# Patient Record
Sex: Male | Born: 1955 | Race: White | Hispanic: No | Marital: Married | Smoking: Never smoker
Health system: Southern US, Community
[De-identification: ages and names within clinical notes are randomized; demographics above are authoritative.]

## PROBLEM LIST (undated history)

## (undated) DIAGNOSIS — I82409 Acute embolism and thrombosis of unspecified deep veins of unspecified lower extremity: Secondary | ICD-10-CM

## (undated) DIAGNOSIS — N183 Chronic kidney disease, stage 3 unspecified: Secondary | ICD-10-CM

## (undated) DIAGNOSIS — I10 Essential (primary) hypertension: Secondary | ICD-10-CM

## (undated) DIAGNOSIS — G4700 Insomnia, unspecified: Secondary | ICD-10-CM

## (undated) DIAGNOSIS — T82898A Other specified complication of vascular prosthetic devices, implants and grafts, initial encounter: Secondary | ICD-10-CM

## (undated) DIAGNOSIS — N529 Male erectile dysfunction, unspecified: Secondary | ICD-10-CM

## (undated) DIAGNOSIS — E785 Hyperlipidemia, unspecified: Secondary | ICD-10-CM

## (undated) DIAGNOSIS — E119 Type 2 diabetes mellitus without complications: Secondary | ICD-10-CM

## (undated) DIAGNOSIS — D649 Anemia, unspecified: Secondary | ICD-10-CM

## (undated) DIAGNOSIS — N186 End stage renal disease: Secondary | ICD-10-CM

## (undated) DIAGNOSIS — H269 Unspecified cataract: Secondary | ICD-10-CM

## (undated) DIAGNOSIS — E669 Obesity, unspecified: Secondary | ICD-10-CM

## (undated) DIAGNOSIS — F32A Depression, unspecified: Secondary | ICD-10-CM

## (undated) HISTORY — DX: Essential (primary) hypertension: I10

## (undated) HISTORY — DX: Obesity, unspecified: E66.9

## (undated) HISTORY — DX: Chronic kidney disease, stage 3 unspecified: N18.30

## (undated) HISTORY — DX: Male erectile dysfunction, unspecified: N52.9

## (undated) HISTORY — PX: SPINE SURGERY: SHX786

## (undated) HISTORY — DX: Type 2 diabetes mellitus without complications: E11.9

## (undated) HISTORY — DX: Chronic kidney disease, stage 3 (moderate): N18.3

## (undated) HISTORY — DX: Insomnia, unspecified: G47.00

## (undated) HISTORY — DX: Unspecified cataract: H26.9

## (undated) HISTORY — DX: Hyperlipidemia, unspecified: E78.5

---

## 2000-03-29 ENCOUNTER — Encounter (INDEPENDENT_AMBULATORY_CARE_PROVIDER_SITE_OTHER): Payer: Self-pay | Admitting: *Deleted

## 2000-03-29 ENCOUNTER — Ambulatory Visit (HOSPITAL_COMMUNITY): Admission: RE | Admit: 2000-03-29 | Discharge: 2000-03-29 | Payer: Self-pay | Admitting: *Deleted

## 2001-06-04 ENCOUNTER — Encounter: Admission: RE | Admit: 2001-06-04 | Discharge: 2001-07-06 | Payer: Self-pay | Admitting: Orthopedic Surgery

## 2001-09-03 ENCOUNTER — Encounter: Admission: RE | Admit: 2001-09-03 | Discharge: 2001-09-24 | Payer: Self-pay | Admitting: Orthopedic Surgery

## 2002-01-31 HISTORY — PX: KNEE SURGERY: SHX244

## 2005-07-20 ENCOUNTER — Ambulatory Visit: Payer: Self-pay | Admitting: Cardiology

## 2005-07-26 ENCOUNTER — Ambulatory Visit: Payer: Self-pay

## 2007-04-26 ENCOUNTER — Ambulatory Visit (HOSPITAL_COMMUNITY): Admission: RE | Admit: 2007-04-26 | Discharge: 2007-04-26 | Payer: Self-pay | Admitting: Ophthalmology

## 2008-01-17 ENCOUNTER — Ambulatory Visit (HOSPITAL_COMMUNITY): Admission: RE | Admit: 2008-01-17 | Discharge: 2008-01-17 | Payer: Self-pay | Admitting: Ophthalmology

## 2009-03-05 ENCOUNTER — Observation Stay (HOSPITAL_COMMUNITY): Admission: EM | Admit: 2009-03-05 | Discharge: 2009-03-06 | Payer: Self-pay | Admitting: Emergency Medicine

## 2009-03-20 ENCOUNTER — Encounter: Admission: RE | Admit: 2009-03-20 | Discharge: 2009-03-20 | Payer: Self-pay | Admitting: Neurological Surgery

## 2009-04-09 ENCOUNTER — Encounter (INDEPENDENT_AMBULATORY_CARE_PROVIDER_SITE_OTHER): Payer: Self-pay | Admitting: Neurological Surgery

## 2009-04-09 ENCOUNTER — Ambulatory Visit (HOSPITAL_COMMUNITY): Admission: RE | Admit: 2009-04-09 | Discharge: 2009-04-10 | Payer: Self-pay | Admitting: Neurological Surgery

## 2009-05-18 ENCOUNTER — Encounter: Admission: RE | Admit: 2009-05-18 | Discharge: 2009-05-18 | Payer: Self-pay | Admitting: Neurological Surgery

## 2009-07-13 ENCOUNTER — Encounter: Admission: RE | Admit: 2009-07-13 | Discharge: 2009-07-13 | Payer: Self-pay | Admitting: Neurological Surgery

## 2009-09-30 ENCOUNTER — Encounter
Admission: RE | Admit: 2009-09-30 | Discharge: 2009-12-29 | Payer: Self-pay | Source: Home / Self Care | Admitting: Neurological Surgery

## 2009-11-16 ENCOUNTER — Encounter: Admission: RE | Admit: 2009-11-16 | Discharge: 2009-11-16 | Payer: Self-pay | Admitting: Neurological Surgery

## 2010-04-26 LAB — CBC
MCHC: 34.2 g/dL (ref 30.0–36.0)
MCV: 85.9 fL (ref 78.0–100.0)
Platelets: 293 10*3/uL (ref 150–400)
RDW: 14.1 % (ref 11.5–15.5)

## 2010-04-26 LAB — SURGICAL PCR SCREEN
MRSA, PCR: NEGATIVE
Staphylococcus aureus: NEGATIVE

## 2010-04-26 LAB — COMPREHENSIVE METABOLIC PANEL
AST: 23 U/L (ref 0–37)
Albumin: 3.9 g/dL (ref 3.5–5.2)
BUN: 18 mg/dL (ref 6–23)
Calcium: 9.3 mg/dL (ref 8.4–10.5)
Creatinine, Ser: 1.22 mg/dL (ref 0.4–1.5)
GFR calc Af Amer: >60 mL/min (ref >60)

## 2010-04-26 LAB — GLUCOSE, CAPILLARY
Glucose-Capillary: 205 mg/dL — ABNORMAL HIGH (ref 70–99)
Glucose-Capillary: 206 mg/dL — ABNORMAL HIGH (ref 70–99)
Glucose-Capillary: 255 mg/dL — ABNORMAL HIGH (ref 70–99)

## 2010-04-26 LAB — DIFFERENTIAL
Eosinophils Relative: 3 % (ref 0–5)
Lymphocytes Relative: 19 % (ref 12–46)
Lymphs Abs: 1.7 10*3/uL (ref 0.7–4.0)
Monocytes Absolute: 0.9 10*3/uL (ref 0.1–1.0)
Neutro Abs: 6 10*3/uL (ref 1.7–7.7)

## 2010-04-26 LAB — PROTIME-INR: Prothrombin Time: 13.6 seconds (ref 11.6–15.2)

## 2010-04-26 LAB — TYPE AND SCREEN: Antibody Screen: NEGATIVE

## 2010-04-26 LAB — APTT: aPTT: 29 seconds (ref 24–37)

## 2010-06-11 ENCOUNTER — Other Ambulatory Visit: Payer: Self-pay | Admitting: Gastroenterology

## 2010-10-25 LAB — BASIC METABOLIC PANEL
BUN: 18
CO2: 28
Chloride: 103
GFR calc non Af Amer: >60
Glucose, Bld: 127 — ABNORMAL HIGH
Potassium: 3.9
Sodium: 138

## 2010-11-05 LAB — BASIC METABOLIC PANEL
CO2: 28 mEq/L (ref 19–32)
Calcium: 9.9 mg/dL (ref 8.4–10.5)
Chloride: 105 mEq/L (ref 96–112)
GFR calc Af Amer: >60 mL/min (ref >60)
Glucose, Bld: 97 mg/dL (ref 70–99)
Potassium: 4.6 mEq/L (ref 3.5–5.1)
Sodium: 140 mEq/L (ref 135–145)

## 2010-11-05 LAB — HEMOGLOBIN AND HEMATOCRIT, BLOOD: HCT: 32 % — ABNORMAL LOW (ref 39.0–52.0)

## 2010-11-05 LAB — GLUCOSE, CAPILLARY: Glucose-Capillary: 153 mg/dL — ABNORMAL HIGH (ref 70–99)

## 2011-12-21 IMAGING — CR DG LUMBAR SPINE 2-3V
3 series · 3 of 3 positions shown · non-contrast
Comparison: 07/13/2009.

CLINICAL DATA: Low back pain.  Follow-up lumbar surgery.

LUMBAR SPINE - 2-3 VIEW

[t l-spine a.p. *]
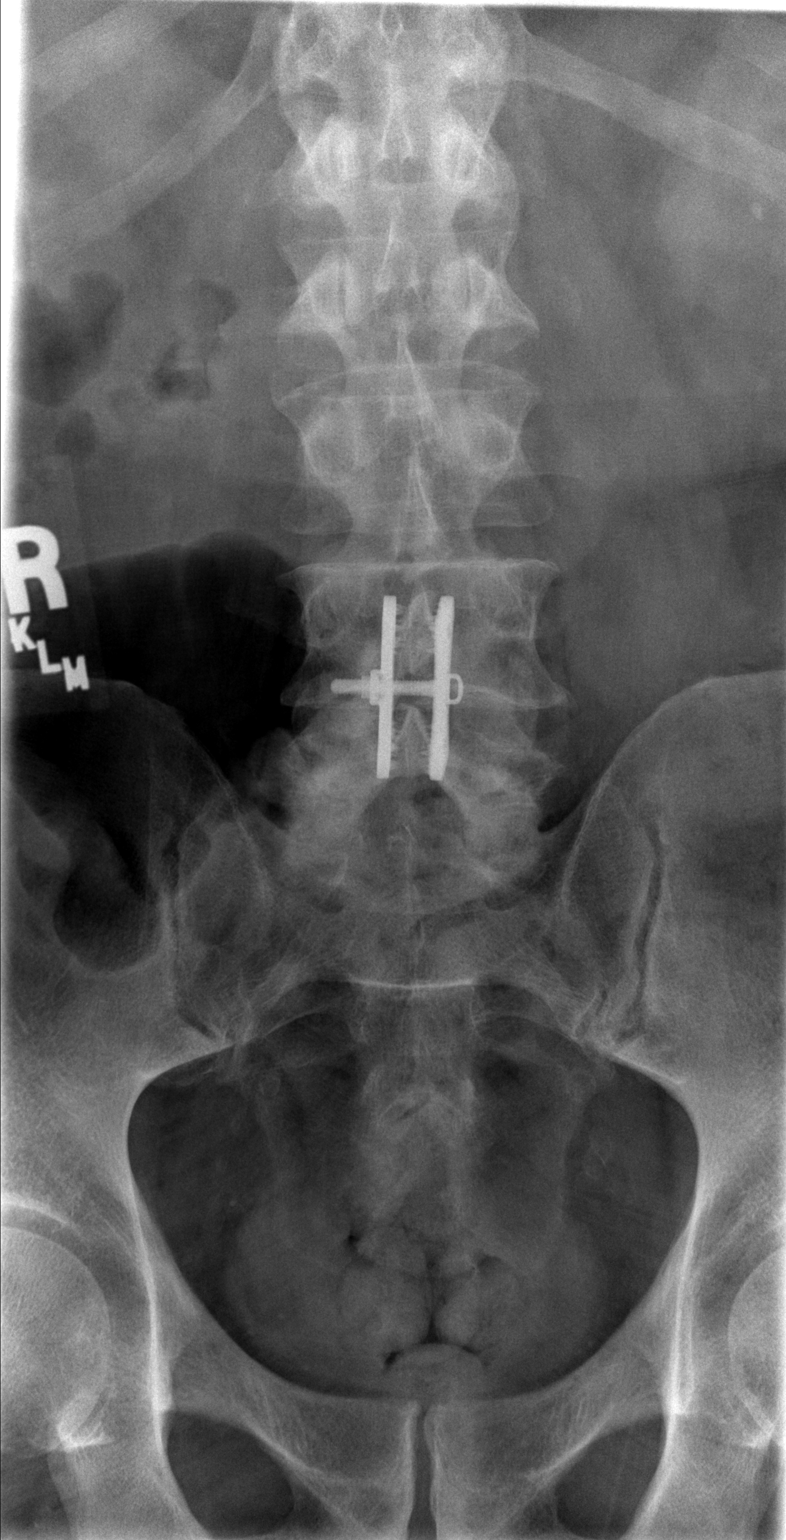

[t l-spine lat *]
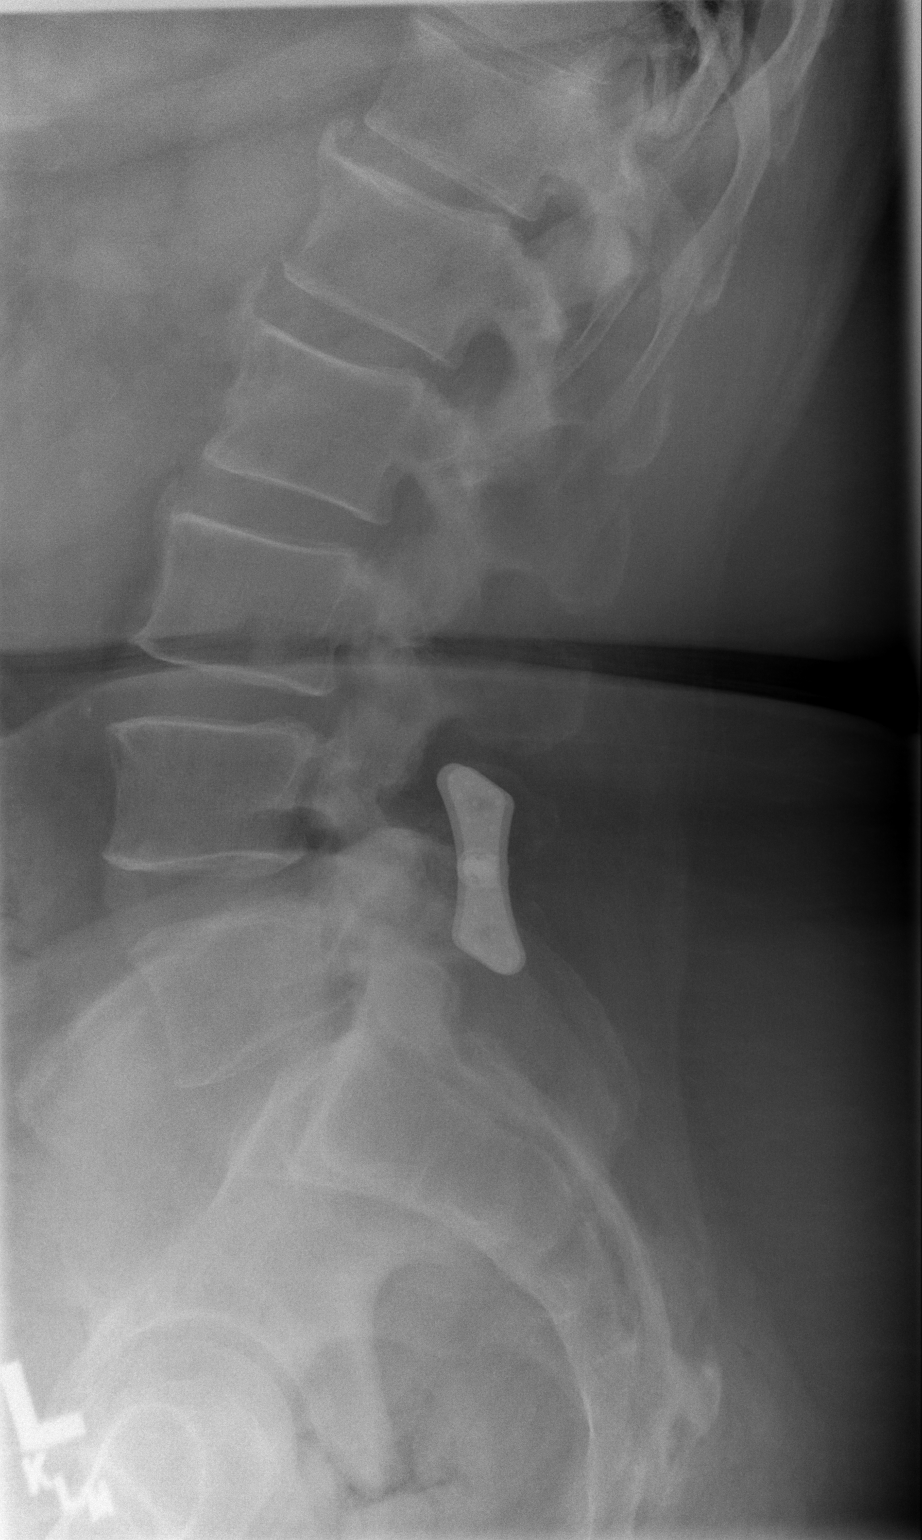

[t l-spine l5-s1 spot *]
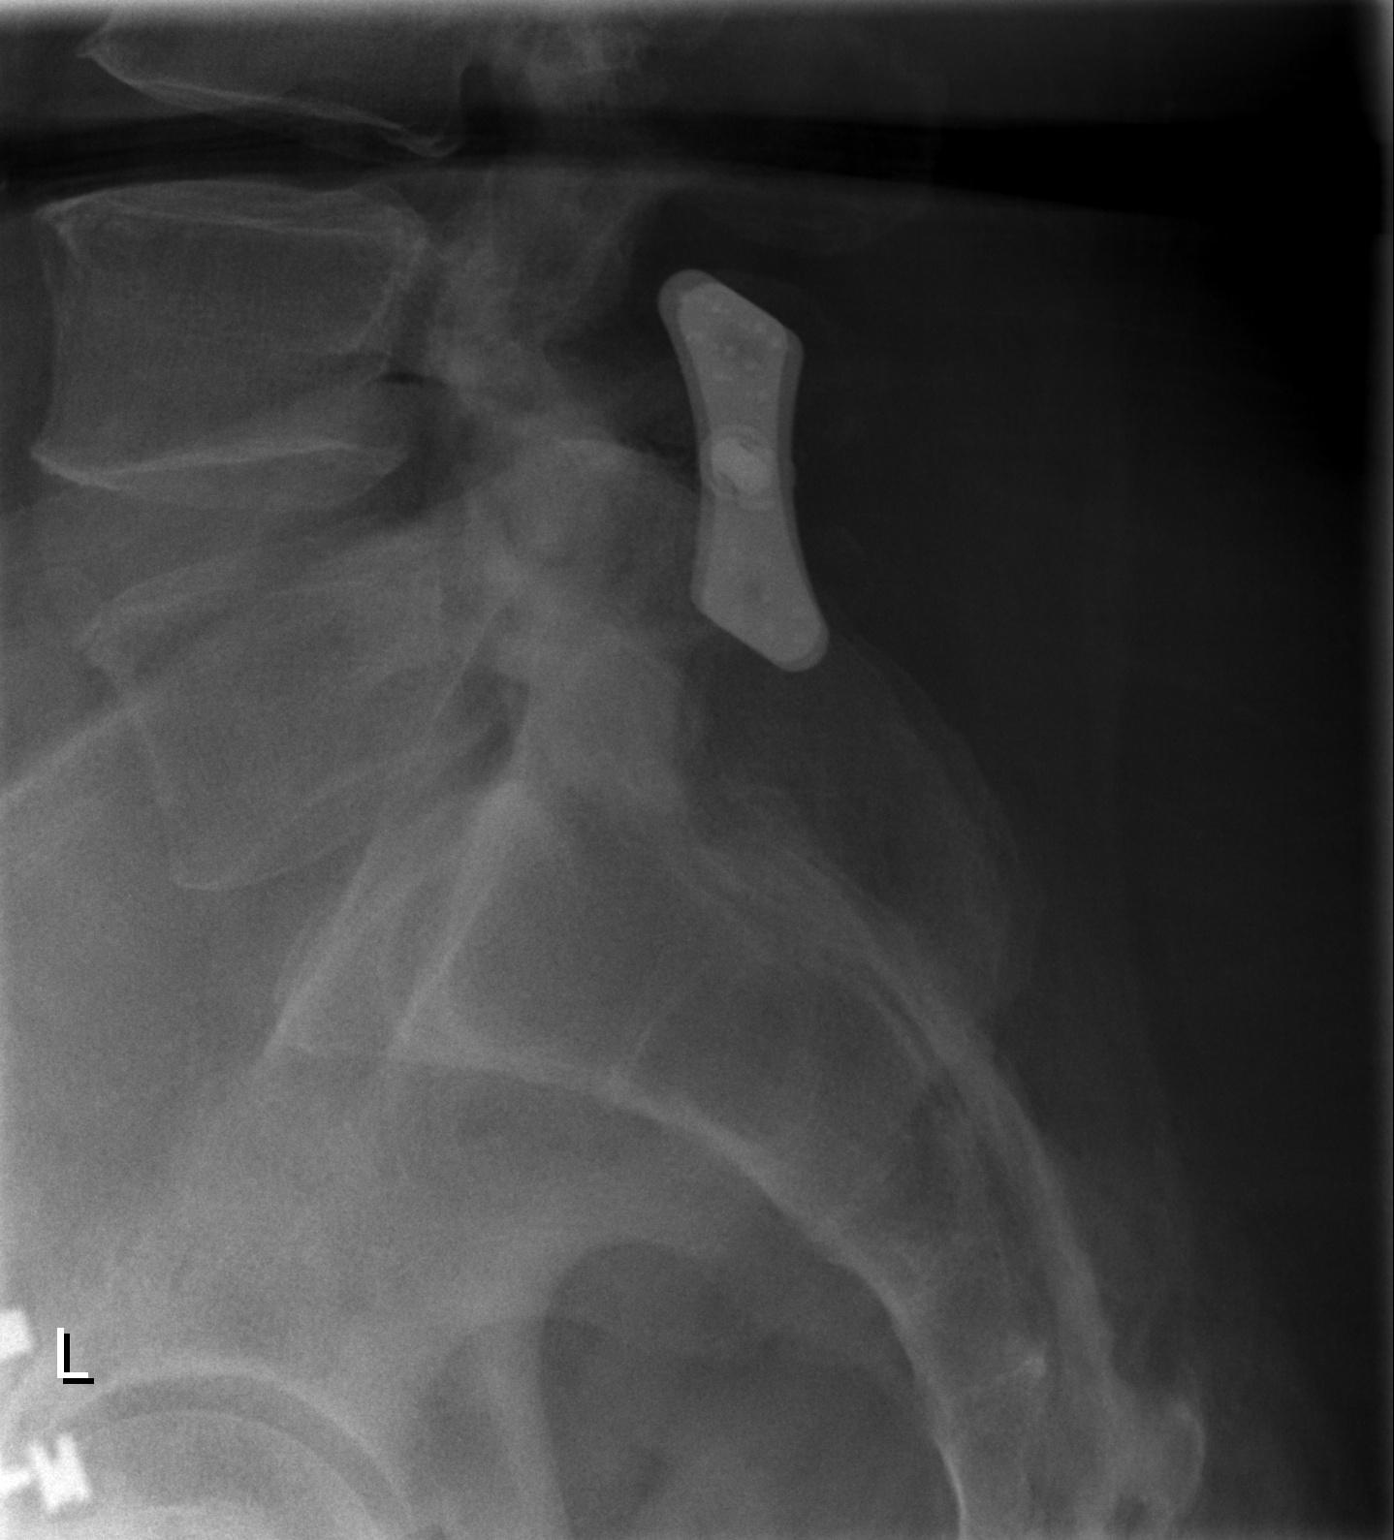

[3 of 3 positions shown; findings below may reference images not displayed]

FINDINGS: Again seen is the posterior fusion at the L4-5 level with
X- spot device present associated with the spinous processes at
this level.  The hardware appears intact and in satisfactory
position.  Alignment appears satisfactory and unchanged.

There is a small (4 mm) left lower pole left renal calculus
present.
IMPRESSION: Stable appearance postoperatively as discussed above.  Small (4 mm)
lower pole left renal calculus.

## 2012-07-09 ENCOUNTER — Other Ambulatory Visit: Payer: Self-pay | Admitting: *Deleted

## 2012-07-09 MED ORDER — CYCLOBENZAPRINE HCL 10 MG PO TABS: ORAL_TABLET | ORAL | Status: DC

## 2012-07-09 NOTE — Telephone Encounter (Signed)
LAST RF 05/03/12. THANKS.

## 2012-08-06 ENCOUNTER — Encounter: Payer: Self-pay | Admitting: Family Medicine

## 2012-08-06 ENCOUNTER — Ambulatory Visit (INDEPENDENT_AMBULATORY_CARE_PROVIDER_SITE_OTHER): Payer: PRIVATE HEALTH INSURANCE | Admitting: Family Medicine

## 2012-08-06 VITALS — BP 136/68 | HR 67 | Temp 98.0°F | Ht 71.0 in | Wt 341.2 lb

## 2012-08-06 DIAGNOSIS — N189 Chronic kidney disease, unspecified: Secondary | ICD-10-CM | POA: Insufficient documentation

## 2012-08-06 DIAGNOSIS — G4700 Insomnia, unspecified: Secondary | ICD-10-CM | POA: Insufficient documentation

## 2012-08-06 DIAGNOSIS — R635 Abnormal weight gain: Secondary | ICD-10-CM

## 2012-08-06 DIAGNOSIS — I10 Essential (primary) hypertension: Secondary | ICD-10-CM | POA: Insufficient documentation

## 2012-08-06 DIAGNOSIS — M549 Dorsalgia, unspecified: Secondary | ICD-10-CM

## 2012-08-06 DIAGNOSIS — E785 Hyperlipidemia, unspecified: Secondary | ICD-10-CM | POA: Insufficient documentation

## 2012-08-06 DIAGNOSIS — G8929 Other chronic pain: Secondary | ICD-10-CM

## 2012-08-06 DIAGNOSIS — M5380 Other specified dorsopathies, site unspecified: Secondary | ICD-10-CM

## 2012-08-06 DIAGNOSIS — E11319 Type 2 diabetes mellitus with unspecified diabetic retinopathy without macular edema: Secondary | ICD-10-CM | POA: Insufficient documentation

## 2012-08-06 DIAGNOSIS — E1339 Other specified diabetes mellitus with other diabetic ophthalmic complication: Secondary | ICD-10-CM

## 2012-08-06 DIAGNOSIS — M62830 Muscle spasm of back: Secondary | ICD-10-CM

## 2012-08-06 DIAGNOSIS — E119 Type 2 diabetes mellitus without complications: Secondary | ICD-10-CM | POA: Insufficient documentation

## 2012-08-06 DIAGNOSIS — N529 Male erectile dysfunction, unspecified: Secondary | ICD-10-CM

## 2012-08-06 LAB — COMPLETE METABOLIC PANEL WITH GFR
ALT: 13 U/L (ref 0–53)
AST: 14 U/L (ref 0–37)
Albumin: 4.3 g/dL (ref 3.5–5.2)
Alkaline Phosphatase: 47 U/L (ref 39–117)
BUN: 48 mg/dL — ABNORMAL HIGH (ref 6–23)
CO2: 28 mEq/L (ref 19–32)
Calcium: 9.2 mg/dL (ref 8.4–10.5)
Chloride: 106 mEq/L (ref 96–112)
Creat: 2.15 mg/dL — ABNORMAL HIGH (ref 0.50–1.35)
GFR, Est African American: 38 mL/min — ABNORMAL LOW
GFR, Est Non African American: 33 mL/min — ABNORMAL LOW
Glucose, Bld: 99 mg/dL (ref 70–99)
Potassium: 4.7 mEq/L (ref 3.5–5.3)
Sodium: 142 mEq/L (ref 135–145)
Total Bilirubin: 0.3 mg/dL (ref 0.3–1.2)
Total Protein: 7.4 g/dL (ref 6.0–8.3)

## 2012-08-06 LAB — POCT UA - MICROALBUMIN: Microalbumin Ur, POC: NEGATIVE mg/L

## 2012-08-06 LAB — POCT GLYCOSYLATED HEMOGLOBIN (HGB A1C): Hemoglobin A1C: 7.4

## 2012-08-06 MED ORDER — VALSARTAN 320 MG PO TABS: 320.0000 mg | ORAL_TABLET | Freq: Every day | ORAL | Status: DC

## 2012-08-06 MED ORDER — LIRAGLUTIDE 18 MG/3ML ~~LOC~~ SOPN: 1.8000 ug | PEN_INJECTOR | Freq: Every day | SUBCUTANEOUS | Status: DC

## 2012-08-06 MED ORDER — FENOFIBRATE 145 MG PO TABS: 145.0000 mg | ORAL_TABLET | Freq: Every day | ORAL | Status: DC

## 2012-08-06 MED ORDER — NIACIN 500 MG PO TABS: 500.0000 mg | ORAL_TABLET | Freq: Every day | ORAL | Status: DC

## 2012-08-06 MED ORDER — CYCLOBENZAPRINE HCL 10 MG PO TABS: ORAL_TABLET | ORAL | Status: DC

## 2012-08-06 MED ORDER — FUROSEMIDE 80 MG PO TABS: 80.0000 mg | ORAL_TABLET | Freq: Every day | ORAL | Status: DC

## 2012-08-06 MED ORDER — AMLODIPINE BESYLATE 10 MG PO TABS: 10.0000 mg | ORAL_TABLET | Freq: Every day | ORAL | Status: DC

## 2012-08-06 MED ORDER — ZOLPIDEM TARTRATE 10 MG PO TABS: 10.0000 mg | ORAL_TABLET | Freq: Every evening | ORAL | Status: DC | PRN

## 2012-08-06 MED ORDER — CARVEDILOL PHOSPHATE ER 80 MG PO CP24: 80.0000 mg | ORAL_CAPSULE | Freq: Every day | ORAL | Status: DC

## 2012-08-06 MED ORDER — ATORVASTATIN CALCIUM 10 MG PO TABS: 10.0000 mg | ORAL_TABLET | Freq: Every day | ORAL | Status: DC

## 2012-08-06 NOTE — Patient Instructions (Addendum)
Dr Paula Libra Recommendations  Diet and Exercise discussed with patient.  For nutrition information, I recommend books:  1).Eat to Live by Dr Excell Seltzer. 2).Prevent and Reverse Heart Disease by Dr Karl Luke. 3) Dr Janene Harvey Book:  Program to Reverse Diabetes  Exercise recommendations are:  If unable to walk, then the patient can exercise in a chair 3 times a day. By flapping arms like a bird gently and raising legs outwards to the front.  If ambulatory, the patient can go for walks for 30 minutes 3 times a week. Then increase the intensity and duration as tolerated.  Goal is to try to attain exercise frequency to 5 times a week.  If applicable: Best to perform resistance exercises (machines or weights) 2 days a week and cardio type exercises 3 days per week.  Diabetes and Foot Care Diabetes may cause you to have a poor blood supply (circulation) to your legs and feet. Because of this, the skin may be thinner, break easier, and heal more slowly. You also may have nerve damage in your legs and feet causing decreased feeling. You may not notice minor injuries to your feet that could lead to serious problems or infections. Taking care of your feet is one of the most important things you can do for yourself.  HOME CARE INSTRUCTIONS  Do not go barefoot. Bare feet are easily injured.  Check your feet daily for blisters, cuts, and redness.  Wash your feet with warm water (not hot) and mild soap. Pat your feet and between your toes until completely dry.  Apply a moisturizing lotion that does not contain alcohol or petroleum jelly to the dry skin on your feet and to dry brittle toenails. Do not put it between your toes.  Trim your toenails straight across. Do not dig under them or around the cuticle.  Do not cut corns or calluses, or try to remove them with medicine.  Wear clean cotton socks or stockings every day. Make sure they are not too tight. Do not wear knee  high stockings since they may decrease blood flow to your legs.  Wear leather shoes that fit properly and have enough cushioning. To break in new shoes, wear them just a few hours a day to avoid injuring your feet.  Wear shoes at all times, even in the house.  Do not cross your legs. This may decrease the blood flow to your feet.  If you find a minor scrape, cut, or break in the skin on your feet, keep it and the skin around it clean and dry. These areas may be cleansed with mild soap and water. Do not use peroxide, alcohol, iodine or Merthiolate.  When you remove an adhesive bandage, be sure not to harm the skin around it.  If you have a wound, look at it several times a day to make sure it is healing.  Do not use heating pads or hot water bottles. Burns can occur. If you have lost feeling in your feet or legs, you may not know it is happening until it is too late.  Report any cuts, sores or bruises to your caregiver. Do not wait! SEEK MEDICAL CARE IF:   You have an injury that is not healing or you notice redness, numbness, burning, or tingling.  Your feet always feel cold.  You have pain or cramps in your legs and feet. SEEK IMMEDIATE MEDICAL CARE IF:   There is increasing redness, swelling, or  increasing pain in the wound.  There is a red line that goes up your leg.  Pus is coming from a wound.  You develop an unexplained oral temperature above 102 F (38.9 C), or as your caregiver suggests.  You notice a bad smell coming from an ulcer or wound. MAKE SURE YOU:   Understand these instructions.  Will watch your condition.  Will get help right away if you are not doing well or get worse. Document Released: 01/15/2000 Document Revised: 04/11/2011 Document Reviewed: 07/23/2008 [facility] Patient Information 2014 [place], [place].

## 2012-08-06 NOTE — Progress Notes (Signed)
Patient ID: Timothy Holloway, male   DOB: 02-Jul-1955, 57 y.o.   MRN: [id number] SUBJECTIVE: CC: Chief Complaint  Patient presents with  . Follow-up    6 month follow up diabetes  . Medication Refill    refills needed     HPI: Patient is here for follow up of Diabetes Mellitus: Symptoms of DM: Denies Nocturia ,Denies Urinary Frequency , denies Blurred vision ,deniesDizziness,denies.Dysuria,denies paresthesias, denies extremity pain or ulcers.Marland Kitchendenies chest pain. has had an annual eye exam.has mild DM retinopathy do check the feet. Does check CBGs. Average CBG:100-130 Denies episodes of hypoglycemia. Does have an emergency hypoglycemic plan. admits toCompliance with medications. Denies Problems with medications.  Breakfast: 2 eggs,tortilla wrap, OJ small cup Lunch: salad with dressing, sandwich Dinner: variety: spaghetti, hot dogs.  Past Medical History  Diagnosis Date  . Diabetes mellitus without complication   . Impotence   . Hypertension   . Insomnia   . Hyperlipidemia   . Chronic kidney disease (CKD), stage III (moderate)     dr Buddy Duty  . Cataract   . Obesity    Past Surgical History  Procedure Laterality Date  . Spine surgery      fusion of L4-L5  . Knee surgery Left 01-31-2002   History   Social History  . Marital Status: Married    Spouse Name: N/A    Number of Children: N/A  . Years of Education: N/A   Occupational History  . Not on file.   Social History Main Topics  . Smoking status: Never Smoker   . Smokeless tobacco: Not on file  . Alcohol Use: No  . Drug Use: No  . Sexually Active: Yes     Comment: not much   Other Topics Concern  . Not on file   Social History Narrative  . No narrative on file   Family History  Problem Relation Age of Onset  . Diabetes Mother   . Cancer Father     lymphoma   Current Outpatient Prescriptions on File Prior to Visit  Medication Sig Dispense Refill  . cyclobenzaprine (FLEXERIL) 10 MG tablet TAKE ONE  TABLET BY MOUTH AT BEDTIME  30 tablet  1   No current facility-administered medications on file prior to visit.   Allergies  Allergen Reactions  . Lodine (Etodolac)     Nausea/vomiting   Immunization History  Administered Date(s) Administered  . Pneumococcal Polysaccharide 03/31/2012  . Tdap 01/07/2012   Prior to Admission medications   Medication Sig Start Date End Date Taking? Authorizing Provider  amLODipine (NORVASC) 10 MG tablet Take 10 mg by mouth daily.   Yes Historical Provider, MD  aspirin 81 MG tablet Take 81 mg by mouth daily.   Yes Historical Provider, MD  atorvastatin (LIPITOR) 10 MG tablet Take 10 mg by mouth daily.   Yes Historical Provider, MD  carvedilol (COREG CR) 80 MG 24 hr capsule Take 80 mg by mouth daily.   Yes Historical Provider, MD  Cholecalciferol (VITAMIN D) 2000 UNITS CAPS Take 1 capsule by mouth daily.   Yes Historical Provider, MD  cyclobenzaprine (FLEXERIL) 10 MG tablet TAKE ONE TABLET BY MOUTH AT BEDTIME 07/09/12  Yes Lodema Pilot, PA-C  fenofibrate (TRICOR) 145 MG tablet Take 145 mg by mouth daily.   Yes Historical Provider, MD  Ferrous Sulfate (IRON) 325 (65 FE) MG TABS Take 1 tablet by mouth daily.   Yes Historical Provider, MD  ferrous sulfate 325 (65 FE) MG tablet Take 325 mg by mouth  daily with breakfast.   Yes Historical Provider, MD  furosemide (LASIX) 80 MG tablet Take 80 mg by mouth.   Yes Historical Provider, MD  insulin glargine (LANTUS) 100 UNIT/ML injection Inject 80 Units into the skin at bedtime. Dr Buddy Duty   Yes Historical Provider, MD  insulin lispro (HUMALOG) 100 UNIT/ML injection Inject 24 Units into the skin 3 (three) times daily before meals. Dr Buddy Duty   Yes Historical Provider, MD  Liraglutide (VICTOZA) 18 MG/3ML SOPN Inject 1.8 mcg into the skin daily.   Yes Historical Provider, MD  Multiple Vitamin (MULTIVITAMIN) tablet Take 1 tablet by mouth daily.   Yes Historical Provider, MD  niacin 500 MG tablet Take 500 mg by mouth at  bedtime.   Yes Historical Provider, MD  valsartan (DIOVAN) 320 MG tablet Take 320 mg by mouth daily.   Yes Historical Provider, MD  zolpidem (AMBIEN) 10 MG tablet Take 10 mg by mouth at bedtime as needed for sleep.   Yes Historical Provider, MD    ROS: As above in the HPI. All other systems are stable or negative.  OBJECTIVE: APPEARANCE:  Patient in no acute distress.The patient appeared well nourished and normally developed. Acyanotic. Waist:56 inches VITAL SIGNS:BP 136/68  Pulse 67  Temp(Src) 98 F (36.7 C) (Oral)  Ht 5\' 11"  (1.803 m)  Wt 341 lb 3.2 oz (154.767 kg)  BMI 47.61 kg/m2 WM morbidly obese.  SKIN: warm and  Dry without overt rashes, tattoos and scars  HEAD and Neck: without JVD, Head and scalp: normal Eyes:No scleral icterus. Fundi normal, eye movements normal. Ears: Auricle normal, canal normal, Tympanic membranes normal, insufflation normal. Nose: normal Throat: normal Neck & thyroid: normal  CHEST & LUNGS: Chest wall: normal Lungs: Clear  CVS: Reveals the PMI to be normally located. Regular rhythm, First and Second Heart sounds are normal,  absence of murmurs, rubs or gallops. Peripheral vasculature: Radial pulses: normal Dorsal pedis pulses: normal Posterior pulses: normal  ABDOMEN:  Appearance:obese Benign, no organomegaly, no masses, no Abdominal Aortic enlargement. No Guarding , no rebound. No Bruits. Bowel sounds: normal  RECTAL: N/A GU: N/A  EXTREMETIES: nonedematous. Both Femoral and Pedal pulses are normal.  MUSCULOSKELETAL:  Spine:decreased ROM Joints: intact  NEUROLOGIC: oriented to time,place and person; nonfocal. Strength is normal Sensory is normal Reflexes are normal Cranial Nerves are normal.  ASSESSMENT: Diabetes mellitus - Plan: POCT glycosylated hemoglobin (Hb A1C), POCT UA - Microalbumin, COMPLETE METABOLIC PANEL WITH GFR, Liraglutide (VICTOZA) 18 MG/3ML SOPN  Diabetic retinopathy, due to underlying condition,  without macular edema, with unspecified retinopathy - Plan: COMPLETE METABOLIC PANEL WITH GFR, Liraglutide (VICTOZA) 18 MG/3ML SOPN  HTN (hypertension) - Plan: COMPLETE METABOLIC PANEL WITH GFR, amLODipine (NORVASC) 10 MG tablet, carvedilol (COREG CR) 80 MG 24 hr capsule, furosemide (LASIX) 80 MG tablet, valsartan (DIOVAN) 320 MG tablet  HLD (hyperlipidemia) - Plan: COMPLETE METABOLIC PANEL WITH GFR, NMR Lipoprofile with Lipids, atorvastatin (LIPITOR) 10 MG tablet, fenofibrate (TRICOR) 145 MG tablet, niacin 500 MG tablet  Morbid obesity  Erectile dysfunction  Insomnia - Plan: zolpidem (AMBIEN) 10 MG tablet  Muscle spasm of back - Plan: cyclobenzaprine (FLEXERIL) 10 MG tablet  Chronic back pain - Plan: cyclobenzaprine (FLEXERIL) 10 MG tablet  CKD (chronic kidney disease), unspecified stage - Plan: COMPLETE METABOLIC PANEL WITH GFR, furosemide (LASIX) 80 MG tablet   PLAN: Orders Placed This Encounter  Procedures  . COMPLETE METABOLIC PANEL WITH GFR  . NMR Lipoprofile with Lipids  . POCT glycosylated hemoglobin (Hb A1C)  .  POCT UA - Microalbumin   Meds ordered this encounter  Medications  . DISCONTD: zolpidem (AMBIEN) 10 MG tablet    Sig: Take 10 mg by mouth at bedtime as needed for sleep.  Marland Kitchen DISCONTD: niacin 500 MG tablet    Sig: Take 500 mg by mouth at bedtime.  Marland Kitchen DISCONTD: fenofibrate (TRICOR) 145 MG tablet    Sig: Take 145 mg by mouth daily.  Marland Kitchen DISCONTD: carvedilol (COREG CR) 80 MG 24 hr capsule    Sig: Take 80 mg by mouth daily.  Marland Kitchen DISCONTD: atorvastatin (LIPITOR) 10 MG tablet    Sig: Take 10 mg by mouth daily.  Marland Kitchen DISCONTD: valsartan (DIOVAN) 320 MG tablet    Sig: Take 320 mg by mouth daily.  Marland Kitchen DISCONTD: amLODipine (NORVASC) 10 MG tablet    Sig: Take 10 mg by mouth daily.  Marland Kitchen DISCONTD: furosemide (LASIX) 80 MG tablet    Sig: Take 80 mg by mouth.  . insulin lispro (HUMALOG) 100 UNIT/ML injection    Sig: Inject 24 Units into the skin 3 (three) times daily before meals. Dr  Buddy Duty  . insulin glargine (LANTUS) 100 UNIT/ML injection    Sig: Inject 80 Units into the skin at bedtime. Dr Buddy Duty  . DISCONTD: Liraglutide (VICTOZA) 18 MG/3ML SOPN    Sig: Inject 1.8 mcg into the skin daily.  . ferrous sulfate 325 (65 FE) MG tablet    Sig: Take 325 mg by mouth daily with breakfast.  . DISCONTD: ferrous sulfate 325 (65 FE) MG EC tablet    Sig: Take 325 mg by mouth 3 (three) times daily with meals.  . Ferrous Sulfate (IRON) 325 (65 FE) MG TABS    Sig: Take 1 tablet by mouth daily.  . Cholecalciferol (VITAMIN D) 2000 UNITS CAPS    Sig: Take 1 capsule by mouth daily.  . Multiple Vitamin (MULTIVITAMIN) tablet    Sig: Take 1 tablet by mouth daily.  Marland Kitchen aspirin 81 MG tablet    Sig: Take 81 mg by mouth daily.  Marland Kitchen amLODipine (NORVASC) 10 MG tablet    Sig: Take 1 tablet (10 mg total) by mouth daily.    Dispense:  30 tablet    Refill:  11  . atorvastatin (LIPITOR) 10 MG tablet    Sig: Take 1 tablet (10 mg total) by mouth daily.    Dispense:  30 tablet    Refill:  11  . carvedilol (COREG CR) 80 MG 24 hr capsule    Sig: Take 1 capsule (80 mg total) by mouth daily.    Dispense:  30 capsule    Refill:  11  . cyclobenzaprine (FLEXERIL) 10 MG tablet    Sig: TAKE ONE TABLET BY MOUTH AT BEDTIME    Dispense:  30 tablet    Refill:  1  . fenofibrate (TRICOR) 145 MG tablet    Sig: Take 1 tablet (145 mg total) by mouth daily.    Dispense:  30 tablet    Refill:  11  . furosemide (LASIX) 80 MG tablet    Sig: Take 1 tablet (80 mg total) by mouth daily.    Dispense:  30 tablet    Refill:  11  . Liraglutide (VICTOZA) 18 MG/3ML SOPN    Sig: Inject 1.8 mcg into the skin daily.    Dispense:  3 mL    Refill:  11  . niacin 500 MG tablet    Sig: Take 1 tablet (500 mg total) by mouth at bedtime.  Dispense:  30 tablet    Refill:  11  . valsartan (DIOVAN) 320 MG tablet    Sig: Take 1 tablet (320 mg total) by mouth daily.    Dispense:  30 tablet    Refill:  11  . zolpidem (AMBIEN) 10 MG  tablet    Sig: Take 1 tablet (10 mg total) by mouth at bedtime as needed for sleep.    Dispense:  30 tablet    Refill:  3        Dr Paula Libra Recommendations  Diet and Exercise discussed with patient.  For nutrition information, I recommend books:  1).Eat to Live by Dr Excell Seltzer. 2).Prevent and Reverse Heart Disease by Dr Karl Luke. 3) Dr Janene Harvey Book:  Program to Reverse Diabetes  Exercise recommendations are:  If unable to walk, then the patient can exercise in a chair 3 times a day. By flapping arms like a bird gently and raising legs outwards to the front.  If ambulatory, the patient can go for walks for 30 minutes 3 times a week. Then increase the intensity and duration as tolerated.  Goal is to try to attain exercise frequency to 5 times a week.  If applicable: Best to perform resistance exercises (machines or weights) 2 days a week and cardio type exercises 3 days per week.  Other DM handouts given in the AVS.  counselled on weight reduction and  plant based  Diet as helpful in reversing DM. Discussed latest research.  Lifestyle therapeutic  Changes  Discussed including safe  Exercise. Patient had   Denied OSA symptoms so will defer sleep study for now.  Return in about 3 months (around 11/06/2012) for Recheck medical problems.  Caroline Longie P. Jacelyn Grip, M.D.

## 2012-08-07 LAB — NMR LIPOPROFILE WITH LIPIDS
Cholesterol, Total: 106 mg/dL (ref <200)
HDL Particle Number: 34 umol/L (ref >=30.5)
HDL Size: 8.3 nm — ABNORMAL LOW (ref >=9.2)
HDL-C: 40 mg/dL (ref >=40)
LDL (calc): 48 mg/dL (ref <100)
LDL Particle Number: 644 nmol/L (ref <1000)
LDL Size: 20.3 nm — ABNORMAL LOW (ref >20.5)
LP-IR Score: 77 — ABNORMAL HIGH (ref <=45)
Large HDL-P: 1.8 umol/L — ABNORMAL LOW (ref >=4.8)
Large VLDL-P: 3.4 nmol/L — ABNORMAL HIGH (ref <=2.7)
Small LDL Particle Number: 405 nmol/L (ref <=527)
Triglycerides: 92 mg/dL (ref <150)
VLDL Size: 54.2 nm — ABNORMAL HIGH (ref <=46.6)

## 2012-09-03 ENCOUNTER — Ambulatory Visit (INDEPENDENT_AMBULATORY_CARE_PROVIDER_SITE_OTHER): Payer: PRIVATE HEALTH INSURANCE | Admitting: Pharmacist

## 2012-09-03 ENCOUNTER — Encounter: Payer: Self-pay | Admitting: Pharmacist

## 2012-09-03 VITALS — BP 134/70 | HR 70 | Ht 71.0 in | Wt 344.5 lb

## 2012-09-03 DIAGNOSIS — I10 Essential (primary) hypertension: Secondary | ICD-10-CM

## 2012-09-03 DIAGNOSIS — R7989 Other specified abnormal findings of blood chemistry: Secondary | ICD-10-CM

## 2012-09-03 DIAGNOSIS — R799 Abnormal finding of blood chemistry, unspecified: Secondary | ICD-10-CM

## 2012-09-03 DIAGNOSIS — E785 Hyperlipidemia, unspecified: Secondary | ICD-10-CM

## 2012-09-03 DIAGNOSIS — N189 Chronic kidney disease, unspecified: Secondary | ICD-10-CM

## 2012-09-03 DIAGNOSIS — E119 Type 2 diabetes mellitus without complications: Secondary | ICD-10-CM

## 2012-09-03 MED ORDER — FENOFIBRATE 145 MG PO TABS: ORAL_TABLET | ORAL | Status: DC

## 2012-09-03 NOTE — Patient Instructions (Addendum)
Discontinue Niacin  Decrease tricor 145mg  to 1/2 tablet daily.

## 2012-09-03 NOTE — Progress Notes (Signed)
Diabetes Follow-Up Visit Chief Complaint:   Chief Complaint  Patient presents with  . Diabetes  . Hyperlipidemia    elevated serum creatinine     Filed Vitals:   09/03/12 1619  BP: 134/70  Pulse: 70     HPI: patient is here to review medications and discuss type 2 DM. He had labs drawn 08/06/12 - see results below.  He has been seeing Dr Buddy Duty - endocrinologist for diabetes control and has seen improvement over the last few months.  He also sees Dr. Raymondo Band for renal insufficiency but is not due to see her again util Sept or Oct.  His most recent serum creatinine had increased to 2.15.   Current Diabetes Medications: lantus 90 units qd, humalog up to 28 unit prior to meals, Victoza 1.25mcg daily  For lipids he is taking ffenofibrate 145mg  daily, niacin 500mg  OTC daily and atorvastatin 10mg  daily.    Exam Edema:  negative  Polyuria:  negative  Polydipsia:  negative Polyphagia:  negative  BMI:  Body mass index is 48.07 kg/(m^2).   Weight changes:  stable General Appearance:  obese Mood/Affect:  normal   Low fat/carbohydrate diet?  No Nicotine Abuse?  No Medication Compliance?  Yes Exercise?  No Alcohol Abuse?  No  Home BG Monitoring:  Checking 3 times a day. Average:  140  High: 160's   Low:  29 (occurred 2 nights ago but patient states it happened because he did not check BG prior to bedtime and ate less for supper than usual)    Lab Results  Component Value Date   HGBA1C 7.4 08/06/2012    No results found for this basenameDerl Barrow    Lab Results  Component Value Date   LDLCALC 48 08/06/2012   TRIG 92 08/06/2012     Assessment: 1.  Diabetes.  Improving control 2.  Blood Pressure.  good 3.  Lipids.  At goals   Recommendations: 1.  Medication recommendations at this time are as follows:   Discontinue niacin - due to questionable benefit for CVD and possibility that it could worsen DM control  Decrease fenofibrate to 145mg  1/2 tablet  daily due to increased serum creatinine. 2.  Reviewed HBG goals:  Fasting 80-130 and 1-2 hour post prandial <180.  Patient is instructed to check BG 3 times per day.   Reviewed how to treat hypoglycemia.  If frequency of hypoglycemia increases patient is to contact our office or Dr Buddy Duty for insulin adjustment. 3.  BP goal < 140/80. 4.  LDL goal of < 100, HDL > 40 and TG < 150. 5.  Eye Exam yearly and Dental Exam every 6 months. 6.  Dietary recommendations:  Reviewed low CHO foods and serving sizes 7.  Physical Activity recommendations:  Increase as able 8.   Orders Placed This Encounter  Procedures  . BASIC METABOLIC PANEL WITH GFR     9.  Return to clinic in 4-6 wks   Time spent counseling patient:  40 minutes   Referring provider:  Jacelyn Grip

## 2012-09-04 ENCOUNTER — Telehealth: Payer: Self-pay | Admitting: Pharmacist

## 2012-09-04 ENCOUNTER — Encounter: Payer: Self-pay | Admitting: Pharmacist

## 2012-09-04 DIAGNOSIS — E875 Hyperkalemia: Secondary | ICD-10-CM

## 2012-09-04 LAB — BASIC METABOLIC PANEL WITH GFR
GFR, Est African American: 51 mL/min — ABNORMAL LOW
GFR, Est Non African American: 44 mL/min — ABNORMAL LOW
Potassium: 5.4 mEq/L — ABNORMAL HIGH (ref 3.5–5.3)
Sodium: 144 mEq/L (ref 135–145)

## 2012-09-04 NOTE — Telephone Encounter (Signed)
Patient was notified of laboratory results.  Potassium was elevated and he was instructed to avoid potassium rich foods and RTC tomorrow to recheck BMP.

## 2012-09-05 ENCOUNTER — Other Ambulatory Visit (INDEPENDENT_AMBULATORY_CARE_PROVIDER_SITE_OTHER): Payer: PRIVATE HEALTH INSURANCE

## 2012-09-05 DIAGNOSIS — E875 Hyperkalemia: Secondary | ICD-10-CM

## 2012-09-05 NOTE — Progress Notes (Signed)
Pt came in for labs only 

## 2012-09-06 ENCOUNTER — Encounter: Payer: Self-pay | Admitting: Pharmacist

## 2012-09-06 LAB — BMP8+EGFR
BUN: 48 mg/dL — ABNORMAL HIGH (ref 6–24)
CO2: 21 mmol/L (ref 18–29)
Calcium: 9.7 mg/dL (ref 8.7–10.2)
Creatinine, Ser: 1.65 mg/dL — ABNORMAL HIGH (ref 0.76–1.27)
Sodium: 142 mmol/L (ref 134–144)

## 2012-10-08 ENCOUNTER — Other Ambulatory Visit: Payer: Self-pay

## 2012-11-09 ENCOUNTER — Ambulatory Visit (INDEPENDENT_AMBULATORY_CARE_PROVIDER_SITE_OTHER): Payer: PRIVATE HEALTH INSURANCE | Admitting: Family Medicine

## 2012-11-09 ENCOUNTER — Encounter: Payer: Self-pay | Admitting: Family Medicine

## 2012-11-09 VITALS — BP 136/67 | HR 57 | Temp 98.2°F | Ht 71.0 in | Wt 334.0 lb

## 2012-11-09 DIAGNOSIS — E11319 Type 2 diabetes mellitus with unspecified diabetic retinopathy without macular edema: Secondary | ICD-10-CM

## 2012-11-09 DIAGNOSIS — E785 Hyperlipidemia, unspecified: Secondary | ICD-10-CM

## 2012-11-09 DIAGNOSIS — N189 Chronic kidney disease, unspecified: Secondary | ICD-10-CM

## 2012-11-09 DIAGNOSIS — I10 Essential (primary) hypertension: Secondary | ICD-10-CM

## 2012-11-09 DIAGNOSIS — Z23 Encounter for immunization: Secondary | ICD-10-CM

## 2012-11-09 DIAGNOSIS — E119 Type 2 diabetes mellitus without complications: Secondary | ICD-10-CM

## 2012-11-09 DIAGNOSIS — E559 Vitamin D deficiency, unspecified: Secondary | ICD-10-CM

## 2012-11-09 DIAGNOSIS — E1139 Type 2 diabetes mellitus with other diabetic ophthalmic complication: Secondary | ICD-10-CM

## 2012-11-09 DIAGNOSIS — N529 Male erectile dysfunction, unspecified: Secondary | ICD-10-CM

## 2012-11-09 LAB — POCT GLYCOSYLATED HEMOGLOBIN (HGB A1C): Hemoglobin A1C: 5.8

## 2012-11-09 NOTE — Progress Notes (Signed)
Patient ID: CUINN BECHEL, male   DOB: [birth year], 57 y.o.   MRN: [id number] SUBJECTIVE: CC: Chief Complaint  Patient presents with  . Follow-up    follow up diabetes      HPI: Patient is here for follow up of Diabetes Mellitus/obesity/htn/hld: Symptoms evaluated: Denies Nocturia ,Denies Urinary Frequency , denies Blurred vision ,deniesDizziness,denies.Dysuria,denies paresthesias, denies extremity pain or ulcers.Marland Kitchendenies chest pain. has had an annual eye exam. do check the feet. Does check CBGs. Average CBG:90s Denies episodes of hypoglycemia. Does have an emergency hypoglycemic plan. admits toCompliance with medications. Denies Problems with medications.  Past Medical History  Diagnosis Date  . Diabetes mellitus without complication   . Impotence   . Hypertension   . Insomnia   . Hyperlipidemia   . Chronic kidney disease (CKD), stage III (moderate)     dr Buddy Duty  . Cataract   . Obesity    Past Surgical History  Procedure Laterality Date  . Spine surgery      fusion of L4-L5  . Knee surgery Left 01-31-2002   History   Social History  . Marital Status: Married    Spouse Name: N/A    Number of Children: N/A  . Years of Education: N/A   Occupational History  . Not on file.   Social History Main Topics  . Smoking status: Never Smoker   . Smokeless tobacco: Not on file  . Alcohol Use: No  . Drug Use: No  . Sexual Activity: Yes     Comment: not much   Other Topics Concern  . Not on file   Social History Narrative  . No narrative on file   Family History  Problem Relation Age of Onset  . Diabetes Mother   . Cancer Father     lymphoma   Current Outpatient Prescriptions on File Prior to Visit  Medication Sig Dispense Refill  . amLODipine (NORVASC) 10 MG tablet Take 1 tablet (10 mg total) by mouth daily.  30 tablet  11  . aspirin 81 MG tablet Take 81 mg by mouth daily.      Marland Kitchen atorvastatin (LIPITOR) 10 MG tablet Take 1 tablet (10 mg total) by mouth daily.   30 tablet  11  . carvedilol (COREG CR) 80 MG 24 hr capsule Take 1 capsule (80 mg total) by mouth daily.  30 capsule  11  . Cholecalciferol (VITAMIN D) 2000 UNITS CAPS Take 1 capsule by mouth daily.      . cyclobenzaprine (FLEXERIL) 10 MG tablet TAKE ONE TABLET BY MOUTH AT BEDTIME  30 tablet  1  . fenofibrate (TRICOR) 145 MG tablet Take 1/2 tablet daily  30 tablet  11  . ferrous sulfate 325 (65 FE) MG tablet Take 325 mg by mouth daily with breakfast.      . furosemide (LASIX) 80 MG tablet Take 120 mg by mouth daily.      . insulin glargine (LANTUS) 100 UNIT/ML injection Inject 90 Units into the skin at bedtime. Dr Buddy Duty      . insulin lispro (HUMALOG) 100 UNIT/ML injection Inject 28 Units into the skin 3 (three) times daily before meals. Dr Buddy Duty      . Liraglutide (VICTOZA) 18 MG/3ML SOPN Inject 1.8 mcg into the skin daily.  3 mL  11  . Multiple Vitamin (MULTIVITAMIN) tablet Take 1 tablet by mouth daily.      . valsartan (DIOVAN) 320 MG tablet Take 1 tablet (320 mg total) by mouth daily.  30 tablet  11  . zolpidem (AMBIEN) 10 MG tablet Take 1 tablet (10 mg total) by mouth at bedtime as needed for sleep.  30 tablet  3   No current facility-administered medications on file prior to visit.   Allergies  Allergen Reactions  . Latex   . Lodine [Etodolac]     Nausea/vomiting   Immunization History  Administered Date(s) Administered  . Influenza,inj,Quad PF,36+ Mos 11/09/2012  . Pneumococcal Polysaccharide 03/31/2012  . Tdap 01/07/2012   Prior to Admission medications   Medication Sig Start Date End Date Taking? Authorizing Provider  amLODipine (NORVASC) 10 MG tablet Take 1 tablet (10 mg total) by mouth daily. 08/06/12   Vernie Shanks, MD  aspirin 81 MG tablet Take 81 mg by mouth daily.    Historical Provider, MD  atorvastatin (LIPITOR) 10 MG tablet Take 1 tablet (10 mg total) by mouth daily. 08/06/12   Vernie Shanks, MD  carvedilol (COREG CR) 80 MG 24 hr capsule Take 1 capsule (80 mg total) by  mouth daily. 08/06/12   Vernie Shanks, MD  Cholecalciferol (VITAMIN D) 2000 UNITS CAPS Take 1 capsule by mouth daily.    Historical Provider, MD  cyclobenzaprine (FLEXERIL) 10 MG tablet TAKE ONE TABLET BY MOUTH AT BEDTIME 08/06/12   Vernie Shanks, MD  fenofibrate (TRICOR) 145 MG tablet Take 1/2 tablet daily 09/03/12   Tammy Eckard, PHARMD  ferrous sulfate 325 (65 FE) MG tablet Take 325 mg by mouth daily with breakfast.    Historical Provider, MD  furosemide (LASIX) 80 MG tablet Take 120 mg by mouth daily. 08/06/12   Vernie Shanks, MD  insulin glargine (LANTUS) 100 UNIT/ML injection Inject 90 Units into the skin at bedtime. Dr Buddy Duty    Historical Provider, MD  insulin lispro (HUMALOG) 100 UNIT/ML injection Inject 28 Units into the skin 3 (three) times daily before meals. Dr Buddy Duty    Historical Provider, MD  Liraglutide (VICTOZA) 18 MG/3ML SOPN Inject 1.8 mcg into the skin daily. 08/06/12   Vernie Shanks, MD  Multiple Vitamin (MULTIVITAMIN) tablet Take 1 tablet by mouth daily.    Historical Provider, MD  valsartan (DIOVAN) 320 MG tablet Take 1 tablet (320 mg total) by mouth daily. 08/06/12   Vernie Shanks, MD  zolpidem (AMBIEN) 10 MG tablet Take 1 tablet (10 mg total) by mouth at bedtime as needed for sleep. 08/06/12   Vernie Shanks, MD    ROS: As above in the HPI. All other systems are stable or negative.  OBJECTIVE: APPEARANCE:  Patient in no acute distress.The patient appeared well nourished and normally developed. Acyanotic. Waist: VITAL SIGNS:BP 136/67  Pulse 57  Temp(Src) 98.2 F (36.8 C) (Oral)  Ht 5\' 11"  (1.803 m)  Wt 334 lb (151.501 kg)  BMI 46.6 kg/m2 WM morbidly obese Lost 10 lbs in 6 weeks of a plant based  Diet.  SKIN: warm and  Dry without overt rashes, tattoos and scars  HEAD and Neck: without JVD, Head and scalp: normal Eyes:No scleral icterus. Fundi normal, eye movements normal. Ears: Auricle normal, canal normal, Tympanic membranes normal, insufflation normal. Nose:  normal Throat: normal Neck & thyroid: normal  CHEST & LUNGS: Chest wall: normal Lungs: Clear  CVS: Reveals the PMI to be normally located. Regular rhythm, First and Second Heart sounds are normal,  absence of murmurs, rubs or gallops. Peripheral vasculature: Radial pulses: normal Dorsal pedis pulses: normal Posterior pulses: normal  ABDOMEN:  Appearance: morbidly obese Benign, no organomegaly, no masses,  no Abdominal Aortic enlargement. No Guarding , no rebound. No Bruits. Bowel sounds: normal  RECTAL: N/A GU: N/A  EXTREMETIES: nonedematous.  MUSCULOSKELETAL:  Spine: normal Joints: intact  NEUROLOGIC: oriented to time,place and person; nonfocal. Strength is normal Sensory is normal Reflexes are normal Cranial Nerves are normal. Results for orders placed in visit on 11/09/12  POCT GLYCOSYLATED HEMOGLOBIN (HGB A1C)      Result Value Range   Hemoglobin A1C 5.8       ASSESSMENT: Need for prophylactic vaccination and inoculation against influenza  CKD (chronic kidney disease)  Diabetes mellitus - Plan: CMP14+EGFR, POCT glycosylated hemoglobin (Hb A1C)  Diabetic retinopathy  Erectile dysfunction  HLD (hyperlipidemia) - Plan: CMP14+EGFR, NMR, lipoprofile  HTN (hypertension) - Plan: CMP14+EGFR  Morbid obesity  Unspecified vitamin D deficiency - Plan: Vit D  25 hydroxy (rtn osteoporosis monitoring) doing better with a plant based diet.  PLAN: Orders Placed This Encounter  Procedures  . CMP14+EGFR  . NMR, lipoprofile  . Vit D  25 hydroxy (rtn osteoporosis monitoring)  . POCT glycosylated hemoglobin (Hb A1C)   Reviewed the reading materials that I had recommended and exercise.      Dr Paula Libra Recommendations  For nutrition information, I recommend books:  1).Eat to Live by Dr Excell Seltzer. 2).Prevent and Reverse Heart Disease by Dr Karl Luke. 3) Dr Janene Harvey Book:  Program to Reverse Diabetes  Exercise recommendations are:   If unable to walk, then the patient can exercise in a chair 3 times a day. By flapping arms like a bird gently and raising legs outwards to the front.  If ambulatory, the patient can go for walks for 30 minutes 3 times a week. Then increase the intensity and duration as tolerated.  Goal is to try to attain exercise frequency to 5 times a week.  If applicable: Best to perform resistance exercises (machines or weights) 2 days a week and cardio type exercises 3 days per week.  Patient has embarked on a plant based  Diet and has dramatically improved.  Praised patient  Return in about 2 months (around 01/09/2013) for PEX.  Zenita Kister P. Jacelyn Grip, M.D.

## 2012-11-11 LAB — CMP14+EGFR
ALT: 14 IU/L (ref 0–44)
AST: 13 IU/L (ref 0–40)
Albumin/Globulin Ratio: 1.6 (ref 1.1–2.5)
Albumin: 4.1 g/dL (ref 3.5–5.5)
Alkaline Phosphatase: 73 IU/L (ref 39–117)
BUN/Creatinine Ratio: 22 — ABNORMAL HIGH (ref 9–20)
BUN: 31 mg/dL — ABNORMAL HIGH (ref 6–24)
CO2: 26 mmol/L (ref 18–29)
Calcium: 9.6 mg/dL (ref 8.7–10.2)
Chloride: 103 mmol/L (ref 97–108)
Creatinine, Ser: 1.39 mg/dL — ABNORMAL HIGH (ref 0.76–1.27)
GFR calc Af Amer: 64 mL/min/{1.73_m2} (ref >59)
GFR calc non Af Amer: 55 mL/min/{1.73_m2} — ABNORMAL LOW (ref >59)
Globulin, Total: 2.5 g/dL (ref 1.5–4.5)
Glucose: 79 mg/dL (ref 65–99)
Potassium: 5.1 mmol/L (ref 3.5–5.2)
Sodium: 144 mmol/L (ref 134–144)
Total Bilirubin: 0.3 mg/dL (ref 0.0–1.2)
Total Protein: 6.6 g/dL (ref 6.0–8.5)

## 2012-11-11 LAB — NMR, LIPOPROFILE
Cholesterol: 93 mg/dL (ref <200)
HDL Cholesterol by NMR: 35 mg/dL — ABNORMAL LOW (ref >=40)
HDL Particle Number: 29.4 umol/L — ABNORMAL LOW (ref >=30.5)
LDL Particle Number: 593 nmol/L (ref <1000)
LDL Size: 21.6 nm (ref >20.5)
LDLC SERPL CALC-MCNC: 35 mg/dL (ref <100)
LP-IR Score: 50 — ABNORMAL HIGH (ref <=45)
Small LDL Particle Number: 237 nmol/L (ref <=527)
Triglycerides by NMR: 116 mg/dL (ref <150)

## 2012-11-11 LAB — VITAMIN D 25 HYDROXY (VIT D DEFICIENCY, FRACTURES): Vit D, 25-Hydroxy: 40.9 ng/mL (ref 30.0–100.0)

## 2013-01-10 ENCOUNTER — Ambulatory Visit (INDEPENDENT_AMBULATORY_CARE_PROVIDER_SITE_OTHER): Payer: PRIVATE HEALTH INSURANCE | Admitting: Family Medicine

## 2013-01-10 ENCOUNTER — Encounter: Payer: Self-pay | Admitting: Family Medicine

## 2013-01-10 VITALS — BP 133/58 | HR 92 | Temp 97.3°F | Ht 71.0 in | Wt 325.4 lb

## 2013-01-10 DIAGNOSIS — E119 Type 2 diabetes mellitus without complications: Secondary | ICD-10-CM

## 2013-01-10 DIAGNOSIS — Z0000 Encounter for general adult medical examination without abnormal findings: Secondary | ICD-10-CM

## 2013-01-10 DIAGNOSIS — N529 Male erectile dysfunction, unspecified: Secondary | ICD-10-CM

## 2013-01-10 DIAGNOSIS — Z119 Encounter for screening for infectious and parasitic diseases, unspecified: Secondary | ICD-10-CM

## 2013-01-10 DIAGNOSIS — E11319 Type 2 diabetes mellitus with unspecified diabetic retinopathy without macular edema: Secondary | ICD-10-CM

## 2013-01-10 DIAGNOSIS — I10 Essential (primary) hypertension: Secondary | ICD-10-CM

## 2013-01-10 DIAGNOSIS — E785 Hyperlipidemia, unspecified: Secondary | ICD-10-CM

## 2013-01-10 DIAGNOSIS — G8929 Other chronic pain: Secondary | ICD-10-CM

## 2013-01-10 DIAGNOSIS — N189 Chronic kidney disease, unspecified: Secondary | ICD-10-CM

## 2013-01-10 LAB — POCT URINALYSIS DIPSTICK
Bilirubin, UA: NEGATIVE
Blood, UA: NEGATIVE
Glucose, UA: NEGATIVE
Ketones, UA: NEGATIVE
Leukocytes, UA: NEGATIVE
Nitrite, UA: NEGATIVE
Protein, UA: NEGATIVE
Spec Grav, UA: <=1.005
Urobilinogen, UA: NEGATIVE
pH, UA: 6.5

## 2013-01-10 LAB — POCT UA - MICROSCOPIC ONLY
Bacteria, U Microscopic: NEGATIVE
Casts, Ur, LPF, POC: NEGATIVE
Crystals, Ur, HPF, POC: NEGATIVE
Mucus, UA: NEGATIVE
RBC, urine, microscopic: NEGATIVE
WBC, Ur, HPF, POC: NEGATIVE
Yeast, UA: NEGATIVE

## 2013-01-10 NOTE — Patient Instructions (Addendum)
HEALTH MAINTENANCE Immunizations: Tetanus-Diphtheria Booster [phone] Pertusis Booster due:203 Flu Shot Due: every Fall Pneumonia Vaccine: usually at 59 years of age unless there are certain risk situations. Herpes Zoster/Shingles Vaccine due: usually at 59 years of age HPV [id number] age 77 to 43 years in males and females.  Healthy Life Habits: Exercise Goal: 5-6 days/week; start gradually(ie 30 minutes/3days per week) Nutrition: Balanced healthy meals including Vegetables and Fruits. Consider  Reading the following books: 1) Eat to Live by Dr Diona Browner; 2) Prevent and Reverse Heart Disease by Dr Karl Luke.  Vitamins:okay to take a MUltivitamin Aspirin:81 mg Stop Tobacco Use:n/a Seat Belt Use:+++ recommended Sunscreen Use:+++ recommended  Recommended Screening Tests: Colon Cancer Screening:Up to date Blood work: Up to date Cholesterol Screening: up to date         HIV:   today              Hepatitis C(people born 1945-1965):today   Monthly Self Testicular Exam:++++  Eye Exam: every 1 to 2 years recommended Dental Health: at least every 6 months  Others:    Living Will/Healthcare Power of Attorney: should have this in order with your personal estate planning

## 2013-01-10 NOTE — Progress Notes (Signed)
Patient ID: Timothy Holloway, male   DOB: [birth year], 57 y.o.   MRN: [id number] SUBJECTIVE: CC: Chief Complaint  Patient presents with  . Annual Exam    here for cpx  not taking humalog per dr Buddy Duty    HPI:   Here for his annual CPE for work Dieting improved vegetables and fruit intake and losing weight. Gradually  Medical problems reviewed and he is doing much better  Past Medical History  Diagnosis Date  . Diabetes mellitus without complication   . Impotence   . Hypertension   . Insomnia   . Hyperlipidemia   . Chronic kidney disease (CKD), stage III (moderate)     dr Buddy Duty  . Cataract   . Obesity    Past Surgical History  Procedure Laterality Date  . Spine surgery      fusion of L4-L5  . Knee surgery Left 01-31-2002   History   Social History  . Marital Status: Married    Spouse Name: N/A    Number of Children: N/A  . Years of Education: N/A   Occupational History  . Not on file.   Social History Main Topics  . Smoking status: Never Smoker   . Smokeless tobacco: Not on file  . Alcohol Use: No  . Drug Use: No  . Sexual Activity: Yes     Comment: not much   Other Topics Concern  . Not on file   Social History Narrative  . No narrative on file   Family History  Problem Relation Age of Onset  . Diabetes Mother   . Cancer Father     lymphoma   Current Outpatient Prescriptions on File Prior to Visit  Medication Sig Dispense Refill  . amLODipine (NORVASC) 10 MG tablet Take 1 tablet (10 mg total) by mouth daily.  30 tablet  11  . aspirin 81 MG tablet Take 81 mg by mouth daily.      Marland Kitchen atorvastatin (LIPITOR) 10 MG tablet Take 1 tablet (10 mg total) by mouth daily.  30 tablet  11  . carvedilol (COREG CR) 80 MG 24 hr capsule Take 1 capsule (80 mg total) by mouth daily.  30 capsule  11  . Cholecalciferol (VITAMIN D) 2000 UNITS CAPS Take 1 capsule by mouth daily.      . cyclobenzaprine (FLEXERIL) 10 MG tablet TAKE ONE TABLET BY MOUTH AT BEDTIME  30 tablet  1   . fenofibrate (TRICOR) 145 MG tablet Take 1/2 tablet daily  30 tablet  11  . ferrous sulfate 325 (65 FE) MG tablet Take 325 mg by mouth daily with breakfast.      . furosemide (LASIX) 80 MG tablet Take 120 mg by mouth daily.      . insulin glargine (LANTUS) 100 UNIT/ML injection Inject 90 Units into the skin at bedtime. Dr Buddy Duty      . Liraglutide (VICTOZA) 18 MG/3ML SOPN Inject 1.8 mcg into the skin daily.  3 mL  11  . Multiple Vitamin (MULTIVITAMIN) tablet Take 1 tablet by mouth daily.      . valsartan (DIOVAN) 320 MG tablet Take 1 tablet (320 mg total) by mouth daily.  30 tablet  11  . zolpidem (AMBIEN) 10 MG tablet Take 1 tablet (10 mg total) by mouth at bedtime as needed for sleep.  30 tablet  3   No current facility-administered medications on file prior to visit.   Allergies  Allergen Reactions  . Latex   . Lodine [Etodolac]  Nausea/vomiting   Immunization History  Administered Date(s) Administered  . Influenza,inj,Quad PF,36+ Mos 11/09/2012  . Pneumococcal Polysaccharide-23 03/31/2012  . Tdap 01/07/2012   Prior to Admission medications   Medication Sig Start Date End Date Taking? Authorizing Provider  amLODipine (NORVASC) 10 MG tablet Take 1 tablet (10 mg total) by mouth daily. 08/06/12  Yes Vernie Shanks, MD  aspirin 81 MG tablet Take 81 mg by mouth daily.   Yes Historical Provider, MD  atorvastatin (LIPITOR) 10 MG tablet Take 1 tablet (10 mg total) by mouth daily. 08/06/12  Yes Vernie Shanks, MD  carvedilol (COREG CR) 80 MG 24 hr capsule Take 1 capsule (80 mg total) by mouth daily. 08/06/12  Yes Vernie Shanks, MD  Cholecalciferol (VITAMIN D) 2000 UNITS CAPS Take 1 capsule by mouth daily.   Yes Historical Provider, MD  cyclobenzaprine (FLEXERIL) 10 MG tablet TAKE ONE TABLET BY MOUTH AT BEDTIME 08/06/12  Yes Vernie Shanks, MD  fenofibrate (TRICOR) 145 MG tablet Take 1/2 tablet daily 09/03/12  Yes Tammy Eckard, PHARMD  ferrous sulfate 325 (65 FE) MG tablet Take 325 mg by mouth  daily with breakfast.   Yes Historical Provider, MD  furosemide (LASIX) 80 MG tablet Take 120 mg by mouth daily. 08/06/12  Yes Vernie Shanks, MD  insulin glargine (LANTUS) 100 UNIT/ML injection Inject 90 Units into the skin at bedtime. Dr Buddy Duty   Yes Historical Provider, MD  Liraglutide (VICTOZA) 18 MG/3ML SOPN Inject 1.8 mcg into the skin daily. 08/06/12  Yes Vernie Shanks, MD  Multiple Vitamin (MULTIVITAMIN) tablet Take 1 tablet by mouth daily.   Yes Historical Provider, MD  valsartan (DIOVAN) 320 MG tablet Take 1 tablet (320 mg total) by mouth daily. 08/06/12  Yes Vernie Shanks, MD  zolpidem (AMBIEN) 10 MG tablet Take 1 tablet (10 mg total) by mouth at bedtime as needed for sleep. 08/06/12  Yes Vernie Shanks, MD     ROS: As above in the HPI. All other systems are stable or negative.  OBJECTIVE: APPEARANCE:  Patient in no acute distress.The patient appeared well nourished and normally developed. Acyanotic. Waist: VITAL SIGNS:BP 133/58  Pulse 92  Temp(Src) 97.3 F (36.3 C) (Oral)  Ht 5\' 11"  (1.803 m)  Wt 325 lb 6.4 oz (147.6 kg)  BMI 45.40 kg/m2 Morbidly obese WM   SKIN: warm and  Dry without overt rashes, tattoos and scars  HEAD and Neck: without JVD, Head and scalp: normal Eyes:No scleral icterus. Fundi normal, eye movements normal. Ears: Auricle normal, canal normal, Tympanic membranes normal, insufflation normal. Nose: normal Throat: normal Neck & thyroid: normal  CHEST & LUNGS: Chest wall: normal Lungs: Clear  CVS: Reveals the PMI to be normally located. Regular rhythm, First and Second Heart sounds are normal,  absence of murmurs, rubs or gallops. Peripheral vasculature: Radial pulses: normal Dorsal pedis pulses: normal Posterior pulses: normal  ABDOMEN:  Appearance: obese Benign, no organomegaly, no masses, no Abdominal Aortic enlargement. No Guarding , no rebound. No Bruits. Bowel sounds: normal  RECTAL: N/A GU: N/A  EXTREMETIES:  nonedematous.  MUSCULOSKELETAL:  Spine: normal Joints: missing / amputated right second toe.   NEUROLOGIC: oriented to time,place and person; nonfocal. Strength is normal Sensory is normal Reflexes are normal Cranial Nerves are normal.   Results for orders placed in visit on 01/10/13  POCT URINALYSIS DIPSTICK      Result Value Range   Color, UA yellow     Clarity, UA clear  Glucose, UA neg     Bilirubin, UA neg     Ketones, UA neg     Spec Grav, UA <=1.005     Blood, UA neg     pH, UA 6.5     Protein, UA neg     Urobilinogen, UA negative     Nitrite, UA neg     Leukocytes, UA Negative    POCT UA - MICROSCOPIC ONLY      Result Value Range   WBC, Ur, HPF, POC neg     RBC, urine, microscopic neg     Bacteria, U Microscopic neg     Mucus, UA neg     Epithelial cells, urine per micros occ     Crystals, Ur, HPF, POC neg     Casts, Ur, LPF, POC neg     Yeast, UA neg      ASSESSMENT: Annual physical exam - Plan: POCT urinalysis dipstick, POCT UA - Microscopic Only, CANCELED: EKG 12-Lead  Erectile dysfunction  Chronic back pain  Morbid obesity  HTN (hypertension)  HLD (hyperlipidemia)  Diabetic retinopathy  Diabetes mellitus  CKD (chronic kidney disease)  Screening examination for infectious disease - Plan: Hepatitis C antibody, HIV antibody Urine is fine.  PLAN:      HEALTH MAINTENANCE Immunizations: Tetanus-Diphtheria Booster [phone] Pertusis Booster due:203 Flu Shot Due: every Fall Pneumonia Vaccine: usually at 60 years of age unless there are certain risk situations. Herpes Zoster/Shingles Vaccine due: usually at 59 years of age HPV [id number] age 36 to 41 years in males and females.  Healthy Life Habits: Exercise Goal: 5-6 days/week; start gradually(ie 30 minutes/3days per week) Nutrition: Balanced healthy meals including Vegetables and Fruits. Consider  Reading the following books: 1) Eat to Live by Dr Diona Browner; 2) Prevent and Reverse  Heart Disease by Dr Karl Luke.  Vitamins:okay to take a MUltivitamin Aspirin:81 mg Stop Tobacco Use:n/a Seat Belt Use:+++ recommended Sunscreen Use:+++ recommended  Recommended Screening Tests: Colon Cancer Screening:Up to date Blood work: Up to date Cholesterol Screening: up to date         HIV:   today              Hepatitis C(people born 1945-1965):today   Monthly Self Testicular Exam:++++  Eye Exam: every 1 to 2 years recommended Dental Health: at least every 6 months  Others:    Living Will/Healthcare Power of Attorney: should have this in order with your personal estate planning    Orders Placed This Encounter  Procedures  . Hepatitis C antibody  . HIV antibody  . POCT urinalysis dipstick  . POCT UA - Microscopic Only   No orders of the defined types were placed in this encounter.   Medications Discontinued During This Encounter  Medication Reason  . insulin lispro (HUMALOG) 100 UNIT/ML injection Completed Course  forms filled out and signed for work.  Return in about 2 months (around 03/13/2013) for Recheck medical problems.  Diara Chaudhari P. Jacelyn Grip, M.D.

## 2013-01-11 ENCOUNTER — Other Ambulatory Visit: Payer: Self-pay | Admitting: *Deleted

## 2013-01-11 DIAGNOSIS — G4700 Insomnia, unspecified: Secondary | ICD-10-CM

## 2013-01-11 LAB — HIV ANTIBODY (ROUTINE TESTING W REFLEX)
HIV 1/O/2 Abs-Index Value: <1 (ref <1.00)
HIV-1/HIV-2 Ab: NONREACTIVE

## 2013-01-11 LAB — HEPATITIS C ANTIBODY: Hep C Virus Ab: <0.1 s/co ratio (ref 0.0–0.9)

## 2013-01-11 MED ORDER — ZOLPIDEM TARTRATE 10 MG PO TABS: 10.0000 mg | ORAL_TABLET | Freq: Every evening | ORAL | Status: DC | PRN

## 2013-01-11 NOTE — Progress Notes (Signed)
Quick Note:  Call patient. Labs normal. No change in plan. ______ 

## 2013-01-11 NOTE — Telephone Encounter (Signed)
Last seen 01/10/13, last filled 11/08/12. Route to pool A, call into [facility]

## 2013-01-11 NOTE — Telephone Encounter (Signed)
Rx ready for nurse to Phone in. 

## 2013-01-14 NOTE — Telephone Encounter (Signed)
rx called in [facility]

## 2013-01-29 ENCOUNTER — Other Ambulatory Visit: Payer: Self-pay | Admitting: Family Medicine

## 2013-05-02 ENCOUNTER — Other Ambulatory Visit: Payer: Self-pay | Admitting: Family Medicine

## 2013-05-06 NOTE — Telephone Encounter (Signed)
Last seen 01/10/13, last filled 04/01/13. If approved have nurse call into [facility]

## 2013-05-06 NOTE — Telephone Encounter (Signed)
Patient needs to be seen. Patient has exceeded limit since last visit. Refill denied. Bring all medications at next office visit. 

## 2013-09-06 ENCOUNTER — Other Ambulatory Visit: Payer: Self-pay

## 2013-09-06 DIAGNOSIS — E785 Hyperlipidemia, unspecified: Secondary | ICD-10-CM

## 2013-09-06 MED ORDER — AMLODIPINE BESYLATE 10 MG PO TABS: 10.0000 mg | ORAL_TABLET | Freq: Every day | ORAL | Status: DC

## 2013-09-06 MED ORDER — FENOFIBRATE 145 MG PO TABS: ORAL_TABLET | ORAL | Status: DC

## 2013-09-06 NOTE — Telephone Encounter (Signed)
Last seen 01/10/13  [facility]  Last lipid 10/14

## 2013-09-06 NOTE — Telephone Encounter (Signed)
Both of these prescriptions may be refilled for one time. The patient needs to make an appointment to be seen by a provider.

## 2014-07-28 ENCOUNTER — Other Ambulatory Visit: Payer: Self-pay

## 2016-11-14 ENCOUNTER — Ambulatory Visit: Payer: PRIVATE HEALTH INSURANCE | Admitting: *Deleted

## 2016-11-14 DIAGNOSIS — Z23 Encounter for immunization: Secondary | ICD-10-CM | POA: Diagnosis not present

## 2018-09-10 DIAGNOSIS — D631 Anemia in chronic kidney disease: Secondary | ICD-10-CM | POA: Diagnosis not present

## 2018-09-10 DIAGNOSIS — R609 Edema, unspecified: Secondary | ICD-10-CM | POA: Diagnosis not present

## 2018-09-10 DIAGNOSIS — N183 Chronic kidney disease, stage 3 (moderate): Secondary | ICD-10-CM | POA: Diagnosis not present

## 2018-09-10 DIAGNOSIS — I129 Hypertensive chronic kidney disease with stage 1 through stage 4 chronic kidney disease, or unspecified chronic kidney disease: Secondary | ICD-10-CM | POA: Diagnosis not present

## 2018-09-10 DIAGNOSIS — E1122 Type 2 diabetes mellitus with diabetic chronic kidney disease: Secondary | ICD-10-CM | POA: Diagnosis not present

## 2018-09-10 DIAGNOSIS — E1129 Type 2 diabetes mellitus with other diabetic kidney complication: Secondary | ICD-10-CM | POA: Diagnosis not present

## 2018-09-10 DIAGNOSIS — Z1159 Encounter for screening for other viral diseases: Secondary | ICD-10-CM | POA: Diagnosis not present

## 2018-11-13 DIAGNOSIS — Z23 Encounter for immunization: Secondary | ICD-10-CM | POA: Diagnosis not present

## 2019-04-09 DIAGNOSIS — R69 Illness, unspecified: Secondary | ICD-10-CM | POA: Diagnosis not present

## 2019-04-09 DIAGNOSIS — I129 Hypertensive chronic kidney disease with stage 1 through stage 4 chronic kidney disease, or unspecified chronic kidney disease: Secondary | ICD-10-CM | POA: Diagnosis not present

## 2019-04-09 DIAGNOSIS — Z23 Encounter for immunization: Secondary | ICD-10-CM | POA: Diagnosis not present

## 2019-04-09 DIAGNOSIS — E1169 Type 2 diabetes mellitus with other specified complication: Secondary | ICD-10-CM | POA: Diagnosis not present

## 2019-04-09 DIAGNOSIS — E1159 Type 2 diabetes mellitus with other circulatory complications: Secondary | ICD-10-CM | POA: Diagnosis not present

## 2019-04-09 DIAGNOSIS — Z125 Encounter for screening for malignant neoplasm of prostate: Secondary | ICD-10-CM | POA: Diagnosis not present

## 2019-04-09 DIAGNOSIS — N183 Chronic kidney disease, stage 3 unspecified: Secondary | ICD-10-CM | POA: Diagnosis not present

## 2019-04-09 DIAGNOSIS — Z0000 Encounter for general adult medical examination without abnormal findings: Secondary | ICD-10-CM | POA: Diagnosis not present

## 2019-04-09 DIAGNOSIS — E785 Hyperlipidemia, unspecified: Secondary | ICD-10-CM | POA: Diagnosis not present

## 2019-04-09 DIAGNOSIS — E1122 Type 2 diabetes mellitus with diabetic chronic kidney disease: Secondary | ICD-10-CM | POA: Diagnosis not present

## 2020-04-07 LAB — COLOGUARD: COLOGUARD: NEGATIVE

## 2020-09-10 ENCOUNTER — Inpatient Hospital Stay (HOSPITAL_COMMUNITY)
Admission: EM | Admit: 2020-09-10 | Discharge: 2020-09-13 | DRG: 683 | Disposition: A | Discharge status: Home / Self Care | Payer: BC Managed Care – PPO | Attending: Internal Medicine | Admitting: Internal Medicine

## 2020-09-10 ENCOUNTER — Other Ambulatory Visit: Payer: Self-pay

## 2020-09-10 ENCOUNTER — Encounter (HOSPITAL_COMMUNITY): Payer: Self-pay

## 2020-09-10 ENCOUNTER — Emergency Department (HOSPITAL_COMMUNITY): Payer: BC Managed Care – PPO

## 2020-09-10 DIAGNOSIS — E1169 Type 2 diabetes mellitus with other specified complication: Secondary | ICD-10-CM | POA: Diagnosis not present

## 2020-09-10 DIAGNOSIS — Z833 Family history of diabetes mellitus: Secondary | ICD-10-CM

## 2020-09-10 DIAGNOSIS — N179 Acute kidney failure, unspecified: Principal | ICD-10-CM | POA: Diagnosis present

## 2020-09-10 DIAGNOSIS — N1831 Chronic kidney disease, stage 3a: Secondary | ICD-10-CM | POA: Diagnosis present

## 2020-09-10 DIAGNOSIS — F419 Anxiety disorder, unspecified: Secondary | ICD-10-CM | POA: Diagnosis present

## 2020-09-10 DIAGNOSIS — I10 Essential (primary) hypertension: Secondary | ICD-10-CM | POA: Diagnosis not present

## 2020-09-10 DIAGNOSIS — Z807 Family history of other malignant neoplasms of lymphoid, hematopoietic and related tissues: Secondary | ICD-10-CM

## 2020-09-10 DIAGNOSIS — Z6841 Body Mass Index (BMI) 40.0 and over, adult: Secondary | ICD-10-CM

## 2020-09-10 DIAGNOSIS — E785 Hyperlipidemia, unspecified: Secondary | ICD-10-CM | POA: Diagnosis present

## 2020-09-10 DIAGNOSIS — Z7982 Long term (current) use of aspirin: Secondary | ICD-10-CM

## 2020-09-10 DIAGNOSIS — R001 Bradycardia, unspecified: Secondary | ICD-10-CM | POA: Diagnosis not present

## 2020-09-10 DIAGNOSIS — Z981 Arthrodesis status: Secondary | ICD-10-CM

## 2020-09-10 DIAGNOSIS — G4733 Obstructive sleep apnea (adult) (pediatric): Secondary | ICD-10-CM | POA: Diagnosis present

## 2020-09-10 DIAGNOSIS — G4700 Insomnia, unspecified: Secondary | ICD-10-CM | POA: Diagnosis present

## 2020-09-10 DIAGNOSIS — E119 Type 2 diabetes mellitus without complications: Secondary | ICD-10-CM

## 2020-09-10 DIAGNOSIS — K219 Gastro-esophageal reflux disease without esophagitis: Secondary | ICD-10-CM | POA: Diagnosis present

## 2020-09-10 DIAGNOSIS — Z79899 Other long term (current) drug therapy: Secondary | ICD-10-CM

## 2020-09-10 DIAGNOSIS — I129 Hypertensive chronic kidney disease with stage 1 through stage 4 chronic kidney disease, or unspecified chronic kidney disease: Secondary | ICD-10-CM | POA: Diagnosis present

## 2020-09-10 DIAGNOSIS — E876 Hypokalemia: Secondary | ICD-10-CM | POA: Diagnosis present

## 2020-09-10 DIAGNOSIS — D631 Anemia in chronic kidney disease: Secondary | ICD-10-CM | POA: Diagnosis present

## 2020-09-10 DIAGNOSIS — R600 Localized edema: Secondary | ICD-10-CM | POA: Diagnosis present

## 2020-09-10 DIAGNOSIS — E871 Hypo-osmolality and hyponatremia: Secondary | ICD-10-CM | POA: Diagnosis present

## 2020-09-10 DIAGNOSIS — Z794 Long term (current) use of insulin: Secondary | ICD-10-CM

## 2020-09-10 DIAGNOSIS — Z888 Allergy status to other drugs, medicaments and biological substances status: Secondary | ICD-10-CM

## 2020-09-10 DIAGNOSIS — D509 Iron deficiency anemia, unspecified: Secondary | ICD-10-CM | POA: Diagnosis present

## 2020-09-10 DIAGNOSIS — E669 Obesity, unspecified: Secondary | ICD-10-CM | POA: Diagnosis present

## 2020-09-10 DIAGNOSIS — Z20822 Contact with and (suspected) exposure to covid-19: Secondary | ICD-10-CM | POA: Diagnosis present

## 2020-09-10 DIAGNOSIS — N1832 Chronic kidney disease, stage 3b: Secondary | ICD-10-CM | POA: Diagnosis present

## 2020-09-10 DIAGNOSIS — Z91040 Latex allergy status: Secondary | ICD-10-CM

## 2020-09-10 DIAGNOSIS — E1165 Type 2 diabetes mellitus with hyperglycemia: Secondary | ICD-10-CM | POA: Diagnosis present

## 2020-09-10 DIAGNOSIS — E1122 Type 2 diabetes mellitus with diabetic chronic kidney disease: Secondary | ICD-10-CM | POA: Diagnosis present

## 2020-09-10 LAB — URINALYSIS, ROUTINE W REFLEX MICROSCOPIC
Bacteria, UA: NONE SEEN
Bilirubin Urine: NEGATIVE
Glucose, UA: >=500 mg/dL — AB
Hgb urine dipstick: NEGATIVE
Ketones, ur: NEGATIVE mg/dL
Leukocytes,Ua: NEGATIVE
Nitrite: NEGATIVE
Protein, ur: 100 mg/dL — AB
Specific Gravity, Urine: 1.007 (ref 1.005–1.030)
pH: 6 (ref 5.0–8.0)

## 2020-09-10 LAB — CBC WITH DIFFERENTIAL/PLATELET
Abs Immature Granulocytes: 0.21 10*3/uL — ABNORMAL HIGH (ref 0.00–0.07)
Basophils Absolute: 0 10*3/uL (ref 0.0–0.1)
Basophils Relative: 1 %
Eosinophils Absolute: 0.3 10*3/uL (ref 0.0–0.5)
Eosinophils Relative: 4 %
HCT: 28.5 % — ABNORMAL LOW (ref 39.0–52.0)
Hemoglobin: 9.5 g/dL — ABNORMAL LOW (ref 13.0–17.0)
Immature Granulocytes: 3 %
Lymphocytes Relative: 15 %
Lymphs Abs: 1.3 10*3/uL (ref 0.7–4.0)
MCH: 30.5 pg (ref 26.0–34.0)
MCHC: 33.3 g/dL (ref 30.0–36.0)
MCV: 91.6 fL (ref 80.0–100.0)
Monocytes Absolute: 0.8 10*3/uL (ref 0.1–1.0)
Monocytes Relative: 10 %
Neutro Abs: 5.9 10*3/uL (ref 1.7–7.7)
Neutrophils Relative %: 67 %
Platelets: 263 10*3/uL (ref 150–400)
RBC: 3.11 MIL/uL — ABNORMAL LOW (ref 4.22–5.81)
RDW: 12.9 % (ref 11.5–15.5)
WBC: 8.6 10*3/uL (ref 4.0–10.5)
nRBC: 0 % (ref 0.0–0.2)

## 2020-09-10 LAB — COMPREHENSIVE METABOLIC PANEL
ALT: 14 U/L (ref 0–44)
AST: 15 U/L (ref 15–41)
Albumin: 3.4 g/dL — ABNORMAL LOW (ref 3.5–5.0)
Alkaline Phosphatase: 76 U/L (ref 38–126)
Anion gap: 13 (ref 5–15)
BUN: 102 mg/dL — ABNORMAL HIGH (ref 8–23)
CO2: 23 mmol/L (ref 22–32)
Calcium: 8.9 mg/dL (ref 8.9–10.3)
Chloride: 96 mmol/L — ABNORMAL LOW (ref 98–111)
Creatinine, Ser: 4.47 mg/dL — ABNORMAL HIGH (ref 0.61–1.24)
GFR, Estimated: 14 mL/min — ABNORMAL LOW (ref >60)
Glucose, Bld: 302 mg/dL — ABNORMAL HIGH (ref 70–99)
Potassium: 4.3 mmol/L (ref 3.5–5.1)
Sodium: 132 mmol/L — ABNORMAL LOW (ref 135–145)
Total Bilirubin: 0.4 mg/dL (ref 0.3–1.2)
Total Protein: 7.2 g/dL (ref 6.5–8.1)

## 2020-09-10 LAB — CBG MONITORING, ED: Glucose-Capillary: 287 mg/dL — ABNORMAL HIGH (ref 70–99)

## 2020-09-10 MED ORDER — ESCITALOPRAM OXALATE 10 MG PO TABS
10.0000 mg | ORAL_TABLET | Freq: Every day | ORAL | Status: DC
  Administered 2020-09-11 – 2020-09-13 (×3): 10 mg via ORAL
  Filled 2020-09-10 (×3): qty 1

## 2020-09-10 MED ORDER — ASPIRIN 81 MG PO TABS: 81.0000 mg | ORAL_TABLET | Freq: Every day | ORAL | Status: DC

## 2020-09-10 MED ORDER — INSULIN ASPART 100 UNIT/ML IJ SOLN
0.0000 [IU] | Freq: Three times a day (TID) | INTRAMUSCULAR | Status: DC
  Administered 2020-09-11 (×2): 7 [IU] via SUBCUTANEOUS
  Administered 2020-09-12 (×2): 2 [IU] via SUBCUTANEOUS
  Administered 2020-09-13: 1 [IU] via SUBCUTANEOUS

## 2020-09-10 MED ORDER — ENOXAPARIN SODIUM 30 MG/0.3ML IJ SOSY
30.0000 mg | PREFILLED_SYRINGE | INTRAMUSCULAR | Status: DC
  Administered 2020-09-11 – 2020-09-12 (×2): 30 mg via SUBCUTANEOUS
  Filled 2020-09-10 (×2): qty 0.3

## 2020-09-10 MED ORDER — INSULIN DETEMIR 100 UNIT/ML ~~LOC~~ SOLN
50.0000 [IU] | Freq: Every day | SUBCUTANEOUS | Status: DC
  Administered 2020-09-10 – 2020-09-12 (×3): 50 [IU] via SUBCUTANEOUS
  Filled 2020-09-10 (×4): qty 0.5

## 2020-09-10 MED ORDER — HYDROCHLOROTHIAZIDE 25 MG PO TABS
25.0000 mg | ORAL_TABLET | Freq: Every day | ORAL | Status: DC
  Administered 2020-09-10: 25 mg via ORAL
  Filled 2020-09-10: qty 1

## 2020-09-10 MED ORDER — ACETAMINOPHEN 325 MG PO TABS: 650.0000 mg | ORAL_TABLET | Freq: Four times a day (QID) | ORAL | Status: DC | PRN

## 2020-09-10 MED ORDER — HYDRALAZINE HCL 25 MG PO TABS
25.0000 mg | ORAL_TABLET | Freq: Three times a day (TID) | ORAL | Status: DC
  Administered 2020-09-10: 25 mg via ORAL
  Filled 2020-09-10: qty 1

## 2020-09-10 MED ORDER — CARVEDILOL 12.5 MG PO TABS: 25.0000 mg | ORAL_TABLET | Freq: Two times a day (BID) | ORAL | Status: DC

## 2020-09-10 MED ORDER — SODIUM CHLORIDE 0.9% FLUSH
3.0000 mL | Freq: Two times a day (BID) | INTRAVENOUS | Status: DC
  Administered 2020-09-10 – 2020-09-13 (×5): 3 mL via INTRAVENOUS

## 2020-09-10 MED ORDER — CLONIDINE HCL 0.1 MG PO TABS
0.1000 mg | ORAL_TABLET | Freq: Two times a day (BID) | ORAL | Status: DC
  Administered 2020-09-10 – 2020-09-13 (×6): 0.1 mg via ORAL
  Filled 2020-09-10 (×5): qty 1

## 2020-09-10 MED ORDER — ATORVASTATIN CALCIUM 10 MG PO TABS
10.0000 mg | ORAL_TABLET | Freq: Every day | ORAL | Status: DC
  Administered 2020-09-11 – 2020-09-13 (×3): 10 mg via ORAL
  Filled 2020-09-10 (×3): qty 1

## 2020-09-10 MED ORDER — ACETAMINOPHEN 650 MG RE SUPP: 650.0000 mg | Freq: Four times a day (QID) | RECTAL | Status: DC | PRN

## 2020-09-10 MED ORDER — LACTATED RINGERS IV BOLUS
1000.0000 mL | Freq: Once | INTRAVENOUS | Status: AC
  Administered 2020-09-10: 1000 mL via INTRAVENOUS

## 2020-09-10 NOTE — ED Provider Notes (Signed)
[facility] EMERGENCY DEPARTMENT Provider Note   CSN: [id number] Arrival date & time: 09/10/20  1125    History CC - AKI  Timothy Holloway is a 65 y.o. male.  HPI  65 year old male with a past medical history of CKD, diabetes, hypertension, hyperlipidemia presenting to the emergency department sent by his primary care doctor due to concern for worsening kidney function.  Patient reports that he went to his primary care doctor yesterday for a routine physical exam and then was called today and told he needed to come to the ED immediately because his kidney function is worsened.  He denies any recent nausea, vomiting, diarrhea.  He states that he takes furosemide 80 mg in the morning and this has not changed recently.  He is still having adequate urine output.  He denies any chest pain or shortness of breath.  He denies any hematuria, frothy character to his urine, or any other urinary abnormalities.  He denies any abdominal pain.  Past Medical History:  Diagnosis Date   Cataract    Chronic kidney disease (CKD), stage III (moderate) ([place])    dr Buddy Duty   Diabetes mellitus without complication ([place])    Hyperlipidemia    Hypertension    Impotence    Insomnia    Obesity     Patient Active Problem List   Diagnosis Date Noted   Acute renal failure superimposed on stage 3a chronic kidney disease ([place]) 09/10/2020   Morbid obesity ([place]) 08/06/2012   HLD (hyperlipidemia) 08/06/2012   HTN (hypertension) 08/06/2012   Diabetic retinopathy ([place]) 08/06/2012   Erectile dysfunction 08/06/2012   Diabetes mellitus ([place]) 08/06/2012   Insomnia 08/06/2012   Muscle spasm of back 08/06/2012   Chronic back pain 08/06/2012   CKD (chronic kidney disease) 08/06/2012      Family History  Problem Relation Age of Onset   Diabetes Mother    Cancer Father        lymphoma    Social History   Tobacco Use   Smoking status: Never  Substance Use Topics   Alcohol use: No   Drug  use: No    Home Medications Prior to Admission medications   Medication Sig Start Date End Date Taking? Authorizing Provider  amLODipine (NORVASC) 10 MG tablet Take 1 tablet (10 mg total) by mouth daily. 09/06/13   Chipper Herb, MD  aspirin 81 MG tablet Take 81 mg by mouth daily.    [provider]  atorvastatin (LIPITOR) 10 MG tablet Take 1 tablet (10 mg total) by mouth daily. 08/06/12   Vernie Shanks, MD  carvedilol (COREG CR) 80 MG 24 hr capsule Take 1 capsule (80 mg total) by mouth daily. 08/06/12   Vernie Shanks, MD  Cholecalciferol (VITAMIN D) 2000 UNITS CAPS Take 1 capsule by mouth daily.    [provider]  cyclobenzaprine (FLEXERIL) 10 MG tablet TAKE ONE TABLET BY MOUTH AT BEDTIME 01/29/13   Vernie Shanks, MD  fenofibrate (TRICOR) 145 MG tablet Take 1/2 tablet daily 09/06/13   Chipper Herb, MD  ferrous sulfate 325 (65 FE) MG tablet Take 325 mg by mouth daily with breakfast.    [provider]  furosemide (LASIX) 80 MG tablet Take 120 mg by mouth daily. 08/06/12   Vernie Shanks, MD  insulin glargine (LANTUS) 100 UNIT/ML injection Inject 90 Units into the skin at bedtime. Dr Buddy Duty    [provider]  Liraglutide (VICTOZA) 18 MG/3ML SOPN Inject  1.8 mcg into the skin daily. 08/06/12   Vernie Shanks, MD  Multiple Vitamin (MULTIVITAMIN) tablet Take 1 tablet by mouth daily.    [provider]  valsartan (DIOVAN) 320 MG tablet Take 1 tablet (320 mg total) by mouth daily. 08/06/12   Vernie Shanks, MD  zolpidem (AMBIEN) 10 MG tablet Take 1 tablet (10 mg total) by mouth at bedtime as needed for sleep. 01/11/13   Vernie Shanks, MD    Allergies    Latex and Lodine [etodolac]  Review of Systems   Review of Systems  Constitutional:  Negative for chills and fever.  HENT:  Negative for ear pain and sore throat.   Eyes:  Negative for pain and visual disturbance.  Respiratory:  Negative for cough and shortness of breath.   Cardiovascular:   Negative for chest pain and palpitations.  Gastrointestinal:  Negative for abdominal pain and vomiting.  Endocrine: Positive for polyuria.  Genitourinary:  Negative for dysuria and hematuria.  Musculoskeletal:  Negative for arthralgias and back pain.  Skin:  Negative for color change and rash.  Neurological:  Negative for seizures and syncope.  All other systems reviewed and are negative.  Physical Exam Updated Vital Signs BP (!) 177/75   Pulse (!) 58   Temp 98.2 F (36.8 C)   Resp 18   Ht 6' (1.829 m)   Wt 134.3 kg   SpO2 100%   BMI 40.14 kg/m   Physical Exam Vitals and nursing note reviewed.  Constitutional:      General: He is not in acute distress.    Appearance: Normal appearance. He is well-developed. He is obese. He is not ill-appearing or toxic-appearing.  HENT:     Head: Normocephalic and atraumatic.  Eyes:     Conjunctiva/sclera: Conjunctivae normal.  Cardiovascular:     Rate and Rhythm: Normal rate and regular rhythm.     Heart sounds: No murmur heard. Pulmonary:     Effort: Pulmonary effort is normal. No respiratory distress.     Breath sounds: Normal breath sounds.  Abdominal:     Palpations: Abdomen is soft.     Tenderness: There is no abdominal tenderness. There is no guarding or rebound.  Musculoskeletal:     Cervical back: Neck supple.  Skin:    General: Skin is warm and dry.     Capillary Refill: Capillary refill takes less than 2 seconds.  Neurological:     General: No focal deficit present.     Mental Status: He is alert.    ED Results / Procedures / Treatments   Labs (all labs ordered are listed, but only abnormal results are displayed) Labs Reviewed  COMPREHENSIVE METABOLIC PANEL - Abnormal; Notable for the following components:      Result Value   Sodium 132 (*)    Chloride 96 (*)    Glucose, Bld 302 (*)    BUN 102 (*)    Creatinine, Ser 4.47 (*)    Albumin 3.4 (*)    GFR, Estimated 14 (*)    All other components within normal  limits  CBC WITH DIFFERENTIAL/PLATELET - Abnormal; Notable for the following components:   RBC 3.11 (*)    Hemoglobin 9.5 (*)    HCT 28.5 (*)    Abs Immature Granulocytes 0.21 (*)    All other components within normal limits  URINALYSIS, ROUTINE W REFLEX MICROSCOPIC - Abnormal; Notable for the following components:   Color, Urine STRAW (*)    Glucose, UA >=  500 (*)    Protein, ur 100 (*)    All other components within normal limits  HIV ANTIBODY (ROUTINE TESTING W REFLEX)  MAGNESIUM  PHOSPHORUS  RENAL FUNCTION PANEL  CBC  PROTEIN / CREATININE RATIO, URINE  SODIUM, URINE, RANDOM  CREATININE, URINE, RANDOM  PROTEIN ELECTROPHORESIS, SERUM  KAPPA/LAMBDA LIGHT CHAINS  FERRITIN  IRON AND TIBC  FOLATE  VITAMIN B12  CK    EKG EKG Interpretation  Date/Time:  Thursday September 10 2020 11:51:02 EDT Ventricular Rate:  62 PR Interval:    QRS Duration: 116 QT Interval:  426 QTC Calculation: 432 R Axis:   -13 Text Interpretation: Normal sinus rhythm with 1st degree A-V block Since last tracing PR prolonged Confirmed by Noemi Chapel [phone]) on 09/10/2020 4:49:48 PM  Radiology US Renal  Result Date: 09/10/2020 CLINICAL DATA:  Acute kidney injury EXAM: RENAL / URINARY TRACT ULTRASOUND COMPLETE COMPARISON:  None. FINDINGS: Right Kidney: Renal measurements: 11.6 x 6.6 x 6.1 cm = volume: 244.5 mL. Diffusely increased renal cortical echogenicity. No concerning renal mass, shadowing calculus or hydronephrosis. Left Kidney: Renal measurements: 13 x 7.1 x 5.8 cm = volume: 276.5 mL. Diffusely increased renal cortical echogenicity. Several small echogenic, shadowing foci are present in both kidneys, measuring up to 1.8 cm in size. Poorly visualized on sagittal imaging. Could reflect collecting system nephroliths versus vascular calcification or combination there of. No hydronephrosis. No concerning renal mass. Bladder: Appears normal for degree of bladder distention. Other: None. IMPRESSION: 1. Few  echogenic, shadowing foci present in the left kidney may reflect renal calculi or vascular calcium. No evidence of hydronephrosis. 2. Diffusely increased renal cortical echogenicity, nonspecific though can be seen with medical renal disease. Electronically Signed   By: Lovena Le M.D.   On: 09/10/2020 19:34    Procedures Procedures   Medications Ordered in ED Medications  atorvastatin (LIPITOR) tablet 10 mg (has no administration in time range)  carvedilol (COREG) tablet 25 mg (has no administration in time range)  enoxaparin (LOVENOX) injection 30 mg (has no administration in time range)  sodium chloride flush (NS) 0.9 % injection 3 mL (has no administration in time range)  acetaminophen (TYLENOL) tablet 650 mg (has no administration in time range)    Or  acetaminophen (TYLENOL) suppository 650 mg (has no administration in time range)  cloNIDine (CATAPRES) tablet 0.1 mg (has no administration in time range)  hydrALAZINE (APRESOLINE) tablet 25 mg (has no administration in time range)  hydrochlorothiazide (HYDRODIURIL) tablet 25 mg (has no administration in time range)  lactated ringers bolus 1,000 mL (0 mLs Intravenous Stopped 09/10/20 1906)    ED Course  I have reviewed the triage vital signs and the nursing notes.  Pertinent labs & imaging results that were available during my care of the patient were reviewed by me and considered in my medical decision making (see chart for details).    MDM Rules/Calculators/A&P                           65 year old male with CKD presenting to the emergency department for an AKI found on outpatient labs.  Vital signs reviewed on arrival, within acceptable limits.  He is slightly hypertensive on arrival.  Physical exam is notable for well-appearing obese male, normal mental status.  No abdominal tenderness, no flank pain.  Blood work obtained in triage notable for a creatinine of 4.4 and a BUN of over 100, markedly increased from previous outpatient  values.  Urine shows protein, but does not appear infected.  Will obtain an ultrasound of the kidneys to look for any signs of obstruction, but he states that he is making good urine.  He denies any recent medication changes.  He denies any recent nephrotoxic medication additions or dose adjustments.  He denies any recent illness symptoms.  Discussed the case with nephrology, they agree with admission to medicine to expedite work-up given the significant increase in his creatinine and BUN since previous value.  Handoff given to admitting team.  Final Clinical Impression(s) / ED Diagnoses Final diagnoses:  AKI (acute kidney injury) [facility])    Rx / [place] Orders ED Discharge Orders     None        Claud Kelp, MD 09/10/20 [place]    Noemi Chapel, MD 09/12/20 1233

## 2020-09-10 NOTE — H&P (Signed)
History and Physical   Timothy Holloway [id number] DOB: [birth year] DOA: 09/10/2020  PCP: Patient, No Pcp Per (Inactive)   Patient coming from: Home  Chief Complaint: Abnormal lab value  HPI: Timothy Holloway is a 65 y.o. male with medical history significant of hyperlipidemia, CKD, GERD, diverticulosis, OSA, diabetes send it in by his outpatient provider due to abnormal lab values. As above patient was sent in after he had blood work done in the outpatient setting and was told his creatinine was significantly elevated in the setting of known CKD.  Instructed to proceed to the ED. He states he had been feeling well and has no complaints. He denies fevers, chills, chest pain, shortness of breath, abdominal pain, constipation, diarrhea, nausea, vomiting.   ED Course: Vital signs in the ED significant for blood pressure in the [id number] to [id number] systolic, heart rate in the 50s to 60s.  Lab work-up showed CMP with sodium of 132 which corrects considering glucose of 302, chloride 96, BUN elevated to 102 and creatinine elevated to 4.47 up from baseline of around 1.5.  Albumin 3.4.  Hemoglobin 9.5.  Urinalysis showing glucose and protein only.  Renal ultrasound ordered and pending.  Nephrology was consulted and recommended observation for expedited work-up and they will see the patient.  Review of Systems: As per HPI otherwise all other systems reviewed and are negative.  Past Medical History:  Diagnosis Date   Cataract    Chronic kidney disease (CKD), stage III (moderate) ([place])    dr Timothy Holloway   Diabetes mellitus without complication ([place])    Hyperlipidemia    Hypertension    Impotence    Insomnia    Obesity     Past Surgical History:  Procedure Laterality Date   KNEE SURGERY Left 01-31-2002   SPINE SURGERY     fusion of L4-L5    Social History  reports that he does not drink alcohol and does not use drugs. No history on file for tobacco use.  Allergies  Allergen Reactions   Latex     Lodine [Etodolac]     Nausea/vomiting    Family History  Problem Relation Age of Onset   Diabetes Mother    Cancer Father        lymphoma  Reviewed on admission  Prior to Admission medications   Medication Sig Start Date End Date Taking? Authorizing Provider  amLODipine (NORVASC) 10 MG tablet Take 1 tablet (10 mg total) by mouth daily. 09/06/13   Chipper Herb, MD  aspirin 81 MG tablet Take 81 mg by mouth daily.    [provider]  atorvastatin (LIPITOR) 10 MG tablet Take 1 tablet (10 mg total) by mouth daily. 08/06/12   Vernie Shanks, MD  carvedilol (COREG CR) 80 MG 24 hr capsule Take 1 capsule (80 mg total) by mouth daily. 08/06/12   Vernie Shanks, MD  Cholecalciferol (VITAMIN D) 2000 UNITS CAPS Take 1 capsule by mouth daily.    [provider]  cyclobenzaprine (FLEXERIL) 10 MG tablet TAKE ONE TABLET BY MOUTH AT BEDTIME 01/29/13   Vernie Shanks, MD  fenofibrate (TRICOR) 145 MG tablet Take 1/2 tablet daily 09/06/13   Chipper Herb, MD  ferrous sulfate 325 (65 FE) MG tablet Take 325 mg by mouth daily with breakfast.    [provider]  furosemide (LASIX) 80 MG tablet Take 120 mg by mouth daily. 08/06/12   Vernie Shanks, MD  insulin glargine (LANTUS) 100 UNIT/ML injection Inject  90 Units into the skin at bedtime. Dr Timothy Holloway    [provider]  Liraglutide (VICTOZA) 18 MG/3ML SOPN Inject 1.8 mcg into the skin daily. 08/06/12   Vernie Shanks, MD  Multiple Vitamin (MULTIVITAMIN) tablet Take 1 tablet by mouth daily.    [provider]  valsartan (DIOVAN) 320 MG tablet Take 1 tablet (320 mg total) by mouth daily. 08/06/12   Vernie Shanks, MD  zolpidem (AMBIEN) 10 MG tablet Take 1 tablet (10 mg total) by mouth at bedtime as needed for sleep. 01/11/13   Vernie Shanks, MD    Physical Exam: Vitals:   09/10/20 1138 09/10/20 1139 09/10/20 1458 09/10/20 1830  BP: (!) 155/77  (!) 157/88 (!) 177/75  Pulse: 62  (!) 55 (!) 58  Resp:   18 18  Temp:    98.2 F (36.8 C)   SpO2: 99%  100% 100%  Weight:  134.3 kg    Height:  6' (1.829 m)     Physical Exam Constitutional:      General: He is not in acute distress.    Appearance: Normal appearance. He is obese.  HENT:     Head: Normocephalic and atraumatic.     Mouth/Throat:     Mouth: Mucous membranes are moist.     Pharynx: Oropharynx is clear.  Eyes:     Extraocular Movements: Extraocular movements intact.     Pupils: Pupils are equal, round, and reactive to light.  Cardiovascular:     Rate and Rhythm: Normal rate and regular rhythm.     Pulses: Normal pulses.     Heart sounds: Normal heart sounds.  Pulmonary:     Effort: Pulmonary effort is normal. No respiratory distress.     Breath sounds: Normal breath sounds.  Abdominal:     General: Bowel sounds are normal. There is no distension.     Palpations: Abdomen is soft.     Tenderness: There is no abdominal tenderness.  Musculoskeletal:        General: No swelling or deformity.     Right lower leg: Edema present.     Left lower leg: Edema present.  Skin:    General: Skin is warm and dry.  Neurological:     General: No focal deficit present.     Mental Status: Mental status is at baseline.   Labs on Admission: I have personally reviewed following labs and imaging studies  CBC: Recent Labs  Lab 09/10/20 1146  WBC 8.6  NEUTROABS 5.9  HGB 9.5*  HCT 28.5*  MCV 91.6  PLT 99991111    Basic Metabolic Panel: Recent Labs  Lab 09/10/20 1146  NA 132*  K 4.3  CL 96*  CO2 23  GLUCOSE 302*  BUN 102*  CREATININE 4.47*  CALCIUM 8.9    GFR: Estimated Creatinine Clearance: 22.8 mL/min (A) (by C-G formula based on SCr of 4.47 mg/dL (H)).  Liver Function Tests: Recent Labs  Lab 09/10/20 1146  AST 15  ALT 14  ALKPHOS 76  BILITOT 0.4  PROT 7.2  ALBUMIN 3.4*    Urine analysis:    Component Value Date/Time   COLORURINE STRAW (A) 09/10/2020 1147   APPEARANCEUR CLEAR 09/10/2020 1147   LABSPEC 1.007 09/10/2020  1147   PHURINE 6.0 09/10/2020 1147   GLUCOSEU >=500 (A) 09/10/2020 1147   HGBUR NEGATIVE 09/10/2020 1147   [place] 09/10/2020 1147   BILIRUBINUR neg 01/10/2013 [place] 09/10/2020 1147   PROTEINUR 100 (  A) 09/10/2020 1147   UROBILINOGEN negative 01/10/2013 1032   NITRITE NEGATIVE 09/10/2020 1147   LEUKOCYTESUR NEGATIVE 09/10/2020 1147    Radiological Exams on Admission: US Renal  Result Date: 09/10/2020 CLINICAL DATA:  Acute kidney injury EXAM: RENAL / URINARY TRACT ULTRASOUND COMPLETE COMPARISON:  None. FINDINGS: Right Kidney: Renal measurements: 11.6 x 6.6 x 6.1 cm = volume: 244.5 mL. Diffusely increased renal cortical echogenicity. No concerning renal mass, shadowing calculus or hydronephrosis. Left Kidney: Renal measurements: 13 x 7.1 x 5.8 cm = volume: 276.5 mL. Diffusely increased renal cortical echogenicity. Several small echogenic, shadowing foci are present in both kidneys, measuring up to 1.8 cm in size. Poorly visualized on sagittal imaging. Could reflect collecting system nephroliths versus vascular calcification or combination there of. No hydronephrosis. No concerning renal mass. Bladder: Appears normal for degree of bladder distention. Other: None. IMPRESSION: 1. Few echogenic, shadowing foci present in the left kidney may reflect renal calculi or vascular calcium. No evidence of hydronephrosis. 2. Diffusely increased renal cortical echogenicity, nonspecific though can be seen with medical renal disease. Electronically Signed   By: Lovena Le M.D.   On: 09/10/2020 19:34    EKG: Independently reviewed.  Sinus rhythm with sinus arrhythmia and low voltage P waves.  Computer read as A. fib does appear to have first-degree AV block.   Assessment/Plan Active Problems:   HLD (hyperlipidemia)   HTN (hypertension)   Diabetes mellitus ([place])   Acute renal failure superimposed on stage 3a chronic kidney disease (HCC)  Acute renal failure on CKD > Known  CKD outpatient had recent labs done and found to have creatinine elevated at greater than 4 from baseline of 1.5.  Confirmed to be elevated at 4.47 in the ED here. > Electrolytes stable.  BUN elevated to 102.  No indication for any urgent dialysis.  Nephrology consulted in the ED and will see the patient. - Appreciate nephrology recommendations, as they are seeing the patient this evening we will let them guide additional lab work-up and recommendations on possible diuresis. - Follow-up urinalysis - Avoid nephrotoxic agents - Trend renal function and electrolytes - Nephrology plan rule out myeloma and further work-up based on urinalysis  Diabetes > On 80 units of Levemir qhs and 10 units of short acting with meals -We will start on Levemir 50 units daily with SSI   Hyperlipidemia - Continue home atorvastatin  Hypertension - Continue home carvedilol and clonidine - Holding home losartan of AKI as above  - Continue home hydrochlorothiazide and add on hydralazine as per nephrology.  Insomnia Anxiety - Continue home Lexapro   DVT prophylaxis: Lovenox  Code Status:   Full  Family Communication:  Family updated at bedside Disposition Plan:   Patient is from:  Home  Anticipated DC to:  Home  Anticipated DC date:  1 to 3 days  Anticipated DC barriers: None  Consults called:  Nephrology consulted by EDP, they are seeing the patient.   Admission status:  Observation, telemetry  Severity of Illness: The appropriate patient status for this patient is OBSERVATION. Observation status is judged to be reasonable and necessary in order to provide the required intensity of service to ensure the patient's safety. The patient's presenting symptoms, physical exam findings, and initial radiographic and laboratory data in the context of their medical condition is felt to place them at decreased risk for further clinical deterioration. Furthermore, it is anticipated that the patient will be medically  stable for discharge from the hospital within 2 midnights  of admission. The following factors support the patient status of observation.   " The patient's presenting symptoms include abnormal lab values.. " The physical exam findings include edema, obesity. " The initial radiographic and laboratory data are Lab work-up showed CMP with sodium of 132 which corrects considering glucose of 302, chloride 96, BUN elevated to 102 and creatinine elevated to 4.47 up from baseline of around 1.5.  Albumin 3.4.  Hemoglobin 9.5.  Urinalysis showing glucose and protein only.  Renal ultrasound ordered and pending.     Marcelyn Bruins MD [facility]  How to contact the [facility] Attending or Consulting provider [place] or covering provider during after hours [place], for this patient?   Check the care team in [facility] and look for a) attending/consulting [facility] provider listed and b) the [facility] team listed Log into [email or web address] and use [place]'s universal password to access. If you do not have the password, please contact the hospital operator. Locate the [facility] provider you are looking for under [facility] and page to a number that you can be directly reached. If you still have difficulty reaching the provider, please page the [facility] (Director on Call) for the Hospitalists listed on amion for assistance.  09/10/2020, 8:48 PM

## 2020-09-10 NOTE — ED Provider Notes (Signed)
I saw and evaluated the patient, reviewed the resident's note and I agree with the findings and plan.  Pertinent History: This patient is a 65 year old male who is asymptomatic coming in at the request of his family doctor who he saw yesterday for an quarterly wellness exam.  They did blood work and called him today stating that he had some kidney abnormalities.  Again the patient is asymptomatic without nausea, he urinates frequently but states he was added hydrochlorothiazide within the last 6 months.  He no longer takes amlodipine because of ankle swelling.  Pertinent Exam findings: On exam the patient does have 1+ symmetrical pitting edema of the lower extremities, abdomen is nontender, heart and lung exams are unremarkable without tachycardia or rales, mental status is totally clear, he is able to answer all questions follow all commands and is totally normal memory.  View of care everywhere shows that the patient had a bump in his creatinine back in May 2022 from 1.8-3.5, it was measured yesterday at 3.8 with a BUN that was overall 100 again the patient was asymptomatic  I was personally present and directly supervised the following procedures:  Medical evaluation, consult with nephrology  I personally interpreted the EKG as well as the resident and agree with the interpretation on the resident's chart.  Final diagnoses:  None    EKG Interpretation  Date/Time:    Ventricular Rate:    PR Interval:    QRS Duration:   QT Interval:    QTC Calculation:   R Axis:     Text Interpretation:            Noemi Chapel, MD 09/12/20 1233

## 2020-09-10 NOTE — Consult Note (Addendum)
[facility]  INPATIENT CONSULTATION  Reason for Consultation: AKI on CKD Requesting Provider: Dr Sabra Heck  HPI: Timothy Holloway is an 65 y.o. male with DM (no DR or DPN), HTN, HL, obesity, DJD, CKD 3 who presented to the ED today for abnormal labs and nephrology is consulted for evaluation and management of AKI on CKD.    Creatinine baseline noted to be 1.3-1.'7mg'$ /dL per records.  In 10/2019 was 1.8 > 05/2020 Cr 3.5 and 09/09/2020 4.7 with PCP who dispatched him to ED for eval. Associated 09/09/2020 labs K 5, Bicarb 21, T prot 6.5, Alb 3.9, LFTs normal, Hb 9.1 from 9.7 in 03/2020, MCV 89, PSA 0.6.  A1c 05/2020 8.8 and 08/2020 14.3.    He is seen with his wife in the ED.  He's been in his usual state of health lately but has taken a new job this year in which he's very sedentary.  He had been quite active at work previously and losing weight but this has really stagnated.  He has no recently new symptoms or acute illnesses.  Denies new LUTs and really has only chronic nocturia q1.5-2hrs.  Has ankle edema only today; not preceding ED.  No hematuria, tea or cola colored urine, no foamy/frothy urine, no flank pain, no f/c/night sweats.  No eye, ear, oral issues, no GI issues or diarrhea.  Home BPs 140s - in the ED he's 190s after missed meds and anxiety. Doesn't feel dehydrated and no NSAID use.  No recent contrast exposure.    He was given 1L LR bolus in the ED.  Developed ankle edema since he's been here.   Pt follows with Dr. Moshe Cipro, last visit 10/01/19.  At that time his renal function on 08/3019 check was stable at 1.56.  UACR ~1100 in 10/2017.   PMH: Past Medical History:  Diagnosis Date   Cataract    Chronic kidney disease (CKD), stage III (moderate) ([place])    dr Buddy Duty   Diabetes mellitus without complication ([place])    Hyperlipidemia    Hypertension    Impotence    Insomnia    Obesity    Past Medical History:  Diagnosis Date   Cataract    Chronic kidney disease (CKD),  stage III (moderate) ([place])    dr Buddy Duty   Diabetes mellitus without complication ([place])    Hyperlipidemia    Hypertension    Impotence    Insomnia    Obesity     Medications:  I have reviewed the patient's current medications. On ASA '81mg'$  Losartan 100, HCTZ 25 daily, lasix 80 daily, coreg 25 BID, clonidine 0.1 BID Farxiga 10 daily insulin  (Not in a hospital admission)   ALLERGIES:   Allergies  Allergen Reactions   Latex    Lodine [Etodolac]     Nausea/vomiting    FAM HX: Family History  Problem Relation Age of Onset   Diabetes Mother    Cancer Father        lymphoma    Social History:   reports that he does not drink alcohol and does not use drugs. No history on file for tobacco use.  ROS: 12 system ROS neg except per HPI  Blood pressure (!) 177/75, pulse (!) 58, temperature 98.2 F (36.8 C), resp. rate 18, height 6' (1.829 m), weight 134.3 kg, SpO2 100 %. PHYSICAL EXAM: Gen: obese man who is comfortable in stretcher  Eyes: anicteric, conjunctiva not injected ENT: MMM,  no oral ulcers Neck: thick, supple CV:  brady and regular, no rub Abd: soft obese Lungs: clear throughout GU: no foley, bladder not palpable Extr:  1+ ankle edema Neuro: no asterixis, normally conversant Skin: no rashes or lesions noted   Results for orders placed or performed during the hospital encounter of 09/10/20 (from the past 48 hour(s))  Comprehensive metabolic panel     Status: Abnormal   Collection Time: 09/10/20 11:46 AM  Result Value Ref Range   Sodium 132 (L) 135 - 145 mmol/L   Potassium 4.3 3.5 - 5.1 mmol/L   Chloride 96 (L) 98 - 111 mmol/L   CO2 23 22 - 32 mmol/L   Glucose, Bld 302 (H) 70 - 99 mg/dL    Comment: Glucose reference range applies only to samples taken after fasting for at least 8 hours.   BUN 102 (H) 8 - 23 mg/dL   Creatinine, Ser 4.47 (H) 0.61 - 1.24 mg/dL   Calcium 8.9 8.9 - 10.3 mg/dL   Total Protein 7.2 6.5 - 8.1 g/dL   Albumin 3.4 (L) 3.5 - 5.0 g/dL    AST 15 15 - 41 U/L   ALT 14 0 - 44 U/L   Alkaline Phosphatase 76 38 - 126 U/L   Total Bilirubin 0.4 0.3 - 1.2 mg/dL   GFR, Estimated 14 (L) >60 mL/min    Comment: (NOTE) Calculated using the CKD-EPI Creatinine Equation (2021)    Anion gap 13 5 - 15    Comment: Performed at [facility] [address], [place], [place] [address]  CBC with Differential     Status: Abnormal   Collection Time: 09/10/20 11:46 AM  Result Value Ref Range   WBC 8.6 4.0 - 10.5 K/uL   RBC 3.11 (L) 4.22 - 5.81 MIL/uL   Hemoglobin 9.5 (L) 13.0 - 17.0 g/dL   HCT 28.5 (L) 39.0 - 52.0 %   MCV 91.6 80.0 - 100.0 fL   MCH 30.5 26.0 - 34.0 pg   MCHC 33.3 30.0 - 36.0 g/dL   RDW 12.9 11.5 - 15.5 %   Platelets 263 150 - 400 K/uL   nRBC 0.0 0.0 - 0.2 %   Neutrophils Relative % 67 %   Neutro Abs 5.9 1.7 - 7.7 K/uL   Lymphocytes Relative 15 %   Lymphs Abs 1.3 0.7 - 4.0 K/uL   Monocytes Relative 10 %   Monocytes Absolute 0.8 0.1 - 1.0 K/uL   Eosinophils Relative 4 %   Eosinophils Absolute 0.3 0.0 - 0.5 K/uL   Basophils Relative 1 %   Basophils Absolute 0.0 0.0 - 0.1 K/uL   Immature Granulocytes 3 %   Abs Immature Granulocytes 0.21 (H) 0.00 - 0.07 K/uL    Comment: Performed at [facility] [address] [place], [place] [address]  Urinalysis, Routine w reflex microscopic Urine, Clean Catch     Status: Abnormal   Collection Time: 09/10/20 11:47 AM  Result Value Ref Range   Color, Urine STRAW (A) YELLOW   APPearance CLEAR CLEAR   Specific Gravity, Urine 1.007 1.005 - 1.030   pH 6.0 5.0 - 8.0   Glucose, UA >=500 (A) NEGATIVE mg/dL   Hgb urine dipstick NEGATIVE NEGATIVE   Bilirubin Urine NEGATIVE NEGATIVE   Ketones, ur NEGATIVE NEGATIVE mg/dL   Protein, ur 100 (A) NEGATIVE mg/dL   Nitrite NEGATIVE NEGATIVE   Leukocytes,Ua NEGATIVE NEGATIVE   RBC / HPF 0-5 0 - 5 RBC/hpf   WBC, UA 0-5 0 - 5 WBC/hpf  Bacteria, UA NONE SEEN NONE SEEN    Comment: Performed at [facility] [address], [place], [place] [address]    No results found.  Assessment/Plan **AKI on CKD 3: pt with baseline Cr 1.5-1.'7mg'$ /dL presumed HTN/DM related now with uptrend really 1st noted in 05/2020 to 3.5 and now 4.7.  By history no clear etiology for the AKI.  He doesn't appear hypovolemic.  UA and renal US are ordered.  Will r/o myeloma with anemia and rhabdo with statin.  If UA suggestive of GN will order full serologic evaluation and may need to proceed with renal biopsy.  Hold ARB, valtrex and SGLT2i for now.  I don't think he needs fluids and we can probably resume his home lasix tomorrow.  No current indications for RRT but if necessary he'd do it. Follow I/Os, daily weights. Avoid nephrotoxins.  Daily labs.   **HTN:  decent control typically per home readings, high today in ED in setting of missed meds.  Resume home meds except losartan 100.  Will sub hydralazine 25 TID and titrate PRN.   **DM:  had generally been well controlled but check 08/2020 with A1c 14+.  He's been a lot more sedentary at his new job - says adherent to meds as he always had been.  Med titration per primary.  **Anemia, normocytic:  Transfuse <7, check iron, B12, folate.  R/o myeloma.   **HL: ok to cont statin for now, check CK to make sure by no c/o myalgias.   Will follow, call with concerns.   Justin Mend 09/10/2020, 6:50 PM

## 2020-09-10 NOTE — ED Triage Notes (Signed)
Per pt: he had blood work done yesterday at a PCP and they told him today that his creatinine was "really high". Pt had CKD stage III. Pt is not on dialysis. Pt unsure what the creatinine, does not know what his baseline is. Nephrologist is at [facility] on [address] in [place]. No new changes or any obvious problems per pt. Pt in no acute distress VSS in triage.

## 2020-09-10 NOTE — ED Provider Notes (Signed)
Emergency Medicine Provider Triage Evaluation Note  Timothy Holloway , a 65 y.o. male  was evaluated in triage.  Pt complains of elevated creatinine and abnormal lab work.  His blood work was done at [facility].  His nephrologist is at the kidney center on [address] in [place].  States that his PCP did blood work yesterday, called him today saying his creatinine was elevated and recommended that he come in.  He does not know what it was.  Review shows that yesterday his creatinine was 4.70 with a potassium of 5, lab work from 06/10/2020 shows a creatinine of 3.5 at that time.  Review of Systems  Positive: Fevers, shortness of breath Negative: weakness  Physical Exam  BP (!) 155/77   Pulse 62   Temp 98.7 F (37.1 C)   Resp 14   Ht 6' (1.829 m)   Wt 134.3 kg   SpO2 99%   BMI 40.14 kg/m  Gen:   Awake, no distress   Resp:  Normal effort  MSK:   Moves extremities without difficulty  Other:  Patient is awake and alert.  Answers questions appropriately.  Speech is nonslurred.  Medical Decision Making  Medically screening exam initiated at 11:47 AM.  Appropriate orders placed.  Clarene Essex Wattenbarger was informed that the remainder of the evaluation will be completed by another provider, this initial triage assessment does not replace that evaluation, and the importance of remaining in the ED until their evaluation is complete.  Given that yesterday patient's creatinine was 5.0 in the setting of worsening kidney function will obtain repeat labs and EKG in case he is hyperkalemic.  Note: Portions of this report may have been transcribed using voice recognition software. Every effort was made to ensure accuracy; however, inadvertent computerized transcription errors may be present    Ollen Gross 09/10/20 1150    Truddie Hidden, MD 09/10/20 1157

## 2020-09-10 NOTE — ED Notes (Signed)
Pt ambulated to restroom with steady gait.

## 2020-09-11 ENCOUNTER — Encounter (HOSPITAL_COMMUNITY): Payer: Self-pay | Admitting: Internal Medicine

## 2020-09-11 DIAGNOSIS — Z833 Family history of diabetes mellitus: Secondary | ICD-10-CM | POA: Diagnosis not present

## 2020-09-11 DIAGNOSIS — N1831 Chronic kidney disease, stage 3a: Secondary | ICD-10-CM | POA: Diagnosis not present

## 2020-09-11 DIAGNOSIS — Z888 Allergy status to other drugs, medicaments and biological substances status: Secondary | ICD-10-CM | POA: Diagnosis not present

## 2020-09-11 DIAGNOSIS — Z20822 Contact with and (suspected) exposure to covid-19: Secondary | ICD-10-CM | POA: Diagnosis present

## 2020-09-11 DIAGNOSIS — E1122 Type 2 diabetes mellitus with diabetic chronic kidney disease: Secondary | ICD-10-CM | POA: Diagnosis present

## 2020-09-11 DIAGNOSIS — G4700 Insomnia, unspecified: Secondary | ICD-10-CM | POA: Diagnosis present

## 2020-09-11 DIAGNOSIS — N179 Acute kidney failure, unspecified: Secondary | ICD-10-CM | POA: Diagnosis present

## 2020-09-11 DIAGNOSIS — E876 Hypokalemia: Secondary | ICD-10-CM | POA: Diagnosis present

## 2020-09-11 DIAGNOSIS — E785 Hyperlipidemia, unspecified: Secondary | ICD-10-CM | POA: Diagnosis present

## 2020-09-11 DIAGNOSIS — G4733 Obstructive sleep apnea (adult) (pediatric): Secondary | ICD-10-CM | POA: Diagnosis present

## 2020-09-11 DIAGNOSIS — Z6841 Body Mass Index (BMI) 40.0 and over, adult: Secondary | ICD-10-CM | POA: Diagnosis not present

## 2020-09-11 DIAGNOSIS — D631 Anemia in chronic kidney disease: Secondary | ICD-10-CM | POA: Diagnosis present

## 2020-09-11 DIAGNOSIS — Z807 Family history of other malignant neoplasms of lymphoid, hematopoietic and related tissues: Secondary | ICD-10-CM | POA: Diagnosis not present

## 2020-09-11 DIAGNOSIS — Z91040 Latex allergy status: Secondary | ICD-10-CM | POA: Diagnosis not present

## 2020-09-11 DIAGNOSIS — Z79899 Other long term (current) drug therapy: Secondary | ICD-10-CM | POA: Diagnosis not present

## 2020-09-11 DIAGNOSIS — R600 Localized edema: Secondary | ICD-10-CM | POA: Diagnosis present

## 2020-09-11 DIAGNOSIS — N1832 Chronic kidney disease, stage 3b: Secondary | ICD-10-CM | POA: Diagnosis present

## 2020-09-11 DIAGNOSIS — E1165 Type 2 diabetes mellitus with hyperglycemia: Secondary | ICD-10-CM | POA: Diagnosis present

## 2020-09-11 DIAGNOSIS — E669 Obesity, unspecified: Secondary | ICD-10-CM | POA: Diagnosis present

## 2020-09-11 DIAGNOSIS — I129 Hypertensive chronic kidney disease with stage 1 through stage 4 chronic kidney disease, or unspecified chronic kidney disease: Secondary | ICD-10-CM | POA: Diagnosis present

## 2020-09-11 DIAGNOSIS — N184 Chronic kidney disease, stage 4 (severe): Secondary | ICD-10-CM | POA: Diagnosis not present

## 2020-09-11 DIAGNOSIS — K219 Gastro-esophageal reflux disease without esophagitis: Secondary | ICD-10-CM | POA: Diagnosis present

## 2020-09-11 DIAGNOSIS — R001 Bradycardia, unspecified: Secondary | ICD-10-CM | POA: Diagnosis not present

## 2020-09-11 DIAGNOSIS — F419 Anxiety disorder, unspecified: Secondary | ICD-10-CM | POA: Diagnosis present

## 2020-09-11 DIAGNOSIS — E871 Hypo-osmolality and hyponatremia: Secondary | ICD-10-CM | POA: Diagnosis present

## 2020-09-11 DIAGNOSIS — D509 Iron deficiency anemia, unspecified: Secondary | ICD-10-CM | POA: Diagnosis present

## 2020-09-11 LAB — CBC
HCT: 26 % — ABNORMAL LOW (ref 39.0–52.0)
Hemoglobin: 8.3 g/dL — ABNORMAL LOW (ref 13.0–17.0)
MCH: 30 pg (ref 26.0–34.0)
MCHC: 31.9 g/dL (ref 30.0–36.0)
MCV: 93.9 fL (ref 80.0–100.0)
Platelets: 223 10*3/uL (ref 150–400)
RBC: 2.77 MIL/uL — ABNORMAL LOW (ref 4.22–5.81)
RDW: 13 % (ref 11.5–15.5)
WBC: 9.2 10*3/uL (ref 4.0–10.5)
nRBC: 0 % (ref 0.0–0.2)

## 2020-09-11 LAB — SARS CORONAVIRUS 2 (TAT 6-24 HRS): SARS Coronavirus 2: NEGATIVE

## 2020-09-11 LAB — RENAL FUNCTION PANEL
Albumin: 2.8 g/dL — ABNORMAL LOW (ref 3.5–5.0)
Anion gap: 11 (ref 5–15)
BUN: 91 mg/dL — ABNORMAL HIGH (ref 8–23)
CO2: 21 mmol/L — ABNORMAL LOW (ref 22–32)
Calcium: 8.3 mg/dL — ABNORMAL LOW (ref 8.9–10.3)
Chloride: 101 mmol/L (ref 98–111)
Creatinine, Ser: 4.23 mg/dL — ABNORMAL HIGH (ref 0.61–1.24)
GFR, Estimated: 15 mL/min — ABNORMAL LOW (ref >60)
Glucose, Bld: 259 mg/dL — ABNORMAL HIGH (ref 70–99)
Phosphorus: 5.1 mg/dL — ABNORMAL HIGH (ref 2.5–4.6)
Potassium: 3.3 mmol/L — ABNORMAL LOW (ref 3.5–5.1)
Sodium: 133 mmol/L — ABNORMAL LOW (ref 135–145)

## 2020-09-11 LAB — CREATININE, URINE, RANDOM: Creatinine, Urine: 24.47 mg/dL

## 2020-09-11 LAB — GLUCOSE, CAPILLARY
Glucose-Capillary: 111 mg/dL — ABNORMAL HIGH (ref 70–99)
Glucose-Capillary: 315 mg/dL — ABNORMAL HIGH (ref 70–99)
Glucose-Capillary: 310 mg/dL — ABNORMAL HIGH (ref 70–99)
Glucose-Capillary: 273 mg/dL — ABNORMAL HIGH (ref 70–99)

## 2020-09-11 LAB — MAGNESIUM: Magnesium: 2.5 mg/dL — ABNORMAL HIGH (ref 1.7–2.4)

## 2020-09-11 LAB — PROTEIN / CREATININE RATIO, URINE
Creatinine, Urine: 24.22 mg/dL
Protein Creatinine Ratio: 3.63 mg/mg{Cre} — ABNORMAL HIGH (ref 0.00–0.15)
Total Protein, Urine: 88 mg/dL

## 2020-09-11 LAB — FERRITIN: Ferritin: 846 ng/mL — ABNORMAL HIGH (ref 24–336)

## 2020-09-11 LAB — HEMOGLOBIN A1C
Hgb A1c MFr Bld: 13.4 % — ABNORMAL HIGH (ref 4.8–5.6)
Mean Plasma Glucose: 337.88 mg/dL

## 2020-09-11 LAB — IRON AND TIBC
Iron: 51 ug/dL (ref 45–182)
Saturation Ratios: 14 % — ABNORMAL LOW (ref 17.9–39.5)
TIBC: 353 ug/dL (ref 250–450)
UIBC: 302 ug/dL

## 2020-09-11 LAB — HIV ANTIBODY (ROUTINE TESTING W REFLEX): HIV Screen 4th Generation wRfx: NONREACTIVE

## 2020-09-11 LAB — VITAMIN B12: Vitamin B-12: 757 pg/mL (ref 180–914)

## 2020-09-11 LAB — CK: Total CK: 78 U/L (ref 49–397)

## 2020-09-11 LAB — PHOSPHORUS: Phosphorus: 5.9 mg/dL — ABNORMAL HIGH (ref 2.5–4.6)

## 2020-09-11 LAB — FOLATE: Folate: 25.8 ng/mL (ref >5.9)

## 2020-09-11 LAB — SODIUM, URINE, RANDOM: Sodium, Ur: 76 mmol/L

## 2020-09-11 MED ORDER — FERROUS SULFATE 325 (65 FE) MG PO TABS
325.0000 mg | ORAL_TABLET | Freq: Every day | ORAL | Status: DC
  Administered 2020-09-12 – 2020-09-13 (×2): 325 mg via ORAL
  Filled 2020-09-11 (×2): qty 1

## 2020-09-11 MED ORDER — SODIUM CHLORIDE 0.9 % IV BOLUS
1000.0000 mL | Freq: Once | INTRAVENOUS | Status: AC
  Administered 2020-09-11: 1000 mL via INTRAVENOUS

## 2020-09-11 MED ORDER — SODIUM CHLORIDE 0.9 % IV SOLN: INTRAVENOUS | Status: DC

## 2020-09-11 MED ORDER — POTASSIUM CHLORIDE CRYS ER 20 MEQ PO TBCR
20.0000 meq | EXTENDED_RELEASE_TABLET | Freq: Once | ORAL | Status: AC
  Administered 2020-09-11: 20 meq via ORAL
  Filled 2020-09-11: qty 1

## 2020-09-11 MED ORDER — LIVING WELL WITH DIABETES BOOK
Freq: Once | Status: AC
  Filled 2020-09-11: qty 1

## 2020-09-11 MED ORDER — CARVEDILOL 3.125 MG PO TABS
3.1250 mg | ORAL_TABLET | Freq: Two times a day (BID) | ORAL | Status: DC
  Administered 2020-09-11 – 2020-09-12 (×4): 3.125 mg via ORAL
  Filled 2020-09-11 (×5): qty 1

## 2020-09-11 MED ORDER — INSULIN ASPART 100 UNIT/ML IJ SOLN
4.0000 [IU] | Freq: Three times a day (TID) | INTRAMUSCULAR | Status: DC
  Administered 2020-09-11 – 2020-09-13 (×6): 4 [IU] via SUBCUTANEOUS

## 2020-09-11 NOTE — Progress Notes (Signed)
Patient ID: Timothy Holloway, male   DOB: [birth year], 65 y.o.   MRN: [id number]  PROGRESS NOTE    HALL ASHBECK  [id number] DOB: [birth year] DOA: 09/10/2020 PCP: Patient, No Pcp Per (Inactive)   Brief Narrative:  65 y.o. male with medical history significant of hyperlipidemia, CKD, GERD, diverticulosis, OSA, diabetes was sent in by outpatient provider for abnormal lab values.  On presentation, creatinine was 4.47, up from baseline of around 1.5; sodium 132.  Nephrology was consulted. He was started on IV fluids.  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  Presented with creatinine of 4.47. -Renal ultrasound negative for hydronephrosis -Nephrology following.  Continue IV fluids.  Creatinine only slightly improved to 4.23 today.  Follow nephrology recommendations.  Hold nephrotoxic medications including home losartan, Valtrex. -Strict input and output.  Daily weights.  Diabetes mellitus type 2 with hyperglycemia next-A1c 13.4.  Continue Levemir along with CBGs with SSI  Hypertension -Continue Coreg and clonidine.  ARB on hold.  Blood pressure intermittently on the higher side.  Insomnia/anxiety -Continue Lexapro  Hyperlipidemia -Continue atorvastatin  Obesity -Outpatient follow-up  Anemia of chronic disease -From renal failure.  Hemoglobin stable.  No signs of bleeding.  Monitor  Hyponatremia -Mild.  Continue IV fluids.  Monitor  Hypokalemia -Mild.  Repeat a.m. labs   DVT prophylaxis: Lovenox Code Status: Full Family Communication: None at bedside Disposition Plan: Status is: Observation  The patient will require care spanning > 2 midnights and should be moved to inpatient because: Inpatient level of care appropriate due to severity of illness  Dispo: The patient is from: Home              Anticipated d/c is to: Home              Patient currently is not medically stable to d/c.   Difficult to place patient  No  Consultants: Nephrology  Procedures: None  Antimicrobials: None   Subjective: Patient seen and examined at bedside.  Denies worsening shortness of breath, chest pain, nausea, vomiting.  He is making urine.  Objective: Vitals:   09/11/20 0430 09/11/20 0441 09/11/20 0522 09/11/20 0805  BP: (!) 100/54  (!) 166/76 139/75  Pulse: (!) 58  (!) 56 (!) 59  Resp: '16  17 14  '$ Temp:  98.3 F (36.8 C) 98.1 F (36.7 C) 98.5 F (36.9 C)  TempSrc:  Oral Oral Oral  SpO2: 93%  96% 98%  Weight:   132.6 kg   Height:   6' (1.829 m)     Intake/Output Summary (Last 24 hours) at 09/11/2020 1033 Last data filed at 09/11/2020 0858 Gross per 24 hour  Intake 2096.3 ml  Output 700 ml  Net 1396.3 ml   Filed Weights   09/10/20 1139 09/11/20 0522  Weight: 134.3 kg 132.6 kg    Examination:  General exam: Appears calm and comfortable.  Currently on room air. Respiratory system: Bilateral decreased breath sounds at bases Cardiovascular system: S1 & S2 heard, intermittently bradycardic Gastrointestinal system: Abdomen is obese, nondistended, soft and nontender. Normal bowel sounds heard. Extremities: No cyanosis, clubbing; trace lower extremity edema Central nervous system: Alert and oriented. No focal neurological deficits. Moving extremities Skin: No rashes, lesions or ulcers Psychiatry: Judgement and insight appear normal. Mood & affect appropriate.     Data Reviewed: I have personally reviewed following labs and imaging studies  CBC: Recent Labs  Lab 09/10/20 1146 09/11/20 0210  WBC 8.6 9.2  NEUTROABS 5.9  --   HGB 9.5* 8.3*  HCT 28.5* 26.0*  MCV 91.6 93.9  PLT 263 [id number]   Basic Metabolic Panel: Recent Labs  Lab 09/10/20 1146 09/10/20 1929 09/11/20 0210  NA 132*  --  133*  K 4.3  --  3.3*  CL 96*  --  101  CO2 23  --  21*  GLUCOSE 302*  --  259*  BUN 102*  --  91*  CREATININE 4.47*  --  4.23*  CALCIUM 8.9  --  8.3*  MG  --  2.5*  --   PHOS  --  5.9* 5.1*    GFR: Estimated Creatinine Clearance: 23.9 mL/min (A) (by C-G formula based on SCr of 4.23 mg/dL (H)). Liver Function Tests: Recent Labs  Lab 09/10/20 1146 09/11/20 0210  AST 15  --   ALT 14  --   ALKPHOS 76  --   BILITOT 0.4  --   PROT 7.2  --   ALBUMIN 3.4* 2.8*   No results for input(s): LIPASE, AMYLASE in the last 168 hours. No results for input(s): AMMONIA in the last 168 hours. Coagulation Profile: No results for input(s): INR, PROTIME in the last 168 hours. Cardiac Enzymes: Recent Labs  Lab 09/10/20 1929  CKTOTAL 78   BNP (last 3 results) No results for input(s): PROBNP in the last 8760 hours. HbA1C: Recent Labs    09/10/20 2327  HGBA1C 13.4*   CBG: Recent Labs  Lab 09/10/20 2324 09/11/20 0809  GLUCAP 287* 111*   Lipid Profile: No results for input(s): CHOL, HDL, LDLCALC, TRIG, CHOLHDL, LDLDIRECT in the last 72 hours. Thyroid Function Tests: No results for input(s): TSH, T4TOTAL, FREET4, T3FREE, THYROIDAB in the last 72 hours. Anemia Panel: Recent Labs    09/11/20 0210  VITAMINB12 757  FOLATE 25.8  FERRITIN 846*  TIBC 353  IRON 51   Sepsis Labs: No results for input(s): PROCALCITON, LATICACIDVEN in the last 168 hours.  No results found for this or any previous visit (from the past 240 hour(s)).       Radiology Studies: US Renal  Result Date: 09/10/2020 CLINICAL DATA:  Acute kidney injury EXAM: RENAL / URINARY TRACT ULTRASOUND COMPLETE COMPARISON:  None. FINDINGS: Right Kidney: Renal measurements: 11.6 x 6.6 x 6.1 cm = volume: 244.5 mL. Diffusely increased renal cortical echogenicity. No concerning renal mass, shadowing calculus or hydronephrosis. Left Kidney: Renal measurements: 13 x 7.1 x 5.8 cm = volume: 276.5 mL. Diffusely increased renal cortical echogenicity. Several small echogenic, shadowing foci are present in both kidneys, measuring up to 1.8 cm in size. Poorly visualized on sagittal imaging. Could reflect collecting system  nephroliths versus vascular calcification or combination there of. No hydronephrosis. No concerning renal mass. Bladder: Appears normal for degree of bladder distention. Other: None. IMPRESSION: 1. Few echogenic, shadowing foci present in the left kidney may reflect renal calculi or vascular calcium. No evidence of hydronephrosis. 2. Diffusely increased renal cortical echogenicity, nonspecific though can be seen with medical renal disease. Electronically Signed   By: Lovena Le M.D.   On: 09/10/2020 19:34        Scheduled Meds:  atorvastatin  10 mg Oral Daily   carvedilol  3.125 mg Oral BID WC   cloNIDine  0.1 mg Oral BID   enoxaparin (LOVENOX) injection  30 mg Subcutaneous Q24H   escitalopram  10 mg Oral Daily   insulin aspart  0-9 Units Subcutaneous TID WC   insulin detemir  50 Units Subcutaneous  QHS   sodium chloride flush  3 mL Intravenous Q12H   Continuous Infusions:  sodium chloride 50 mL/hr at 09/11/20 0525          Aline August, MD [facility] 09/11/2020, 10:33 AM

## 2020-09-11 NOTE — ED Notes (Signed)
Pt was sleep at the bedside. I woke pt up to assess LOC. He AOx4.

## 2020-09-11 NOTE — Progress Notes (Signed)
[facility] Progress Note  Name: Timothy Holloway MRN: [id number] DOB: 03/26/1955   Subjective:  Feels good.  had 1.4 liters UOP over 8/11.  Spoke with patient and his wife at bedside.  He denies home NSAID use.  States no difficulty with urination   Review of systems:  Denies n/v Denies chest pain or shortness of breath  --------------  Timothy Holloway is an 65 y.o. male with DM (no DR or DPN), HTN, HL, obesity, DJD, CKD 3 who presented to the ED today for abnormal labs and nephrology is consulted for evaluation and management of AKI on CKD.    Creatinine baseline noted to be 1.3-1.'7mg'$ /dL per records.  In 10/2019 was 1.8 > 05/2020 Cr 3.5 and 09/09/2020 4.7 with PCP who dispatched him to ED for eval. Associated 09/09/2020 labs K 5, Bicarb 21, T prot 6.5, Alb 3.9, LFTs normal, Hb 9.1 from 9.7 in 03/2020, MCV 89, PSA 0.6.  A1c 05/2020 8.8 and 08/2020 14.3.  He is seen with his wife in the ED.  He's been in his usual state of health lately but has taken a new job this year in which he's very sedentary.  He had been quite active at work previously and losing weight but this has really stagnated.  He has no recently new symptoms or acute illnesses.  Denies new LUTs and really has only chronic nocturia q1.5-2hrs.  Has ankle edema only today; not preceding ED.  No hematuria, tea or cola colored urine, no foamy/frothy urine, no flank pain, no f/c/night sweats.  No eye, ear, oral issues, no GI issues or diarrhea.  Home BPs 140s - in the ED he's 190s after missed meds and anxiety. Doesn't feel dehydrated and no NSAID use.  No recent contrast exposure.  Pt follows with Dr. Moshe Cipro, last visit 10/01/19.  At that time his renal function on 08/3019 check was stable at 1.56.  UACR ~1100 in 10/2017.    Intake/Output Summary (Last 24 hours) at 09/11/2020 1511 Last data filed at 09/11/2020 1300 Gross per 24 hour  Intake 2096.3 ml  Output 1375 ml  Net 721.3 ml    Vitals:  Vitals:   09/11/20  0441 09/11/20 0522 09/11/20 0805 09/11/20 1334  BP:  (!) 166/76 139/75 (!) 141/63  Pulse:  (!) 56 (!) 59 65  Resp:  '17 14 18  '$ Temp: 98.3 F (36.8 C) 98.1 F (36.7 C) 98.5 F (36.9 C) 98.5 F (36.9 C)  TempSrc: Oral Oral Oral Oral  SpO2:  96% 98% 97%  Weight:  132.6 kg    Height:  6' (1.829 m)       Physical Exam:  General adult male in bed in no acute distress HEENT normocephalic atraumatic extraocular movements intact sclera anicteric Neck supple trachea midline Lungs clear to auscultation bilaterally normal work of breathing at rest  Heart S1S2 no rub Abdomen soft nontender nondistended Extremities no edema  Psych normal mood and affect Neuro alert and oriented x 3 provides hx and follows commands GU no foley   Medications reviewed   Labs:  BMP Latest Ref Rng & Units 09/11/2020 09/10/2020 11/09/2012  Glucose 70 - 99 mg/dL 259(H) 302(H) 79  BUN 8 - 23 mg/dL 91(H) 102(H) 31(H)  Creatinine 0.61 - 1.24 mg/dL 4.23(H) 4.47(H) 1.39(H)  BUN/Creat Ratio 9 - 20 - - 22(H)  Sodium 135 - 145 mmol/L 133(L) 132(L) 144  Potassium 3.5 - 5.1 mmol/L 3.3(L) 4.3 5.1  Chloride 98 - 111 mmol/L 101 96(L)  103  CO2 22 - 32 mmol/L 21(L) 23 26  Calcium 8.9 - 10.3 mg/dL 8.3(L) 8.9 9.6     Assessment/Plan:   # AKI on CKD 3: pt with baseline Cr 1.5-1.'7mg'$ /dL presumed HTN/DM related now with uptrend really 1st noted in 05/2020 to 3.5 and now 4.7.  By history no clear etiology for the AKI.  note uncontrolled DM.  He doesn't appear hypovolemic.  UA with 100 mg/dL proteinuria and 0-5 RBC; up/cr ratio 3630 mg/g in setting of uncontrolled DM.  He would want HD if needed.  Renal US with no hydro and with diffusely increased cortical echogenicity  - slightly improved with supportive care  - Increase NS to 100 ml/hr -  r/o myeloma - SPEP and free light chains pending - Hold ARB, valtrex and SGLT2i for now    - check post-void residual bladder scan and place foley if over 250 ml urine retained - potassium 20  meq once   # HTN:  control improved on current regimen .  Hold home losartan 100  # proteinuria  - Feel secondary to uncontrolled DM  - Defer bx for now and monitor with supportive care - note plasma cell dyscrasia w/u in process - SPEP and free light chains pending  # DM:  had generally been well controlled but check 08/2020 with A1c 14+.  He's been a lot more sedentary at his new job - says adherent to meds as he always had been.  Med titration per primary.   # Anemia, normocytic: iron deficiency. Start PO iron. R/o myeloma.    # HL: ok to cont statin for now, CK ok to make sure by no c/o myalgias.   Note he is appropriate for inpatient monitoring. Obs is listed   Claudia Desanctis, MD 09/11/2020 3:43 PM

## 2020-09-11 NOTE — Progress Notes (Signed)
Patient transferred from the ED to 5W22. Patient is alert and oriented to person, place, time, and situation. Telemetry monitoring enabled, vital signs taken, and IV assessed for patency. Skin checked with Jinny Blossom RN. Fall precautions initiated. Patient bed in the locked, lowest position. Non-slip socks in place and bed alarm on. Call bell is within reach. Patient knows to call for assistance prior to getting up and patient demonstrates use. Patient is laying comfortably in bed.

## 2020-09-11 NOTE — ED Notes (Signed)
Pt BP low paged provider.

## 2020-09-11 NOTE — Progress Notes (Signed)
Received a call from bedside RN regarding the patient being hypotensive with MAP in the low 50s.  Ordered 1 L of IV normal saline bolus.  BP improved with IV fluid bolus.  He had previously received a bolus of 1 L IV fluid lactated ringer on 09/10/2020.    Reviewed the patient's chart.  He is on Coreg 25 mg twice daily, clonidine 0.1 mg twice daily, HCTZ 25 mg daily, losartan 100 mg daily, and Lasix 80 mg daily.  Reduced dose of Coreg to 3.125 mg twice daily to avoid beta-blocker withdrawal.  Continued clonidine to avoid rebound hypertension.  Held off rest of home oral antihypertensives to avoid recurrent hypotension.  Currently BP 104/49, MAP 65.    We will continue to closely monitor and treat as indicated.  Added gentle IV fluid maintenance normal saline at 50 cc/h x 1 day.

## 2020-09-11 NOTE — Progress Notes (Signed)
Inpatient Diabetes Program Recommendations  AACE/ADA: New Consensus Statement on Inpatient Glycemic Control (2015)  Target Ranges:  Prepandial:   less than 140 mg/dL      Peak postprandial:   less than 180 mg/dL (1-2 hours)      Critically ill patients:  140 - 180 mg/dL   Lab Results  Component Value Date   GLUCAP 315 (H) 09/11/2020   HGBA1C 13.4 (H) 09/10/2020    Review of Glycemic Control Results for KABIR, BORELL (MRN [id number]) as of 09/11/2020 15:01  Ref. Range 09/11/2020 08:09 09/11/2020 13:39  Glucose-Capillary Latest Ref Range: 70 - 99 mg/dL 111 (H) 315 (H)   Diabetes history: DM2 Outpatient Diabetes medications: Levemir 80 units QD, Farxiga 10 mg QD, Trulicity 1.5 weekly Current orders for Inpatient glycemic control: Levemir 50 QD, Novolog 0-9 units TID and HS  Inpatient Diabetes Program Recommendations:    Please consider adding meal coverage:  Novolog 4 units TID with meals if eats at least 50%  Spoke with patient and wife at bedside.  Reviewed patient's current A1c of 13.4%. Explained what a A1c is and what it measures. Also reviewed goal A1c with patient, importance of good glucose control @ home, and blood sugar goals. He states his last A1C was around 8% 3 months ago.  He is current with his PCP.  He confirms above home medications.  He checks his blood sugar every morning.  Asked him to start checking TID before meals.  He would likely benefit from meal coverage at discharge.  He has Humalog at home.  Discussed CHO's and The Plate method.  He does drink sweet tea.  Encouraged him to eliminate caloric beverages.  Ordered LWWD booklet.  Denies difficulties obtaining medications.     Will continue to follow while inpatient.  Thank you, Reche Dixon, RN, BSN Diabetes Coordinator Inpatient Diabetes Program [phone] (team pager from 8a-5p)

## 2020-09-11 NOTE — ED Notes (Signed)
Provider stated to give bolus of normal saline.

## 2020-09-12 LAB — CBC WITH DIFFERENTIAL/PLATELET
Abs Immature Granulocytes: 0.09 10*3/uL — ABNORMAL HIGH (ref 0.00–0.07)
Basophils Absolute: 0 10*3/uL (ref 0.0–0.1)
Basophils Relative: 0 %
Eosinophils Absolute: 0.3 10*3/uL (ref 0.0–0.5)
Eosinophils Relative: 4 %
HCT: 25.2 % — ABNORMAL LOW (ref 39.0–52.0)
Hemoglobin: 8.4 g/dL — ABNORMAL LOW (ref 13.0–17.0)
Immature Granulocytes: 1 %
Lymphocytes Relative: 18 %
Lymphs Abs: 1.5 10*3/uL (ref 0.7–4.0)
MCH: 30.7 pg (ref 26.0–34.0)
MCHC: 33.3 g/dL (ref 30.0–36.0)
MCV: 92 fL (ref 80.0–100.0)
Monocytes Absolute: 0.8 10*3/uL (ref 0.1–1.0)
Monocytes Relative: 9 %
Neutro Abs: 5.9 10*3/uL (ref 1.7–7.7)
Neutrophils Relative %: 68 %
Platelets: 240 10*3/uL (ref 150–400)
RBC: 2.74 MIL/uL — ABNORMAL LOW (ref 4.22–5.81)
RDW: 12.9 % (ref 11.5–15.5)
WBC: 8.6 10*3/uL (ref 4.0–10.5)
nRBC: 0 % (ref 0.0–0.2)

## 2020-09-12 LAB — BASIC METABOLIC PANEL
Anion gap: 12 (ref 5–15)
BUN: 79 mg/dL — ABNORMAL HIGH (ref 8–23)
CO2: 24 mmol/L (ref 22–32)
Calcium: 8.6 mg/dL — ABNORMAL LOW (ref 8.9–10.3)
Chloride: 102 mmol/L (ref 98–111)
Creatinine, Ser: 3.84 mg/dL — ABNORMAL HIGH (ref 0.61–1.24)
GFR, Estimated: 16 mL/min — ABNORMAL LOW (ref >60)
Glucose, Bld: 202 mg/dL — ABNORMAL HIGH (ref 70–99)
Potassium: 3.7 mmol/L (ref 3.5–5.1)
Sodium: 138 mmol/L (ref 135–145)

## 2020-09-12 LAB — GLUCOSE, CAPILLARY
Glucose-Capillary: 155 mg/dL — ABNORMAL HIGH (ref 70–99)
Glucose-Capillary: 108 mg/dL — ABNORMAL HIGH (ref 70–99)
Glucose-Capillary: 173 mg/dL — ABNORMAL HIGH (ref 70–99)
Glucose-Capillary: 248 mg/dL — ABNORMAL HIGH (ref 70–99)

## 2020-09-12 MED ORDER — AMLODIPINE BESYLATE 5 MG PO TABS: 5.0000 mg | ORAL_TABLET | Freq: Every day | ORAL | Status: DC

## 2020-09-12 NOTE — Plan of Care (Signed)
  RD consulted for nutrition education regarding diabetes.   Lab Results  Component Value Date   HGBA1C 13.4 (H) 09/10/2020    RD provided "Carbohydrate Counting for People with Diabetes" handout from the Academy of Nutrition and Dietetics and placed it in discharge instructions. Pt reports having previous knowledge on diet regarding diabetes, however reports has not been following it over the past 2 years since start of pandemic. Pt reports eating out at fast foods/restaurants frequently. Pt motivated to follow diabetes diet and monitor portion sizes. Discussed different food groups and their effects on blood sugar, emphasizing carbohydrate-containing foods. Provided list of carbohydrates and recommended serving sizes of common foods. Discussed diabetic friendly drink options. Teach back method used.  Expect good compliance.  Current diet order is renal/carb mod with 1200 ml fluid restriction, patient is consuming approximately 100% of meals at this time. Labs and medications reviewed. No further nutrition interventions warranted at this time. RD contact information provided. If additional nutrition issues arise, please re-consult RD.  Corrin Parker, MS, RD, LDN RD pager number/after hours weekend pager number on Amion.

## 2020-09-12 NOTE — Discharge Instructions (Signed)
Carbohydrate Counting For People With Diabetes  Foods with carbohydrates make your blood glucose level go up. Learning how to count carbohydrates can help you control your blood glucose levels. First, identify the foods you eat that contain carbohydrates. Then, using the Foods with Carbohydrates chart, determine about how much carbohydrates are in your meals and snacks. Make sure you are eating foods with fiber, protein, and healthy fat along with your carbohydrate foods. Foods with Carbohydrates The following table shows carbohydrate foods that have about 15 grams of carbohydrate each. Using measuring cups, spoons, or a food scale when you first begin learning about carbohydrate counting can help you learn about the portion sizes you typically eat. The following foods have 15 grams carbohydrate each:  Grains 1 slice bread (1 ounce)  1 small tortilla (6-inch size)   large bagel (1 ounce)  1/3 cup pasta or rice (cooked)   hamburger or hot dog bun ( ounce)   cup cooked cereal   to  cup ready-to-eat cereal  2 taco shells (5-inch size) Fruit 1 small fresh fruit ( to 1 cup)   medium banana  17 small grapes (3 ounces)  1 cup melon or berries   cup canned or frozen fruit  2 tablespoons dried fruit (blueberries, cherries, cranberries, raisins)   cup unsweetened fruit juice  Starchy Vegetables  cup cooked beans, peas, corn, potatoes/sweet potatoes   large baked potato (3 ounces)  1 cup acorn or butternut squash  Snack Foods 3 to 6 crackers  8 potato chips or 13 tortilla chips ( ounce to 1 ounce)  3 cups popped popcorn  Dairy 3/4 cup (6 ounces) nonfat plain yogurt, or yogurt with sugar-free sweetener  1 cup milk  1 cup plain rice, soy, coconut or flavored almond milk Sweets and Desserts  cup ice cream or frozen yogurt  1 tablespoon jam, jelly, pancake syrup, table sugar, or honey  2 tablespoons light pancake syrup  1 inch square of frosted cake or 2 inch square of unfrosted  cake  2 small cookies (2/3 ounce each) or  large cookie  Sometimes you'll have to estimate carbohydrate amounts if you don't know the exact recipe. One cup of mixed foods like soups can have 1 to 2 carbohydrate servings, while some casseroles might have 2 or more servings of carbohydrate. Foods that have less than 20 calories in each serving can be counted as "free" foods. Count 1 cup raw vegetables, or  cup cooked non-starchy vegetables as "free" foods. If you eat 3 or more servings at one meal, then count them as 1 carbohydrate serving.  Foods without Carbohydrates  Not all foods contain carbohydrates. Meat, some dairy, fats, non-starchy vegetables, and many beverages don't contain carbohydrate. So when you count carbohydrates, you can generally exclude chicken, pork, beef, fish, seafood, eggs, tofu, cheese, butter, sour cream, avocado, nuts, seeds, olives, mayonnaise, water, black coffee, unsweetened tea, and zero-calorie drinks. Vegetables with no or low carbohydrate include green beans, cauliflower, tomatoes, and onions. How much carbohydrate should I eat at each meal?  Carbohydrate counting can help you plan your meals and manage your weight. Following are some starting points for carbohydrate intake at each meal. Work with your registered dietitian nutritionist to find the best range that works for your blood glucose and weight.   To Lose Weight To Maintain Weight  Women 2 - 3 carb servings 3 - 4 carb servings  Men 3 - 4 carb servings 4 - 5 carb servings  Checking your   blood glucose after meals will help you know if you need to adjust the timing, type, or number of carbohydrate servings in your meal plan. Achieve and keep a healthy body weight by balancing your food intake and physical activity.  Tips How should I plan my meals?  Plan for half the food on your plate to include non-starchy vegetables, like salad greens, broccoli, or carrots. Try to eat 3 to 5 servings of non-starchy vegetables  every day. Have a protein food at each meal. Protein foods include chicken, fish, meat, eggs, or beans (note that beans contain carbohydrate). These two food groups (non-starchy vegetables and proteins) are low in carbohydrate. If you fill up your plate with these foods, you will eat less carbohydrate but still fill up your stomach. Try to limit your carbohydrate portion to  of the plate.  What fats are healthiest to eat?  Diabetes increases risk for heart disease. To help protect your heart, eat more healthy fats, such as olive oil, nuts, and avocado. Eat less saturated fats like butter, cream, and high-fat meats, like bacon and sausage. Avoid trans fats, which are in all foods that list "partially hydrogenated oil" as an ingredient. What should I drink?  Choose drinks that are not sweetened with sugar. The healthiest choices are water, carbonated or seltzer waters, and tea and coffee without added sugars.  Sweet drinks will make your blood glucose go up very quickly. One serving of soda or energy drink is  cup. It is best to drink these beverages only if your blood glucose is low.  Artificially sweetened, or diet drinks, typically do not increase your blood glucose if they have zero calories in them. Read labels of beverages, as some diet drinks do have carbohydrate and will raise your blood glucose. Label Reading Tips Read Nutrition Facts labels to find out how many grams of carbohydrate are in a food you want to eat. Don't forget: sometimes serving sizes on the label aren't the same as how much food you are going to eat, so you may need to calculate how much carbohydrate is in the food you are serving yourself.   Carbohydrate Counting for People with Diabetes Sample 1-Day Menu  Breakfast  cup yogurt, low fat, low sugar (1 carbohydrate serving)   cup cereal, ready-to-eat, unsweetened (1 carbohydrate serving)  1 cup strawberries (1 carbohydrate serving)   cup almonds ( carbohydrate serving)   Lunch 1, 5 ounce can chunk light tuna  2 ounces cheese, low fat cheddar  6 whole wheat crackers (1 carbohydrate serving)  1 small apple (1 carbohydrate servings)   cup carrots ( carbohydrate serving)   cup snap peas  1 cup 1% milk (1 carbohydrate serving)   Evening Meal Stir fry made with: 3 ounces chicken  1 cup brown rice (3 carbohydrate servings)   cup broccoli ( carbohydrate serving)   cup green beans   cup onions  1 tablespoon olive oil  2 tablespoons teriyaki sauce ( carbohydrate serving)  Evening Snack 1 extra small banana (1 carbohydrate serving)  1 tablespoon peanut butter   Carbohydrate Counting for People with Diabetes Vegan Sample 1-Day Menu  Breakfast 1 cup cooked oatmeal (2 carbohydrate servings)   cup blueberries (1 carbohydrate serving)  2 tablespoons flaxseeds  1 cup soymilk fortified with calcium and vitamin D  1 cup coffee  Lunch 2 slices whole wheat bread (2 carbohydrate servings)   cup baked tofu   cup lettuce  2 slices tomato  2 slices avocado     cup baby carrots ( carbohydrate serving)  1 orange (1 carbohydrate serving)  1 cup soymilk fortified with calcium and vitamin D   Evening Meal Burrito made with: 1 6-inch corn tortilla (1 carbohydrate serving)  1 cup refried vegetarian beans (2 carbohydrate servings)   cup chopped tomatoes   cup lettuce   cup salsa  1/3 cup brown rice (1 carbohydrate serving)  1 tablespoon olive oil for rice   cup zucchini   Evening Snack 6 small whole grain crackers (1 carbohydrate serving)  2 apricots ( carbohydrate serving)   cup unsalted peanuts ( carbohydrate serving)    Carbohydrate Counting for People with Diabetes Vegetarian (Lacto-Ovo) Sample 1-Day Menu  Breakfast 1 cup cooked oatmeal (2 carbohydrate servings)   cup blueberries (1 carbohydrate serving)  2 tablespoons flaxseeds  1 egg  1 cup 1% milk (1 carbohydrate serving)  1 cup coffee  Lunch 2 slices whole wheat bread (2 carbohydrate  servings)  2 ounces low-fat cheese   cup lettuce  2 slices tomato  2 slices avocado   cup baby carrots ( carbohydrate serving)  1 orange (1 carbohydrate serving)  1 cup unsweetened tea  Evening Meal Burrito made with: 1 6-inch corn tortilla (1 carbohydrate serving)   cup refried vegetarian beans (1 carbohydrate serving)   cup tomatoes   cup lettuce   cup salsa  1/3 cup brown rice (1 carbohydrate serving)  1 tablespoon olive oil for rice   cup zucchini  1 cup 1% milk (1 carbohydrate serving)  Evening Snack 6 small whole grain crackers (1 carbohydrate serving)  2 apricots ( carbohydrate serving)   cup unsalted peanuts ( carbohydrate serving)    Copyright 2020  Academy of Nutrition and Dietetics. All rights reserved.  Using Nutrition Labels: Carbohydrate  Serving Size  Look at the serving size. All the information on the label is based on this portion. Servings Per Container  The number of servings contained in the package. Guidelines for Carbohydrate  Look at the total grams of carbohydrate in the serving size.  1 carbohydrate choice = 15 grams of carbohydrate. Range of Carbohydrate Grams Per Choice  Carbohydrate Grams/Choice Carbohydrate Choices  6-10   11-20 1  21-25 1  26-35 2  36-40 2  41-50 3  51-55 3  56-65 4  66-70 4  71-80 5    Copyright 2020  Academy of Nutrition and Dietetics. All rights reserved.  Corrin Parker, MS, RD, LDN Clinical Dietitian Office phone [phone]

## 2020-09-12 NOTE — Progress Notes (Signed)
[facility] Progress Note  Name: Timothy Holloway MRN: [id number] DOB: 06-11-1955   Subjective:   had 4.5 liters UOP over 8/12. Bladder scan negligible.  States that he can't take amlodipine due to feet swelling.  Home meds are carvedilol (not sure of dose but insurance wouldn't pay for one formulation of coreg), clonidine 0.1 mg BID, HCTZ 25 mg daily, lasix 80 mg daily.  He has been on concurrent lasix and HCTZ since May.  He is on losartan 100 mg daily; he used to take valsartan 320 mg daily but this was a "long time ago"  Review of systems:  Denies n/v Denies chest pain or shortness of breath  --------------  Timothy Holloway is an 65 y.o. male with DM (no DR or DPN), HTN, HL, obesity, DJD, CKD 3 who presented to the ED today for abnormal labs and nephrology is consulted for evaluation and management of AKI on CKD.  Creatinine baseline noted to be 1.3-1.'7mg'$ /dL per records.  In 10/2019 was 1.8 > 05/2020 Cr 3.5 and 09/09/2020 4.7 with PCP who dispatched him to ED for eval. Associated 09/09/2020 labs K 5, Bicarb 21, T prot 6.5, Alb 3.9, LFTs normal, Hb 9.1 from 9.7 in 03/2020, MCV 89, PSA 0.6.  A1c 05/2020 8.8 and 08/2020 14.3.  He is seen with his wife in the ED.  He's been in his usual state of health lately but has taken a new job this year in which he's very sedentary.  He had been quite active at work previously and losing weight but this has really stagnated.  He has no recently new symptoms or acute illnesses.  Denies new LUTs and really has only chronic nocturia q1.5-2hrs.  Has ankle edema only today; not preceding ED.  No hematuria, tea or cola colored urine, no foamy/frothy urine, no flank pain, no f/c/night sweats.  No eye, ear, oral issues, no GI issues or diarrhea.  Home BPs 140s - in the ED he's 190s after missed meds and anxiety. Doesn't feel dehydrated and no NSAID use.  No recent contrast exposure.  Pt follows with Dr. Moshe Cipro, last visit 10/01/19.  At that time his  renal function on 08/3019 check was stable at 1.56.  UACR ~1100 in 10/2017.    Intake/Output Summary (Last 24 hours) at 09/12/2020 1243 Last data filed at 09/12/2020 0850 Gross per 24 hour  Intake 2907.78 ml  Output 4250 ml  Net -1342.22 ml    Vitals:  Vitals:   09/12/20 0352 09/12/20 0400 09/12/20 0804 09/12/20 1212  BP: (!) 144/51  (!) 141/79 (!) 165/76  Pulse: (!) 57  60 61  Resp: '17  18 20  '$ Temp: 98.2 F (36.8 C)  98.8 F (37.1 C) 98.7 F (37.1 C)  TempSrc: Oral  Oral Oral  SpO2: 96%  100% 100%  Weight:  130.6 kg    Height:         Physical Exam:   General adult male in bed in no acute distress HEENT normocephalic atraumatic extraocular movements intact sclera anicteric Neck supple trachea midline Lungs clear to auscultation bilaterally normal work of breathing at rest  Heart S1S2 no rub Abdomen soft nontender nondistended Extremities trace edema  Psych normal mood and affect Neuro alert and oriented x 3 provides hx and follows commands GU no foley   Medications reviewed   Labs:  BMP Latest Ref Rng & Units 09/12/2020 09/11/2020 09/10/2020  Glucose 70 - 99 mg/dL 202(H) 259(H) 302(H)  BUN 8 - 23  mg/dL 79(H) 91(H) 102(H)  Creatinine 0.61 - 1.24 mg/dL 3.84(H) 4.23(H) 4.47(H)  BUN/Creat Ratio 9 - 20 - - -  Sodium 135 - 145 mmol/L 138 133(L) 132(L)  Potassium 3.5 - 5.1 mmol/L 3.7 3.3(L) 4.3  Chloride 98 - 111 mmol/L 102 101 96(L)  CO2 22 - 32 mmol/L 24 21(L) 23  Calcium 8.9 - 10.3 mg/dL 8.6(L) 8.3(L) 8.9     Assessment/Plan:   # AKI on CKD 3: pt with baseline Cr 1.5-1.'7mg'$ /dL presumed HTN/DM related now with uptrend in creatinine which was first noted in 05/2020 to 3.5 and now 4.7.  pre-renal insults with two diuretics (both lasix and HCTZ) and uncontrolled DM.  He doesn't appear hypovolemic.  UA with 100 mg/dL proteinuria and 0-5 RBC; up/cr ratio 3630 mg/g in setting of uncontrolled DM.  He would want HD if needed.  Renal US with no hydro and with diffusely increased  cortical echogenicity.  - slightly improved with supportive care  - Will stop NS at 100 ml/hr after current bag done infusing - hold lasix for now and will not restart HCTZ (would plan to discharge only on the lasix for diuretic) -  r/o myeloma - SPEP and free light chains pending   - Hold ARB, valtrex and farxiga for now      # HTN:  Hold home ARB. Not on reported home dose of beta blocker but bradycardia.  Stopping IV fluids  # proteinuria  - Feel secondary to uncontrolled DM  - Defer bx for now and monitor with supportive care - note plasma cell dyscrasia w/u in process - SPEP and free light chains pending  # DM:  had generally been well controlled but on check 08/2020 with A1c 14+.  He's been a lot more sedentary at his new job - says adherent to meds as he always had been.  Med titration per primary.   # Anemia, normocytic: iron deficiency. Started PO iron. R/o myeloma.    # HL: ok to cont statin for now, CK ok    Disposition continue inpatient monitoring and hopeful for possible discharge tomorrow from a renal standpoint.    Claudia Desanctis, MD 09/12/2020 1:23 PM

## 2020-09-12 NOTE — Progress Notes (Signed)
Patient ID: Timothy Holloway, male   DOB: [birth year], 65 y.o.   MRN: [id number]  PROGRESS NOTE    Timothy Holloway  [id number] DOB: Dec 19, 1955 DOA: 09/10/2020 PCP: Patient, No Pcp Per (Inactive)   Brief Narrative:  65 y.o. male with medical history significant of hyperlipidemia, CKD, GERD, diverticulosis, OSA, diabetes was sent in by outpatient provider for abnormal lab values.  On presentation, creatinine was 4.47, up from baseline of around 1.5; sodium 132.  Nephrology was consulted. He was started on IV fluids.  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  Presented with creatinine of 4.47. -Renal ultrasound negative for hydronephrosis -Nephrology following.  Continue IV fluids.  Creatinine improving to 3.84 today.  Follow nephrology recommendations.  Hold nephrotoxic medications including home losartan, Valtrex. -Strict input and output.  Daily weights.  Diabetes mellitus type 2 with hyperglycemia -A1c 13.4.  Continue Levemir along with CBGs with SSI  Hypertension -Continue Coreg and clonidine.  ARB on hold.  Blood pressure intermittently on the higher side.  Insomnia/anxiety -Continue Lexapro  Hyperlipidemia -Continue atorvastatin  Obesity -Outpatient follow-up  Anemia of chronic disease -From renal failure.  Hemoglobin stable.  No signs of bleeding.  Monitor  Hyponatremia -Improved.  Hypokalemia -Improved  DVT prophylaxis: Lovenox Code Status: Full Family Communication: None at bedside Disposition Plan: Status is: Inpatient because: Inpatient level of care appropriate due to severity of illness  Dispo: The patient is from: Home              Anticipated d/c is to: Home              Patient currently is not medically stable to d/c.   Difficult to place patient No  Consultants: Nephrology  Procedures: None  Antimicrobials: None   Subjective: Patient seen and examined at bedside.  No overnight fever,  vomiting, worsening shortness of breath.  He is making good amount of urine. Objective: Vitals:   09/11/20 2354 09/12/20 0352 09/12/20 0400 09/12/20 0804  BP: (!) 106/51 (!) 144/51  (!) 141/79  Pulse: 61 (!) 57  60  Resp: '17 17  18  '$ Temp: (!) 97.5 F (36.4 C) 98.2 F (36.8 C)  98.8 F (37.1 C)  TempSrc: Oral Oral  Oral  SpO2: 98% 96%  100%  Weight:   130.6 kg   Height:        Intake/Output Summary (Last 24 hours) at 09/12/2020 [id number] Last data filed at 09/12/2020 0720 Gross per 24 hour  Intake 2907.78 ml  Output 4450 ml  Net -1542.22 ml    Filed Weights   09/10/20 1139 09/11/20 0522 09/12/20 0400  Weight: 134.3 kg 132.6 kg 130.6 kg    Examination:  General exam: No distress.  On room air currently. Respiratory system: Decreased breath sounds at bases bilaterally cardiovascular system: Intermittent bradycardia; S1-S2 heard gastrointestinal system: Abdomen is obese, distended slightly, soft and nontender.  Bowel sounds are heard  extremities: Mild lower extremity edema present bilaterally; no cyanosis  Central nervous system: Awake and alert.  No focal neurological deficits.  Moves extremities  skin: No obvious ecchymosis/rashes Psychiatry: Mood, affect and judgment are normal   Data Reviewed: I have personally reviewed following labs and imaging studies  CBC: Recent Labs  Lab 09/10/20 1146 09/11/20 0210 09/12/20 0033  WBC 8.6 9.2 8.6  NEUTROABS 5.9  --  5.9  HGB 9.5* 8.3* 8.4*  HCT 28.5* 26.0* 25.2*  MCV 91.6 93.9 92.0  PLT 263  223 [id number]    Basic Metabolic Panel: Recent Labs  Lab 09/10/20 1146 09/10/20 1929 09/11/20 0210 09/12/20 0033  NA 132*  --  133* 138  K 4.3  --  3.3* 3.7  CL 96*  --  101 102  CO2 23  --  21* 24  GLUCOSE 302*  --  259* 202*  BUN 102*  --  91* 79*  CREATININE 4.47*  --  4.23* 3.84*  CALCIUM 8.9  --  8.3* 8.6*  MG  --  2.5*  --   --   PHOS  --  5.9* 5.1*  --     GFR: Estimated Creatinine Clearance: 26.1 mL/min (A) (by C-G  formula based on SCr of 3.84 mg/dL (H)). Liver Function Tests: Recent Labs  Lab 09/10/20 1146 09/11/20 0210  AST 15  --   ALT 14  --   ALKPHOS 76  --   BILITOT 0.4  --   PROT 7.2  --   ALBUMIN 3.4* 2.8*    No results for input(s): LIPASE, AMYLASE in the last 168 hours. No results for input(s): AMMONIA in the last 168 hours. Coagulation Profile: No results for input(s): INR, PROTIME in the last 168 hours. Cardiac Enzymes: Recent Labs  Lab 09/10/20 1929  CKTOTAL 78    BNP (last 3 results) No results for input(s): PROBNP in the last 8760 hours. HbA1C: Recent Labs    09/10/20 2327  HGBA1C 13.4*    CBG: Recent Labs  Lab 09/11/20 0809 09/11/20 1339 09/11/20 1649 09/11/20 2043 09/12/20 0805  GLUCAP 111* 315* 310* 273* 155*    Lipid Profile: No results for input(s): CHOL, HDL, LDLCALC, TRIG, CHOLHDL, LDLDIRECT in the last 72 hours. Thyroid Function Tests: No results for input(s): TSH, T4TOTAL, FREET4, T3FREE, THYROIDAB in the last 72 hours. Anemia Panel: Recent Labs    09/11/20 0210  VITAMINB12 757  FOLATE 25.8  FERRITIN 846*  TIBC 353  IRON 51    Sepsis Labs: No results for input(s): PROCALCITON, LATICACIDVEN in the last 168 hours.  Recent Results (from the past 240 hour(s))  SARS CORONAVIRUS 2 (TAT 6-24 HRS) Nasopharyngeal Nasopharyngeal Swab     Status: None   Collection Time: 09/11/20  4:04 AM   Specimen: Nasopharyngeal Swab  Result Value Ref Range Status   SARS Coronavirus 2 NEGATIVE NEGATIVE Final    Comment: (NOTE) SARS-CoV-2 target nucleic acids are NOT DETECTED.  The SARS-CoV-2 RNA is generally detectable in upper and lower respiratory specimens during the acute phase of infection. Negative results do not preclude SARS-CoV-2 infection, do not rule out co-infections with other pathogens, and should not be used as the sole basis for treatment or other patient management decisions. Negative results must be combined with clinical  observations, patient history, and epidemiological information. The expected result is Negative.  Fact Sheet for Patients: SugarRoll.be  Fact Sheet for Healthcare Providers: https://www.woods-mathews.com/  This test is not yet approved or cleared by the [place] FDA and  has been authorized for detection and/or diagnosis of SARS-CoV-2 by FDA under an Emergency Use Authorization (EUA). This EUA will remain  in effect (meaning this test can be used) for the duration of the COVID-19 declaration under Se ction 564(b)(1) of the Act, 21 U.S.C. section 360bbb-3(b)(1), unless the authorization is terminated or revoked sooner.  Performed at [facility], [place] [address] [place], [place] [address]          Radiology Studies: US Renal  Result Date: 09/10/2020 CLINICAL DATA:  Acute kidney injury EXAM: RENAL / URINARY TRACT ULTRASOUND COMPLETE COMPARISON:  None. FINDINGS: Right Kidney: Renal measurements: 11.6 x 6.6 x 6.1 cm = volume: 244.5 mL. Diffusely increased renal cortical echogenicity. No concerning renal mass, shadowing calculus or hydronephrosis. Left Kidney: Renal measurements: 13 x 7.1 x 5.8 cm = volume: 276.5 mL. Diffusely increased renal cortical echogenicity. Several small echogenic, shadowing foci are present in both kidneys, measuring up to 1.8 cm in size. Poorly visualized on sagittal imaging. Could reflect collecting system nephroliths versus vascular calcification or combination there of. No hydronephrosis. No concerning renal mass. Bladder: Appears normal for degree of bladder distention. Other: None. IMPRESSION: 1. Few echogenic, shadowing foci present in the left kidney may reflect renal calculi or vascular calcium. No evidence of hydronephrosis. 2. Diffusely increased renal cortical echogenicity, nonspecific though can be seen with medical renal disease. Electronically Signed   By: Lovena Le M.D.   On: 09/10/2020 19:34         Scheduled Meds:  atorvastatin  10 mg Oral Daily   carvedilol  3.125 mg Oral BID WC   cloNIDine  0.1 mg Oral BID   enoxaparin (LOVENOX) injection  30 mg Subcutaneous Q24H   escitalopram  10 mg Oral Daily   ferrous sulfate  325 mg Oral Q breakfast   insulin aspart  0-9 Units Subcutaneous TID WC   insulin aspart  4 Units Subcutaneous TID WC   insulin detemir  50 Units Subcutaneous QHS   sodium chloride flush  3 mL Intravenous Q12H   Continuous Infusions:  sodium chloride 100 mL/hr at 09/12/20 0645          Timothy Holloway August, MD [facility] 09/12/2020, 8:22 AM

## 2020-09-13 DIAGNOSIS — N184 Chronic kidney disease, stage 4 (severe): Secondary | ICD-10-CM

## 2020-09-13 LAB — BASIC METABOLIC PANEL
Anion gap: 8 (ref 5–15)
BUN: 65 mg/dL — ABNORMAL HIGH (ref 8–23)
CO2: 24 mmol/L (ref 22–32)
Calcium: 8.6 mg/dL — ABNORMAL LOW (ref 8.9–10.3)
Chloride: 106 mmol/L (ref 98–111)
Creatinine, Ser: 3.32 mg/dL — ABNORMAL HIGH (ref 0.61–1.24)
GFR, Estimated: 20 mL/min — ABNORMAL LOW (ref >60)
Glucose, Bld: 147 mg/dL — ABNORMAL HIGH (ref 70–99)
Potassium: 3.8 mmol/L (ref 3.5–5.1)
Sodium: 138 mmol/L (ref 135–145)

## 2020-09-13 LAB — GLUCOSE, CAPILLARY
Glucose-Capillary: 88 mg/dL (ref 70–99)
Glucose-Capillary: 136 mg/dL — ABNORMAL HIGH (ref 70–99)

## 2020-09-13 MED ORDER — HYDRALAZINE HCL 25 MG PO TABS: 25.0000 mg | ORAL_TABLET | Freq: Three times a day (TID) | ORAL | Status: DC

## 2020-09-13 MED ORDER — HYDRALAZINE HCL 25 MG PO TABS: 25.0000 mg | ORAL_TABLET | Freq: Three times a day (TID) | ORAL | 0 refills | Status: DC

## 2020-09-13 MED ORDER — LEVEMIR FLEXTOUCH 100 UNIT/ML ~~LOC~~ SOPN: 50.0000 [IU] | PEN_INJECTOR | Freq: Every day | SUBCUTANEOUS | 11 refills | Status: DC

## 2020-09-13 MED ORDER — FUROSEMIDE 80 MG PO TABS: 80.0000 mg | ORAL_TABLET | Freq: Every day | ORAL | Status: DC | PRN

## 2020-09-13 MED ORDER — CARVEDILOL 3.125 MG PO TABS: 3.1250 mg | ORAL_TABLET | Freq: Two times a day (BID) | ORAL | 0 refills | Status: DC

## 2020-09-13 NOTE — Discharge Summary (Signed)
Physician Discharge Summary  Timothy Holloway [id number] DOB: 03-26-1955 DOA: 09/10/2020  PCP: Patient, No Pcp Per (Inactive)  Admit date: 09/10/2020 Discharge date: 09/13/2020  Admitted From: Home Disposition: Home  Recommendations for Outpatient Follow-up:  Follow up with PCP in 1 week with repeat CBC/BMP Outpatient follow-up with nephrology Follow up in ED if symptoms worsen or new appear   Home Health: No Equipment/Devices: None  Discharge Condition: Stable CODE STATUS: Full Diet recommendation: Heart healthy/carb modified diet  Brief/Interim Summary: 65 y.o. male with medical history significant of hyperlipidemia, CKD, GERD, diverticulosis, OSA, diabetes was sent in by outpatient provider for abnormal lab values.  On presentation, creatinine was 4.47, up from baseline of around 1.5; sodium 132.  Nephrology was consulted. He was started on IV fluids.  During the hospitalizing, his kidney function has gradually improved.  He is hemodynamically stable and tolerating diet.  Nephrology has cleared the patient for discharge today.  Discharge patient home today with outpatient follow-up with PCP and nephrology.  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  Presented with creatinine of 4.47. -Renal ultrasound negative for hydronephrosis -Nephrology following.  Treated with IV fluids.  Creatinine improving to 3.32 today.  Nephrotoxic medications on hold. -Nephrology has cleared the patient for discharge.  Hydrochlorothiazide and Farxiga discontinued as per nephrology recommendations.  No ARB on discharge as well.   -Use Lasix only as needed for shortness of breath/weight gain as per nephrology recommendations. -Outpatient follow-up of BMP within a week.  Outpatient follow-up with nephrology.  Diabetes mellitus type 2 with hyperglycemia -A1c 13.4.  Continue low-dose of long-acting insulin.  Continue Trulicity.  Hold [place].  Carb  modified diet.  Outpatient follow-up with PCP.  Hypertension -Continue Coreg (lower dose used because of bradycardia) and clonidine.  Off ARB and hydrochlorothiazide.  Hydralazine has been started which will be continued.  Insomnia/anxiety -Continue Lexapro   Hyperlipidemia -Continue atorvastatin   Obesity -Outpatient follow-up  Anemia of chronic disease -From renal failure.  Hemoglobin stable.  Outpatient follow-up  Hyponatremia -Improved.  Hypokalemia -Improved     Discharge Instructions  Discharge Instructions     Diet - low sodium heart healthy   Complete by: As directed    Increase activity slowly   Complete by: As directed       Allergies as of 09/13/2020       Reactions   Latex    Lodine [etodolac]    Nausea/vomiting        Medication List     STOP taking these medications    amLODipine 10 MG tablet Commonly known as: NORVASC   carvedilol 25 MG tablet Commonly known as: COREG   carvedilol 80 MG 24 hr capsule Commonly known as: COREG CR Replaced by: carvedilol 3.125 MG tablet   Farxiga 10 MG Tabs tablet Generic drug: dapagliflozin propanediol   hydrochlorothiazide 25 MG tablet Commonly known as: HYDRODIURIL   losartan 100 MG tablet Commonly known as: COZAAR   valsartan 320 MG tablet Commonly known as: DIOVAN       TAKE these medications    acetaminophen 325 MG tablet Commonly known as: TYLENOL Take 650 mg by mouth every 6 (six) hours as needed for moderate pain.   aspirin 81 MG tablet Take 81 mg by mouth daily.   atorvastatin 10 MG tablet Commonly known as: LIPITOR Take 1 tablet (10 mg total) by mouth daily.   carvedilol 3.125 MG tablet Commonly known as: COREG Take 1 tablet (  3.125 mg total) by mouth 2 (two) times daily with a meal. Replaces: carvedilol 80 MG 24 hr capsule   cloNIDine 0.1 MG tablet Commonly known as: CATAPRES Take 0.1 mg by mouth 2 (two) times daily.   co-enzyme Q-10 30 MG capsule Take 30 mg by  mouth daily.   cyclobenzaprine 10 MG tablet Commonly known as: FLEXERIL TAKE ONE TABLET BY MOUTH AT BEDTIME   diclofenac Sodium 1 % Gel Commonly known as: VOLTAREN Apply 2 g topically daily as needed (pain).   diphenhydrAMINE 25 MG tablet Commonly known as: BENADRYL Take 25 mg by mouth every 6 (six) hours as needed for allergies.   EPINEPHrine 0.3 mg/0.3 mL Soaj injection Commonly known as: EPI-PEN Inject 0.3 mg into the muscle once as needed for anaphylaxis.   escitalopram 10 MG tablet Commonly known as: LEXAPRO Take 10 mg by mouth daily.   fenofibrate 145 MG tablet Commonly known as: TRICOR Take 1/2 tablet daily What changed:  how much to take how to take this when to take this additional instructions   ferrous sulfate 325 (65 FE) MG tablet Take 325 mg by mouth 2 (two) times daily with a meal.   furosemide 80 MG tablet Commonly known as: LASIX Take 1 tablet (80 mg total) by mouth daily as needed for fluid or edema (shortness of breath). What changed:  when to take this reasons to take this   Glucosamine Chondr 1500 Complx Caps Take 1 capsule by mouth 2 (two) times daily.   hydrALAZINE 25 MG tablet Commonly known as: APRESOLINE Take 1 tablet (25 mg total) by mouth every 8 (eight) hours.   Levemir FlexTouch 100 UNIT/ML FlexPen Generic drug: insulin detemir Inject 50 Units into the skin daily. What changed: how much to take   meclizine 25 MG tablet Commonly known as: ANTIVERT Take 25 mg by mouth daily as needed for dizziness.   methocarbamol 500 MG tablet Commonly known as: ROBAXIN Take 1 tablet by mouth daily as needed for muscle spasms.   multivitamin tablet Take 1 tablet by mouth daily.   sodium chloride 0.65 % Soln nasal spray Commonly known as: OCEAN Place 1 spray into both nostrils daily as needed for congestion.   Trulicity 1.5 0000000 Sopn Generic drug: Dulaglutide Inject 1.5 mg into the skin once a week. Mondays   Vitamin D 50 MCG (2000  UT) Caps Take 2,000 Units by mouth daily.   zolpidem 10 MG tablet Commonly known as: AMBIEN Take 1 tablet (10 mg total) by mouth at bedtime as needed for sleep.        Follow-up Information     PCP. Schedule an appointment as soon as possible for a visit in 1 week(s).   Why: With repeat BMP        Corliss Parish, MD. Schedule an appointment as soon as possible for a visit in 1 week(s).   Specialty: Nephrology Contact information: [place] [address] [phone]                Allergies  Allergen Reactions   Latex    Lodine [Etodolac]     Nausea/vomiting    Consultations: Nephrology   Procedures/Studies: US Renal  Result Date: 09/10/2020 CLINICAL DATA:  Acute kidney injury EXAM: RENAL / URINARY TRACT ULTRASOUND COMPLETE COMPARISON:  None. FINDINGS: Right Kidney: Renal measurements: 11.6 x 6.6 x 6.1 cm = volume: 244.5 mL. Diffusely increased renal cortical echogenicity. No concerning renal mass, shadowing calculus or hydronephrosis. Left Kidney: Renal measurements:  13 x 7.1 x 5.8 cm = volume: 276.5 mL. Diffusely increased renal cortical echogenicity. Several small echogenic, shadowing foci are present in both kidneys, measuring up to 1.8 cm in size. Poorly visualized on sagittal imaging. Could reflect collecting system nephroliths versus vascular calcification or combination there of. No hydronephrosis. No concerning renal mass. Bladder: Appears normal for degree of bladder distention. Other: None. IMPRESSION: 1. Few echogenic, shadowing foci present in the left kidney may reflect renal calculi or vascular calcium. No evidence of hydronephrosis. 2. Diffusely increased renal cortical echogenicity, nonspecific though can be seen with medical renal disease. Electronically Signed   By: Lovena Le M.D.   On: 09/10/2020 19:34      Subjective: Patient seen and examined at bedside.  He denies worsening shortness of breath, nausea, vomiting.  Feels okay  to go home.  Discharge Exam: Vitals:   09/13/20 0335 09/13/20 0738  BP: (!) 164/82 (!) 170/70  Pulse: 60 (!) 58  Resp: 17 15  Temp: 97.9 F (36.6 C) 98.7 F (37.1 C)  SpO2: 99% 100%    General: Pt is alert, awake, not in acute distress Cardiovascular: Mild intermittent bradycardia present, S1/S2 + Respiratory: bilateral decreased breath sounds at bases Abdominal: Soft, NT, ND, bowel sounds + Extremities: Trace lower extremity edema; no cyanosis    The results of significant diagnostics from this hospitalization (including imaging, microbiology, ancillary and laboratory) are listed below for reference.     Microbiology: Recent Results (from the past 240 hour(s))  SARS CORONAVIRUS 2 (TAT 6-24 HRS) Nasopharyngeal Nasopharyngeal Swab     Status: None   Collection Time: 09/11/20  4:04 AM   Specimen: Nasopharyngeal Swab  Result Value Ref Range Status   SARS Coronavirus 2 NEGATIVE NEGATIVE Final    Comment: (NOTE) SARS-CoV-2 target nucleic acids are NOT DETECTED.  The SARS-CoV-2 RNA is generally detectable in upper and lower respiratory specimens during the acute phase of infection. Negative results do not preclude SARS-CoV-2 infection, do not rule out co-infections with other pathogens, and should not be used as the sole basis for treatment or other patient management decisions. Negative results must be combined with clinical observations, patient history, and epidemiological information. The expected result is Negative.  Fact Sheet for Patients: SugarRoll.be  Fact Sheet for Healthcare Providers: https://www.woods-mathews.com/  This test is not yet approved or cleared by the [place] FDA and  has been authorized for detection and/or diagnosis of SARS-CoV-2 by FDA under an Emergency Use Authorization (EUA). This EUA will remain  in effect (meaning this test can be used) for the duration of the COVID-19 declaration under Se  ction 564(b)(1) of the Act, 21 U.S.C. section 360bbb-3(b)(1), unless the authorization is terminated or revoked sooner.  Performed at [facility], [place] [address], [place], [place] [address]      Labs: BNP (last 3 results) No results for input(s): BNP in the last 8760 hours. Basic Metabolic Panel: Recent Labs  Lab 09/10/20 1146 09/10/20 1929 09/11/20 0210 09/12/20 0033 09/13/20 0047  NA 132*  --  133* 138 138  K 4.3  --  3.3* 3.7 3.8  CL 96*  --  101 102 106  CO2 23  --  21* 24 24  GLUCOSE 302*  --  259* 202* 147*  BUN 102*  --  91* 79* 65*  CREATININE 4.47*  --  4.23* 3.84* 3.32*  CALCIUM 8.9  --  8.3* 8.6* 8.6*  MG  --  2.5*  --   --   --  PHOS  --  5.9* 5.1*  --   --    Liver Function Tests: Recent Labs  Lab 09/10/20 1146 09/11/20 0210  AST 15  --   ALT 14  --   ALKPHOS 76  --   BILITOT 0.4  --   PROT 7.2  --   ALBUMIN 3.4* 2.8*   No results for input(s): LIPASE, AMYLASE in the last 168 hours. No results for input(s): AMMONIA in the last 168 hours. CBC: Recent Labs  Lab 09/10/20 1146 09/11/20 0210 09/12/20 0033  WBC 8.6 9.2 8.6  NEUTROABS 5.9  --  5.9  HGB 9.5* 8.3* 8.4*  HCT 28.5* 26.0* 25.2*  MCV 91.6 93.9 92.0  PLT 263 223 240   Cardiac Enzymes: Recent Labs  Lab 09/10/20 1929  CKTOTAL 78   BNP: Invalid input(s): POCBNP CBG: Recent Labs  Lab 09/12/20 0805 09/12/20 1216 09/12/20 1639 09/12/20 2032 09/13/20 0736  GLUCAP 155* 108* 173* 248* 88   D-Dimer No results for input(s): DDIMER in the last 72 hours. Hgb A1c Recent Labs    09/10/20 2327  HGBA1C 13.4*   Lipid Profile No results for input(s): CHOL, HDL, LDLCALC, TRIG, CHOLHDL, LDLDIRECT in the last 72 hours. Thyroid function studies No results for input(s): TSH, T4TOTAL, T3FREE, THYROIDAB in the last 72 hours.  Invalid input(s): FREET3 Anemia work up Recent Labs    09/11/20 0210  VITAMINB12 757  FOLATE 25.8  FERRITIN 846*  TIBC 353  IRON 51   Urinalysis     Component Value Date/Time   COLORURINE STRAW (A) 09/10/2020 1147   APPEARANCEUR CLEAR 09/10/2020 1147   LABSPEC 1.007 09/10/2020 1147   PHURINE 6.0 09/10/2020 1147   GLUCOSEU >=500 (A) 09/10/2020 1147   HGBUR NEGATIVE 09/10/2020 1147   [place] 09/10/2020 1147   BILIRUBINUR neg 01/10/2013 1032   KETONESUR NEGATIVE 09/10/2020 1147   PROTEINUR 100 (A) 09/10/2020 1147   UROBILINOGEN negative 01/10/2013 1032   NITRITE NEGATIVE 09/10/2020 1147   LEUKOCYTESUR NEGATIVE 09/10/2020 1147   Sepsis Labs Invalid input(s): PROCALCITONIN,  WBC,  LACTICIDVEN Microbiology Recent Results (from the past 240 hour(s))  SARS CORONAVIRUS 2 (TAT 6-24 HRS) Nasopharyngeal Nasopharyngeal Swab     Status: None   Collection Time: 09/11/20  4:04 AM   Specimen: Nasopharyngeal Swab  Result Value Ref Range Status   SARS Coronavirus 2 NEGATIVE NEGATIVE Final    Comment: (NOTE) SARS-CoV-2 target nucleic acids are NOT DETECTED.  The SARS-CoV-2 RNA is generally detectable in upper and lower respiratory specimens during the acute phase of infection. Negative results do not preclude SARS-CoV-2 infection, do not rule out co-infections with other pathogens, and should not be used as the sole basis for treatment or other patient management decisions. Negative results must be combined with clinical observations, patient history, and epidemiological information. The expected result is Negative.  Fact Sheet for Patients: SugarRoll.be  Fact Sheet for Healthcare Providers: https://www.woods-mathews.com/  This test is not yet approved or cleared by the [place] FDA and  has been authorized for detection and/or diagnosis of SARS-CoV-2 by FDA under an Emergency Use Authorization (EUA). This EUA will remain  in effect (meaning this test can be used) for the duration of the COVID-19 declaration under Se ction 564(b)(1) of the Act, 21 U.S.C. section  360bbb-3(b)(1), unless the authorization is terminated or revoked sooner.  Performed at [facility], [place] [address] [place], [place] [address]      Time coordinating discharge: 35 minutes  SIGNED:  Aline August, MD  [facility] 09/13/2020, 11:28 AM

## 2020-09-13 NOTE — Progress Notes (Signed)
[facility] Progress Note  Name: Timothy Holloway MRN: [id number] DOB: [birth year]   Subjective:   had 3.5 liters UOP over 8/13.  States that he can't take amlodipine due to feet swelling. He has been on concurrent lasix and HCTZ since May.  We discussed stopping HCTZ and taking lasix only as needed for now until f/u with renal.  Bedside monitor with reading of question afib.  Notifying primary team.  States he needs to change date of his sept appt due to a conflict and I let him know I would set up follow-up   Review of systems:  Denies n/v Denies chest pain or shortness of breath  --------------  [name] is an 65 y.o. male with DM (no DR or DPN), HTN, HL, obesity, DJD, CKD 3 who presented to the ED today for abnormal labs and nephrology is consulted for evaluation and management of AKI on CKD.  Creatinine baseline noted to be 1.3-1.'7mg'$ /dL per records.  In 10/2019 was 1.8 > 05/2020 Cr 3.5 and 09/09/2020 4.7 with PCP who dispatched him to ED for eval. Associated 09/09/2020 labs K 5, Bicarb 21, T prot 6.5, Alb 3.9, LFTs normal, Hb 9.1 from 9.7 in 03/2020, MCV 89, PSA 0.6.  A1c 05/2020 8.8 and 08/2020 14.3.  He is seen with his wife in the ED.  He's been in his usual state of health lately but has taken a new job this year in which he's very sedentary.  He had been quite active at work previously and losing weight but this has really stagnated.  He has no recently new symptoms or acute illnesses.  Denies new LUTs and really has only chronic nocturia q1.5-2hrs.  Has ankle edema only today; not preceding ED.  No hematuria, tea or cola colored urine, no foamy/frothy urine, no flank pain, no f/c/night sweats.  No eye, ear, oral issues, no GI issues or diarrhea.  Home BPs 140s - in the ED he's 190s after missed meds and anxiety. Doesn't feel dehydrated and no NSAID use.  No recent contrast exposure.  Pt follows with Dr. Moshe Cipro, last visit 10/01/19.  At that time his renal function on  08/3019 check was stable at 1.56.  UACR ~1100 in 10/2017.    Intake/Output Summary (Last 24 hours) at 09/13/2020 1055 Last data filed at 09/13/2020 [id number] Gross per 24 hour  Intake 1924.37 ml  Output 2975 ml  Net -1050.63 ml    Vitals:  Vitals:   09/12/20 2328 09/13/20 0335 09/13/20 0436 09/13/20 0738  BP: (!) 120/57 (!) 164/82  (!) 170/70  Pulse: (!) 55 60  (!) 58  Resp: '17 17  15  '$ Temp: 98.4 F (36.9 C) 97.9 F (36.6 C)  98.7 F (37.1 C)  TempSrc: Oral Oral  Oral  SpO2: 95% 99%  100%  Weight:  131.8 kg 131.8 kg   Height:         Physical Exam:   General adult male in bed in no acute distress HEENT normocephalic atraumatic extraocular movements intact sclera anicteric Neck supple trachea midline Lungs clear to auscultation bilaterally normal work of breathing at rest  Heart S1S2 no rub (afib on monitor - seems pretty regular on auscultation) Abdomen soft nontender nondistended Extremities no edema  Psych normal mood and affect Neuro alert and oriented x 3 provides hx and follows commands GU no foley   Medications reviewed   Labs:  BMP Latest Ref Rng & Units 09/13/2020 09/12/2020 09/11/2020  Glucose 70 - 99  mg/dL 147(H) 202(H) 259(H)  BUN 8 - 23 mg/dL 65(H) 79(H) 91(H)  Creatinine 0.61 - 1.24 mg/dL 3.32(H) 3.84(H) 4.23(H)  BUN/Creat Ratio 9 - 20 - - -  Sodium 135 - 145 mmol/L 138 138 133(L)  Potassium 3.5 - 5.1 mmol/L 3.8 3.7 3.3(L)  Chloride 98 - 111 mmol/L 106 102 101  CO2 22 - 32 mmol/L 24 24 21(L)  Calcium 8.9 - 10.3 mg/dL 8.6(L) 8.6(L) 8.3(L)     Assessment/Plan:   # AKI on CKD 3: pt with baseline Cr 1.5-1.'7mg'$ /dL presumed HTN/DM related now with uptrend in creatinine which was first noted in 05/2020 to 3.5 and now 4.7.  pre-renal insults with two diuretics (both lasix and HCTZ) and uncontrolled DM.  UA with 100 mg/dL proteinuria and 0-5 RBC; up/cr ratio 3630 mg/g in setting of uncontrolled DM.  He would want HD if needed.  Renal US with no hydro and with diffusely  increased cortical echogenicity.  - improved somewhat with supportive care  - hold lasix for now - he can resume his home lasix 80 mg daily if needed for swelling or shortness of breath - would not restart HCTZ  -  r/o myeloma - SPEP and free light chains still pending   - Hold ARB and farxiga for now  - would not resume on discharge and can assess at nephrology follow-up    # HTN:  Hold home ARB. Not on reported home dose of beta blocker but some bradycardia.  Start Hydralazine 25 mg TID.  As multiple medication discrepancies would discharge on his current regimen (plus hydralazine) with any adjustments to beta blocker as per primary team   # proteinuria  - Feel secondary to uncontrolled DM  - Defer bx for now and monitor with supportive care - note plasma cell dyscrasia w/u in process - SPEP and free light chains pending as above  # DM:  had generally been well controlled but on check 08/2020 with A1c 14+.  He's been more sedentary at his new job - says adherent to meds as he always had been.  Med titration per primary.   # Anemia, normocytic: iron deficiency. Started PO iron. R/o myeloma.    # HL: ok to cont statin for now, CK ok    Disposition - stable for discharge from a renal standpoint.  Renal will sign off. Will set up nephrology follow-up in 2 weeks at [place], MD 09/13/2020 11:19 AM

## 2020-09-13 NOTE — Plan of Care (Signed)

## 2020-09-13 NOTE — Progress Notes (Signed)
Patient discharged from the unit. Discharge instructions reviewed with the patient, patient verbalized understanding of all follow ups and discharge instructions. IV site removed, catheter intact, pressure dressing applied. All patient belongings in disposition of the patient. No further needs at this time.

## 2020-09-14 LAB — KAPPA/LAMBDA LIGHT CHAINS
Kappa free light chain: 76.7 mg/L — ABNORMAL HIGH (ref 3.3–19.4)
Kappa, lambda light chain ratio: 1.57 (ref 0.26–1.65)
Lambda free light chains: 48.8 mg/L — ABNORMAL HIGH (ref 5.7–26.3)

## 2020-09-16 LAB — PROTEIN ELECTROPHORESIS, SERUM
A/G Ratio: 1 (ref 0.7–1.7)
Albumin ELP: 3 g/dL (ref 2.9–4.4)
Alpha-1-Globulin: 0.2 g/dL (ref 0.0–0.4)
Alpha-2-Globulin: 0.8 g/dL (ref 0.4–1.0)
Beta Globulin: 1 g/dL (ref 0.7–1.3)
Gamma Globulin: 1 g/dL (ref 0.4–1.8)
Globulin, Total: 3 g/dL (ref 2.2–3.9)
Total Protein ELP: 6 g/dL (ref 6.0–8.5)

## 2020-10-14 ENCOUNTER — Encounter (HOSPITAL_COMMUNITY)
Admission: RE | Admit: 2020-10-14 | Discharge: 2020-10-14 | Disposition: A | Discharge status: Home / Self Care | Payer: BC Managed Care – PPO | Source: Ambulatory Visit | Attending: Nephrology | Admitting: Nephrology

## 2020-10-14 ENCOUNTER — Other Ambulatory Visit: Payer: Self-pay

## 2020-10-14 ENCOUNTER — Encounter (HOSPITAL_COMMUNITY): Payer: Self-pay

## 2020-10-14 DIAGNOSIS — N189 Chronic kidney disease, unspecified: Secondary | ICD-10-CM | POA: Diagnosis not present

## 2020-10-14 DIAGNOSIS — D631 Anemia in chronic kidney disease: Secondary | ICD-10-CM | POA: Diagnosis not present

## 2020-10-14 MED ORDER — SODIUM CHLORIDE 0.9 % IV SOLN: Freq: Once | INTRAVENOUS | Status: AC

## 2020-10-14 MED ORDER — SODIUM CHLORIDE 0.9 % IV SOLN
510.0000 mg | Freq: Once | INTRAVENOUS | Status: AC
  Administered 2020-10-14: 510 mg via INTRAVENOUS
  Filled 2020-10-14: qty 510

## 2020-10-21 ENCOUNTER — Other Ambulatory Visit: Payer: Self-pay

## 2020-10-21 ENCOUNTER — Encounter (HOSPITAL_COMMUNITY)
Admission: RE | Admit: 2020-10-21 | Discharge: 2020-10-21 | Disposition: A | Discharge status: Home / Self Care | Payer: BC Managed Care – PPO | Source: Ambulatory Visit | Attending: Nephrology | Admitting: Nephrology

## 2020-10-21 DIAGNOSIS — N189 Chronic kidney disease, unspecified: Secondary | ICD-10-CM | POA: Diagnosis not present

## 2020-10-21 MED ORDER — SODIUM CHLORIDE 0.9 % IV SOLN
510.0000 mg | Freq: Once | INTRAVENOUS | Status: AC
  Administered 2020-10-21: 510 mg via INTRAVENOUS
  Filled 2020-10-21: qty 510

## 2020-10-21 MED ORDER — SODIUM CHLORIDE 0.9 % IV SOLN: Freq: Once | INTRAVENOUS | Status: AC

## 2021-07-21 ENCOUNTER — Encounter (HOSPITAL_COMMUNITY): Payer: Self-pay

## 2021-07-21 ENCOUNTER — Other Ambulatory Visit: Payer: Self-pay

## 2021-07-21 ENCOUNTER — Inpatient Hospital Stay (HOSPITAL_COMMUNITY)
Admission: EM | Admit: 2021-07-21 | Discharge: 2021-07-23 | DRG: 683 | Disposition: A | Discharge status: Home / Self Care | Payer: BC Managed Care – PPO | Attending: Internal Medicine | Admitting: Internal Medicine

## 2021-07-21 ENCOUNTER — Emergency Department (HOSPITAL_COMMUNITY): Payer: BC Managed Care – PPO

## 2021-07-21 DIAGNOSIS — Z981 Arthrodesis status: Secondary | ICD-10-CM

## 2021-07-21 DIAGNOSIS — E119 Type 2 diabetes mellitus without complications: Secondary | ICD-10-CM

## 2021-07-21 DIAGNOSIS — N1831 Chronic kidney disease, stage 3a: Secondary | ICD-10-CM | POA: Diagnosis present

## 2021-07-21 DIAGNOSIS — Z888 Allergy status to other drugs, medicaments and biological substances status: Secondary | ICD-10-CM

## 2021-07-21 DIAGNOSIS — N179 Acute kidney failure, unspecified: Principal | ICD-10-CM | POA: Diagnosis present

## 2021-07-21 DIAGNOSIS — G4700 Insomnia, unspecified: Secondary | ICD-10-CM | POA: Diagnosis present

## 2021-07-21 DIAGNOSIS — Z794 Long term (current) use of insulin: Secondary | ICD-10-CM

## 2021-07-21 DIAGNOSIS — I129 Hypertensive chronic kidney disease with stage 1 through stage 4 chronic kidney disease, or unspecified chronic kidney disease: Secondary | ICD-10-CM | POA: Diagnosis present

## 2021-07-21 DIAGNOSIS — Z79899 Other long term (current) drug therapy: Secondary | ICD-10-CM

## 2021-07-21 DIAGNOSIS — I10 Essential (primary) hypertension: Secondary | ICD-10-CM | POA: Diagnosis present

## 2021-07-21 DIAGNOSIS — Z6841 Body Mass Index (BMI) 40.0 and over, adult: Secondary | ICD-10-CM

## 2021-07-21 DIAGNOSIS — N184 Chronic kidney disease, stage 4 (severe): Secondary | ICD-10-CM | POA: Diagnosis not present

## 2021-07-21 DIAGNOSIS — Z833 Family history of diabetes mellitus: Secondary | ICD-10-CM

## 2021-07-21 DIAGNOSIS — Z7982 Long term (current) use of aspirin: Secondary | ICD-10-CM

## 2021-07-21 DIAGNOSIS — R001 Bradycardia, unspecified: Secondary | ICD-10-CM | POA: Diagnosis present

## 2021-07-21 DIAGNOSIS — J329 Chronic sinusitis, unspecified: Secondary | ICD-10-CM

## 2021-07-21 DIAGNOSIS — Z807 Family history of other malignant neoplasms of lymphoid, hematopoietic and related tissues: Secondary | ICD-10-CM

## 2021-07-21 DIAGNOSIS — E1122 Type 2 diabetes mellitus with diabetic chronic kidney disease: Secondary | ICD-10-CM | POA: Diagnosis present

## 2021-07-21 DIAGNOSIS — D631 Anemia in chronic kidney disease: Secondary | ICD-10-CM

## 2021-07-21 DIAGNOSIS — Z91040 Latex allergy status: Secondary | ICD-10-CM

## 2021-07-21 DIAGNOSIS — E785 Hyperlipidemia, unspecified: Secondary | ICD-10-CM | POA: Diagnosis present

## 2021-07-21 LAB — COMPREHENSIVE METABOLIC PANEL
ALT: 18 U/L (ref 0–44)
AST: 17 U/L (ref 15–41)
Albumin: 3.1 g/dL — ABNORMAL LOW (ref 3.5–5.0)
Alkaline Phosphatase: 56 U/L (ref 38–126)
Anion gap: 12 (ref 5–15)
BUN: 137 mg/dL — ABNORMAL HIGH (ref 8–23)
CO2: 20 mmol/L — ABNORMAL LOW (ref 22–32)
Calcium: 8.7 mg/dL — ABNORMAL LOW (ref 8.9–10.3)
Chloride: 106 mmol/L (ref 98–111)
Creatinine, Ser: 5.49 mg/dL — ABNORMAL HIGH (ref 0.61–1.24)
GFR, Estimated: 11 mL/min — ABNORMAL LOW (ref >60)
Glucose, Bld: 78 mg/dL (ref 70–99)
Potassium: 4.4 mmol/L (ref 3.5–5.1)
Sodium: 138 mmol/L (ref 135–145)
Total Bilirubin: 0.6 mg/dL (ref 0.3–1.2)
Total Protein: 7.2 g/dL (ref 6.5–8.1)

## 2021-07-21 LAB — CBC WITH DIFFERENTIAL/PLATELET
Abs Immature Granulocytes: 0.49 10*3/uL — ABNORMAL HIGH (ref 0.00–0.07)
Basophils Absolute: 0.1 10*3/uL (ref 0.0–0.1)
Basophils Relative: 1 %
Eosinophils Absolute: 0.4 10*3/uL (ref 0.0–0.5)
Eosinophils Relative: 3 %
HCT: 28.8 % — ABNORMAL LOW (ref 39.0–52.0)
Hemoglobin: 9.6 g/dL — ABNORMAL LOW (ref 13.0–17.0)
Immature Granulocytes: 5 %
Lymphocytes Relative: 16 %
Lymphs Abs: 1.8 10*3/uL (ref 0.7–4.0)
MCH: 30.7 pg (ref 26.0–34.0)
MCHC: 33.3 g/dL (ref 30.0–36.0)
MCV: 92 fL (ref 80.0–100.0)
Monocytes Absolute: 0.8 10*3/uL (ref 0.1–1.0)
Monocytes Relative: 7 %
Neutro Abs: 7.5 10*3/uL (ref 1.7–7.7)
Neutrophils Relative %: 68 %
Platelets: 265 10*3/uL (ref 150–400)
RBC: 3.13 MIL/uL — ABNORMAL LOW (ref 4.22–5.81)
RDW: 12.6 % (ref 11.5–15.5)
WBC: 11 10*3/uL — ABNORMAL HIGH (ref 4.0–10.5)
nRBC: 0 % (ref 0.0–0.2)

## 2021-07-21 LAB — URINALYSIS, ROUTINE W REFLEX MICROSCOPIC
Bacteria, UA: NONE SEEN
Bilirubin Urine: NEGATIVE
Glucose, UA: >=500 mg/dL — AB
Hgb urine dipstick: NEGATIVE
Ketones, ur: NEGATIVE mg/dL
Leukocytes,Ua: NEGATIVE
Nitrite: NEGATIVE
Protein, ur: 100 mg/dL — AB
Specific Gravity, Urine: 1.009 (ref 1.005–1.030)
pH: 5 (ref 5.0–8.0)

## 2021-07-21 LAB — CBG MONITORING, ED
Glucose-Capillary: 117 mg/dL — ABNORMAL HIGH (ref 70–99)
Glucose-Capillary: 316 mg/dL — ABNORMAL HIGH (ref 70–99)

## 2021-07-21 LAB — MAGNESIUM: Magnesium: 2.4 mg/dL (ref 1.7–2.4)

## 2021-07-21 LAB — GLUCOSE, CAPILLARY: Glucose-Capillary: 440 mg/dL — ABNORMAL HIGH (ref 70–99)

## 2021-07-21 LAB — PHOSPHORUS: Phosphorus: 5.7 mg/dL — ABNORMAL HIGH (ref 2.5–4.6)

## 2021-07-21 MED ORDER — ASPIRIN 81 MG PO TBEC
81.0000 mg | DELAYED_RELEASE_TABLET | Freq: Every day | ORAL | Status: DC
  Administered 2021-07-21 – 2021-07-23 (×3): 81 mg via ORAL
  Filled 2021-07-21 (×4): qty 1

## 2021-07-21 MED ORDER — ZOLPIDEM TARTRATE 5 MG PO TABS
10.0000 mg | ORAL_TABLET | Freq: Every evening | ORAL | Status: DC | PRN
  Administered 2021-07-21 – 2021-07-22 (×2): 10 mg via ORAL
  Filled 2021-07-21 (×2): qty 2

## 2021-07-21 MED ORDER — SODIUM CHLORIDE 0.9 % IV BOLUS
1000.0000 mL | Freq: Once | INTRAVENOUS | Status: AC
  Administered 2021-07-21: 1000 mL via INTRAVENOUS

## 2021-07-21 MED ORDER — SODIUM CHLORIDE 0.9 % IV SOLN: INTRAVENOUS | Status: DC

## 2021-07-21 MED ORDER — ACETAMINOPHEN 325 MG PO TABS: 650.0000 mg | ORAL_TABLET | Freq: Four times a day (QID) | ORAL | Status: DC | PRN

## 2021-07-21 MED ORDER — FENOFIBRATE 54 MG PO TABS
54.0000 mg | ORAL_TABLET | Freq: Every day | ORAL | Status: DC
  Administered 2021-07-22 – 2021-07-23 (×2): 54 mg via ORAL
  Filled 2021-07-21 (×2): qty 1

## 2021-07-21 MED ORDER — CLONIDINE HCL 0.1 MG PO TABS
0.1000 mg | ORAL_TABLET | Freq: Two times a day (BID) | ORAL | Status: DC
  Administered 2021-07-21: 0.1 mg via ORAL
  Filled 2021-07-21: qty 1

## 2021-07-21 MED ORDER — HYDRALAZINE HCL 25 MG PO TABS
25.0000 mg | ORAL_TABLET | Freq: Three times a day (TID) | ORAL | Status: DC
  Administered 2021-07-21 – 2021-07-23 (×6): 25 mg via ORAL
  Filled 2021-07-21 (×6): qty 1

## 2021-07-21 MED ORDER — SODIUM CHLORIDE 0.9 % IV SOLN: INTRAVENOUS | Status: AC

## 2021-07-21 MED ORDER — INSULIN ASPART 100 UNIT/ML IJ SOLN
0.0000 [IU] | Freq: Three times a day (TID) | INTRAMUSCULAR | Status: DC
  Administered 2021-07-21: 4 [IU] via SUBCUTANEOUS

## 2021-07-21 MED ORDER — SALINE SPRAY 0.65 % NA SOLN
1.0000 | Freq: Every day | NASAL | Status: DC | PRN
  Administered 2021-07-22: 1 via NASAL
  Filled 2021-07-21: qty 44

## 2021-07-21 MED ORDER — INSULIN ASPART 100 UNIT/ML IJ SOLN
5.0000 [IU] | INTRAMUSCULAR | Status: AC
Start: 2021-07-21 — End: 2021-07-21
  Administered 2021-07-21: 5 [IU] via INTRAVENOUS

## 2021-07-21 MED ORDER — HEPARIN SODIUM (PORCINE) 5000 UNIT/ML IJ SOLN
5000.0000 [IU] | Freq: Three times a day (TID) | INTRAMUSCULAR | Status: DC
  Administered 2021-07-21 – 2021-07-23 (×6): 5000 [IU] via SUBCUTANEOUS
  Filled 2021-07-21 (×6): qty 1

## 2021-07-21 MED ORDER — HYDRALAZINE HCL 20 MG/ML IJ SOLN
10.0000 mg | Freq: Three times a day (TID) | INTRAMUSCULAR | Status: DC | PRN
  Filled 2021-07-21: qty 1

## 2021-07-21 MED ORDER — ATORVASTATIN CALCIUM 10 MG PO TABS
10.0000 mg | ORAL_TABLET | Freq: Every day | ORAL | Status: DC
  Administered 2021-07-21 – 2021-07-23 (×3): 10 mg via ORAL
  Filled 2021-07-21 (×3): qty 1

## 2021-07-21 MED ORDER — ACETAMINOPHEN 650 MG RE SUPP: 650.0000 mg | Freq: Four times a day (QID) | RECTAL | Status: DC | PRN

## 2021-07-21 MED ORDER — ESCITALOPRAM OXALATE 10 MG PO TABS
10.0000 mg | ORAL_TABLET | Freq: Every day | ORAL | Status: DC
  Administered 2021-07-21 – 2021-07-23 (×3): 10 mg via ORAL
  Filled 2021-07-21 (×3): qty 1

## 2021-07-21 MED ORDER — AMLODIPINE BESYLATE 10 MG PO TABS
10.0000 mg | ORAL_TABLET | Freq: Every day | ORAL | Status: DC
  Administered 2021-07-21 – 2021-07-23 (×3): 10 mg via ORAL
  Filled 2021-07-21: qty 2
  Filled 2021-07-21 (×2): qty 1

## 2021-07-21 MED ORDER — FERROUS SULFATE 325 (65 FE) MG PO TABS
325.0000 mg | ORAL_TABLET | Freq: Two times a day (BID) | ORAL | Status: DC
  Administered 2021-07-21 – 2021-07-23 (×4): 325 mg via ORAL
  Filled 2021-07-21 (×4): qty 1

## 2021-07-21 MED ORDER — INSULIN DETEMIR 100 UNIT/ML ~~LOC~~ SOLN
50.0000 [IU] | Freq: Every day | SUBCUTANEOUS | Status: DC
  Administered 2021-07-21 – 2021-07-23 (×3): 50 [IU] via SUBCUTANEOUS
  Filled 2021-07-21 (×3): qty 0.5

## 2021-07-21 MED ORDER — ALBUTEROL SULFATE HFA 108 (90 BASE) MCG/ACT IN AERS
2.0000 | INHALATION_SPRAY | Freq: Once | RESPIRATORY_TRACT | Status: AC
  Administered 2021-07-21: 2 via RESPIRATORY_TRACT
  Filled 2021-07-21: qty 6.7

## 2021-07-21 NOTE — ED Provider Triage Note (Signed)
Emergency Medicine Provider Triage Evaluation Note  Timothy Holloway , a 66 y.o. male  was evaluated in triage.  Pt complains of abnormal labs. Received call from PCP that creatinine was elevated from routine physical and to come to the ED. Asymptomatic. Denies fever, chills, N/V/D, abdominal pain, or problems urinating. No new meds or lifestyle changes.   Review of Systems  Per above  Physical Exam  BP (!) 200/61 (BP Location: Right Arm)   Pulse (!) 51   Temp 97.9 F (36.6 C) (Oral)   Resp 18   Ht 6' (1.829 m)   Wt 136.1 kg   SpO2 99%   BMI 40.69 kg/m  Gen:   Awake, no distress   Resp:  Normal effort  MSK:   Moves extremities without difficulty   Medical Decision Making  Medically screening exam initiated at 4:57 AM.  Appropriate orders placed.  Timothy Holloway was informed that the remainder of the evaluation will be completed by another provider, this initial triage assessment does not replace that evaluation, and the importance of remaining in the ED until their evaluation is complete.  Abnormal labs.    Timothy Holloway, [place] 07/21/21 [id number]

## 2021-07-21 NOTE — Assessment & Plan Note (Signed)
Continue lipitor  ?

## 2021-07-21 NOTE — Assessment & Plan Note (Addendum)
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 ? If progressing to stage V.  -nephrology consulted and will be following. ? If needs dialysis in near future  -gentle time limited IVF and trend

## 2021-07-21 NOTE — ED Notes (Signed)
Patient's wife [name] asked for [place] to call her when the patient goes back to a room.

## 2021-07-21 NOTE — H&P (Signed)
History and Physical    Patient: Timothy Holloway [id number] DOB: 1955/11/04 DOA: 07/21/2021 DOS: the patient was seen and examined on 07/21/2021 PCP: [facility]  Patient coming from: Home - lives with his wife.    Chief Complaint: abnormal labs   HPI: Timothy Holloway is a 66 y.o. male with medical history significant of T2DM, CKD stage 3, HLD, HTN, obesity who presented to ED for abnormal labs at his PCP.  He is asymptomatic.  He has been working and denies any increased leg swelling, difficulty or change in urination or NSAID use. He has had no N/V/D. He drinks plenty of water. He has had some sinus issues, drainage. Saw his PCP yesterday and given zpack for "sinus stuff." He denies any fever/chills, sinus pain or pressure, tooth pain.   He sees his nephrologist on a regular basis: Dr. Moshe Cipro   He has been feeling good. Denies any fever/chills, vision changes/headaches, chest pain or palpitations, shortness of breath or cough, abdominal pain, N/V/D, dysuria or leg swelling.    He does not smoke or drink alcohol   ER Course:  vitals: afebrile, bp: 200/61, HR: 51, RR: 18, oxygen: 99%RA Pertinent labs: WBC: 11, hgb: 9.6, creatinine: 5.49 (3-4), BUN: 137 CXR: mild cardiomegaly. No acute process In ED: given 1L IVF and nephrology consulted (Dr. Hollie Salk)     Review of Systems: As mentioned in the history of present illness. All other systems reviewed and are negative. Past Medical History:  Diagnosis Date   Cataract    Chronic kidney disease (CKD), stage III (moderate) ([place])    dr Buddy Duty   Diabetes mellitus without complication ([place])    Hyperlipidemia    Hypertension    Impotence    Insomnia    Obesity    Past Surgical History:  Procedure Laterality Date   KNEE SURGERY Left 01-31-2002   SPINE SURGERY     fusion of L4-L5   Social History:  reports that he has never smoked. He does not have any smokeless tobacco history on file. He  reports that he does not drink alcohol and does not use drugs.  Allergies  Allergen Reactions   Latex Hives   Lodine [Etodolac] Nausea And Vomiting    Family History  Problem Relation Age of Onset   Diabetes Mother    Cancer Father        lymphoma    Prior to Admission medications   Medication Sig Start Date End Date Taking? Authorizing Provider  acetaminophen (TYLENOL) 325 MG tablet Take 650 mg by mouth every 6 (six) hours as needed for moderate pain. 06/06/20   [provider]  aspirin 81 MG tablet Take 81 mg by mouth daily.    [provider]  atorvastatin (LIPITOR) 10 MG tablet Take 1 tablet (10 mg total) by mouth daily. 08/06/12   Vernie Shanks, MD  carvedilol (COREG) 3.125 MG tablet Take 1 tablet (3.125 mg total) by mouth 2 (two) times daily with a meal. 09/13/20   Aline August, MD  Cholecalciferol (VITAMIN D) 2000 UNITS CAPS Take 2,000 Units by mouth daily.    [provider]  cloNIDine (CATAPRES) 0.1 MG tablet Take 0.1 mg by mouth 2 (two) times daily. 07/13/20   [provider]  co-enzyme Q-10 30 MG capsule Take 30 mg by mouth daily.    [provider]  cyclobenzaprine (FLEXERIL) 10 MG tablet TAKE ONE TABLET BY MOUTH AT BEDTIME Patient taking differently: Take 10 mg  by mouth at bedtime. 01/29/13   Vernie Shanks, MD  diclofenac Sodium (VOLTAREN) 1 % GEL Apply 2 g topically daily as needed (pain).    [provider]  diphenhydrAMINE (BENADRYL) 25 MG tablet Take 25 mg by mouth every 6 (six) hours as needed for allergies.    [provider]  Dulaglutide (TRULICITY) 1.5 JA/2.5KN SOPN Inject 1.5 mg into the skin once a week. Mondays    [provider]  EPINEPHrine 0.3 mg/0.3 mL IJ SOAJ injection Inject 0.3 mg into the muscle once as needed for anaphylaxis. 07/11/17   [provider]  escitalopram (LEXAPRO) 10 MG tablet Take 10 mg by mouth daily. 07/13/20   [provider]  fenofibrate (TRICOR) 145  MG tablet Take 1/2 tablet daily Patient taking differently: Take 72.5 mg by mouth daily. 09/06/13   Chipper Herb, MD  ferrous sulfate 325 (65 FE) MG tablet Take 325 mg by mouth 2 (two) times daily with a meal.    [provider]  furosemide (LASIX) 80 MG tablet Take 1 tablet (80 mg total) by mouth daily as needed for fluid or edema (shortness of breath). 09/13/20   Aline August, MD  Glucosamine-Chondroit-Vit C-Mn (GLUCOSAMINE CHONDR 1500 COMPLX) CAPS Take 1 capsule by mouth 2 (two) times daily.    [provider]  hydrALAZINE (APRESOLINE) 25 MG tablet Take 1 tablet (25 mg total) by mouth every 8 (eight) hours. 09/13/20   Aline August, MD  insulin detemir (LEVEMIR FLEXTOUCH) 100 UNIT/ML FlexPen Inject 50 Units into the skin daily. 09/13/20   Aline August, MD  meclizine (ANTIVERT) 25 MG tablet Take 25 mg by mouth daily as needed for dizziness. 04/24/15   [provider]  methocarbamol (ROBAXIN) 500 MG tablet Take 1 tablet by mouth daily as needed for muscle spasms. 10/14/19   [provider]  Multiple Vitamin (MULTIVITAMIN) tablet Take 1 tablet by mouth daily.    [provider]  sodium chloride (OCEAN) 0.65 % SOLN nasal spray Place 1 spray into both nostrils daily as needed for congestion.    [provider]  zolpidem (AMBIEN) 10 MG tablet Take 1 tablet (10 mg total) by mouth at bedtime as needed for sleep. 01/11/13   Vernie Shanks, MD    Physical Exam: Vitals:   07/21/21 1600 07/21/21 1630 07/21/21 1700 07/21/21 1730  BP: (!) 179/67 (!) 175/65 (!) 175/69 (!) 169/67  Pulse: (!) 56 (!) 59 61 65  Resp: 14 18 16 15   Temp:      TempSrc:      SpO2: 98% 98% 97% 98%  Weight:      Height:       General:  Appears calm and comfortable and is in NAD Eyes:  PERRL, EOMI, normal lids, iris ENT:  grossly normal hearing, lips & tongue, mmm; appropriate dentition. No TTP over sinuses, no nasal turbinate edema. TM pearly bilaterally  Neck:  no LAD,  masses or thyromegaly; no carotid bruits Cardiovascular:  sinus bradycardia, no m/r/g. 3+ pitting edema to bilateral  LE R>L.  Respiratory:   CTA bilaterally with no wheezes/rales/rhonchi.  Normal respiratory effort. Abdomen:  soft, NT, ND, NABS Back:   normal alignment, no CVAT Skin:  no rash or induration seen on limited exam Musculoskeletal:  grossly normal tone BUE/BLE, good ROM, no bony abnormality Lower extremity:  Limited foot exam with no ulcerations.  2+ distal pulses. Psychiatric:  grossly normal mood and affect, speech fluent and appropriate, AOx3 Neurologic:  CN  2-12 grossly intact, moves all extremities in coordinated fashion, sensation intact   Radiological Exams on Admission: Independently reviewed - see discussion in A/P where applicable  DG Chest Port 1 View  Result Date: 07/21/2021 CLINICAL DATA:  Provided history: Shortness of breath. EXAM: PORTABLE CHEST 1 VIEW COMPARISON:  No pertinent prior exams available for comparison. FINDINGS: Mild cardiomegaly. Aortic atherosclerosis. No appreciable airspace consolidation or pulmonary edema. No evidence of pleural effusion or pneumothorax. No acute bony abnormality identified. IMPRESSION: No evidence of acute cardiopulmonary abnormality. Mild cardiomegaly. Aortic Atherosclerosis (ICD10-I70.0). Electronically Signed   By: Kellie Simmering D.O.   On: 07/21/2021 12:16    EKG: pending    Labs on Admission: I have personally reviewed the available labs and imaging studies at the time of the admission.  Pertinent labs:   WBC: 11,  hgb: 9.6,  creatinine: 5.49 (3-4),  BUN: 137  Assessment and Plan: Principal Problem:   Acute renal failure superimposed on stage 4 chronic kidney disease (HCC) Active Problems:   HTN (hypertension)   Diabetes mellitus (HCC)   HLD (hyperlipidemia)   Anemia due to chronic kidney disease   Insomnia   Sinusitis    Assessment and Plan: * Acute renal failure superimposed on stage 4 chronic kidney  disease ([place]) 66 year old male presenting to ED with abnormal lab work of his renal fucntion at his PCP office yesterday found to have acute on chronic kidney disease stage 4  -observation to telemetry -hold nephrotoxic drugs: hctz/cozaar/lasix/farxiga  -strict I/O -UA with no hemoglobin  -urine studies -baseline creatinine is around 3-4, up to 5.49 today and asymptomatic. ? If progressing to stage V.  -nephrology consulted and will be following. ? If needs dialysis in near future  -gentle time limited IVF and trend   HTN (hypertension) Quite HTN and states he has had a hard time controlling bp at home, in setting of worsening renal disease.  Holding his home coazaar and hctz with AKI Holding coreg with bradycardia <50, states he normally runs in the 60s for his HR ekg pending, telemetry  Continue clonidine, hydralazine-titrate as needed  Prn IV hydralazine   Diabetes mellitus (HCC) Recent A1C 8.2 done yesterday Hold farxiga  continue levemir, decrease dose to 50 units with AKI Very sensitive SSI and accuchecks ac/hs   HLD (hyperlipidemia) Continue lipitor   Anemia due to chronic kidney disease Baseline hgb around 8-9 Stable, continue to monitor   Insomnia Continue prn ambien   Sinusitis Questionable history of sinusitis; however, he has no sinus pain on palpation, tooth pain or fevers. He has not edema in turbinates.  Discussed antibiotics not recommended and that his CXR was clear. Will hold for now  Continue nasal spray prn.     Advance Care Planning:   Code Status: Full Code   Consults: nephrology: Dr. Hollie Salk   DVT Prophylaxis: heparin   Family Communication: wife at bedside: [name] Takaki   Severity of Illness: The appropriate patient status for this patient is OBSERVATION. Observation status is judged to be reasonable and necessary in order to provide the required intensity of service to ensure the patient's safety. The patient's presenting symptoms,  physical exam findings, and initial radiographic and laboratory data in the context of their medical condition is felt to place them at decreased risk for further clinical deterioration. Furthermore, it is anticipated that the patient will be medically stable for discharge from the hospital within 2 midnights of admission.   Author: Orma Flaming, MD 07/21/2021 7:11 PM  For on call review [email or web address].

## 2021-07-21 NOTE — ED Triage Notes (Signed)
Pt arrived POV from home stating he seen the kidney doctor at [facility] yesterday and they called this morning stating to come to the ER his creatine level was elevated.

## 2021-07-21 NOTE — ED Provider Notes (Signed)
[facility] Provider Note   CSN: [id number] Arrival date & time: 07/21/21  0424     History  Chief Complaint  Patient presents with   abnormal labs    Timothy Holloway is a 66 y.o. male.  66 yo M with a cc of abnormal labs.  Patient with ckd, seen yesterday by PCP.  Called and told that his labs were worse and to come into the hospital.  Patient denies any symptoms denies leg swelling denies dysgeusia denies difficulty breathing.  Denies fatigue.        Home Medications Prior to Admission medications   Medication Sig Start Date End Date Taking? Authorizing Provider  acetaminophen (TYLENOL) 325 MG tablet Take 650 mg by mouth every 6 (six) hours as needed for moderate pain. 06/06/20   [provider]  aspirin 81 MG tablet Take 81 mg by mouth daily.    [provider]  atorvastatin (LIPITOR) 10 MG tablet Take 1 tablet (10 mg total) by mouth daily. 08/06/12   Vernie Shanks, MD  carvedilol (COREG) 3.125 MG tablet Take 1 tablet (3.125 mg total) by mouth 2 (two) times daily with a meal. 09/13/20   Aline August, MD  Cholecalciferol (VITAMIN D) 2000 UNITS CAPS Take 2,000 Units by mouth daily.    [provider]  cloNIDine (CATAPRES) 0.1 MG tablet Take 0.1 mg by mouth 2 (two) times daily. 07/13/20   [provider]  co-enzyme Q-10 30 MG capsule Take 30 mg by mouth daily.    [provider]  cyclobenzaprine (FLEXERIL) 10 MG tablet TAKE ONE TABLET BY MOUTH AT BEDTIME Patient taking differently: Take 10 mg by mouth at bedtime. 01/29/13   Vernie Shanks, MD  diclofenac Sodium (VOLTAREN) 1 % GEL Apply 2 g topically daily as needed (pain).    [provider]  diphenhydrAMINE (BENADRYL) 25 MG tablet Take 25 mg by mouth every 6 (six) hours as needed for allergies.    [provider]  Dulaglutide (TRULICITY) 1.5 ON/6.2XB SOPN Inject 1.5 mg into the skin once a week. Mondays    [provider]  EPINEPHrine 0.3 mg/0.3 mL IJ SOAJ injection Inject 0.3 mg into the muscle once as needed for anaphylaxis. 07/11/17   [provider]  escitalopram (LEXAPRO) 10 MG tablet Take 10 mg by mouth daily. 07/13/20   [provider]  fenofibrate (TRICOR) 145 MG tablet Take 1/2 tablet daily Patient taking differently: Take 72.5 mg by mouth daily. 09/06/13   Chipper Herb, MD  ferrous sulfate 325 (65 FE) MG tablet Take 325 mg by mouth 2 (two) times daily with a meal.    [provider]  furosemide (LASIX) 80 MG tablet Take 1 tablet (80 mg total) by mouth daily as needed for fluid or edema (shortness of breath). 09/13/20   Aline August, MD  Glucosamine-Chondroit-Vit C-Mn (GLUCOSAMINE CHONDR 1500 COMPLX) CAPS Take 1 capsule by mouth 2 (two) times daily.    [provider]  hydrALAZINE (APRESOLINE) 25 MG tablet Take 1 tablet (25 mg total) by mouth every 8 (eight) hours. 09/13/20   Aline August, MD  insulin detemir (LEVEMIR FLEXTOUCH) 100 UNIT/ML FlexPen Inject 50 Units into the skin daily. 09/13/20   Aline August, MD  meclizine (ANTIVERT) 25 MG tablet Take 25 mg by mouth daily as needed for dizziness. 04/24/15   [provider]  methocarbamol (ROBAXIN) 500 MG tablet Take 1 tablet by mouth daily as needed for muscle spasms.  10/14/19   [provider]  Multiple Vitamin (MULTIVITAMIN) tablet Take 1 tablet by mouth daily.    [provider]  sodium chloride (OCEAN) 0.65 % SOLN nasal spray Place 1 spray into both nostrils daily as needed for congestion.    [provider]  zolpidem (AMBIEN) 10 MG tablet Take 1 tablet (10 mg total) by mouth at bedtime as needed for sleep. 01/11/13   Vernie Shanks, MD      Allergies    Latex and Lodine [etodolac]    Review of Systems   Review of Systems  Physical Exam Updated Vital Signs BP (!) 189/73 (BP Location: Right Arm)   Pulse (!) 51   Temp 97.9 F (36.6 C) (Oral)   Resp 18   Ht  6' (1.829 m)   Wt 136.1 kg   SpO2 97%   BMI 40.69 kg/m  Physical Exam Vitals and nursing note reviewed.  Constitutional:      Appearance: He is well-developed.  HENT:     Head: Normocephalic and atraumatic.  Eyes:     Pupils: Pupils are equal, round, and reactive to light.  Neck:     Vascular: No JVD.  Cardiovascular:     Rate and Rhythm: Normal rate and regular rhythm.     Heart sounds: No murmur heard.    No friction rub. No gallop.  Pulmonary:     Effort: No respiratory distress.     Breath sounds: Wheezing present.     Comments: Scattered wheezes with prolonged expiratory effort. Abdominal:     General: There is no distension.     Tenderness: There is no abdominal tenderness. There is no guarding or rebound.  Musculoskeletal:        General: Normal range of motion.     Cervical back: Normal range of motion and neck supple.     Right lower leg: Edema present.     Left lower leg: Edema present.     Comments: 2+ edema to bilateral lower extremities chronic per patient.  Skin:    Coloration: Skin is not pale.     Findings: No rash.  Neurological:     Mental Status: He is alert and oriented to person, place, and time.  Psychiatric:        Behavior: Behavior normal.     ED Results / Procedures / Treatments   Labs (all labs ordered are listed, but only abnormal results are displayed) Labs Reviewed  COMPREHENSIVE METABOLIC PANEL - Abnormal; Notable for the following components:      Result Value   CO2 20 (*)    BUN 137 (*)    Creatinine, Ser 5.49 (*)    Calcium 8.7 (*)    Albumin 3.1 (*)    GFR, Estimated 11 (*)    All other components within normal limits  CBC WITH DIFFERENTIAL/PLATELET - Abnormal; Notable for the following components:   WBC 11.0 (*)    RBC 3.13 (*)    Hemoglobin 9.6 (*)    HCT 28.8 (*)    Abs Immature Granulocytes 0.49 (*)    All other components within normal limits  URINALYSIS, ROUTINE W REFLEX MICROSCOPIC - Abnormal; Notable for the  following components:   Glucose, UA >=500 (*)    Protein, ur 100 (*)    All other components within normal limits    EKG None  Radiology No results found.  Procedures Procedures    Medications Ordered in ED Medications  sodium chloride 0.9 % bolus  1,000 mL (has no administration in time range)  albuterol (VENTOLIN HFA) 108 (90 Base) MCG/ACT inhaler 2 puff (has no administration in time range)    ED Course/ Medical Decision Making/ A&P                           Medical Decision Making Amount and/or Complexity of Data Reviewed Radiology: ordered.  Risk Prescription drug management.   66 yO M with a chief complaint of abnormal labs.  Was called at home and told that his kidney function gotten much worse and was told that he needed to come to the hospital for evaluation.  Not clinically fluid overloaded but does have some leg edema that he says is chronic.  His GFR went from 20-11 from last check.  His BUN is 137.  He is acidotic with a bicarb of 20.  Anemic.  UA with trace protein.  He is wheezing on exam to me has been having a bit of a sinus thing with some coughing.  Will obtain a chest x-ray give 2 puffs of albuterol.  Will discuss with nephrology.  I discussed case with Dr. Hollie Salk.  She is concerned about his level of uremia.  Recommended medicine put him in overnight stop his diuretics and ARB.  Gentle fluids.  We will discuss with medicine for admission.  The patients results and plan were reviewed and discussed.   Any x-rays performed were independently reviewed by myself.   Differential diagnosis were considered with the presenting HPI.  Medications  sodium chloride 0.9 % bolus 1,000 mL (has no administration in time range)  albuterol (VENTOLIN HFA) 108 (90 Base) MCG/ACT inhaler 2 puff (has no administration in time range)    Vitals:   07/21/21 0440 07/21/21 0445 07/21/21 0735 07/21/21 1023  BP: (!) 200/61  (!) 195/76 (!) 189/73  Pulse: (!) 51  (!) 51 (!) 51   Resp: 18  16 18   Temp: 97.9 F (36.6 C)     TempSrc: Oral     SpO2: 99%  99% 97%  Weight:  136.1 kg    Height:  6' (1.829 m)      Final diagnoses:  AKI (acute kidney injury) ([place])    Admission/ observation were discussed with the admitting physician, patient and/or family and they are comfortable with the plan.          Final Clinical Impression(s) / ED Diagnoses Final diagnoses:  None    Rx / DC Orders ED Discharge Orders     None         Deno Etienne, DO 07/21/21 1232

## 2021-07-21 NOTE — Assessment & Plan Note (Addendum)
Quite HTN and states he has had a hard time controlling bp at home, in setting of worsening renal disease.  Holding his home coazaar and hctz with AKI Holding coreg with bradycardia <50, states he normally runs in the 60s for his HR ekg pending, telemetry  Continue clonidine, hydralazine-titrate as needed  Prn IV hydralazine

## 2021-07-21 NOTE — Assessment & Plan Note (Signed)
Continue prn Timothy Holloway

## 2021-07-21 NOTE — Assessment & Plan Note (Addendum)
Recent A1C 8.2 done yesterday Hold farxiga  continue levemir, decrease dose to 50 units with AKI Very sensitive SSI and accuchecks ac/hs

## 2021-07-21 NOTE — ED Notes (Signed)
ED TO INPATIENT HANDOFF REPORT  ED Nurse Name and Phone #: [phone]  S Name/Age/Gender Timothy Holloway 66 y.o. male Room/Bed: 038C/038C  Code Status   Code Status: Full Code  Home/SNF/Other Home Patient oriented to: self, place, time, and situation Is this baseline? Yes   Triage Complete: Triage complete  Chief Complaint Acute renal failure superimposed on stage 3b chronic kidney disease ([facility]) [N17.9, N18.32]  Triage Note Pt arrived POV from home stating he seen the kidney doctor at [facility] yesterday and they called this morning stating to come to the ER his creatine level was elevated.    Allergies Allergies  Allergen Reactions   Latex Hives   Lodine [Etodolac] Nausea And Vomiting    Level of Care/Admitting Diagnosis ED Disposition     ED Disposition  Admit   Condition  --   Comment  Hospital Area: [facility] [[id number]]  Level of Care: Telemetry Medical [104]  May place patient in observation at [facility] or [facility] if equivalent level of care is available:: No  Covid Evaluation: Asymptomatic - no recent exposure (last 10 days) testing not required  Diagnosis: Acute renal failure superimposed on stage 3b chronic kidney disease [facility]) [[id number]]  Admitting Physician: Orma Flaming [[id number]]  Attending Physician: Orma Flaming [[id number]]          B Medical/Surgery History Past Medical History:  Diagnosis Date   Cataract    Chronic kidney disease (CKD), stage III (moderate) ([place])    dr Buddy Duty   Diabetes mellitus without complication ([place])    Hyperlipidemia    Hypertension    Impotence    Insomnia    Obesity    Past Surgical History:  Procedure Laterality Date   KNEE SURGERY Left 01-31-2002   SPINE SURGERY     fusion of L4-L5     A IV Location/Drains/Wounds Patient Lines/Drains/Airways Status     Active Line/Drains/Airways     Name Placement date Placement time Site Days   Peripheral IV 07/21/21 20 G  Anterior;Distal;Left;Upper Arm 07/21/21  1240  Arm  less than 1            Intake/Output Last 24 hours No intake or output data in the 24 hours ending 07/21/21 1758  Labs/Imaging Results for orders placed or performed during the hospital encounter of 07/21/21 (from the past 48 hour(s))  Urinalysis, Routine w reflex microscopic Urine, Clean Catch     Status: Abnormal   Collection Time: 07/21/21  4:59 AM  Result Value Ref Range   Color, Urine YELLOW YELLOW   APPearance CLEAR CLEAR   Specific Gravity, Urine 1.009 1.005 - 1.030   pH 5.0 5.0 - 8.0   Glucose, UA >=500 (A) NEGATIVE mg/dL   Hgb urine dipstick NEGATIVE NEGATIVE   Bilirubin Urine NEGATIVE NEGATIVE   Ketones, ur NEGATIVE NEGATIVE mg/dL   Protein, ur 100 (A) NEGATIVE mg/dL   Nitrite NEGATIVE NEGATIVE   Leukocytes,Ua NEGATIVE NEGATIVE   RBC / HPF 0-5 0 - 5 RBC/hpf   WBC, UA 0-5 0 - 5 WBC/hpf   Bacteria, UA NONE SEEN NONE SEEN   Squamous Epithelial / LPF 0-5 0 - 5    Comment: Performed at [facility], [address], [place], [place] [address]  Comprehensive metabolic panel     Status: Abnormal   Collection Time: 07/21/21  5:04 AM  Result Value Ref Range   Sodium 138 135 - 145 mmol/L   Potassium 4.4 3.5 - 5.1 mmol/L  Chloride 106 98 - 111 mmol/L   CO2 20 (L) 22 - 32 mmol/L   Glucose, Bld 78 70 - 99 mg/dL    Comment: Glucose reference range applies only to samples taken after fasting for at least 8 hours.   BUN 137 (H) 8 - 23 mg/dL   Creatinine, Ser 5.49 (H) 0.61 - 1.24 mg/dL   Calcium 8.7 (L) 8.9 - 10.3 mg/dL   Total Protein 7.2 6.5 - 8.1 g/dL   Albumin 3.1 (L) 3.5 - 5.0 g/dL   AST 17 15 - 41 U/L   ALT 18 0 - 44 U/L   Alkaline Phosphatase 56 38 - 126 U/L   Total Bilirubin 0.6 0.3 - 1.2 mg/dL   GFR, Estimated 11 (L) >60 mL/min    Comment: (NOTE) Calculated using the CKD-EPI Creatinine Equation (2021)    Anion gap 12 5 - 15    Comment: Performed at [facility], [place] [address], [place],  [place] [address]  CBC with Differential     Status: Abnormal   Collection Time: 07/21/21  5:04 AM  Result Value Ref Range   WBC 11.0 (H) 4.0 - 10.5 K/uL   RBC 3.13 (L) 4.22 - 5.81 MIL/uL   Hemoglobin 9.6 (L) 13.0 - 17.0 g/dL   HCT 28.8 (L) 39.0 - 52.0 %   MCV 92.0 80.0 - 100.0 fL   MCH 30.7 26.0 - 34.0 pg   MCHC 33.3 30.0 - 36.0 g/dL   RDW 12.6 11.5 - 15.5 %   Platelets 265 150 - 400 K/uL   nRBC 0.0 0.0 - 0.2 %   Neutrophils Relative % 68 %   Neutro Abs 7.5 1.7 - 7.7 K/uL   Lymphocytes Relative 16 %   Lymphs Abs 1.8 0.7 - 4.0 K/uL   Monocytes Relative 7 %   Monocytes Absolute 0.8 0.1 - 1.0 K/uL   Eosinophils Relative 3 %   Eosinophils Absolute 0.4 0.0 - 0.5 K/uL   Basophils Relative 1 %   Basophils Absolute 0.1 0.0 - 0.1 K/uL   Immature Granulocytes 5 %   Abs Immature Granulocytes 0.49 (H) 0.00 - 0.07 K/uL    Comment: Performed at [facility] [address] [place], [place] [address]  Magnesium     Status: None   Collection Time: 07/21/21  2:49 PM  Result Value Ref Range   Magnesium 2.4 1.7 - 2.4 mg/dL    Comment: Performed at [facility], [place] [address] [place], [place] [address]  Phosphorus     Status: Abnormal   Collection Time: 07/21/21  2:49 PM  Result Value Ref Range   Phosphorus 5.7 (H) 2.5 - 4.6 mg/dL    Comment: Performed at [facility] [address] [place], [place] [address]  CBG monitoring, ED     Status: Abnormal   Collection Time: 07/21/21  3:55 PM  Result Value Ref Range   Glucose-Capillary 117 (H) 70 - 99 mg/dL    Comment: Glucose reference range applies only to samples taken after fasting for at least 8 hours.  CBG monitoring, ED     Status: Abnormal   Collection Time: 07/21/21  5:08 PM  Result Value Ref Range   Glucose-Capillary 316 (H) 70 - 99 mg/dL    Comment: Glucose reference range applies only to samples taken after fasting for at least 8 hours.   DG Chest Port 1 View  Result Date: 07/21/2021 CLINICAL DATA:  Provided history:  Shortness  of breath. EXAM: PORTABLE CHEST 1 VIEW COMPARISON:  No pertinent prior exams available for comparison. FINDINGS: Mild cardiomegaly. Aortic atherosclerosis. No appreciable airspace consolidation or pulmonary edema. No evidence of pleural effusion or pneumothorax. No acute bony abnormality identified. IMPRESSION: No evidence of acute cardiopulmonary abnormality. Mild cardiomegaly. Aortic Atherosclerosis (ICD10-I70.0). Electronically Signed   By: Kellie Simmering D.O.   On: 07/21/2021 12:16    Pending Labs Unresulted Labs (From admission, onward)     Start     Ordered   07/22/21 3546  Basic metabolic panel  Tomorrow morning,   R        07/21/21 1416   07/22/21 0500  CBC  Tomorrow morning,   R        07/21/21 1416   07/21/21 1300  Creatinine, urine, random  Once,   URGENT        07/21/21 1259   07/21/21 1259  Sodium, urine, random  Once,   URGENT        07/21/21 1259            Vitals/Pain Today's Vitals   07/21/21 1630 07/21/21 1700 07/21/21 1730 07/21/21 1731  BP: (!) 175/65 (!) 175/69 (!) 169/67   Pulse: (!) 59 61 65   Resp: 18 16 15    Temp:      TempSrc:      SpO2: 98% 97% 98%   Weight:      Height:      PainSc:    0-No pain    Isolation Precautions No active isolations  Medications Medications  aspirin EC tablet 81 mg (81 mg Oral Given 07/21/21 1439)  atorvastatin (LIPITOR) tablet 10 mg (10 mg Oral Given 07/21/21 1439)  cloNIDine (CATAPRES) tablet 0.1 mg (has no administration in time range)  fenofibrate tablet 54 mg (54 mg Oral Not Given 07/21/21 1544)  hydrALAZINE (APRESOLINE) tablet 25 mg (25 mg Oral Given 07/21/21 1439)  escitalopram (LEXAPRO) tablet 10 mg (10 mg Oral Given 07/21/21 1439)  zolpidem (AMBIEN) tablet 10 mg (has no administration in time range)  insulin detemir (LEVEMIR) injection 50 Units (50 Units Subcutaneous Given 07/21/21 1709)  ferrous sulfate tablet 325 mg (325 mg Oral Given 07/21/21 1600)  sodium chloride (OCEAN) 0.65 % nasal spray 1 spray  (has no administration in time range)  insulin aspart (novoLOG) injection 0-6 Units (4 Units Subcutaneous Given 07/21/21 1740)  heparin injection 5,000 Units (5,000 Units Subcutaneous Given 07/21/21 1446)  0.9 %  sodium chloride infusion ( Intravenous New Bag/Given 07/21/21 1445)  acetaminophen (TYLENOL) tablet 650 mg (has no administration in time range)    Or  acetaminophen (TYLENOL) suppository 650 mg (has no administration in time range)  hydrALAZINE (APRESOLINE) injection 10 mg (has no administration in time range)  amLODipine (NORVASC) tablet 10 mg (10 mg Oral Given 07/21/21 1557)  sodium chloride 0.9 % bolus 1,000 mL (0 mLs Intravenous Stopped 07/21/21 1433)  albuterol (VENTOLIN HFA) 108 (90 Base) MCG/ACT inhaler 2 puff (2 puffs Inhalation Given 07/21/21 1244)    Mobility walks with person assist Low fall risk   Focused Assessments Renal Assessment Handoff:  Cr: 5.49  BUN 137  Not currently on dialysis    Recommendations: See Admitting Provider Note  Report given to:   Additional Notes:

## 2021-07-21 NOTE — Assessment & Plan Note (Addendum)
Questionable history of sinusitis; however, he has no sinus pain on palpation, tooth pain or fevers. He has not edema in turbinates.  Discussed antibiotics not recommended and that his CXR was clear. Will hold for now  Continue nasal spray prn.

## 2021-07-21 NOTE — Progress Notes (Signed)
[facility] Progress Note    Assessment/ Plan:     AKI on CKD IV: baseline Cr between 3.8-4.1.  Progression vs hemodynamic hypoperfusion/ pre-renal injury?  BP is high so not sure that fits and UA is bland. - s/p 1L IVFs in the ED   - not uremic, no indications for dialysis  - hold Farxiga, ARB, diuretics  - UA bland  - no NSAIDS, IV contrast, fleets enemas, mag citrate, PPI  2.  HTN  - can continue hydralazine  - has some bradycardia in the high 40s/ low 50s, will replace BB with amlodipine  3.  DM II  - last A1c 8.7 per pt  - per primary  4.  Sinusitis  - per primary  5.  Dispo: obs    Subjective:    Pt is a 42M with a PMH of CKD IV followed by Dr Moshe Cipro, HTN, DM II, and previous episode of AKI on CKD in 08/2020 who presented to ED d/t abnormal labs.  Had regular checkup with PCP yesterday where bloodwork was taken.  Was called and asked to come to ED for abnormal labs with Cr of 4.61 and Bun of  111.    Pt's baseline Cr is between 3.8-4.1.  Last Cr at [facility] was 4.1 with BUN 99 01/2021.  Had labs 03/2021 with Cr of 3.8 and BUN 79.  Pt is taking Farxiga, Lasix, HCTZ, losartan as OP.  Has had a "sinus infection" for the past week.  No NSAIDs, gout.  BP is high.     Objective:   BP (!) 170/86   Pulse (!) 49   Temp 97.9 F (36.6 C) (Oral)   Resp 16   Ht 6' (1.829 m)   Wt 136.1 kg   SpO2 99%   BMI 40.69 kg/m  No intake or output data in the 24 hours ending 07/21/21 1450 Weight change:   Physical Exam: Gen:NAD, sitting in bed CVS: RRR no m/r/g Resp: slight inspiratory wheezing that resolves with coughing Abd: nondistended Ext: trace LE edema bilaterally  Imaging: DG Chest Port 1 View  Result Date: 07/21/2021 CLINICAL DATA:  Provided history: Shortness of breath. EXAM: PORTABLE CHEST 1 VIEW COMPARISON:  No pertinent prior exams available for comparison. FINDINGS: Mild cardiomegaly. Aortic atherosclerosis. No appreciable airspace consolidation or  pulmonary edema. No evidence of pleural effusion or pneumothorax. No acute bony abnormality identified. IMPRESSION: No evidence of acute cardiopulmonary abnormality. Mild cardiomegaly. Aortic Atherosclerosis (ICD10-I70.0). Electronically Signed   By: Kellie Simmering D.O.   On: 07/21/2021 12:16    Labs: BMET Recent Labs  Lab 07/21/21 0504  NA 138  K 4.4  CL 106  CO2 20*  GLUCOSE 78  BUN 137*  CREATININE 5.49*  CALCIUM 8.7*   CBC Recent Labs  Lab 07/21/21 0504  WBC 11.0*  NEUTROABS 7.5  HGB 9.6*  HCT 28.8*  MCV 92.0  PLT 265    Medications:     aspirin EC  81 mg Oral Daily   atorvastatin  10 mg Oral Daily   cloNIDine  0.1 mg Oral BID   escitalopram  10 mg Oral Daily   fenofibrate  54 mg Oral Daily   ferrous sulfate  325 mg Oral BID WC   heparin  5,000 Units Subcutaneous Q8H   hydrALAZINE  25 mg Oral Q8H   insulin aspart  0-6 Units Subcutaneous TID WC   insulin detemir  50 Units Subcutaneous Daily      Madelon Lips MD 07/21/2021,  2:50 PM

## 2021-07-21 NOTE — Assessment & Plan Note (Signed)
Baseline hgb around 8-9 Stable, continue to monitor

## 2021-07-22 ENCOUNTER — Inpatient Hospital Stay (HOSPITAL_COMMUNITY): Payer: BC Managed Care – PPO

## 2021-07-22 DIAGNOSIS — Z794 Long term (current) use of insulin: Secondary | ICD-10-CM | POA: Diagnosis not present

## 2021-07-22 DIAGNOSIS — N1831 Chronic kidney disease, stage 3a: Secondary | ICD-10-CM

## 2021-07-22 DIAGNOSIS — Z6841 Body Mass Index (BMI) 40.0 and over, adult: Secondary | ICD-10-CM | POA: Diagnosis not present

## 2021-07-22 DIAGNOSIS — Z888 Allergy status to other drugs, medicaments and biological substances status: Secondary | ICD-10-CM | POA: Diagnosis not present

## 2021-07-22 DIAGNOSIS — R001 Bradycardia, unspecified: Secondary | ICD-10-CM | POA: Diagnosis present

## 2021-07-22 DIAGNOSIS — G4700 Insomnia, unspecified: Secondary | ICD-10-CM | POA: Diagnosis present

## 2021-07-22 DIAGNOSIS — Z79899 Other long term (current) drug therapy: Secondary | ICD-10-CM | POA: Diagnosis not present

## 2021-07-22 DIAGNOSIS — E785 Hyperlipidemia, unspecified: Secondary | ICD-10-CM | POA: Diagnosis present

## 2021-07-22 DIAGNOSIS — Z91040 Latex allergy status: Secondary | ICD-10-CM | POA: Diagnosis not present

## 2021-07-22 DIAGNOSIS — Z7982 Long term (current) use of aspirin: Secondary | ICD-10-CM | POA: Diagnosis not present

## 2021-07-22 DIAGNOSIS — Z981 Arthrodesis status: Secondary | ICD-10-CM | POA: Diagnosis not present

## 2021-07-22 DIAGNOSIS — N171 Acute kidney failure with acute cortical necrosis: Secondary | ICD-10-CM | POA: Diagnosis not present

## 2021-07-22 DIAGNOSIS — Z807 Family history of other malignant neoplasms of lymphoid, hematopoietic and related tissues: Secondary | ICD-10-CM | POA: Diagnosis not present

## 2021-07-22 DIAGNOSIS — Z833 Family history of diabetes mellitus: Secondary | ICD-10-CM | POA: Diagnosis not present

## 2021-07-22 DIAGNOSIS — I129 Hypertensive chronic kidney disease with stage 1 through stage 4 chronic kidney disease, or unspecified chronic kidney disease: Secondary | ICD-10-CM | POA: Diagnosis present

## 2021-07-22 DIAGNOSIS — D631 Anemia in chronic kidney disease: Secondary | ICD-10-CM | POA: Diagnosis present

## 2021-07-22 DIAGNOSIS — E1122 Type 2 diabetes mellitus with diabetic chronic kidney disease: Secondary | ICD-10-CM | POA: Diagnosis present

## 2021-07-22 DIAGNOSIS — N184 Chronic kidney disease, stage 4 (severe): Secondary | ICD-10-CM | POA: Diagnosis present

## 2021-07-22 DIAGNOSIS — J329 Chronic sinusitis, unspecified: Secondary | ICD-10-CM | POA: Diagnosis present

## 2021-07-22 DIAGNOSIS — N179 Acute kidney failure, unspecified: Secondary | ICD-10-CM | POA: Diagnosis present

## 2021-07-22 LAB — BASIC METABOLIC PANEL
Anion gap: 11 (ref 5–15)
BUN: 121 mg/dL — ABNORMAL HIGH (ref 8–23)
CO2: 17 mmol/L — ABNORMAL LOW (ref 22–32)
Calcium: 8 mg/dL — ABNORMAL LOW (ref 8.9–10.3)
Chloride: 107 mmol/L (ref 98–111)
Creatinine, Ser: 4.81 mg/dL — ABNORMAL HIGH (ref 0.61–1.24)
GFR, Estimated: 12 mL/min — ABNORMAL LOW (ref >60)
Glucose, Bld: 341 mg/dL — ABNORMAL HIGH (ref 70–99)
Potassium: 4.4 mmol/L (ref 3.5–5.1)
Sodium: 135 mmol/L (ref 135–145)

## 2021-07-22 LAB — CBC
HCT: 27.5 % — ABNORMAL LOW (ref 39.0–52.0)
Hemoglobin: 9 g/dL — ABNORMAL LOW (ref 13.0–17.0)
MCH: 30.1 pg (ref 26.0–34.0)
MCHC: 32.7 g/dL (ref 30.0–36.0)
MCV: 92 fL (ref 80.0–100.0)
Platelets: 253 10*3/uL (ref 150–400)
RBC: 2.99 MIL/uL — ABNORMAL LOW (ref 4.22–5.81)
RDW: 12.6 % (ref 11.5–15.5)
WBC: 10.9 10*3/uL — ABNORMAL HIGH (ref 4.0–10.5)
nRBC: 0 % (ref 0.0–0.2)

## 2021-07-22 LAB — GLUCOSE, CAPILLARY
Glucose-Capillary: 166 mg/dL — ABNORMAL HIGH (ref 70–99)
Glucose-Capillary: 173 mg/dL — ABNORMAL HIGH (ref 70–99)
Glucose-Capillary: 227 mg/dL — ABNORMAL HIGH (ref 70–99)
Glucose-Capillary: 251 mg/dL — ABNORMAL HIGH (ref 70–99)
Glucose-Capillary: 398 mg/dL — ABNORMAL HIGH (ref 70–99)

## 2021-07-22 LAB — HEMOGLOBIN A1C
Hgb A1c MFr Bld: 8 % — ABNORMAL HIGH (ref 4.8–5.6)
Mean Plasma Glucose: 182.9 mg/dL

## 2021-07-22 LAB — CREATININE, URINE, RANDOM: Creatinine, Urine: 44.66 mg/dL

## 2021-07-22 LAB — SODIUM, URINE, RANDOM: Sodium, Ur: 56 mmol/L

## 2021-07-22 MED ORDER — SODIUM CHLORIDE 0.9 % IV SOLN: INTRAVENOUS | Status: AC

## 2021-07-22 MED ORDER — INSULIN ASPART 100 UNIT/ML IJ SOLN
0.0000 [IU] | Freq: Three times a day (TID) | INTRAMUSCULAR | Status: DC
  Administered 2021-07-22: 5 [IU] via SUBCUTANEOUS
  Administered 2021-07-22 (×2): 3 [IU] via SUBCUTANEOUS

## 2021-07-22 MED ORDER — INSULIN ASPART 100 UNIT/ML IJ SOLN: 4.0000 [IU] | Freq: Three times a day (TID) | INTRAMUSCULAR | Status: DC

## 2021-07-22 MED ORDER — INSULIN ASPART 100 UNIT/ML IJ SOLN
0.0000 [IU] | Freq: Every day | INTRAMUSCULAR | Status: DC
  Administered 2021-07-22: 2 [IU] via SUBCUTANEOUS

## 2021-07-22 MED ORDER — CLONIDINE HCL 0.1 MG PO TABS
0.1000 mg | ORAL_TABLET | Freq: Three times a day (TID) | ORAL | Status: DC
  Administered 2021-07-22 – 2021-07-23 (×4): 0.1 mg via ORAL
  Filled 2021-07-22 (×4): qty 1

## 2021-07-22 MED ORDER — INSULIN ASPART 100 UNIT/ML IJ SOLN
3.0000 [IU] | Freq: Three times a day (TID) | INTRAMUSCULAR | Status: DC
Start: 2021-07-22 — End: 2021-07-23
  Administered 2021-07-22 – 2021-07-23 (×4): 3 [IU] via INTRAVENOUS

## 2021-07-22 NOTE — Progress Notes (Signed)
PROGRESS NOTE                                                                                                                                                                                                             Patient Demographics:    Timothy Holloway, is a 66 y.o. male, DOB - [birth year], CXK:[id number]  Outpatient Primary [facility] for the [facility] is [facility]    LOS - 0  Admit date - 07/21/2021    Chief Complaint  Patient presents with   abnormal labs       Brief Narrative (HPI from H&P)   66 y.o. male with medical history significant of T2DM, CKD stage 4, HLD, HTN, obesity who presented to ED for abnormal labs at his PCP.  He is asymptomatic.  He has been working and denies any increased leg swelling, difficulty or change in urination or NSAID use.  He went to PCP office where he was found to have AKI and sent to hospital for further care.   Subjective:    Isabelle Course today has, No headache, No chest pain, No abdominal pain - No Nausea, No new weakness tingling or numbness,  no SOB.   Assessment  & Plan :    Assessment and Plan: * Acute renal failure superimposed on stage 4 chronic kidney disease ([place]) 66 year old male presenting to ED with abnormal lab work of his renal fucntion at his PCP office yesterday found to have acute on chronic kidney disease stage 4, baseline creatinine around 3.7 creatinine peaked at 5.5.  Gently hydrated, his diuretic, ARB and Wilder Glade are on hold.  Nephrology following. FeNA is greater than 1 however he reticulocyte use.  We will also order urine nitrogen as well.  Further care per nephrology.  Gentle IV fluids have been given.   HTN (hypertension) - Blood pressure quite elevated, clonidine dose adjusted, continue home dose Norvasc, hydralazine and hydralazine also on board, avoid hypotension.  HLD (hyperlipidemia) - Continue lipitor   Anemia due  to chronic kidney disease - Baseline hgb around 8-9, Stable, continue to monitor   Insomnia - Continue prn ambien   Sinusitis - Questionable history of sinusitis; however, he has no sinus pain on palpation, tooth pain or fevers. He has not edema in turbinates.  Discussed antibiotics not recommended and that  his CXR was clear. Will hold for now  Continue nasal spray prn.   Diabetes mellitus ([place]) 2  - Recent A1C 8.2 suggesting poor outpatient control.  Farxiga on hold.  Continue Lantus, SSI adjusted along with Premeal NovoLog.  CBG (last 3)  Recent Labs    07/21/21 2049 07/22/21 0052 07/22/21 0747  GLUCAP 440* 398* 227*         Condition - Extremely Guarded  Family Communication  :  None present  Code Status :  Full  Consults  :  Renal  PUD Prophylaxis :     Procedures  :           Disposition Plan  :    Status is:  Obs  DVT Prophylaxis  :    Place TED hose Start: 07/22/21 0809 heparin injection 5,000 Units Start: 07/21/21 1430    Lab Results  Component Value Date   PLT 253 07/22/2021    Diet :  Diet Order             Diet renal/carb modified with fluid restriction Diet-HS Snack? Nothing; Fluid restriction: 1200 mL Fluid; Room service appropriate? Yes; Fluid consistency: Thin  Diet effective now                    Inpatient Medications  Scheduled Meds:  amLODipine  10 mg Oral Daily   aspirin EC  81 mg Oral Daily   atorvastatin  10 mg Oral Daily   cloNIDine  0.1 mg Oral TID   escitalopram  10 mg Oral Daily   fenofibrate  54 mg Oral Daily   ferrous sulfate  325 mg Oral BID WC   heparin  5,000 Units Subcutaneous Q8H   hydrALAZINE  25 mg Oral Q8H   insulin aspart  0-15 Units Subcutaneous TID WC   insulin aspart  0-5 Units Subcutaneous QHS   insulin aspart  3 Units Intravenous TID WC   insulin detemir  50 Units Subcutaneous Daily   Continuous Infusions: PRN Meds:.acetaminophen **OR** acetaminophen, hydrALAZINE, sodium chloride,  zolpidem  Antibiotics  :    Anti-infectives (From admission, onward)    None        Time Spent in minutes  30   Lala Lund M.D on 07/22/2021 at 8:49 AM  To page go to [email or web address]   [facility] -  Office  [phone]  See all Orders from today for further details    Objective:   Vitals:   07/21/21 1730 07/22/21 0446 07/22/21 0539 07/22/21 0747  BP: (!) 169/67 (!) 170/70 (!) 162/73 (!) 171/69  Pulse: 65 (!) 59  63  Resp: 15 15  18   Temp:  98 F (36.7 C)  98 F (36.7 C)  TempSrc:  Oral  Oral  SpO2: 98% 96%  97%  Weight:      Height:        Wt Readings from Last 3 Encounters:  07/21/21 136.1 kg  10/14/20 131.8 kg  09/13/20 131.8 kg     Intake/Output Summary (Last 24 hours) at 07/22/2021 0849 Last data filed at 07/22/2021 0620 Gross per 24 hour  Intake 310.12 ml  Output 2100 ml  Net -1789.88 ml     Physical Exam  Awake Alert, No new F.N deficits, Normal affect [place].AT,PERRAL Supple Neck, No JVD,   Symmetrical Chest wall movement, Good air movement bilaterally, CTAB RRR,No Gallops,Rubs or new Murmurs,  +ve B.Sounds, Abd Soft, No tenderness,   No Cyanosis, Clubbing or edema  Data Review:    CBC Recent Labs  Lab 07/21/21 0504 07/22/21 0339  WBC 11.0* 10.9*  HGB 9.6* 9.0*  HCT 28.8* 27.5*  PLT 265 253  MCV 92.0 92.0  MCH 30.7 30.1  MCHC 33.3 32.7  RDW 12.6 12.6  LYMPHSABS 1.8  --   MONOABS 0.8  --   EOSABS 0.4  --   BASOSABS 0.1  --     Electrolytes Recent Labs  Lab 07/21/21 0504 07/21/21 1449 07/22/21 0339  NA 138  --  135  K 4.4  --  4.4  CL 106  --  107  CO2 20*  --  17*  GLUCOSE 78  --  341*  BUN 137*  --  121*  CREATININE 5.49*  --  4.81*  CALCIUM 8.7*  --  8.0*  AST 17  --   --   ALT 18  --   --   ALKPHOS 56  --   --   BILITOT 0.6  --   --   ALBUMIN 3.1*  --   --   MG  --  2.4  --   HGBA1C  --   --  8.0*   Micro Results No results found for this or any previous visit (from the past 240  hour(s)).  Radiology Reports DG Chest Port 1 View  Result Date: 07/21/2021 CLINICAL DATA:  Provided history: Shortness of breath. EXAM: PORTABLE CHEST 1 VIEW COMPARISON:  No pertinent prior exams available for comparison. FINDINGS: Mild cardiomegaly. Aortic atherosclerosis. No appreciable airspace consolidation or pulmonary edema. No evidence of pleural effusion or pneumothorax. No acute bony abnormality identified. IMPRESSION: No evidence of acute cardiopulmonary abnormality. Mild cardiomegaly. Aortic Atherosclerosis (ICD10-I70.0). Electronically Signed   By: Kellie Simmering D.O.   On: 07/21/2021 12:16

## 2021-07-22 NOTE — Progress Notes (Signed)
  Transition of Care [facility] Screening Note   Patient Details  Name: Timothy Holloway Date of Birth: 1955-11-30   Transition of Care [facility]) CM/SW Contact:    Cyndi Bender, RN Phone Number: 07/22/2021, 8:10 AM    Transition of Care Department [facility]) has reviewed patient and no TOC needs have been identified at this time. We will continue to monitor patient advancement through interdisciplinary progression rounds. If new patient transition needs arise, please place a TOC consult.

## 2021-07-22 NOTE — Progress Notes (Signed)
PT Cancellation Note  Patient Details Name: Timothy Holloway MRN: [id number] DOB: February 10, 1955   Cancelled Treatment:    Reason Eval/Treat Not Completed: PT screened, no needs identified, will sign off.  Pt politely declined PT stating he is up walking fine and agreed to let nursing know if this changes.   Ramond Dial 07/22/2021, 12:06 PM  Mee Hives, PT PhD Acute Rehab Dept. Number: [place] [facility] [employer]

## 2021-07-23 ENCOUNTER — Other Ambulatory Visit (HOSPITAL_COMMUNITY): Payer: Self-pay

## 2021-07-23 DIAGNOSIS — N1831 Chronic kidney disease, stage 3a: Secondary | ICD-10-CM | POA: Diagnosis not present

## 2021-07-23 DIAGNOSIS — N179 Acute kidney failure, unspecified: Secondary | ICD-10-CM | POA: Diagnosis not present

## 2021-07-23 LAB — BASIC METABOLIC PANEL
Anion gap: 10 (ref 5–15)
BUN: 112 mg/dL — ABNORMAL HIGH (ref 8–23)
CO2: 18 mmol/L — ABNORMAL LOW (ref 22–32)
Calcium: 8.3 mg/dL — ABNORMAL LOW (ref 8.9–10.3)
Chloride: 111 mmol/L (ref 98–111)
Creatinine, Ser: 4.45 mg/dL — ABNORMAL HIGH (ref 0.61–1.24)
GFR, Estimated: 14 mL/min — ABNORMAL LOW (ref >60)
Glucose, Bld: 117 mg/dL — ABNORMAL HIGH (ref 70–99)
Potassium: 4.1 mmol/L (ref 3.5–5.1)
Sodium: 139 mmol/L (ref 135–145)

## 2021-07-23 LAB — CBC WITH DIFFERENTIAL/PLATELET
Abs Immature Granulocytes: 0.25 10*3/uL — ABNORMAL HIGH (ref 0.00–0.07)
Basophils Absolute: 0 10*3/uL (ref 0.0–0.1)
Basophils Relative: 0 %
Eosinophils Absolute: 0.3 10*3/uL (ref 0.0–0.5)
Eosinophils Relative: 3 %
HCT: 25.8 % — ABNORMAL LOW (ref 39.0–52.0)
Hemoglobin: 8.4 g/dL — ABNORMAL LOW (ref 13.0–17.0)
Immature Granulocytes: 2 %
Lymphocytes Relative: 15 %
Lymphs Abs: 1.7 10*3/uL (ref 0.7–4.0)
MCH: 30.2 pg (ref 26.0–34.0)
MCHC: 32.6 g/dL (ref 30.0–36.0)
MCV: 92.8 fL (ref 80.0–100.0)
Monocytes Absolute: 0.9 10*3/uL (ref 0.1–1.0)
Monocytes Relative: 9 %
Neutro Abs: 7.8 10*3/uL — ABNORMAL HIGH (ref 1.7–7.7)
Neutrophils Relative %: 71 %
Platelets: 250 10*3/uL (ref 150–400)
RBC: 2.78 MIL/uL — ABNORMAL LOW (ref 4.22–5.81)
RDW: 12.8 % (ref 11.5–15.5)
WBC: 11 10*3/uL — ABNORMAL HIGH (ref 4.0–10.5)
nRBC: 0 % (ref 0.0–0.2)

## 2021-07-23 LAB — GLUCOSE, CAPILLARY: Glucose-Capillary: 105 mg/dL — ABNORMAL HIGH (ref 70–99)

## 2021-07-23 LAB — BRAIN NATRIURETIC PEPTIDE: B Natriuretic Peptide: 65 pg/mL (ref 0.0–100.0)

## 2021-07-23 LAB — MAGNESIUM: Magnesium: 2.2 mg/dL (ref 1.7–2.4)

## 2021-07-23 LAB — UREA NITROGEN, URINE: Urea Nitrogen, Ur: 544 mg/dL

## 2021-07-23 MED ORDER — HYDRALAZINE HCL 50 MG PO TABS: 50.0000 mg | ORAL_TABLET | Freq: Three times a day (TID) | ORAL | Status: DC

## 2021-07-23 MED ORDER — AMLODIPINE BESYLATE 10 MG PO TABS
10.0000 mg | ORAL_TABLET | Freq: Every day | ORAL | 0 refills | Status: DC
  Filled 2021-07-23: qty 30, 30d supply, fill #0

## 2021-07-23 MED ORDER — ORAL CARE MOUTH RINSE: 15.0000 mL | OROMUCOSAL | Status: DC | PRN

## 2021-07-23 MED ORDER — SODIUM BICARBONATE 650 MG PO TABS: 650.0000 mg | ORAL_TABLET | Freq: Two times a day (BID) | ORAL | Status: DC

## 2021-07-23 MED ORDER — SODIUM BICARBONATE 650 MG PO TABS
650.0000 mg | ORAL_TABLET | Freq: Two times a day (BID) | ORAL | 0 refills | Status: DC
  Filled 2021-07-23: qty 60, 30d supply, fill #0

## 2021-07-23 MED ORDER — HYDRALAZINE HCL 50 MG PO TABS
50.0000 mg | ORAL_TABLET | Freq: Three times a day (TID) | ORAL | 0 refills | Status: DC
  Filled 2021-07-23: qty 90, 30d supply, fill #0

## 2021-09-08 ENCOUNTER — Ambulatory Visit (HOSPITAL_COMMUNITY)
Admission: RE | Admit: 2021-09-08 | Discharge: 2021-09-08 | Disposition: A | Discharge status: Home / Self Care | Payer: BC Managed Care – PPO | Source: Ambulatory Visit | Attending: Nephrology | Admitting: Nephrology

## 2021-09-08 VITALS — BP 183/67 | HR 73 | Temp 97.4°F | Resp 19

## 2021-09-08 DIAGNOSIS — D631 Anemia in chronic kidney disease: Secondary | ICD-10-CM | POA: Insufficient documentation

## 2021-09-08 DIAGNOSIS — N184 Chronic kidney disease, stage 4 (severe): Secondary | ICD-10-CM | POA: Insufficient documentation

## 2021-09-08 LAB — POCT HEMOGLOBIN-HEMACUE: Hemoglobin: 8.8 g/dL — ABNORMAL LOW (ref 13.0–17.0)

## 2021-09-08 MED ORDER — EPOETIN ALFA-EPBX 40000 UNIT/ML IJ SOLN: 20000.0000 [IU] | INTRAMUSCULAR | Status: DC

## 2021-09-08 MED ORDER — EPOETIN ALFA 20000 UNIT/ML IJ SOLN: 20000.0000 [IU] | INTRAMUSCULAR | Status: DC

## 2021-09-08 MED ORDER — EPOETIN ALFA 20000 UNIT/ML IJ SOLN
INTRAMUSCULAR | Status: AC
  Administered 2021-09-08: 20000 [IU] via SUBCUTANEOUS
  Filled 2021-09-08: qty 1

## 2021-10-06 ENCOUNTER — Ambulatory Visit (HOSPITAL_COMMUNITY)
Admission: RE | Admit: 2021-10-06 | Discharge: 2021-10-06 | Disposition: A | Discharge status: Home / Self Care | Payer: BC Managed Care – PPO | Source: Ambulatory Visit | Attending: Nephrology | Admitting: Nephrology

## 2021-10-06 VITALS — BP 153/76 | HR 78 | Temp 98.2°F

## 2021-10-06 DIAGNOSIS — D631 Anemia in chronic kidney disease: Secondary | ICD-10-CM | POA: Diagnosis present

## 2021-10-06 DIAGNOSIS — N184 Chronic kidney disease, stage 4 (severe): Secondary | ICD-10-CM | POA: Diagnosis present

## 2021-10-06 LAB — FERRITIN: Ferritin: 857 ng/mL — ABNORMAL HIGH (ref 24–336)

## 2021-10-06 LAB — IRON AND TIBC
Iron: 57 ug/dL (ref 45–182)
Saturation Ratios: 18 % (ref 17.9–39.5)
TIBC: 309 ug/dL (ref 250–450)
UIBC: 252 ug/dL

## 2021-10-06 MED ORDER — EPOETIN ALFA-EPBX 10000 UNIT/ML IJ SOLN
20000.0000 [IU] | INTRAMUSCULAR | Status: DC
  Administered 2021-10-06: 20000 [IU] via SUBCUTANEOUS

## 2021-10-06 MED ORDER — EPOETIN ALFA-EPBX 10000 UNIT/ML IJ SOLN
INTRAMUSCULAR | Status: AC
  Filled 2021-10-06: qty 2

## 2021-10-14 LAB — POCT HEMOGLOBIN-HEMACUE: Hemoglobin: 8.3 g/dL — ABNORMAL LOW (ref 13.0–17.0)

## 2021-11-02 ENCOUNTER — Other Ambulatory Visit (HOSPITAL_COMMUNITY): Payer: Self-pay | Admitting: *Deleted

## 2021-11-03 ENCOUNTER — Encounter (HOSPITAL_COMMUNITY)
Admission: RE | Admit: 2021-11-03 | Discharge: 2021-11-03 | Disposition: A | Discharge status: Home / Self Care | Payer: BC Managed Care – PPO | Source: Ambulatory Visit | Attending: Nephrology | Admitting: Nephrology

## 2021-11-03 ENCOUNTER — Encounter (HOSPITAL_COMMUNITY): Payer: BC Managed Care – PPO

## 2021-11-03 VITALS — BP 170/70 | HR 73 | Temp 97.3°F | Resp 20

## 2021-11-03 DIAGNOSIS — N184 Chronic kidney disease, stage 4 (severe): Secondary | ICD-10-CM | POA: Diagnosis present

## 2021-11-03 DIAGNOSIS — D631 Anemia in chronic kidney disease: Secondary | ICD-10-CM | POA: Insufficient documentation

## 2021-11-03 LAB — RENAL FUNCTION PANEL
Albumin: 3.2 g/dL — ABNORMAL LOW (ref 3.5–5.0)
Anion gap: 9 (ref 5–15)
BUN: 88 mg/dL — ABNORMAL HIGH (ref 8–23)
CO2: 20 mmol/L — ABNORMAL LOW (ref 22–32)
Calcium: 8.5 mg/dL — ABNORMAL LOW (ref 8.9–10.3)
Chloride: 110 mmol/L (ref 98–111)
Creatinine, Ser: 5.03 mg/dL — ABNORMAL HIGH (ref 0.61–1.24)
GFR, Estimated: 12 mL/min — ABNORMAL LOW (ref >60)
Glucose, Bld: 152 mg/dL — ABNORMAL HIGH (ref 70–99)
Phosphorus: 6 mg/dL — ABNORMAL HIGH (ref 2.5–4.6)
Potassium: 5 mmol/L (ref 3.5–5.1)
Sodium: 139 mmol/L (ref 135–145)

## 2021-11-03 LAB — IRON AND TIBC
Iron: 59 ug/dL (ref 45–182)
Saturation Ratios: 17 % — ABNORMAL LOW (ref 17.9–39.5)
TIBC: 340 ug/dL (ref 250–450)
UIBC: 281 ug/dL

## 2021-11-03 LAB — FERRITIN: Ferritin: 714 ng/mL — ABNORMAL HIGH (ref 24–336)

## 2021-11-03 LAB — POCT HEMOGLOBIN-HEMACUE: Hemoglobin: 9.2 g/dL — ABNORMAL LOW (ref 13.0–17.0)

## 2021-11-03 MED ORDER — EPOETIN ALFA-EPBX 10000 UNIT/ML IJ SOLN
INTRAMUSCULAR | Status: AC
  Filled 2021-11-03: qty 2

## 2021-11-03 MED ORDER — EPOETIN ALFA-EPBX 10000 UNIT/ML IJ SOLN
20000.0000 [IU] | INTRAMUSCULAR | Status: DC
  Administered 2021-11-03: 20000 [IU] via SUBCUTANEOUS

## 2021-11-04 LAB — PTH, INTACT AND CALCIUM
Calcium, Total (PTH): 8.7 mg/dL (ref 8.6–10.2)
PTH: 154 pg/mL — ABNORMAL HIGH (ref 15–65)

## 2021-11-05 ENCOUNTER — Encounter (HOSPITAL_COMMUNITY): Payer: BC Managed Care – PPO

## 2021-11-12 ENCOUNTER — Telehealth (HOSPITAL_COMMUNITY): Payer: Self-pay

## 2021-11-12 NOTE — Telephone Encounter (Signed)
Called pt to schedule with Dr Nehemiah Settle, wife answered and will have him call back

## 2021-11-17 ENCOUNTER — Encounter (HOSPITAL_COMMUNITY): Payer: BC Managed Care – PPO

## 2021-11-19 ENCOUNTER — Encounter (HOSPITAL_COMMUNITY)
Admission: RE | Admit: 2021-11-19 | Discharge: 2021-11-19 | Disposition: A | Discharge status: Home / Self Care | Payer: BC Managed Care – PPO | Source: Ambulatory Visit | Attending: Nephrology | Admitting: Nephrology

## 2021-11-19 VITALS — BP 159/57 | HR 54 | Temp 96.8°F

## 2021-11-19 DIAGNOSIS — D631 Anemia in chronic kidney disease: Secondary | ICD-10-CM

## 2021-11-19 DIAGNOSIS — N184 Chronic kidney disease, stage 4 (severe): Secondary | ICD-10-CM | POA: Diagnosis not present

## 2021-11-19 LAB — POCT HEMOGLOBIN-HEMACUE: Hemoglobin: 9.2 g/dL — ABNORMAL LOW (ref 13.0–17.0)

## 2021-11-19 MED ORDER — EPOETIN ALFA-EPBX 10000 UNIT/ML IJ SOLN
20000.0000 [IU] | INTRAMUSCULAR | Status: DC
  Administered 2021-11-19: 20000 [IU] via SUBCUTANEOUS

## 2021-11-19 MED ORDER — EPOETIN ALFA-EPBX 10000 UNIT/ML IJ SOLN
INTRAMUSCULAR | Status: AC
  Filled 2021-11-19: qty 2

## 2021-11-19 MED ORDER — SODIUM CHLORIDE 0.9 % IV SOLN
510.0000 mg | INTRAVENOUS | Status: DC
  Administered 2021-11-19: 510 mg via INTRAVENOUS
  Filled 2021-11-19: qty 510

## 2021-12-03 ENCOUNTER — Encounter (HOSPITAL_COMMUNITY)
Admission: RE | Admit: 2021-12-03 | Discharge: 2021-12-03 | Disposition: A | Discharge status: Home / Self Care | Payer: BC Managed Care – PPO | Source: Ambulatory Visit | Attending: Nephrology | Admitting: Nephrology

## 2021-12-03 VITALS — BP 165/74 | HR 62 | Temp 98.0°F | Resp 18

## 2021-12-03 DIAGNOSIS — N184 Chronic kidney disease, stage 4 (severe): Secondary | ICD-10-CM | POA: Insufficient documentation

## 2021-12-03 DIAGNOSIS — D631 Anemia in chronic kidney disease: Secondary | ICD-10-CM | POA: Diagnosis present

## 2021-12-03 LAB — POCT HEMOGLOBIN-HEMACUE: Hemoglobin: 8.5 g/dL — ABNORMAL LOW (ref 13.0–17.0)

## 2021-12-03 MED ORDER — SODIUM CHLORIDE 0.9 % IV SOLN
510.0000 mg | INTRAVENOUS | Status: DC
  Administered 2021-12-03: 510 mg via INTRAVENOUS
  Filled 2021-12-03: qty 510

## 2021-12-03 MED ORDER — EPOETIN ALFA-EPBX 10000 UNIT/ML IJ SOLN: 20000.0000 [IU] | INTRAMUSCULAR | Status: DC

## 2021-12-03 MED ORDER — EPOETIN ALFA-EPBX 10000 UNIT/ML IJ SOLN
INTRAMUSCULAR | Status: AC
  Administered 2021-12-03: 20000 [IU] via SUBCUTANEOUS
  Filled 2021-12-03: qty 2

## 2021-12-17 ENCOUNTER — Ambulatory Visit (HOSPITAL_COMMUNITY)
Admission: RE | Admit: 2021-12-17 | Discharge: 2021-12-17 | Disposition: A | Discharge status: Home / Self Care | Payer: BC Managed Care – PPO | Source: Ambulatory Visit | Attending: Nephrology | Admitting: Nephrology

## 2021-12-17 VITALS — BP 146/67 | HR 57 | Temp 97.9°F | Resp 18

## 2021-12-17 DIAGNOSIS — N184 Chronic kidney disease, stage 4 (severe): Secondary | ICD-10-CM | POA: Diagnosis present

## 2021-12-17 DIAGNOSIS — D631 Anemia in chronic kidney disease: Secondary | ICD-10-CM | POA: Insufficient documentation

## 2021-12-17 LAB — IRON AND TIBC
Iron: 46 ug/dL (ref 45–182)
Saturation Ratios: 14 % — ABNORMAL LOW (ref 17.9–39.5)
TIBC: 323 ug/dL (ref 250–450)
UIBC: 277 ug/dL

## 2021-12-17 LAB — RENAL FUNCTION PANEL
Albumin: 3.1 g/dL — ABNORMAL LOW (ref 3.5–5.0)
Anion gap: 10 (ref 5–15)
BUN: 77 mg/dL — ABNORMAL HIGH (ref 8–23)
CO2: 23 mmol/L (ref 22–32)
Calcium: 8.3 mg/dL — ABNORMAL LOW (ref 8.9–10.3)
Chloride: 106 mmol/L (ref 98–111)
Creatinine, Ser: 4.48 mg/dL — ABNORMAL HIGH (ref 0.61–1.24)
GFR, Estimated: 14 mL/min — ABNORMAL LOW (ref >60)
Glucose, Bld: 219 mg/dL — ABNORMAL HIGH (ref 70–99)
Phosphorus: 6.1 mg/dL — ABNORMAL HIGH (ref 2.5–4.6)
Potassium: 4.2 mmol/L (ref 3.5–5.1)
Sodium: 139 mmol/L (ref 135–145)

## 2021-12-17 LAB — FERRITIN: Ferritin: 872 ng/mL — ABNORMAL HIGH (ref 24–336)

## 2021-12-17 LAB — POCT HEMOGLOBIN-HEMACUE: Hemoglobin: 9.2 g/dL — ABNORMAL LOW (ref 13.0–17.0)

## 2021-12-17 MED ORDER — EPOETIN ALFA-EPBX 40000 UNIT/ML IJ SOLN
INTRAMUSCULAR | Status: AC
  Filled 2021-12-17: qty 1

## 2021-12-17 MED ORDER — EPOETIN ALFA-EPBX 10000 UNIT/ML IJ SOLN
20000.0000 [IU] | INTRAMUSCULAR | Status: DC
  Administered 2021-12-17: 20000 [IU] via SUBCUTANEOUS

## 2021-12-19 LAB — PTH, INTACT AND CALCIUM
Calcium, Total (PTH): 8.1 mg/dL — ABNORMAL LOW (ref 8.6–10.2)
PTH: 145 pg/mL — ABNORMAL HIGH (ref 15–65)

## 2021-12-31 ENCOUNTER — Encounter (HOSPITAL_COMMUNITY)
Admission: RE | Admit: 2021-12-31 | Discharge: 2021-12-31 | Disposition: A | Discharge status: Home / Self Care | Payer: BC Managed Care – PPO | Source: Ambulatory Visit | Attending: Nephrology | Admitting: Nephrology

## 2021-12-31 ENCOUNTER — Other Ambulatory Visit: Payer: Self-pay | Admitting: *Deleted

## 2021-12-31 VITALS — BP 173/62 | HR 55 | Temp 98.0°F | Resp 18

## 2021-12-31 DIAGNOSIS — D631 Anemia in chronic kidney disease: Secondary | ICD-10-CM | POA: Diagnosis present

## 2021-12-31 DIAGNOSIS — N184 Chronic kidney disease, stage 4 (severe): Secondary | ICD-10-CM | POA: Diagnosis not present

## 2021-12-31 LAB — POCT HEMOGLOBIN-HEMACUE: Hemoglobin: 9.5 g/dL — ABNORMAL LOW (ref 13.0–17.0)

## 2021-12-31 MED ORDER — EPOETIN ALFA-EPBX 10000 UNIT/ML IJ SOLN
INTRAMUSCULAR | Status: AC
  Filled 2021-12-31: qty 2

## 2021-12-31 MED ORDER — EPOETIN ALFA-EPBX 10000 UNIT/ML IJ SOLN
20000.0000 [IU] | INTRAMUSCULAR | Status: DC
  Administered 2021-12-31: 20000 [IU] via SUBCUTANEOUS

## 2022-01-07 ENCOUNTER — Other Ambulatory Visit: Payer: Self-pay

## 2022-01-07 ENCOUNTER — Ambulatory Visit (INDEPENDENT_AMBULATORY_CARE_PROVIDER_SITE_OTHER): Payer: BC Managed Care – PPO | Admitting: Vascular Surgery

## 2022-01-07 ENCOUNTER — Encounter: Payer: Self-pay | Admitting: Vascular Surgery

## 2022-01-07 ENCOUNTER — Ambulatory Visit (INDEPENDENT_AMBULATORY_CARE_PROVIDER_SITE_OTHER)
Admission: RE | Admit: 2022-01-07 | Discharge: 2022-01-07 | Disposition: A | Discharge status: Home / Self Care | Payer: BC Managed Care – PPO | Source: Ambulatory Visit | Attending: Vascular Surgery | Admitting: Vascular Surgery

## 2022-01-07 ENCOUNTER — Ambulatory Visit (HOSPITAL_COMMUNITY)
Admission: RE | Admit: 2022-01-07 | Discharge: 2022-01-07 | Disposition: A | Discharge status: Home / Self Care | Payer: BC Managed Care – PPO | Source: Ambulatory Visit | Attending: Vascular Surgery | Admitting: Vascular Surgery

## 2022-01-07 VITALS — BP 169/81 | HR 61 | Temp 98.0°F | Resp 20 | Ht 72.0 in | Wt 295.0 lb

## 2022-01-07 DIAGNOSIS — N184 Chronic kidney disease, stage 4 (severe): Secondary | ICD-10-CM | POA: Diagnosis present

## 2022-01-07 DIAGNOSIS — N179 Acute kidney failure, unspecified: Secondary | ICD-10-CM

## 2022-01-07 NOTE — Progress Notes (Signed)
Office Note     CC:  ESRD Requesting Provider:  Corliss Parish, MD  HPI: Timothy Holloway is a Right handed 66 y.o. (Jul 06, 1955) male with kidney disease who presents at the request of Corliss Parish, MD for permanent HD access. The patient has had no prior access procedures.   On exam, Timothy Holloway was doing well.  Originally from [place], he now lives in [place].  He continues to work full-time at [employer] working 12-hour shifts.  Creatinine has continued to decline.  Currently CKD 4.  Timothy Holloway denies previous upper extremity surgeries.   The pt is  on a statin for cholesterol management.  The pt is  on a daily aspirin.   Other AC:  - The pt is on medications for hypertension.   The pt is  diabetic. Tobacco hx:  -  Past Medical History:  Diagnosis Date   Cataract    Chronic kidney disease (CKD), stage III (moderate) ([place])    dr Buddy Duty   Diabetes mellitus without complication ([place])    Hyperlipidemia    Hypertension    Impotence    Insomnia    Obesity     Past Surgical History:  Procedure Laterality Date   KNEE SURGERY Left 01-31-2002   SPINE SURGERY     fusion of L4-L5    Social History   Socioeconomic History   Marital status: Married    Spouse name: Not on file   Number of children: Not on file   Years of education: Not on file   Highest education level: Not on file  Occupational History   Not on file  Tobacco Use   Smoking status: Never   Smokeless tobacco: Not on file  Substance and Sexual Activity   Alcohol use: No   Drug use: No   Sexual activity: Yes    Comment: not much  Other Topics Concern   Not on file  Social History Narrative   Not on file   Social Determinants of Health   Financial Resource Strain: Not on file  Food Insecurity: Not on file  Transportation Needs: Not on file  Physical Activity: Not on file  Stress: Not on file  Social Connections: Not on file  Intimate Partner Violence: Not on file   Family History  Problem  Relation Age of Onset   Diabetes Mother    Cancer Father        lymphoma    Current Outpatient Medications  Medication Sig Dispense Refill   acetaminophen (TYLENOL) 325 MG tablet Take 650 mg by mouth every 6 (six) hours as needed for moderate pain.     aspirin 81 MG tablet Take 81 mg by mouth daily.     atorvastatin (LIPITOR) 10 MG tablet Take 1 tablet (10 mg total) by mouth daily. 30 tablet 11   betamethasone dipropionate 0.05 % cream Apply 1 Application topically 2 (two) times daily.     carvedilol (COREG) 3.125 MG tablet Take 1 tablet (3.125 mg total) by mouth 2 (two) times daily with a meal. 60 tablet 0   Cholecalciferol (VITAMIN D) 2000 UNITS CAPS Take 2,000 Units by mouth daily.     cloNIDine (CATAPRES) 0.1 MG tablet Take 0.1 mg by mouth 2 (two) times daily.     co-enzyme Q-10 30 MG capsule Take 30 mg by mouth daily.     diclofenac Sodium (VOLTAREN) 1 % GEL Apply 2 g topically daily as needed (pain).     Dulaglutide (TRULICITY) 1.5 ZO/1.0RU SOPN Inject 1.5 mg  into the skin once a week. Mondays     EPINEPHrine 0.3 mg/0.3 mL IJ SOAJ injection Inject 0.3 mg into the muscle once as needed for anaphylaxis.     escitalopram (LEXAPRO) 10 MG tablet Take 10 mg by mouth daily.     fenofibrate (TRICOR) 145 MG tablet Take 1/2 tablet daily (Patient taking differently: Take 72.5 mg by mouth daily.) 30 tablet 0   ferrous sulfate 325 (65 FE) MG tablet Take 325 mg by mouth 2 (two) times daily with a meal.     furosemide (LASIX) 80 MG tablet Take 80 mg by mouth daily.     Glucosamine-Chondroit-Vit C-Mn (GLUCOSAMINE CHONDR 1500 COMPLX) CAPS Take 1 capsule by mouth 2 (two) times daily.     HUMALOG KWIKPEN 200 UNIT/ML KwikPen Inject 0-30 Units into the skin with breakfast, with lunch, and with evening meal.     hydrALAZINE (APRESOLINE) 50 MG tablet Take 1 tablet (50 mg total) by mouth every 8 (eight) hours. 90 tablet 0   hydrochlorothiazide (HYDRODIURIL) 25 MG tablet Take 25 mg by mouth daily.      insulin detemir (LEVEMIR FLEXTOUCH) 100 UNIT/ML FlexPen Inject 50 Units into the skin daily. (Patient taking differently: Inject 80 Units into the skin at bedtime.) 15 mL 11   losartan (COZAAR) 100 MG tablet Take 1 tablet by mouth daily.     meclizine (ANTIVERT) 25 MG tablet Take 25 mg by mouth daily as needed for dizziness.     methocarbamol (ROBAXIN) 500 MG tablet Take 1 tablet by mouth daily as needed for muscle spasms.     Multiple Vitamin (MULTIVITAMIN) tablet Take 1 tablet by mouth daily.     sertraline (ZOLOFT) 50 MG tablet Take 1 tablet by mouth daily.     sodium bicarbonate 650 MG tablet Take 1 tablet (650 mg total) by mouth 2 (two) times daily. 60 tablet 0   sodium chloride (OCEAN) 0.65 % SOLN nasal spray Place 1 spray into both nostrils daily as needed for congestion.     zolpidem (AMBIEN) 10 MG tablet Take 1 tablet (10 mg total) by mouth at bedtime as needed for sleep. 30 tablet 2   amLODipine (NORVASC) 10 MG tablet Take 1 tablet (10 mg total) by mouth daily. (Patient not taking: Reported on 01/07/2022) 30 tablet 0   FARXIGA 10 MG TABS tablet Take 10 mg by mouth daily.     isosorbide mononitrate (IMDUR) 30 MG 24 hr tablet Take 30 mg by mouth daily.     No current facility-administered medications for this visit.    Allergies  Allergen Reactions   Latex Hives   Lodine [Etodolac] Nausea And Vomiting     REVIEW OF SYSTEMS:   [X]  denotes positive finding, [ ]  denotes negative finding Cardiac  Comments:  Chest pain or chest pressure:    Shortness of breath upon exertion:    Short of breath when lying flat:    Irregular heart rhythm:        Vascular    Pain in calf, thigh, or hip brought on by ambulation:    Pain in feet at night that wakes you up from your sleep:     Blood clot in your veins:    Leg swelling:         Pulmonary    Oxygen at home:    Productive cough:     Wheezing:         Neurologic    Sudden weakness in arms or legs:  Sudden numbness in arms or  legs:     Sudden onset of difficulty speaking or slurred speech:    Temporary loss of vision in one eye:     Problems with dizziness:         Gastrointestinal    Blood in stool:     Vomited blood:         Genitourinary    Burning when urinating:     Blood in urine:        Psychiatric    Major depression:         Hematologic    Bleeding problems:    Problems with blood clotting too easily:        Skin    Rashes or ulcers:        Constitutional    Fever or chills:      PHYSICAL EXAMINATION:  Vitals:   01/07/22 0935  BP: (!) 169/81  Pulse: 61  Resp: 20  Temp: 98 F (36.7 C)  SpO2: 98%  Weight: 295 lb (133.8 kg)  Height: 6' (1.829 m)    General:  WDWN in NAD; vital signs documented above Gait: Not observed HENT: WNL, normocephalic Pulmonary: normal non-labored breathing , without Rales, rhonchi,  wheezing Cardiac: regular HR Abdomen: soft, NT, no masses Skin: without rashes Vascular Exam/Pulses:  Right Left  Radial 2+ (normal) 2+ (normal)                       Extremities: without ischemic changes, without Gangrene , without cellulitis; without open wounds;  Musculoskeletal: no muscle wasting or atrophy  Neurologic: A&O X 3;  No focal weakness or paresthesias are detected Psychiatric:  The pt has Normal affect.   Non-Invasive Vascular Imaging:     +-----------------+-------------+----------+---------+  Right Cephalic   Diameter (cm)Depth (cm)Findings   +-----------------+-------------+----------+---------+  Shoulder            0.30                          +-----------------+-------------+----------+---------+  Prox upper arm       0.35               branching  +-----------------+-------------+----------+---------+  Mid upper arm        0.35               branching  +-----------------+-------------+----------+---------+  Dist upper arm    0.36 / .040           branching   +-----------------+-------------+----------+---------+  Antecubital fossa    0.39                          +-----------------+-------------+----------+---------+  Prox forearm      0.33 / 0.30                      +-----------------+-------------+----------+---------+  Mid forearm          0.29                          +-----------------+-------------+----------+---------+  Dist forearm         0.28                          +-----------------+-------------+----------+---------+   +-----------------+-------------+----------+---------+  Right Basilic    Diameter (cm)Depth (cm)Findings   +-----------------+-------------+----------+---------+  Prox upper  arm       0.55               branching  +-----------------+-------------+----------+---------+  Mid upper arm        0.32                          +-----------------+-------------+----------+---------+  Dist upper arm    0.34 / 0.24           branching  +-----------------+-------------+----------+---------+  Antecubital fossa    0.27                          +-----------------+-------------+----------+---------+  Prox forearm         0.29                          +-----------------+-------------+----------+---------+   +-----------------+-------------+----------+---------+  Left Cephalic    Diameter (cm)Depth (cm)Findings   +-----------------+-------------+----------+---------+  Shoulder            0.46                          +-----------------+-------------+----------+---------+  Prox upper arm       0.38               branching  +-----------------+-------------+----------+---------+  Mid upper arm        0.38               branching  +-----------------+-------------+----------+---------+  Dist upper arm       0.44                          +-----------------+-------------+----------+---------+  Antecubital fossa    0.39                           +-----------------+-------------+----------+---------+  Prox forearm      0.35 / 0.41                      +-----------------+-------------+----------+---------+  Mid forearm          0.32                          +-----------------+-------------+----------+---------+  Dist forearm         0.29                          +-----------------+-------------+----------+---------+   +-----------------+----------------+----------+---------+  Left Basilic      Diameter (cm)  Depth (cm)Findings   +-----------------+----------------+----------+---------+  Prox upper arm   0.32 / 0.36 0.35                     +-----------------+----------------+----------+---------+  Mid upper arm          0.36                branching  +-----------------+----------------+----------+---------+  Dist upper arm         0.40                           +-----------------+----------------+----------+---------+  Antecubital fossa      0.22                           +-----------------+----------------+----------+---------+  Prox forearm           0.26                           +-----------------+----------------+----------+---------+     ASSESSMENT/PLAN:  Timothy Holloway is a 66 y.o. male who presents with chronic kidney disease stage 4  Based on vein mapping and examination, parents would be best treated with left-sided brachiocephalic fistula creation. I had an extensive discussion with this patient in regards to the nature of access surgery, including risk, benefits, and alternatives.   The patient is aware that the risks of access surgery include but are not limited to: bleeding, infection, steal syndrome, nerve damage, ischemic monomelic neuropathy, failure of access to mature, complications related to venous hypertension, and possible need for additional access procedures in the future. I discussed with the patient the nature of the staged access procedure,  specifically the need for a second operation to transpose the first stage fistula if it matures adequately.   The patient has agreed to proceed with the above procedure which will be scheduled for next Tuesday.  [name], MD [facility] [phone] Total time of patient care including pre-visit research, consultation, and documentation greater than 30 minutes

## 2022-01-10 NOTE — Anesthesia Preprocedure Evaluation (Addendum)
Anesthesia Evaluation  Patient identified by MRN, date of birth, ID band Patient awake    Reviewed: Allergy & Precautions, NPO status , Patient's Chart, lab work & pertinent test results  History of Anesthesia Complications Negative for: history of anesthetic complications  Airway Mallampati: III  TM Distance: >3 FB Neck ROM: Full    Dental  (+) Teeth Intact, Dental Advisory Given   Pulmonary neg pulmonary ROS   Pulmonary exam normal breath sounds clear to auscultation       Cardiovascular hypertension (amlodipine, carvedilol, clonidine, HCTZ, losartan, ISMN), Pt. on medications and Pt. 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), Type 2, Insulin Dependent  Morbid obesity  Renal/GU CRFRenal disease     Musculoskeletal   Abdominal  (+) + obese  Peds  Hematology  (+) Blood dyscrasia (anemia of chronic disease), anemia   Anesthesia Other Findings Last Trulicity: 10/03/8180  Reproductive/Obstetrics                             Anesthesia Physical Anesthesia Plan  ASA: 3  Anesthesia Plan: Regional and MAC   Post-op Pain Management: Regional block*   Induction: Intravenous  PONV Risk Score and Plan: 1 and Ondansetron and Treatment may vary due to age or medical condition  Airway Management Planned: Natural Airway and Simple Face Mask  Additional Equipment:   Intra-op Plan:   Post-operative Plan:   Informed Consent: I have reviewed the patients History and Physical, chart, labs and discussed the procedure including the risks, benefits and  alternatives for the proposed anesthesia with the patient or authorized representative who has indicated his/her understanding and acceptance.     Dental advisory given  Plan Discussed with:   Anesthesia Plan Comments: (TTE 04/24/2015 (copy on chart): Left ventricle is normal in size.  There is mild concentric left ventricular hypertrophy with normal wall motion and ejection fraction 60 to 65%. The left ventricular diastolic function is abnormal. The left atrium is mildly dilated. There is mild (1+) tricuspid regurgitation. The aortic valve is trileaflet. There is mild aortic valve thickening with no regurgitation.  Discussed potential risks of nerve blocks including, but not limited to, infection, bleeding, nerve damage, seizures, pneumothorax, respiratory depression, and potential failure of the block. Alternatives to nerve blocks discussed. All questions answered.  Discussed with patient risks of MAC including, but not limited to, minor pain or discomfort, hearing people in the room, and possible need for backup general anesthesia. Risks for general anesthesia also discussed including, but not limited to, sore throat, hoarse voice, chipped/damaged teeth, injury to vocal cords, nausea and vomiting, allergic reactions, lung infection, heart attack, stroke, and death. All questions answered. )        Anesthesia Quick Evaluation

## 2022-01-10 NOTE — Progress Notes (Signed)
Pre-op call, instructions only, see instructions below. Spoke with pt's wife, Loletha Carrow. Per wife, patient is unreachable as he is at work. She is unsure what meds/diabetes meds he has taken today. Reports he hasn't had any recent CP, fever, cough. Hasn't seen a cardiologist in 5-6 years.  Chart forwarded to anesthesia    01/10/22 0831  Pre-op Phone Call  Surgery Date Verified 01/11/22  Arrival Time Verified 0730  Surgery Location Verified Admitting  Medical History Reviewed No (instructions only)  Is the patient taking a GLP-1 receptor agonist? Yes  Has the patient been informed on holding medication? Yes (spoke with wife, she's not sure if he's taken it today)  Does the patient have diabetes? Type II  Does the patient use a Continuous Blood Glucose Monitor? No  Is the patient on an insulin pump? No  Has the diabetes coordinator been notified? No  Do you have a history of heart problems? Yes  Cardiologist Name hasn't seen one in 5-6 years per wife  Have you ever had tests on your heart? Yes  What cardiac tests were performed? Echo;EKG  What date/year were cardiac tests completed? see epic  Results viewable: Care Everywhere  Patient educated on enhanced recovery. N/A  Patient educated about smoking cessation 24 hours prior to surgery. N/A Non-Smoker  Med Rec Completed No  Take the Following Meds the Morning of Surgery lipitor, coreg, clonidine, lexapro, fenofibrate, hydralazine, imdur, zoloft, prn: tylenol, meclizine, robaxin, nasal spray. HOLD Farxiga 72 hours, Levemir 1/2 dose HS, HOLD TRULICITY, Humalog only 1/2 dose if  CBG>220. No vitamins, no herbal supplements  NPO (Including gum & candy) After midnight  Stop Solids, Milk, Candy, and Gum STARTING AT MIDNIGHT  Responsible adult to drive and be with you for 24 hours? Yes  Name & Phone Number for Ride/Caregiver Jakubowicz,[name] (Spouse) [phone] (Mobile)  No Jewelry, money, nail polish or make-up.  No lotions, powders,  perfumes. No shaving  48 hrs. prior to surgery. Yes  Contacts, Dentures & Glasses Will Have to be Removed Before OR. Yes  Instructed to contact the location of procedure/ provider if they or anyone in their household develops symptoms or tests positive for COVID-19, has close contact with someone who tests positive for COVID, or has known exposure to any contagious illness. Yes  Call this number the morning of surgery  with any problems that may cancel your surgery. [phone]  Covid-19 Assessment  Have you had a positive COVID-19 test within the previous 90 days? No (09/11/21 positive)  COVID Testing Guidance Proceed with the additional questions.  Patient's surgery required a COVID-19 test (cardiothoracic, complex ENT, and bronchoscopies/ EBUS) No

## 2022-01-11 ENCOUNTER — Encounter (HOSPITAL_COMMUNITY): Payer: Self-pay | Admitting: Vascular Surgery

## 2022-01-11 ENCOUNTER — Ambulatory Visit (HOSPITAL_COMMUNITY): Payer: BC Managed Care – PPO | Admitting: Physician Assistant

## 2022-01-11 ENCOUNTER — Ambulatory Visit (HOSPITAL_COMMUNITY)
Admission: RE | Admit: 2022-01-11 | Discharge: 2022-01-11 | Disposition: A | Discharge status: Home / Self Care | Payer: BC Managed Care – PPO | Attending: Vascular Surgery | Admitting: Vascular Surgery

## 2022-01-11 ENCOUNTER — Other Ambulatory Visit: Payer: Self-pay

## 2022-01-11 ENCOUNTER — Encounter (HOSPITAL_COMMUNITY)
Admission: RE | Disposition: A | Discharge status: Home / Self Care | Payer: Self-pay | Source: Home / Self Care | Attending: Vascular Surgery

## 2022-01-11 DIAGNOSIS — N186 End stage renal disease: Secondary | ICD-10-CM | POA: Insufficient documentation

## 2022-01-11 DIAGNOSIS — E785 Hyperlipidemia, unspecified: Secondary | ICD-10-CM | POA: Diagnosis not present

## 2022-01-11 DIAGNOSIS — N184 Chronic kidney disease, stage 4 (severe): Secondary | ICD-10-CM

## 2022-01-11 DIAGNOSIS — I4891 Unspecified atrial fibrillation: Secondary | ICD-10-CM | POA: Insufficient documentation

## 2022-01-11 DIAGNOSIS — Z6841 Body Mass Index (BMI) 40.0 and over, adult: Secondary | ICD-10-CM | POA: Diagnosis not present

## 2022-01-11 DIAGNOSIS — Z79899 Other long term (current) drug therapy: Secondary | ICD-10-CM | POA: Diagnosis not present

## 2022-01-11 DIAGNOSIS — Z7982 Long term (current) use of aspirin: Secondary | ICD-10-CM | POA: Diagnosis not present

## 2022-01-11 DIAGNOSIS — Z992 Dependence on renal dialysis: Secondary | ICD-10-CM | POA: Diagnosis not present

## 2022-01-11 DIAGNOSIS — Z794 Long term (current) use of insulin: Secondary | ICD-10-CM | POA: Diagnosis not present

## 2022-01-11 DIAGNOSIS — I120 Hypertensive chronic kidney disease with stage 5 chronic kidney disease or end stage renal disease: Secondary | ICD-10-CM | POA: Insufficient documentation

## 2022-01-11 DIAGNOSIS — Z9151 Personal history of suicidal behavior: Secondary | ICD-10-CM | POA: Diagnosis not present

## 2022-01-11 DIAGNOSIS — E1122 Type 2 diabetes mellitus with diabetic chronic kidney disease: Secondary | ICD-10-CM | POA: Diagnosis not present

## 2022-01-11 HISTORY — PX: AV FISTULA PLACEMENT: SHX1204

## 2022-01-11 LAB — POCT I-STAT, CHEM 8
BUN: 71 mg/dL — ABNORMAL HIGH (ref 8–23)
Calcium, Ion: 1.09 mmol/L — ABNORMAL LOW (ref 1.15–1.40)
Chloride: 107 mmol/L (ref 98–111)
Creatinine, Ser: 4.4 mg/dL — ABNORMAL HIGH (ref 0.61–1.24)
Glucose, Bld: 181 mg/dL — ABNORMAL HIGH (ref 70–99)
HCT: 29 % — ABNORMAL LOW (ref 39.0–52.0)
Hemoglobin: 9.9 g/dL — ABNORMAL LOW (ref 13.0–17.0)
Potassium: 4.3 mmol/L (ref 3.5–5.1)
Sodium: 141 mmol/L (ref 135–145)
TCO2: 21 mmol/L — ABNORMAL LOW (ref 22–32)

## 2022-01-11 LAB — GLUCOSE, CAPILLARY
Glucose-Capillary: 167 mg/dL — ABNORMAL HIGH (ref 70–99)
Glucose-Capillary: 152 mg/dL — ABNORMAL HIGH (ref 70–99)

## 2022-01-11 SURGERY — ARTERIOVENOUS (AV) FISTULA CREATION
Anesthesia: Monitor Anesthesia Care | Site: Arm Upper | Laterality: Left

## 2022-01-11 MED ORDER — PROPOFOL 10 MG/ML IV BOLUS
INTRAVENOUS | Status: DC | PRN
  Administered 2022-01-11: 25 ug/kg/min via INTRAVENOUS
  Administered 2022-01-11: 20 mg via INTRAVENOUS

## 2022-01-11 MED ORDER — MIDAZOLAM HCL 2 MG/2ML IJ SOLN: 2.0000 mg | Freq: Once | INTRAMUSCULAR | Status: AC

## 2022-01-11 MED ORDER — CHLORHEXIDINE GLUCONATE 4 % EX LIQD: 60.0000 mL | Freq: Once | CUTANEOUS | Status: DC

## 2022-01-11 MED ORDER — DEXTROSE 5 % IV SOLN
INTRAVENOUS | Status: DC | PRN
  Administered 2022-01-11: 3 g via INTRAVENOUS

## 2022-01-11 MED ORDER — SODIUM CHLORIDE 0.9 % IV SOLN: INTRAVENOUS | Status: DC

## 2022-01-11 MED ORDER — LIDOCAINE-EPINEPHRINE (PF) 1 %-1:200000 IJ SOLN
INTRAMUSCULAR | Status: AC
  Filled 2022-01-11: qty 30

## 2022-01-11 MED ORDER — OXYCODONE-ACETAMINOPHEN 5-325 MG PO TABS: 1.0000 | ORAL_TABLET | Freq: Four times a day (QID) | ORAL | 0 refills | Status: DC | PRN

## 2022-01-11 MED ORDER — SODIUM CHLORIDE 0.9 % IV SOLN: INTRAVENOUS | Status: DC | PRN

## 2022-01-11 MED ORDER — CHLORHEXIDINE GLUCONATE 0.12 % MT SOLN: 15.0000 mL | OROMUCOSAL | Status: AC

## 2022-01-11 MED ORDER — ROPIVACAINE HCL 5 MG/ML IJ SOLN: INTRAMUSCULAR | Status: DC | PRN

## 2022-01-11 MED ORDER — HEPARIN 6000 UNIT IRRIGATION SOLUTION
Status: AC
  Filled 2022-01-11: qty 500

## 2022-01-11 MED ORDER — MIDAZOLAM HCL 2 MG/2ML IJ SOLN
INTRAMUSCULAR | Status: AC
  Administered 2022-01-11: 2 mg via INTRAVENOUS
  Filled 2022-01-11: qty 2

## 2022-01-11 MED ORDER — 0.9 % SODIUM CHLORIDE (POUR BTL) OPTIME
TOPICAL | Status: DC | PRN
  Administered 2022-01-11: 1000 mL

## 2022-01-11 MED ORDER — BUPIVACAINE HCL (PF) 0.25 % IJ SOLN
INTRAMUSCULAR | Status: DC | PRN
  Administered 2022-01-11: 10 mL via PERINEURAL

## 2022-01-11 MED ORDER — DEXAMETHASONE SODIUM PHOSPHATE 10 MG/ML IJ SOLN
INTRAMUSCULAR | Status: DC | PRN
  Administered 2022-01-11: 5 mg via INTRAVENOUS

## 2022-01-11 MED ORDER — CEFAZOLIN IN SODIUM CHLORIDE 3-0.9 GM/100ML-% IV SOLN
INTRAVENOUS | Status: AC
  Filled 2022-01-11: qty 100

## 2022-01-11 MED ORDER — ONDANSETRON HCL 4 MG/2ML IJ SOLN
INTRAMUSCULAR | Status: DC | PRN
  Administered 2022-01-11: 4 mg via INTRAVENOUS

## 2022-01-11 MED ORDER — CEFAZOLIN IN SODIUM CHLORIDE 3-0.9 GM/100ML-% IV SOLN: 3.0000 g | INTRAVENOUS | Status: DC

## 2022-01-11 MED ORDER — ROPIVACAINE HCL 5 MG/ML IJ SOLN
INTRAMUSCULAR | Status: DC | PRN
  Administered 2022-01-11: 30 mL via PERINEURAL

## 2022-01-11 MED ORDER — FENTANYL CITRATE (PF) 100 MCG/2ML IJ SOLN
INTRAMUSCULAR | Status: AC
  Filled 2022-01-11: qty 2

## 2022-01-11 MED ORDER — CHLORHEXIDINE GLUCONATE 0.12 % MT SOLN
OROMUCOSAL | Status: AC
  Administered 2022-01-11: 15 mL via OROMUCOSAL
  Filled 2022-01-11: qty 15

## 2022-01-11 MED ORDER — HEPARIN 6000 UNIT IRRIGATION SOLUTION
Status: DC | PRN
  Administered 2022-01-11: 1

## 2022-01-11 SURGICAL SUPPLY — 35 items
ADH SKN CLS APL DERMABOND .7 (GAUZE/BANDAGES/DRESSINGS) ×1
ARMBAND PINK RESTRICT EXTREMIT (MISCELLANEOUS) ×1 IMPLANT
BAG COUNTER SPONGE SURGICOUNT (BAG) ×1 IMPLANT
BAG SPNG CNTER NS LX DISP (BAG) ×1
BNDG ELASTIC 4X5.8 VLCR STR LF (GAUZE/BANDAGES/DRESSINGS) ×1 IMPLANT
CANISTER SUCT 3000ML PPV (MISCELLANEOUS) ×1 IMPLANT
CLIP LIGATING EXTRA MED SLVR (CLIP) ×1 IMPLANT
CLIP LIGATING EXTRA SM BLUE (MISCELLANEOUS) ×1 IMPLANT
CLIP VESOCCLUDE MED 6/CT (CLIP) ×1 IMPLANT
COVER PROBE W GEL 5X96 (DRAPES) ×1 IMPLANT
DERMABOND ADVANCED .7 DNX12 (GAUZE/BANDAGES/DRESSINGS) ×1 IMPLANT
ELECT REM PT RETURN 9FT ADLT (ELECTROSURGICAL) ×1
ELECTRODE REM PT RTRN 9FT ADLT (ELECTROSURGICAL) ×1 IMPLANT
GLOVE SRG 8 PF TXTR STRL LF DI (GLOVE) ×1 IMPLANT
GLOVE SURG UNDER POLY LF SZ8 (GLOVE) ×1
GOWN STRL REUS W/ TWL LRG LVL3 (GOWN DISPOSABLE) ×2 IMPLANT
GOWN STRL REUS W/TWL 2XL LVL3 (GOWN DISPOSABLE) ×2 IMPLANT
GOWN STRL REUS W/TWL LRG LVL3 (GOWN DISPOSABLE) ×2
KIT BASIN OR (CUSTOM PROCEDURE TRAY) ×1 IMPLANT
KIT TURNOVER KIT B (KITS) ×1 IMPLANT
LOOP VESSEL MINI RED (MISCELLANEOUS) IMPLANT
NS IRRIG 1000ML POUR BTL (IV SOLUTION) ×1 IMPLANT
PACK CV ACCESS (CUSTOM PROCEDURE TRAY) ×1 IMPLANT
PAD ARMBOARD 7.5X6 YLW CONV (MISCELLANEOUS) ×2 IMPLANT
SLING ARM FOAM STRAP LRG (SOFTGOODS) IMPLANT
SUT MNCRL AB 4-0 PS2 18 (SUTURE) ×1 IMPLANT
SUT PROLENE 6 0 BV (SUTURE) ×1 IMPLANT
SUT PROLENE 7 0 BV 1 (SUTURE) IMPLANT
SUT SILK 2 0 SH (SUTURE) ×1 IMPLANT
SUT SILK 3 0 SH CR/8 (SUTURE) ×1 IMPLANT
SUT VIC AB 3-0 SH 27 (SUTURE) ×1
SUT VIC AB 3-0 SH 27X BRD (SUTURE) ×1 IMPLANT
TOWEL GREEN STERILE (TOWEL DISPOSABLE) ×1 IMPLANT
UNDERPAD 30X36 HEAVY ABSORB (UNDERPADS AND DIAPERS) ×1 IMPLANT
WATER STERILE IRR 1000ML POUR (IV SOLUTION) ×1 IMPLANT

## 2022-01-11 NOTE — Op Note (Signed)
    NAME: Timothy Holloway    MRN: [id number] DOB: 01-30-1956    DATE OF OPERATION: 01/11/2022  PREOP DIAGNOSIS:    Stage IV chronic kidney  POSTOP DIAGNOSIS:    Same  PROCEDURE:    Left arm brachiocephalic fistula creation  SURGEON: [name]  ASSIST: Paulo Fruit, PA  ANESTHESIA: moderate, block   EBL: 84ml  INDICATIONS:    Timothy Holloway is a 65 y.o. male patient with stage IV chronic kidney disease.  He presented to my office in need of long-term HD access.  Adequate cephalic vein in the left upper extremity.  He has rest.  After discussing the risk and benefits of left-sided brachiocephalic fistula creation, Timothy Holloway elected to proceed.  FINDINGS:   64mm sclerotic cephalic vein 19mm atherosclerotic brachial artery   TECHNIQUE:   The patient was brought to the operating room and placed in supine position. The left arm was prepped and draped in standard fashion. IV antibiotics were administered. A timeout was performed.   The case began with ultrasound insonation of the brachial artery and cephalic vein, which demonstrated sufficient size at the antecubital fossa for arteriovenous fistula.   A transverse incision was made below the elbow creese in the antecubital fossa. The  cephalic vein was isolated for 3 cm in length. Next the aponeurosis was partially released and the brachial artery secured with a vessel loop. The patient was heparinized. The cephalic vein was transected and ligated distally with a 2-0 silk stick-tie. The vein was dilated with coronary dilators and flushed with heparin saline. Vascular clamps were placed proximally and distally on the brachial artery and a 5 mm arteriotomy was created on the brachial artery. This was flushed with heparin saline.  An anastomosis was created in end to side fashion on the brachial artery using running 6-0 Prolene suture.  Prior to completing the anastomosis, the vessels were flushed and the suture line was tied  down. There was an excellent thrill in the cephalic vein from the anastomosis to the mid upper bicipital region. The patient had a multiphasic radial and ulnar signal. He had an excellent doppler signal in the fistula. The incision was irrigated and hemostasis achieved with cautery and suture. The deeper tissue was closed with 3-0 Vicryl and the skin closed with 4-0 Monocryl.  Dermabond was applied to the incisions. The patient was transferred to PACU in stable condition.  Given the complexity of the case,  the assistant was necessary in order to expedient the procedure and safely perform the technical aspects of the operation.  The assistant provided traction and countertraction to assist with exposure of the artery and vein.  They also assisted with suture ligation of multiple venous branches.  They played a critical role in the anastomosis. These skills, especially following the Prolene suture for the anastomosis, could not have been adequately performed by a scrub tech assistant.    Macie Burows, MD [facility] DATE OF DICTATION:   01/11/2022

## 2022-01-11 NOTE — Discharge Instructions (Signed)
[facility]  Discharge Instructions  AV Fistula or Graft Surgery for Dialysis Access  Please refer to the following instructions for your post-procedure care. Your surgeon or physician assistant will discuss any changes with you.  Activity  You may drive the day following your surgery, if you are comfortable and no longer taking prescription pain medication. Resume full activity as the soreness in your incision resolves.  Bathing/Showering  You may shower after you go home. Keep your incision dry for 48 hours. Do not soak in a bathtub, hot tub, or swim until the incision heals completely. You may not shower if you have a hemodialysis catheter.  Incision Care  Clean your incision with mild soap and water after 48 hours. Pat the area dry with a clean towel. You do not need a bandage unless otherwise instructed. Do not apply any ointments or creams to your incision. You may have skin glue on your incision. Do not peel it off. It will come off on its own in about one week. Your arm may swell a bit after surgery. To reduce swelling use pillows to elevate your arm so it is above your heart. Your doctor will tell you if you need to lightly wrap your arm with an ACE bandage.  Diet  Resume your normal diet. There are not special food restrictions following this procedure. In order to heal from your surgery, it is CRITICAL to get adequate nutrition. Your body requires vitamins, minerals, and protein. Vegetables are the best source of vitamins and minerals. Vegetables also provide the perfect balance of protein. Processed food has little nutritional value, so try to avoid this.  Medications  Resume taking all of your medications. If your incision is causing pain, you may take over-the counter pain relievers such as acetaminophen (Tylenol). If you were prescribed a stronger pain medication, please be aware these medications can cause nausea and constipation. Prevent  nausea by taking the medication with a snack or meal. Avoid constipation by drinking plenty of fluids and eating foods with high amount of fiber, such as fruits, vegetables, and grains.  Do not take Tylenol if you are taking prescription pain medications.  Follow up Your surgeon may want to see you in the office following your access surgery. If so, this will be arranged at the time of your surgery.  Please call us immediately for any of the following conditions:  Increased pain, redness, drainage (pus) from your incision site Fever of 101 degrees or higher Severe or worsening pain at your incision site Hand pain or numbness.  Reduce your risk of vascular disease:  Stop smoking. If you would like help, call [facility] at 1-800-QUIT-NOW 279-516-3583) or [place] at [place] your cholesterol Maintain a desired weight Control your diabetes Keep your blood pressure down  Dialysis  It will take several weeks to several months for your new dialysis access to be ready for use. Your surgeon will determine when it is okay to use it. Your nephrologist will continue to direct your dialysis. You can continue to use your Permcath until your new access is ready for use.   01/11/2022 Timothy Holloway Timothy Holloway [id number] [birth year]  Surgeon(s): [name], MD  Procedure(s): LEFT ARM ARTERIOVENOUS (AV) FISTULA CREATION   May stick graft immediately   May stick graft on designated area only:   X Do not stick Left AV fistula for 12 weeks    If you have any questions, please call the office  at [phone].

## 2022-01-11 NOTE — Transfer of Care (Signed)
Immediate Anesthesia Transfer of Care Note  Patient: Timothy Holloway  Procedure(s) Performed: LEFT ARM ARTERIOVENOUS (AV) FISTULA CREATION (Left: Arm Upper)  Patient Location: PACU  Anesthesia Type:MAC and Regional  Level of Consciousness: awake, alert , oriented, and patient cooperative  Airway & Oxygen Therapy: Patient Spontanous Breathing  Post-op Assessment: Report given to RN and Post -op Vital signs reviewed and stable  Post vital signs: stable  Last Vitals:  Vitals Value Taken Time  BP 174/65 01/11/22 1115  Temp    Pulse 54 01/11/22 1115  Resp 13 01/11/22 1115  SpO2 97 % 01/11/22 1115  Vitals shown include unvalidated device data.  Last Pain:  Vitals:   01/11/22 0840  TempSrc:   PainSc: 0-No pain         Complications: No notable events documented.

## 2022-01-11 NOTE — Anesthesia Procedure Notes (Addendum)
Anesthesia Regional Block: Supraclavicular block   Pre-Anesthetic Checklist: , timeout performed,  Correct Patient, Correct Site, Correct Laterality,  Correct Procedure, Correct Position, site marked,  Risks and benefits discussed,  Surgical consent,  Pre-op evaluation,  At surgeon's request and post-op pain management  Laterality: Left  Prep: chloraprep       Needles:  Injection technique: Single-shot  Needle Type: Echogenic Stimulator Needle     Needle Length: 9cm  Needle Gauge: 21     Additional Needles:   Procedures:,,,, ultrasound used (permanent image in chart),,    Narrative:  Start time: 01/11/2022 8:43 AM End time: 01/11/2022 8:46 AM Injection made incrementally with aspirations every 5 mL.  Performed by: Personally  Anesthesiologist: Nilda Simmer, MD  Additional Notes: Discussed risks and benefits of nerve block including, but not limited to, prolonged and/or permanent nerve injury involving sensory and/or motor function. Monitors were applied and a time-out was performed. The nerve and associated structures were visualized under ultrasound guidance. After negative aspiration, local anesthetic was slowly injected around the nerve. There was no evidence of high pressure during the procedure. There were no paresthesias. VSS remained stable and the patient tolerated the procedure well.  Axillary ring performed with 10 mL of 0.25% bupivacaine for intercostobrachial nerve.

## 2022-01-11 NOTE — Anesthesia Postprocedure Evaluation (Signed)
Anesthesia Post Note  Patient: Timothy Holloway  Procedure(s) Performed: LEFT ARM ARTERIOVENOUS (AV) FISTULA CREATION (Left: Arm Upper)     Patient location during evaluation: PACU Anesthesia Type: Regional and MAC Level of consciousness: awake Pain management: pain level controlled Vital Signs Assessment: post-procedure vital signs reviewed and stable Respiratory status: spontaneous breathing, nonlabored ventilation and respiratory function stable Cardiovascular status: stable and blood pressure returned to baseline Postop Assessment: no apparent nausea or vomiting Anesthetic complications: no   No notable events documented.  Last Vitals:  Vitals:   01/11/22 1130 01/11/22 1145  BP: (!) 165/60 (!) 168/70  Pulse: (!) 58 (!) 53  Resp: 17 14  Temp:  36.7 C  SpO2: 96% 98%    Last Pain:  Vitals:   01/11/22 1115  TempSrc:   PainSc: 0-No pain                 Nilda Simmer

## 2022-01-11 NOTE — H&P (Signed)
Office Note   Patient seen and examined in preop holding.  No complaints. No changes to medication history or physical exam since last seen in clinic. After discussing the risks and benefits of left arm fistula creation, Timothy Holloway elected to proceed.  He is aware that with the size of his arm, he will likely require superficialization, even if a brachiocephalic fistula is created.   Timothy John MD   CC:  ESRD Requesting Provider:  No ref. provider found  HPI: Timothy Holloway is a Right handed 66 y.o. (06-02-1955) male with kidney disease who presents at the request of No ref. provider found for permanent HD access. The patient has had no prior access procedures.   On exam, Timothy Holloway was doing well.  Originally from [place], he now lives in [place].  He continues to work full-time at [employer] working 12-hour shifts.  Creatinine has continued to decline.  Currently CKD 4.  Jamion denies previous upper extremity surgeries.   The pt is  on a statin for cholesterol management.  The pt is  on a daily aspirin.   Other AC:  - The pt is on medications for hypertension.   The pt is  diabetic. Tobacco hx:  -  Past Medical History:  Diagnosis Date   Cataract    Chronic kidney disease (CKD), stage III (moderate) ([place])    dr Buddy Duty   Diabetes mellitus without complication ([place])    Hyperlipidemia    Hypertension    Impotence    Insomnia    Obesity     Past Surgical History:  Procedure Laterality Date   KNEE SURGERY Left 01-31-2002   SPINE SURGERY     fusion of L4-L5    Social History   Socioeconomic History   Marital status: Married    Spouse name: Not on file   Number of children: Not on file   Years of education: Not on file   Highest education level: Not on file  Occupational History   Not on file  Tobacco Use   Smoking status: Never   Smokeless tobacco: Not on file  Substance and Sexual Activity   Alcohol use: No   Drug use: No   Sexual activity: Yes     Comment: not much  Other Topics Concern   Not on file  Social History Narrative   Not on file   Social Determinants of Health   Financial Resource Strain: Not on file  Food Insecurity: Not on file  Transportation Needs: Not on file  Physical Activity: Not on file  Stress: Not on file  Social Connections: Not on file  Intimate Partner Violence: Not on file   Family History  Problem Relation Age of Onset   Diabetes Mother    Cancer Father        lymphoma    Current Facility-Administered Medications  Medication Dose Route Frequency Provider Last Rate Last Admin   0.9 %  sodium chloride infusion   Intravenous Continuous Timothy John, MD       0.9 %  sodium chloride infusion   Intravenous Continuous Nilda Simmer, MD 10 mL/hr at 01/11/22 0820 New Bag at 01/11/22 0820   ceFAZolin (ANCEF) 3-0.9 GM/100ML-% IVPB            ceFAZolin (ANCEF) IVPB 3g/100 mL premix  3 g Intravenous 30 min Pre-Op Timothy John, MD       chlorhexidine (HIBICLENS) 4 % liquid 4 Application  60 mL Topical Once  Timothy John, MD       And   Derrill Memo ON 01/12/2022] chlorhexidine (HIBICLENS) 4 % liquid 4 Application  60 mL Topical Once Timothy John, MD       Facility-Administered Medications Ordered in Other Encounters  Medication Dose Route Frequency Provider Last Rate Last Admin   bupivacaine (PF) (MARCAINE) 0.25 % injection   Peri-NEURAL Anesthesia Intra-op Nilda Simmer, MD   10 mL at 01/11/22 0848   ropivacaine (PF) 5 mg/mL (0.5%) (NAROPIN) injection   Peri-NEURAL Anesthesia Intra-op Nilda Simmer, MD   30 mL at 01/11/22 0846    Allergies  Allergen Reactions   Latex Hives   Lodine [Etodolac] Nausea And Vomiting     REVIEW OF SYSTEMS:   [X]  denotes positive finding, [ ]  denotes negative finding Cardiac  Comments:  Chest pain or chest pressure:    Shortness of breath upon exertion:    Short of breath when lying flat:    Irregular heart rhythm:         Vascular    Pain in calf, thigh, or hip brought on by ambulation:    Pain in feet at night that wakes you up from your sleep:     Blood clot in your veins:    Leg swelling:         Pulmonary    Oxygen at home:    Productive cough:     Wheezing:         Neurologic    Sudden weakness in arms or legs:     Sudden numbness in arms or legs:     Sudden onset of difficulty speaking or slurred speech:    Temporary loss of vision in one eye:     Problems with dizziness:         Gastrointestinal    Blood in stool:     Vomited blood:         Genitourinary    Burning when urinating:     Blood in urine:        Psychiatric    Major depression:         Hematologic    Bleeding problems:    Problems with blood clotting too easily:        Skin    Rashes or ulcers:        Constitutional    Fever or chills:      PHYSICAL EXAMINATION:  Vitals:   01/11/22 0806 01/11/22 0835 01/11/22 0840  BP: (!) 180/70 (!) 185/77   Pulse: (!) 56 (!) 55 (!) 58  Resp: 18 16 18   Temp: 98.1 F (36.7 C)    TempSrc: Oral    SpO2: 96% 98% 97%  Weight: 133.8 kg    Height: 6' (1.829 m)      General:  WDWN in NAD; vital signs documented above Gait: Not observed HENT: WNL, normocephalic Pulmonary: normal non-labored breathing , without Rales, rhonchi,  wheezing Cardiac: regular HR Abdomen: soft, NT, no masses Skin: without rashes Vascular Exam/Pulses:  Right Left  Radial 2+ (normal) 2+ (normal)                       Extremities: without ischemic changes, without Gangrene , without cellulitis; without open wounds;  Musculoskeletal: no muscle wasting or atrophy  Neurologic: A&O X 3;  No focal weakness or paresthesias are detected Psychiatric:  The pt has Normal affect.   Non-Invasive Vascular Imaging:     +-----------------+-------------+----------+---------+  Right Cephalic   Diameter (cm)Depth (cm)Findings   +-----------------+-------------+----------+---------+  Shoulder             0.30                          +-----------------+-------------+----------+---------+  Prox upper arm       0.35               branching  +-----------------+-------------+----------+---------+  Mid upper arm        0.35               branching  +-----------------+-------------+----------+---------+  Dist upper arm    0.36 / .040           branching  +-----------------+-------------+----------+---------+  Antecubital fossa    0.39                          +-----------------+-------------+----------+---------+  Prox forearm      0.33 / 0.30                      +-----------------+-------------+----------+---------+  Mid forearm          0.29                          +-----------------+-------------+----------+---------+  Dist forearm         0.28                          +-----------------+-------------+----------+---------+   +-----------------+-------------+----------+---------+  Right Basilic    Diameter (cm)Depth (cm)Findings   +-----------------+-------------+----------+---------+  Prox upper arm       0.55               branching  +-----------------+-------------+----------+---------+  Mid upper arm        0.32                          +-----------------+-------------+----------+---------+  Dist upper arm    0.34 / 0.24           branching  +-----------------+-------------+----------+---------+  Antecubital fossa    0.27                          +-----------------+-------------+----------+---------+  Prox forearm         0.29                          +-----------------+-------------+----------+---------+   +-----------------+-------------+----------+---------+  Left Cephalic    Diameter (cm)Depth (cm)Findings   +-----------------+-------------+----------+---------+  Shoulder            0.46                          +-----------------+-------------+----------+---------+  Prox upper arm        0.38               branching  +-----------------+-------------+----------+---------+  Mid upper arm        0.38               branching  +-----------------+-------------+----------+---------+  Dist upper arm       0.44                          +-----------------+-------------+----------+---------+  Antecubital fossa    0.39                          +-----------------+-------------+----------+---------+  Prox forearm      0.35 / 0.41                      +-----------------+-------------+----------+---------+  Mid forearm          0.32                          +-----------------+-------------+----------+---------+  Dist forearm         0.29                          +-----------------+-------------+----------+---------+   +-----------------+----------------+----------+---------+  Left Basilic      Diameter (cm)  Depth (cm)Findings   +-----------------+----------------+----------+---------+  Prox upper arm   0.32 / 0.36 0.35                     +-----------------+----------------+----------+---------+  Mid upper arm          0.36                branching  +-----------------+----------------+----------+---------+  Dist upper arm         0.40                           +-----------------+----------------+----------+---------+  Antecubital fossa      0.22                           +-----------------+----------------+----------+---------+  Prox forearm           0.26                           +-----------------+----------------+----------+---------+     ASSESSMENT/PLAN:  SAVVA BEAMER is a 66 y.o. male who presents with chronic kidney disease stage 4  Based on vein mapping and examination, parents would be best treated with left-sided brachiocephalic fistula creation. I had an extensive discussion with this patient in regards to the nature of access surgery, including risk, benefits, and alternatives.   The patient is  aware that the risks of access surgery include but are not limited to: bleeding, infection, steal syndrome, nerve damage, ischemic monomelic neuropathy, failure of access to mature, complications related to venous hypertension, and possible need for additional access procedures in the future. I discussed with the patient the nature of the staged access procedure, specifically the need for a second operation to transpose the first stage fistula if it matures adequately.   The patient has agreed to proceed with the above procedure which will be scheduled for next Tuesday.  Timothy John, MD [facility] [phone] Total time of patient care including pre-visit research, consultation, and documentation greater than 30 minutes

## 2022-01-12 ENCOUNTER — Encounter (HOSPITAL_COMMUNITY): Payer: Self-pay | Admitting: Vascular Surgery

## 2022-01-14 ENCOUNTER — Ambulatory Visit (HOSPITAL_COMMUNITY)
Admission: RE | Admit: 2022-01-14 | Discharge: 2022-01-14 | Disposition: A | Discharge status: Home / Self Care | Payer: BC Managed Care – PPO | Source: Ambulatory Visit | Attending: Nephrology | Admitting: Nephrology

## 2022-01-14 VITALS — BP 171/60 | HR 61 | Temp 97.6°F | Resp 17

## 2022-01-14 DIAGNOSIS — N184 Chronic kidney disease, stage 4 (severe): Secondary | ICD-10-CM | POA: Insufficient documentation

## 2022-01-14 DIAGNOSIS — D631 Anemia in chronic kidney disease: Secondary | ICD-10-CM | POA: Diagnosis present

## 2022-01-14 LAB — RENAL FUNCTION PANEL
Albumin: 3.1 g/dL — ABNORMAL LOW (ref 3.5–5.0)
Anion gap: 12 (ref 5–15)
BUN: 87 mg/dL — ABNORMAL HIGH (ref 8–23)
CO2: 21 mmol/L — ABNORMAL LOW (ref 22–32)
Calcium: 8.3 mg/dL — ABNORMAL LOW (ref 8.9–10.3)
Chloride: 106 mmol/L (ref 98–111)
Creatinine, Ser: 4.53 mg/dL — ABNORMAL HIGH (ref 0.61–1.24)
GFR, Estimated: 13 mL/min — ABNORMAL LOW (ref >60)
Glucose, Bld: 144 mg/dL — ABNORMAL HIGH (ref 70–99)
Phosphorus: 6.7 mg/dL — ABNORMAL HIGH (ref 2.5–4.6)
Potassium: 4.6 mmol/L (ref 3.5–5.1)
Sodium: 139 mmol/L (ref 135–145)

## 2022-01-14 LAB — POCT HEMOGLOBIN-HEMACUE: Hemoglobin: 9.9 g/dL — ABNORMAL LOW (ref 13.0–17.0)

## 2022-01-14 LAB — IRON AND TIBC
Iron: 48 ug/dL (ref 45–182)
Saturation Ratios: 15 % — ABNORMAL LOW (ref 17.9–39.5)
TIBC: 314 ug/dL (ref 250–450)
UIBC: 266 ug/dL

## 2022-01-14 LAB — FERRITIN: Ferritin: 947 ng/mL — ABNORMAL HIGH (ref 24–336)

## 2022-01-14 MED ORDER — EPOETIN ALFA-EPBX 10000 UNIT/ML IJ SOLN: 20000.0000 [IU] | INTRAMUSCULAR | Status: DC

## 2022-01-14 MED ORDER — EPOETIN ALFA-EPBX 10000 UNIT/ML IJ SOLN
INTRAMUSCULAR | Status: AC
  Administered 2022-01-14: 20000 [IU] via SUBCUTANEOUS
  Filled 2022-01-14: qty 2

## 2022-01-18 LAB — PTH, INTACT AND CALCIUM
Calcium, Total (PTH): 8.2 mg/dL — ABNORMAL LOW (ref 8.6–10.2)
PTH: 185 pg/mL — ABNORMAL HIGH (ref 15–65)

## 2022-01-28 ENCOUNTER — Ambulatory Visit (HOSPITAL_COMMUNITY)
Admission: RE | Admit: 2022-01-28 | Discharge: 2022-01-28 | Disposition: A | Discharge status: Home / Self Care | Payer: BC Managed Care – PPO | Source: Ambulatory Visit | Attending: Nephrology | Admitting: Nephrology

## 2022-01-28 VITALS — BP 169/63 | HR 64 | Temp 97.3°F

## 2022-01-28 DIAGNOSIS — D631 Anemia in chronic kidney disease: Secondary | ICD-10-CM | POA: Insufficient documentation

## 2022-01-28 DIAGNOSIS — N184 Chronic kidney disease, stage 4 (severe): Secondary | ICD-10-CM | POA: Diagnosis present

## 2022-01-28 LAB — POCT HEMOGLOBIN-HEMACUE: Hemoglobin: 9.3 g/dL — ABNORMAL LOW (ref 13.0–17.0)

## 2022-01-28 MED ORDER — EPOETIN ALFA-EPBX 10000 UNIT/ML IJ SOLN
20000.0000 [IU] | INTRAMUSCULAR | Status: DC
  Administered 2022-01-28: 20000 [IU] via SUBCUTANEOUS

## 2022-01-28 MED ORDER — EPOETIN ALFA-EPBX 10000 UNIT/ML IJ SOLN
INTRAMUSCULAR | Status: AC
  Filled 2022-01-28: qty 2

## 2022-02-04 ENCOUNTER — Other Ambulatory Visit: Payer: Self-pay | Admitting: *Deleted

## 2022-02-04 DIAGNOSIS — N184 Chronic kidney disease, stage 4 (severe): Secondary | ICD-10-CM

## 2022-02-11 ENCOUNTER — Encounter (HOSPITAL_COMMUNITY)
Admission: RE | Admit: 2022-02-11 | Discharge: 2022-02-11 | Disposition: A | Discharge status: Home / Self Care | Payer: BC Managed Care – PPO | Source: Ambulatory Visit | Attending: Nephrology | Admitting: Nephrology

## 2022-02-11 VITALS — BP 173/67 | HR 66 | Temp 97.0°F | Resp 18

## 2022-02-11 DIAGNOSIS — D631 Anemia in chronic kidney disease: Secondary | ICD-10-CM | POA: Diagnosis present

## 2022-02-11 DIAGNOSIS — N184 Chronic kidney disease, stage 4 (severe): Secondary | ICD-10-CM | POA: Diagnosis present

## 2022-02-11 LAB — RENAL FUNCTION PANEL
Albumin: 3.1 g/dL — ABNORMAL LOW (ref 3.5–5.0)
Anion gap: 13 (ref 5–15)
BUN: 95 mg/dL — ABNORMAL HIGH (ref 8–23)
CO2: 20 mmol/L — ABNORMAL LOW (ref 22–32)
Calcium: 8.5 mg/dL — ABNORMAL LOW (ref 8.9–10.3)
Chloride: 105 mmol/L (ref 98–111)
Creatinine, Ser: 4.83 mg/dL — ABNORMAL HIGH (ref 0.61–1.24)
GFR, Estimated: 12 mL/min — ABNORMAL LOW (ref >60)
Glucose, Bld: 102 mg/dL — ABNORMAL HIGH (ref 70–99)
Phosphorus: 6.6 mg/dL — ABNORMAL HIGH (ref 2.5–4.6)
Potassium: 4.4 mmol/L (ref 3.5–5.1)
Sodium: 138 mmol/L (ref 135–145)

## 2022-02-11 LAB — IRON AND TIBC
Iron: 55 ug/dL (ref 45–182)
Saturation Ratios: 16 % — ABNORMAL LOW (ref 17.9–39.5)
TIBC: 342 ug/dL (ref 250–450)
UIBC: 287 ug/dL

## 2022-02-11 LAB — POCT HEMOGLOBIN-HEMACUE: Hemoglobin: 9.5 g/dL — ABNORMAL LOW (ref 13.0–17.0)

## 2022-02-11 LAB — FERRITIN: Ferritin: 899 ng/mL — ABNORMAL HIGH (ref 24–336)

## 2022-02-11 MED ORDER — EPOETIN ALFA-EPBX 10000 UNIT/ML IJ SOLN
INTRAMUSCULAR | Status: AC
  Filled 2022-02-11: qty 2

## 2022-02-11 MED ORDER — EPOETIN ALFA-EPBX 10000 UNIT/ML IJ SOLN
20000.0000 [IU] | INTRAMUSCULAR | Status: DC
  Administered 2022-02-11: 20000 [IU] via SUBCUTANEOUS

## 2022-02-13 LAB — PTH, INTACT AND CALCIUM
Calcium, Total (PTH): 8.5 mg/dL — ABNORMAL LOW (ref 8.6–10.2)
PTH: 125 pg/mL — ABNORMAL HIGH (ref 15–65)

## 2022-02-14 ENCOUNTER — Encounter (HOSPITAL_COMMUNITY): Payer: Self-pay

## 2022-02-18 ENCOUNTER — Ambulatory Visit (INDEPENDENT_AMBULATORY_CARE_PROVIDER_SITE_OTHER): Payer: BC Managed Care – PPO | Admitting: Physician Assistant

## 2022-02-18 ENCOUNTER — Ambulatory Visit (HOSPITAL_COMMUNITY)
Admission: RE | Admit: 2022-02-18 | Discharge: 2022-02-18 | Disposition: A | Discharge status: Home / Self Care | Payer: BC Managed Care – PPO | Source: Ambulatory Visit | Attending: Vascular Surgery | Admitting: Vascular Surgery

## 2022-02-18 VITALS — BP 133/69 | HR 64 | Temp 97.7°F | Resp 20 | Ht 72.0 in | Wt 303.6 lb

## 2022-02-18 DIAGNOSIS — N184 Chronic kidney disease, stage 4 (severe): Secondary | ICD-10-CM

## 2022-02-18 NOTE — Progress Notes (Signed)
Postoperative Access Visit   History of Present Illness   Timothy Holloway is a 67 y.o. year old male who presents for postoperative follow-up for: Left arm brachiocephalic fistula creation on 01/11/22 by Dr. Virl Cagey. The patient's wounds are well healed.  The patient notes no steal symptoms.  He currently is not on Hemodialysis. He is followed by Nephrologist, Dr. Moshe Cipro.   Physical Examination   Vitals:   02/18/22 1223  BP: 133/69  Pulse: 64  Resp: 20  Temp: 97.7 F (36.5 C)  TempSrc: Temporal  SpO2: 97%  Weight: (!) 303 lb 9.6 oz (137.7 kg)  Height: 6' (1.829 m)   Body mass index is 41.18 kg/m.  left arm Incision is well healed, 2+ radial pulse, hand grip is 5/5, sensation in digits is intact, palpable thrill, bruit can  be auscultated    Non invasive vascular lab: Findings:  +--------------------+----------+-----------------+--------+  AVF                PSV (cm/s)Flow Vol (mL/min)Comments  +--------------------+----------+-----------------+--------+  Native artery inflow   437          1395                 +--------------------+----------+-----------------+--------+  AVF Anastomosis        820                               +--------------------+----------+-----------------+--------+     +------------+----------+-------------+----------+----------------+  OUTFLOW VEINPSV (cm/s)Diameter (cm)Depth (cm)    Describe      +------------+----------+-------------+----------+----------------+  Shoulder      202        0.65        0.77                     +------------+----------+-------------+----------+----------------+  Prox UA        296        0.64        0.84   competing branch  +------------+----------+-------------+----------+----------------+  Mid UA         186        0.68        0.69                     +------------+----------+-------------+----------+----------------+  Dist UA        310        0.74         0.30   competing branch  +------------+----------+-------------+----------+----------------+  AC Fossa       553        0.83        0.46                     +------------+----------+-------------+----------+----------------+     Summary:  Patent arteriovenous fistula.    Medical Decision Making   Timothy Holloway is a 67 y.o. year old male who presents s/p Left arm brachiocephalic fistula creation on 01/11/22 by Dr. Virl Cagey. Incision is well healed. Patent is without signs or symptoms of steal syndrome. He is not currently on Hemodialysis. His duplex today shows patent and well matured brachiocephalic AV fistula.  He will continue to follow up with Dr. Moshe Cipro regarding his renal function The patient's access will be ready for use after 04/12/22 The patient may follow up on a prn basis   Karoline Caldwell, PA-C [facility] Office: [phone]  Clinic MD: Virl Cagey

## 2022-02-21 ENCOUNTER — Encounter (HOSPITAL_COMMUNITY): Payer: Self-pay

## 2022-02-22 ENCOUNTER — Other Ambulatory Visit (HOSPITAL_COMMUNITY): Payer: Self-pay

## 2022-02-25 ENCOUNTER — Encounter (HOSPITAL_COMMUNITY)
Admission: RE | Admit: 2022-02-25 | Discharge: 2022-02-25 | Disposition: A | Discharge status: Home / Self Care | Payer: BC Managed Care – PPO | Source: Ambulatory Visit | Attending: Nephrology | Admitting: Nephrology

## 2022-02-25 VITALS — BP 161/65 | HR 68 | Temp 97.7°F | Resp 18

## 2022-02-25 DIAGNOSIS — D631 Anemia in chronic kidney disease: Secondary | ICD-10-CM

## 2022-02-25 DIAGNOSIS — N184 Chronic kidney disease, stage 4 (severe): Secondary | ICD-10-CM | POA: Diagnosis not present

## 2022-02-25 LAB — POCT HEMOGLOBIN-HEMACUE: Hemoglobin: 8.7 g/dL — ABNORMAL LOW (ref 13.0–17.0)

## 2022-02-25 MED ORDER — EPOETIN ALFA-EPBX 10000 UNIT/ML IJ SOLN
20000.0000 [IU] | INTRAMUSCULAR | Status: DC
  Administered 2022-02-25: 20000 [IU] via SUBCUTANEOUS

## 2022-02-25 MED ORDER — EPOETIN ALFA-EPBX 10000 UNIT/ML IJ SOLN
INTRAMUSCULAR | Status: AC
  Filled 2022-02-25: qty 2

## 2022-03-11 ENCOUNTER — Encounter (HOSPITAL_COMMUNITY)
Admission: RE | Admit: 2022-03-11 | Discharge: 2022-03-11 | Disposition: A | Discharge status: Home / Self Care | Payer: BC Managed Care – PPO | Source: Ambulatory Visit | Attending: Nephrology | Admitting: Nephrology

## 2022-03-11 VITALS — BP 150/55 | HR 60 | Temp 97.4°F | Resp 17

## 2022-03-11 DIAGNOSIS — D631 Anemia in chronic kidney disease: Secondary | ICD-10-CM | POA: Diagnosis present

## 2022-03-11 DIAGNOSIS — N184 Chronic kidney disease, stage 4 (severe): Secondary | ICD-10-CM | POA: Insufficient documentation

## 2022-03-11 LAB — POCT HEMOGLOBIN-HEMACUE: Hemoglobin: 9.1 g/dL — ABNORMAL LOW (ref 13.0–17.0)

## 2022-03-11 MED ORDER — SODIUM CHLORIDE 0.9 % IV SOLN
510.0000 mg | INTRAVENOUS | Status: DC
  Administered 2022-03-11: 510 mg via INTRAVENOUS
  Filled 2022-03-11: qty 17

## 2022-03-11 MED ORDER — EPOETIN ALFA-EPBX 10000 UNIT/ML IJ SOLN
INTRAMUSCULAR | Status: AC
  Administered 2022-03-11: 20000 [IU] via SUBCUTANEOUS
  Filled 2022-03-11: qty 2

## 2022-03-11 MED ORDER — EPOETIN ALFA-EPBX 10000 UNIT/ML IJ SOLN: 20000.0000 [IU] | INTRAMUSCULAR | Status: DC

## 2022-03-25 ENCOUNTER — Encounter (HOSPITAL_COMMUNITY)
Admission: RE | Admit: 2022-03-25 | Discharge: 2022-03-25 | Disposition: A | Discharge status: Home / Self Care | Payer: BC Managed Care – PPO | Source: Ambulatory Visit | Attending: Nephrology | Admitting: Nephrology

## 2022-03-25 VITALS — BP 158/66 | HR 55 | Temp 97.4°F | Resp 17

## 2022-03-25 DIAGNOSIS — N184 Chronic kidney disease, stage 4 (severe): Secondary | ICD-10-CM

## 2022-03-25 LAB — POCT HEMOGLOBIN-HEMACUE: Hemoglobin: 9 g/dL — ABNORMAL LOW (ref 13.0–17.0)

## 2022-03-25 MED ORDER — SODIUM CHLORIDE 0.9 % IV SOLN
510.0000 mg | INTRAVENOUS | Status: AC
  Administered 2022-03-25: 510 mg via INTRAVENOUS
  Filled 2022-03-25: qty 510

## 2022-03-25 MED ORDER — EPOETIN ALFA-EPBX 10000 UNIT/ML IJ SOLN: 20000.0000 [IU] | INTRAMUSCULAR | Status: DC

## 2022-03-25 MED ORDER — EPOETIN ALFA-EPBX 40000 UNIT/ML IJ SOLN
INTRAMUSCULAR | Status: AC
  Administered 2022-03-25: 20000 [IU] via SUBCUTANEOUS
  Filled 2022-03-25: qty 1

## 2022-04-08 ENCOUNTER — Encounter (HOSPITAL_COMMUNITY): Payer: BC Managed Care – PPO

## 2022-04-08 ENCOUNTER — Encounter (HOSPITAL_COMMUNITY)
Admission: RE | Admit: 2022-04-08 | Discharge: 2022-04-08 | Disposition: A | Discharge status: Home / Self Care | Payer: BC Managed Care – PPO | Source: Ambulatory Visit | Attending: Nephrology | Admitting: Nephrology

## 2022-04-08 VITALS — BP 180/55 | HR 69 | Temp 97.0°F | Resp 19

## 2022-04-08 DIAGNOSIS — N184 Chronic kidney disease, stage 4 (severe): Secondary | ICD-10-CM | POA: Insufficient documentation

## 2022-04-08 DIAGNOSIS — D631 Anemia in chronic kidney disease: Secondary | ICD-10-CM | POA: Diagnosis present

## 2022-04-08 LAB — RENAL FUNCTION PANEL
Albumin: 3 g/dL — ABNORMAL LOW (ref 3.5–5.0)
Anion gap: 10 (ref 5–15)
BUN: 99 mg/dL — ABNORMAL HIGH (ref 8–23)
CO2: 23 mmol/L (ref 22–32)
Calcium: 8.7 mg/dL — ABNORMAL LOW (ref 8.9–10.3)
Chloride: 105 mmol/L (ref 98–111)
Creatinine, Ser: 4.96 mg/dL — ABNORMAL HIGH (ref 0.61–1.24)
GFR, Estimated: 12 mL/min — ABNORMAL LOW (ref >60)
Glucose, Bld: 184 mg/dL — ABNORMAL HIGH (ref 70–99)
Phosphorus: 6.6 mg/dL — ABNORMAL HIGH (ref 2.5–4.6)
Potassium: 4.1 mmol/L (ref 3.5–5.1)
Sodium: 138 mmol/L (ref 135–145)

## 2022-04-08 LAB — IRON AND TIBC
Iron: 66 ug/dL (ref 45–182)
Saturation Ratios: 20 % (ref 17.9–39.5)
TIBC: 336 ug/dL (ref 250–450)
UIBC: 270 ug/dL

## 2022-04-08 LAB — FERRITIN: Ferritin: 1293 ng/mL — ABNORMAL HIGH (ref 24–336)

## 2022-04-08 LAB — POCT HEMOGLOBIN-HEMACUE: Hemoglobin: 9.6 g/dL — ABNORMAL LOW (ref 13.0–17.0)

## 2022-04-08 MED ORDER — EPOETIN ALFA-EPBX 10000 UNIT/ML IJ SOLN: 20000.0000 [IU] | INTRAMUSCULAR | Status: DC

## 2022-04-08 MED ORDER — EPOETIN ALFA-EPBX 40000 UNIT/ML IJ SOLN
INTRAMUSCULAR | Status: AC
  Administered 2022-04-08: 20000 [IU] via SUBCUTANEOUS
  Filled 2022-04-08: qty 1

## 2022-04-11 LAB — PTH, INTACT AND CALCIUM
Calcium, Total (PTH): 8.6 mg/dL (ref 8.6–10.2)
PTH: 108 pg/mL — ABNORMAL HIGH (ref 15–65)

## 2022-04-22 ENCOUNTER — Encounter (HOSPITAL_COMMUNITY)
Admission: RE | Admit: 2022-04-22 | Discharge: 2022-04-22 | Disposition: A | Discharge status: Home / Self Care | Payer: BC Managed Care – PPO | Source: Ambulatory Visit | Attending: Nephrology | Admitting: Nephrology

## 2022-04-22 VITALS — BP 165/76 | HR 66 | Temp 97.8°F | Resp 18

## 2022-04-22 DIAGNOSIS — N184 Chronic kidney disease, stage 4 (severe): Secondary | ICD-10-CM | POA: Diagnosis not present

## 2022-04-22 DIAGNOSIS — D631 Anemia in chronic kidney disease: Secondary | ICD-10-CM

## 2022-04-22 LAB — POCT HEMOGLOBIN-HEMACUE: Hemoglobin: 10.2 g/dL — ABNORMAL LOW (ref 13.0–17.0)

## 2022-04-22 MED ORDER — EPOETIN ALFA-EPBX 10000 UNIT/ML IJ SOLN
INTRAMUSCULAR | Status: AC
  Administered 2022-04-22: 20000 [IU] via SUBCUTANEOUS
  Filled 2022-04-22: qty 2

## 2022-04-22 MED ORDER — EPOETIN ALFA-EPBX 10000 UNIT/ML IJ SOLN: 20000.0000 [IU] | INTRAMUSCULAR | Status: DC

## 2022-05-06 ENCOUNTER — Encounter (HOSPITAL_COMMUNITY)
Admission: RE | Admit: 2022-05-06 | Discharge: 2022-05-06 | Disposition: A | Discharge status: Home / Self Care | Payer: BC Managed Care – PPO | Source: Ambulatory Visit | Attending: Nephrology | Admitting: Nephrology

## 2022-05-06 VITALS — BP 167/60 | HR 64 | Temp 97.8°F | Resp 17

## 2022-05-06 DIAGNOSIS — D631 Anemia in chronic kidney disease: Secondary | ICD-10-CM

## 2022-05-06 DIAGNOSIS — N184 Chronic kidney disease, stage 4 (severe): Secondary | ICD-10-CM | POA: Insufficient documentation

## 2022-05-06 LAB — RENAL FUNCTION PANEL
Albumin: 3 g/dL — ABNORMAL LOW (ref 3.5–5.0)
Anion gap: 16 — ABNORMAL HIGH (ref 5–15)
BUN: 103 mg/dL — ABNORMAL HIGH (ref 8–23)
CO2: 22 mmol/L (ref 22–32)
Calcium: 8.7 mg/dL — ABNORMAL LOW (ref 8.9–10.3)
Chloride: 100 mmol/L (ref 98–111)
Creatinine, Ser: 5.85 mg/dL — ABNORMAL HIGH (ref 0.61–1.24)
GFR, Estimated: 10 mL/min — ABNORMAL LOW (ref >60)
Glucose, Bld: 279 mg/dL — ABNORMAL HIGH (ref 70–99)
Phosphorus: 7 mg/dL — ABNORMAL HIGH (ref 2.5–4.6)
Potassium: 4.4 mmol/L (ref 3.5–5.1)
Sodium: 138 mmol/L (ref 135–145)

## 2022-05-06 LAB — POCT HEMOGLOBIN-HEMACUE: Hemoglobin: 10.5 g/dL — ABNORMAL LOW (ref 13.0–17.0)

## 2022-05-06 LAB — IRON AND TIBC
Iron: 98 ug/dL (ref 45–182)
Saturation Ratios: 28 % (ref 17.9–39.5)
TIBC: 356 ug/dL (ref 250–450)
UIBC: 258 ug/dL

## 2022-05-06 LAB — FERRITIN: Ferritin: 1067 ng/mL — ABNORMAL HIGH (ref 24–336)

## 2022-05-06 MED ORDER — EPOETIN ALFA-EPBX 10000 UNIT/ML IJ SOLN: 20000.0000 [IU] | INTRAMUSCULAR | Status: DC

## 2022-05-06 MED ORDER — EPOETIN ALFA-EPBX 10000 UNIT/ML IJ SOLN
INTRAMUSCULAR | Status: AC
  Administered 2022-05-06: 20000 [IU] via SUBCUTANEOUS
  Filled 2022-05-06: qty 2

## 2022-05-07 LAB — PTH, INTACT AND CALCIUM
Calcium, Total (PTH): 8.5 mg/dL — ABNORMAL LOW (ref 8.6–10.2)
PTH: 192 pg/mL — ABNORMAL HIGH (ref 15–65)

## 2022-05-20 ENCOUNTER — Encounter (HOSPITAL_COMMUNITY): Payer: BC Managed Care – PPO

## 2022-06-03 ENCOUNTER — Other Ambulatory Visit (HOSPITAL_COMMUNITY): Payer: Self-pay

## 2022-06-03 ENCOUNTER — Other Ambulatory Visit (HOSPITAL_BASED_OUTPATIENT_CLINIC_OR_DEPARTMENT_OTHER): Payer: Self-pay

## 2022-06-03 ENCOUNTER — Encounter (HOSPITAL_COMMUNITY): Payer: BC Managed Care – PPO

## 2022-06-03 MED ORDER — TRULICITY 3 MG/0.5ML ~~LOC~~ SOAJ
3.0000 mg | SUBCUTANEOUS | 5 refills | Status: DC
  Filled 2022-06-03: qty 2, 28d supply, fill #0
  Filled 2022-06-03: qty 3, 42d supply, fill #0

## 2022-06-07 ENCOUNTER — Other Ambulatory Visit (HOSPITAL_COMMUNITY): Payer: Self-pay

## 2022-06-13 ENCOUNTER — Telehealth: Payer: Self-pay

## 2022-06-13 NOTE — Telephone Encounter (Signed)
Caller: Pt's wife, [name]  Concern: Hand numbness, thumb contracted intermittently, fingers swollen, pain, blisters, black spot underneath fingernail  Location: left arm  Description:  x 2 weeks  Quality: aching, uncomfortable, and variable intensity  Treatments:  Epsom salt soaks  Consulted: McKenzi, PA  Resolution: Appointment scheduled for first available PA triage appt, no studies  Next Appt: Appointment scheduled for 5/14 @ 0915

## 2022-06-14 ENCOUNTER — Other Ambulatory Visit: Payer: Self-pay

## 2022-06-14 ENCOUNTER — Encounter (HOSPITAL_COMMUNITY): Payer: Self-pay | Admitting: Surgery

## 2022-06-14 ENCOUNTER — Ambulatory Visit: Payer: BC Managed Care – PPO | Admitting: Physician Assistant

## 2022-06-14 VITALS — BP 174/90 | HR 60 | Temp 97.8°F | Resp 20 | Ht 72.0 in | Wt 280.3 lb

## 2022-06-14 DIAGNOSIS — Z992 Dependence on renal dialysis: Secondary | ICD-10-CM | POA: Diagnosis not present

## 2022-06-14 DIAGNOSIS — T82898A Other specified complication of vascular prosthetic devices, implants and grafts, initial encounter: Secondary | ICD-10-CM

## 2022-06-14 DIAGNOSIS — N186 End stage renal disease: Secondary | ICD-10-CM

## 2022-06-14 NOTE — H&P (View-Only) (Signed)
Office Note     CC:  follow up Requesting Provider:  [facility]  HPI: Timothy Holloway is a 67 y.o. (1955/11/21) male who presents for evaluation of painful and ulcerated fingers of left hand.  He is status post left brachiocephalic fistula creation by Dr. Karin Lieu in December 2023.  He was initiated on hemodialysis about 6 weeks ago.  Over the past 3 weeks he has had pale and painful fingertips.  It was also around this timeframe that he developed ulcerations of his fingertips.  Patient also states he has had cramping in his left hand since the time of surgery.  Past medical history significant for insulin-dependent diabetes mellitus.  He is dialyzing on a Monday Wednesday Friday schedule.   Past Medical History:  Diagnosis Date   Cataract    Chronic kidney disease (CKD), stage III (moderate) (HCC)    dr Sharl Ma   Diabetes mellitus without complication (HCC)    Hyperlipidemia    Hypertension    Impotence    Insomnia    Obesity     Past Surgical History:  Procedure Laterality Date   AV FISTULA PLACEMENT Left 01/11/2022   Procedure: LEFT ARM ARTERIOVENOUS (AV) FISTULA CREATION;  Surgeon: Victorino Sparrow, MD;  Location: [facility] OR;  Service: Vascular;  Laterality: Left;  PERIPHERAL NERVE BLOCK   KNEE SURGERY Left 01-31-2002   SPINE SURGERY     fusion of L4-L5    Social History   Socioeconomic History   Marital status: Married    Spouse name: Not on file   Number of children: Not on file   Years of education: Not on file   Highest education level: Not on file  Occupational History   Not on file  Tobacco Use   Smoking status: Never    Passive exposure: Never   Smokeless tobacco: Not on file  Substance and Sexual Activity   Alcohol use: No   Drug use: No   Sexual activity: Yes    Comment: not much  Other Topics Concern   Not on file  Social History Narrative   Not on file   Social Determinants of Health   Financial Resource Strain: Not on file  Food  Insecurity: Not on file  Transportation Needs: Not on file  Physical Activity: Not on file  Stress: Not on file  Social Connections: Not on file  Intimate Partner Violence: Not on file    Family History  Problem Relation Age of Onset   Diabetes Mother    Cancer Father        lymphoma    Current Outpatient Medications  Medication Sig Dispense Refill   acetaminophen (TYLENOL) 325 MG tablet Take 650 mg by mouth every 6 (six) hours as needed for moderate pain.     amLODipine (NORVASC) 10 MG tablet Take 1 tablet (10 mg total) by mouth daily. 30 tablet 0   aspirin 81 MG tablet Take 81 mg by mouth daily.     atorvastatin (LIPITOR) 10 MG tablet Take 1 tablet (10 mg total) by mouth daily. (Patient taking differently: Take 10 mg by mouth at bedtime.) 30 tablet 11   betamethasone dipropionate 0.05 % cream Apply 1 Application topically daily as needed (on leg).     carvedilol (COREG) 3.125 MG tablet Take 1 tablet (3.125 mg total) by mouth 2 (two) times daily with a meal. (Patient taking differently: Take 6.25 mg by mouth 2 (two) times daily with a meal.) 60 tablet 0   cephALEXin (  KEFLEX) 500 MG capsule Take 500 mg by mouth 3 (three) times daily.     Cholecalciferol (VITAMIN D) 2000 UNITS CAPS Take 2,000 Units by mouth daily.     cloNIDine (CATAPRES) 0.1 MG tablet Take 0.1 mg by mouth 2 (two) times daily.     co-enzyme Q-10 30 MG capsule Take 30 mg by mouth daily.     diclofenac Sodium (VOLTAREN) 1 % GEL Apply 2 g topically daily as needed (pain).     doxycycline (VIBRAMYCIN) 100 MG capsule Take 100 mg by mouth 2 (two) times daily.     Dulaglutide (TRULICITY) 1.5 MG/0.5ML SOPN Inject 1.5 mg into the skin once a week. Mondays     Dulaglutide (TRULICITY) 3 MG/0.5ML SOPN Inject 3 mg into the skin once a week. 3 mL 5   EPINEPHrine 0.3 mg/0.3 mL IJ SOAJ injection Inject 0.3 mg into the muscle once as needed for anaphylaxis.     escitalopram (LEXAPRO) 10 MG tablet Take 10 mg by mouth daily.     FARXIGA  10 MG TABS tablet Take 10 mg by mouth daily.     fenofibrate (TRICOR) 145 MG tablet Take 1/2 tablet daily (Patient taking differently: Take 72.5 mg by mouth daily.) 30 tablet 0   ferrous sulfate 325 (65 FE) MG tablet Take 325 mg by mouth 2 (two) times daily with a meal.     furosemide (LASIX) 80 MG tablet Take 80 mg by mouth daily.     Glucosamine-Chondroit-Vit C-Mn (GLUCOSAMINE CHONDR 1500 COMPLX) CAPS Take 1 capsule by mouth 2 (two) times daily.     HUMALOG KWIKPEN 200 UNIT/ML KwikPen Inject 0-30 Units into the skin with breakfast, with lunch, and with evening meal.     hydrALAZINE (APRESOLINE) 50 MG tablet Take 1 tablet (50 mg total) by mouth every 8 (eight) hours. 90 tablet 0   hydrochlorothiazide (HYDRODIURIL) 25 MG tablet Take 25 mg by mouth daily.     insulin detemir (LEVEMIR FLEXTOUCH) 100 UNIT/ML FlexPen Inject 50 Units into the skin daily. 15 mL 11   isosorbide mononitrate (IMDUR) 30 MG 24 hr tablet Take 30 mg by mouth daily.     LANTUS SOLOSTAR 100 UNIT/ML Solostar Pen Inject 50 Units into the skin at bedtime.     losartan (COZAAR) 100 MG tablet Take 1 tablet by mouth daily.     meclizine (ANTIVERT) 25 MG tablet Take 25 mg by mouth daily as needed for dizziness.     methocarbamol (ROBAXIN) 500 MG tablet Take 1 tablet by mouth daily as needed for muscle spasms.     Multiple Vitamin (MULTIVITAMIN) tablet Take 1 tablet by mouth daily.     oxyCODONE-acetaminophen (PERCOCET) 5-325 MG tablet Take 1 tablet by mouth every 6 (six) hours as needed for severe pain. 10 tablet 0   sertraline (ZOLOFT) 50 MG tablet Take 1 tablet by mouth daily.     sodium bicarbonate 650 MG tablet Take 1 tablet (650 mg total) by mouth 2 (two) times daily. 60 tablet 0   sodium chloride (OCEAN) 0.65 % SOLN nasal spray Place 1 spray into both nostrils daily as needed for congestion.     zolpidem (AMBIEN) 10 MG tablet Take 1 tablet (10 mg total) by mouth at bedtime as needed for sleep. 30 tablet 2   No current  facility-administered medications for this visit.    Allergies  Allergen Reactions   Latex Hives   Lodine [Etodolac] Nausea And Vomiting     REVIEW OF SYSTEMS:   [  X] denotes positive finding, [ ]  denotes negative finding Cardiac  Comments:  Chest pain or chest pressure:    Shortness of breath upon exertion:    Short of breath when lying flat:    Irregular heart rhythm:        Vascular    Pain in calf, thigh, or hip brought on by ambulation:    Pain in feet at night that wakes you up from your sleep:     Blood clot in your veins:    Leg swelling:         Pulmonary    Oxygen at home:    Productive cough:     Wheezing:         Neurologic    Sudden weakness in arms or legs:     Sudden numbness in arms or legs:     Sudden onset of difficulty speaking or slurred speech:    Temporary loss of vision in one eye:     Problems with dizziness:         Gastrointestinal    Blood in stool:     Vomited blood:         Genitourinary    Burning when urinating:     Blood in urine:        Psychiatric    Major depression:         Hematologic    Bleeding problems:    Problems with blood clotting too easily:        Skin    Rashes or ulcers:        Constitutional    Fever or chills:      PHYSICAL EXAMINATION:  Vitals:   06/14/22 0909  BP: (!) 174/90  Pulse: 60  Resp: 20  Temp: 97.8 F (36.6 C)  TempSrc: Temporal  SpO2: 95%  Weight: 280 lb 4.8 oz (127.1 kg)  Height: 6' (1.829 m)    General:  WDWN in NAD; vital signs documented above Gait: Not observed HENT: WNL, normocephalic Pulmonary: normal non-labored breathing , without Rales, rhonchi,  wheezing Cardiac: regular HR Abdomen: soft, NT, no masses Skin: without rashes Vascular Exam/Pulses: Unable to palpate left radial pulse Extremities: Ischemic appearing left index finger with ulcerations involving at least 3 fingers; pain with light touch of the fingertips Musculoskeletal: no muscle wasting or  atrophy  Neurologic: A&O X 3 Psychiatric:  The pt has Normal affect   ASSESSMENT/PLAN:: 67 y.o. male here for follow up for evaluation of painful and ulcerated fingertips of the left hand  -Timothy Holloway is a 66 year old male with insulin-dependent diabetes mellitus and end-stage renal disease on hemodialysis.  Since brachiocephalic fistula creation in the left arm in December he has noticed cramping in his left hand.  Over the past 3 weeks he has developed severe pain with ulcerations to the fingertips of his left hand.  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  Plan will be to ligate left brachiocephalic fistula and place a TDC.  I discussed with the patient and his wife that without ligation of the fistula he will likely require partial amputations of his fingertips.  This will be scheduled with our next available surgeon this week.  The patient and his wife agreed to proceed.  We will discuss right arm dialysis access at a later date.  He also plans to discuss PD catheter with his nephrologist.   Emilie Rutter, PA-C [facility] [phone]  Clinic MD:   Chestine Spore

## 2022-06-14 NOTE — Progress Notes (Signed)
Office Note     CC:  follow up Requesting Provider:  [facility]  HPI: Timothy Holloway is a 67 y.o. (08/21/1955) male who presents for evaluation of painful and ulcerated fingers of left hand.  He is status post left brachiocephalic fistula creation by Dr. Robins in December 2023.  He was initiated on hemodialysis about 6 weeks ago.  Over the past 3 weeks he has had pale and painful fingertips.  It was also around this timeframe that he developed ulcerations of his fingertips.  Patient also states he has had cramping in his left hand since the time of surgery.  Past medical history significant for insulin-dependent diabetes mellitus.  He is dialyzing on a Monday Wednesday Friday schedule.   Past Medical History:  Diagnosis Date   Cataract    Chronic kidney disease (CKD), stage III (moderate) (HCC)    dr Kerr   Diabetes mellitus without complication (HCC)    Hyperlipidemia    Hypertension    Impotence    Insomnia    Obesity     Past Surgical History:  Procedure Laterality Date   AV FISTULA PLACEMENT Left 01/11/2022   Procedure: LEFT ARM ARTERIOVENOUS (AV) FISTULA CREATION;  Surgeon: Robins, Joshua E, MD;  Location: [facility] OR;  Service: Vascular;  Laterality: Left;  PERIPHERAL NERVE BLOCK   KNEE SURGERY Left 01-31-2002   SPINE SURGERY     fusion of L4-L5    Social History   Socioeconomic History   Marital status: Married    Spouse name: Not on file   Number of children: Not on file   Years of education: Not on file   Highest education level: Not on file  Occupational History   Not on file  Tobacco Use   Smoking status: Never    Passive exposure: Never   Smokeless tobacco: Not on file  Substance and Sexual Activity   Alcohol use: No   Drug use: No   Sexual activity: Yes    Comment: not much  Other Topics Concern   Not on file  Social History Narrative   Not on file   Social Determinants of Health   Financial Resource Strain: Not on file  Food  Insecurity: Not on file  Transportation Needs: Not on file  Physical Activity: Not on file  Stress: Not on file  Social Connections: Not on file  Intimate Partner Violence: Not on file    Family History  Problem Relation Age of Onset   Diabetes Mother    Cancer Father        lymphoma    Current Outpatient Medications  Medication Sig Dispense Refill   acetaminophen (TYLENOL) 325 MG tablet Take 650 mg by mouth every 6 (six) hours as needed for moderate pain.     amLODipine (NORVASC) 10 MG tablet Take 1 tablet (10 mg total) by mouth daily. 30 tablet 0   aspirin 81 MG tablet Take 81 mg by mouth daily.     atorvastatin (LIPITOR) 10 MG tablet Take 1 tablet (10 mg total) by mouth daily. (Patient taking differently: Take 10 mg by mouth at bedtime.) 30 tablet 11   betamethasone dipropionate 0.05 % cream Apply 1 Application topically daily as needed (on leg).     carvedilol (COREG) 3.125 MG tablet Take 1 tablet (3.125 mg total) by mouth 2 (two) times daily with a meal. (Patient taking differently: Take 6.25 mg by mouth 2 (two) times daily with a meal.) 60 tablet 0   cephALEXin (  KEFLEX) 500 MG capsule Take 500 mg by mouth 3 (three) times daily.     Cholecalciferol (VITAMIN D) 2000 UNITS CAPS Take 2,000 Units by mouth daily.     cloNIDine (CATAPRES) 0.1 MG tablet Take 0.1 mg by mouth 2 (two) times daily.     co-enzyme Q-10 30 MG capsule Take 30 mg by mouth daily.     diclofenac Sodium (VOLTAREN) 1 % GEL Apply 2 g topically daily as needed (pain).     doxycycline (VIBRAMYCIN) 100 MG capsule Take 100 mg by mouth 2 (two) times daily.     Dulaglutide (TRULICITY) 1.5 MG/0.5ML SOPN Inject 1.5 mg into the skin once a week. Mondays     Dulaglutide (TRULICITY) 3 MG/0.5ML SOPN Inject 3 mg into the skin once a week. 3 mL 5   EPINEPHrine 0.3 mg/0.3 mL IJ SOAJ injection Inject 0.3 mg into the muscle once as needed for anaphylaxis.     escitalopram (LEXAPRO) 10 MG tablet Take 10 mg by mouth daily.     FARXIGA  10 MG TABS tablet Take 10 mg by mouth daily.     fenofibrate (TRICOR) 145 MG tablet Take 1/2 tablet daily (Patient taking differently: Take 72.5 mg by mouth daily.) 30 tablet 0   ferrous sulfate 325 (65 FE) MG tablet Take 325 mg by mouth 2 (two) times daily with a meal.     furosemide (LASIX) 80 MG tablet Take 80 mg by mouth daily.     Glucosamine-Chondroit-Vit C-Mn (GLUCOSAMINE CHONDR 1500 COMPLX) CAPS Take 1 capsule by mouth 2 (two) times daily.     HUMALOG KWIKPEN 200 UNIT/ML KwikPen Inject 0-30 Units into the skin with breakfast, with lunch, and with evening meal.     hydrALAZINE (APRESOLINE) 50 MG tablet Take 1 tablet (50 mg total) by mouth every 8 (eight) hours. 90 tablet 0   hydrochlorothiazide (HYDRODIURIL) 25 MG tablet Take 25 mg by mouth daily.     insulin detemir (LEVEMIR FLEXTOUCH) 100 UNIT/ML FlexPen Inject 50 Units into the skin daily. 15 mL 11   isosorbide mononitrate (IMDUR) 30 MG 24 hr tablet Take 30 mg by mouth daily.     LANTUS SOLOSTAR 100 UNIT/ML Solostar Pen Inject 50 Units into the skin at bedtime.     losartan (COZAAR) 100 MG tablet Take 1 tablet by mouth daily.     meclizine (ANTIVERT) 25 MG tablet Take 25 mg by mouth daily as needed for dizziness.     methocarbamol (ROBAXIN) 500 MG tablet Take 1 tablet by mouth daily as needed for muscle spasms.     Multiple Vitamin (MULTIVITAMIN) tablet Take 1 tablet by mouth daily.     oxyCODONE-acetaminophen (PERCOCET) 5-325 MG tablet Take 1 tablet by mouth every 6 (six) hours as needed for severe pain. 10 tablet 0   sertraline (ZOLOFT) 50 MG tablet Take 1 tablet by mouth daily.     sodium bicarbonate 650 MG tablet Take 1 tablet (650 mg total) by mouth 2 (two) times daily. 60 tablet 0   sodium chloride (OCEAN) 0.65 % SOLN nasal spray Place 1 spray into both nostrils daily as needed for congestion.     zolpidem (AMBIEN) 10 MG tablet Take 1 tablet (10 mg total) by mouth at bedtime as needed for sleep. 30 tablet 2   No current  facility-administered medications for this visit.    Allergies  Allergen Reactions   Latex Hives   Lodine [Etodolac] Nausea And Vomiting     REVIEW OF SYSTEMS:   [  X] denotes positive finding, [ ] denotes negative finding Cardiac  Comments:  Chest pain or chest pressure:    Shortness of breath upon exertion:    Short of breath when lying flat:    Irregular heart rhythm:        Vascular    Pain in calf, thigh, or hip brought on by ambulation:    Pain in feet at night that wakes you up from your sleep:     Blood clot in your veins:    Leg swelling:         Pulmonary    Oxygen at home:    Productive cough:     Wheezing:         Neurologic    Sudden weakness in arms or legs:     Sudden numbness in arms or legs:     Sudden onset of difficulty speaking or slurred speech:    Temporary loss of vision in one eye:     Problems with dizziness:         Gastrointestinal    Blood in stool:     Vomited blood:         Genitourinary    Burning when urinating:     Blood in urine:        Psychiatric    Major depression:         Hematologic    Bleeding problems:    Problems with blood clotting too easily:        Skin    Rashes or ulcers:        Constitutional    Fever or chills:      PHYSICAL EXAMINATION:  Vitals:   06/14/22 0909  BP: (!) 174/90  Pulse: 60  Resp: 20  Temp: 97.8 F (36.6 C)  TempSrc: Temporal  SpO2: 95%  Weight: 280 lb 4.8 oz (127.1 kg)  Height: 6' (1.829 m)    General:  WDWN in NAD; vital signs documented above Gait: Not observed HENT: WNL, normocephalic Pulmonary: normal non-labored breathing , without Rales, rhonchi,  wheezing Cardiac: regular HR Abdomen: soft, NT, no masses Skin: without rashes Vascular Exam/Pulses: Unable to palpate left radial pulse Extremities: Ischemic appearing left index finger with ulcerations involving at least 3 fingers; pain with light touch of the fingertips Musculoskeletal: no muscle wasting or  atrophy  Neurologic: A&O X 3 Psychiatric:  The pt has Normal affect   ASSESSMENT/PLAN:: 67 y.o. male here for follow up for evaluation of painful and ulcerated fingertips of the left hand  -Mr. Trea Seaman is a 67-year-old male with insulin-dependent diabetes mellitus and end-stage renal disease on hemodialysis.  Since brachiocephalic fistula creation in the left arm in December he has noticed cramping in his left hand.  Over the past 3 weeks he has developed severe pain with ulcerations to the fingertips of his left hand.  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  Plan will be to ligate left brachiocephalic fistula and place a TDC.  I discussed with the patient and his wife that without ligation of the fistula he will likely require partial amputations of his fingertips.  This will be scheduled with our next available surgeon this week.  The patient and his wife agreed to proceed.  We will discuss right arm dialysis access at a later date.  He also plans to discuss PD catheter with his nephrologist.   Felma Pfefferle, PA-C [facility] [phone]  Clinic MD:   [place]  

## 2022-06-15 ENCOUNTER — Other Ambulatory Visit: Payer: Self-pay

## 2022-06-15 ENCOUNTER — Encounter (HOSPITAL_COMMUNITY): Payer: Self-pay | Admitting: Surgery

## 2022-06-15 NOTE — Progress Notes (Signed)
SDW CALL  Patient was given pre-op instructions over the phone. Patient verbalized understanding of instructions provided.   PCP - [facility], Dr. Karna Dupes Cardiologist - denies  Chest x-ray - 05/13/22 EKG - 07/21/21 Stress Test -  ECHO - denies Cardiac Cath - denies  Sleep Study -  CPAP -   Fasting Blood Sugar - 90-100 Checks Blood Sugar daily  Last Dose Trulicity 5/8 Last Dose Farxiga 5/14 See pre-op call flowsheet for insulin instructions, pt verbalized understanding. Pt still needs med rec, but current insulin prescriptions reviewed.  Blood Thinner Instructions: Aspirin Instructions: Takes ASA at night, 5/15 will be last dose  ERAS Protcol - NPO PRE-SURGERY Ensure or G2-   COVID TEST- n/a   Anesthesia review: n/a  Patient denies shortness of breath, fever, cough and chest pain over the phone call

## 2022-06-15 NOTE — Progress Notes (Signed)
Anesthesia Chart Review: Maury Dus  Case: [id number] Date/Time: 06/16/22 0908   Procedures:      LEFT ARM FISTULA LIGATION (Left)     INSERTION OF TUNNELED DIALYSIS CATHETER   Anesthesia type: Choice   Pre-op diagnosis: Steal syndrome   Location: [facility] ROOM 11 / [facility]   Surgeons: Nada Libman, MD       DISCUSSION: Patient is a 67 year old male scheduled for the above procedure. He is s/p left brachiocephalic AVF on 01/11/22.  He was started on hemodialysis about 6 weeks ago, and over the past few weeks and noted pale, painful fingertips with development of ulcerations. He was referred back to [facility] and seen on 06/14/22 with ligation of AVF recommended for Steal Syndrome. He will also need placement of a [facility] for HD access. He dialyzes on MWF.   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   In late March he was treated for sinusitis with Augmentin and Tessalon Perles. CXR 05/13/22 suggested possible right basilar pneumonia, so treated with azithromycin, albuterol, and prednisone taper.   He reported last Farxiga 06/14/22 and last Trulicity 06/08/22. A1c 7.0% on 04/28/22. I think last EKG from 07/21/21 showed sinus arrhythmia with first degree AV block. He is I> 1 month out from possible CAP. Now with Steal Syndrome and needed AVF ligated. Anesthesia team to evaluate on the day of surgery.    VS:  BP Readings from Last 3 Encounters:  06/14/22 (!) 174/90  05/06/22 (!) 167/60  04/22/22 (!) 165/76   Pulse Readings from Last 3 Encounters:  06/14/22 60  05/06/22 64  04/22/22 66     PROVIDERS: [facility], [facility] Annie Sable, MD is nephrologist - He is not routinely followed by cardiology. He saw Vilinda Boehringer, MD in 2017 for DOE, edema. Echo on 04/24/15 showed LVEF 60-65%, mild LVH, abnormal diastolic function, mildly dilated LA, trace MR, mild TR, RVSP 30-40 mmHg.   LABS:  For day of surgery. As of 05/06/22 HGB 10.5. A1c 7.0% on 04/28/22 ([facility]).    IMAGES: CXR 05/13/22 ([facility]): IMPRESSION: Possible right basilar opacity posterior medially could represent pneumonia as clinically reported. Recommend imaging follow-up to resolution.    EKG: EKG 07/21/21 (done in ED during admission for acute on chronic renal failure) has a reading of atrial fibrillation, incomplete left bundle branch block. I think there are actual p waves with prolonged PR interval consistent with prior tracing from 09/10/20. PR interval is quite prolonged 360 mm. Will defer to assigned anesthesiologist if he/she wants to update EKG prior to surgery.    CV: Echo 04/24/15 ([facility], scanned under Media tab, Correspondence 01/13/23): Summary: The left ventricle is normal in size.  There is mild concentric left ventricular hypertrophy with normal wall motion and ejection fraction 60 to 65%. The left ventricular diastolic function is abnormal. The left atrium is mildly dilated. There is trace mitral regurgitation. There is mild 1+ tricuspid regurgitation.  Right ventricular systolic pressure is elevated between 30 and 40 mmHg, consistent with mild pulmonary hypertension. The aortic valve is trileaflet. There is mild aortic valve thickening with no regurgitation.   Past Medical History:  Diagnosis Date   Cataract    Chronic kidney disease (CKD), stage III (moderate) (HCC)    dr Sharl Ma   Depression    Diabetes mellitus without complication (HCC)    ESRD on dialysis (HCC)    M-W-F  Hyperlipidemia    Hypertension    Impotence    Insomnia    Obesity    Steal syndrome as complication of dialysis access [facility])     Past Surgical History:  Procedure Laterality Date   AV FISTULA PLACEMENT Left 01/11/2022   Procedure: LEFT ARM ARTERIOVENOUS (AV) FISTULA CREATION;  Surgeon: Victorino Sparrow, MD;  Location: [facility] OR;  Service: Vascular;  Laterality: Left;  PERIPHERAL NERVE BLOCK   KNEE SURGERY Left  01-31-2002   SPINE SURGERY     fusion of L4-L5    MEDICATIONS: No current facility-administered medications for this encounter.    acetaminophen (TYLENOL) 325 MG tablet   amLODipine (NORVASC) 10 MG tablet   aspirin 81 MG tablet   atorvastatin (LIPITOR) 10 MG tablet   betamethasone dipropionate 0.05 % cream   carvedilol (COREG) 3.125 MG tablet   cephALEXin (KEFLEX) 500 MG capsule   Cholecalciferol (VITAMIN D) 2000 UNITS CAPS   cloNIDine (CATAPRES) 0.1 MG tablet   co-enzyme Q-10 30 MG capsule   diclofenac Sodium (VOLTAREN) 1 % GEL   doxycycline (VIBRAMYCIN) 100 MG capsule   Dulaglutide (TRULICITY) 1.5 MG/0.5ML SOPN   Dulaglutide (TRULICITY) 3 MG/0.5ML SOPN   EPINEPHrine 0.3 mg/0.3 mL IJ SOAJ injection   escitalopram (LEXAPRO) 10 MG tablet   FARXIGA 10 MG TABS tablet   fenofibrate (TRICOR) 145 MG tablet   ferrous sulfate 325 (65 FE) MG tablet   furosemide (LASIX) 80 MG tablet   Glucosamine-Chondroit-Vit C-Mn (GLUCOSAMINE CHONDR 1500 COMPLX) CAPS   HUMALOG KWIKPEN 200 UNIT/ML KwikPen   hydrALAZINE (APRESOLINE) 50 MG tablet   hydrochlorothiazide (HYDRODIURIL) 25 MG tablet   insulin detemir (LEVEMIR FLEXTOUCH) 100 UNIT/ML FlexPen   isosorbide mononitrate (IMDUR) 30 MG 24 hr tablet   LANTUS SOLOSTAR 100 UNIT/ML Solostar Pen   losartan (COZAAR) 100 MG tablet   meclizine (ANTIVERT) 25 MG tablet   methocarbamol (ROBAXIN) 500 MG tablet   Multiple Vitamin (MULTIVITAMIN) tablet   oxyCODONE-acetaminophen (PERCOCET) 5-325 MG tablet   sertraline (ZOLOFT) 50 MG tablet   sodium bicarbonate 650 MG tablet   sodium chloride (OCEAN) 0.65 % SOLN nasal spray   zolpidem (AMBIEN) 10 MG tablet     Shonna Chock, PA-C Surgical Short Stay/Anesthesiology [facility] Phone [phone] [facility] Phone [phone] 06/15/2022 12:50 PM

## 2022-06-15 NOTE — Anesthesia Preprocedure Evaluation (Signed)
Anesthesia Evaluation  Patient identified by MRN, date of birth, ID band Patient awake    Reviewed: Allergy & Precautions, NPO status , Patient's Chart, lab work & pertinent test results  Airway Mallampati: II  TM Distance: >3 FB Neck ROM: Full    Dental  (+) Dental Advisory Given   Pulmonary neg pulmonary ROS   breath sounds clear to auscultation       Cardiovascular hypertension, Pt. on medications  Rhythm:Regular Rate:Normal     Neuro/Psych negative neurological ROS     GI/Hepatic negative GI ROS, Neg liver ROS,,,  Endo/Other  diabetes, Type 2    Renal/GU ESRF and DialysisRenal disease     Musculoskeletal   Abdominal   Peds  Hematology  (+) Blood dyscrasia, anemia   Anesthesia Other Findings   Reproductive/Obstetrics                             Anesthesia Physical Anesthesia Plan  ASA: 4  Anesthesia Plan: General   Post-op Pain Management: Tylenol PO (pre-op)*   Induction: Intravenous  PONV Risk Score and Plan: 2 and Dexamethasone, Ondansetron and Treatment may vary due to age or medical condition  Airway Management Planned: LMA  Additional Equipment:   Intra-op Plan:   Post-operative Plan: Extubation in OR  Informed Consent: I have reviewed the patients History and Physical, chart, labs and discussed the procedure including the risks, benefits and alternatives for the proposed anesthesia with the patient or authorized representative who has indicated his/her understanding and acceptance.     Dental advisory given  Plan Discussed with: CRNA  Anesthesia Plan Comments: (  )       Anesthesia Quick Evaluation

## 2022-06-16 ENCOUNTER — Encounter (HOSPITAL_COMMUNITY): Payer: Self-pay | Admitting: Surgery

## 2022-06-16 ENCOUNTER — Ambulatory Visit (HOSPITAL_COMMUNITY): Payer: BC Managed Care – PPO

## 2022-06-16 ENCOUNTER — Encounter (HOSPITAL_COMMUNITY)
Admission: RE | Disposition: A | Discharge status: Home / Self Care | Payer: Self-pay | Source: Home / Self Care | Attending: Surgery

## 2022-06-16 ENCOUNTER — Other Ambulatory Visit: Payer: Self-pay

## 2022-06-16 ENCOUNTER — Ambulatory Visit (HOSPITAL_COMMUNITY): Payer: BC Managed Care – PPO | Admitting: Vascular Surgery

## 2022-06-16 ENCOUNTER — Ambulatory Visit (HOSPITAL_COMMUNITY)
Admission: RE | Admit: 2022-06-16 | Discharge: 2022-06-16 | Disposition: A | Discharge status: Home / Self Care | Payer: BC Managed Care – PPO | Attending: Surgery | Admitting: Surgery

## 2022-06-16 DIAGNOSIS — I120 Hypertensive chronic kidney disease with stage 5 chronic kidney disease or end stage renal disease: Secondary | ICD-10-CM | POA: Insufficient documentation

## 2022-06-16 DIAGNOSIS — T82898A Other specified complication of vascular prosthetic devices, implants and grafts, initial encounter: Secondary | ICD-10-CM

## 2022-06-16 DIAGNOSIS — Z7984 Long term (current) use of oral hypoglycemic drugs: Secondary | ICD-10-CM | POA: Diagnosis not present

## 2022-06-16 DIAGNOSIS — Z794 Long term (current) use of insulin: Secondary | ICD-10-CM | POA: Diagnosis not present

## 2022-06-16 DIAGNOSIS — Y831 Surgical operation with implant of artificial internal device as the cause of abnormal reaction of the patient, or of later complication, without mention of misadventure at the time of the procedure: Secondary | ICD-10-CM | POA: Insufficient documentation

## 2022-06-16 DIAGNOSIS — N186 End stage renal disease: Secondary | ICD-10-CM | POA: Diagnosis not present

## 2022-06-16 DIAGNOSIS — E1122 Type 2 diabetes mellitus with diabetic chronic kidney disease: Secondary | ICD-10-CM | POA: Insufficient documentation

## 2022-06-16 DIAGNOSIS — T82848A Pain from vascular prosthetic devices, implants and grafts, initial encounter: Secondary | ICD-10-CM

## 2022-06-16 DIAGNOSIS — Z992 Dependence on renal dialysis: Secondary | ICD-10-CM | POA: Diagnosis not present

## 2022-06-16 DIAGNOSIS — Z7985 Long-term (current) use of injectable non-insulin antidiabetic drugs: Secondary | ICD-10-CM | POA: Insufficient documentation

## 2022-06-16 HISTORY — DX: Depression, unspecified: F32.A

## 2022-06-16 HISTORY — DX: End stage renal disease: N18.6

## 2022-06-16 HISTORY — PX: INSERTION OF DIALYSIS CATHETER: SHX1324

## 2022-06-16 HISTORY — DX: Other specified complication of vascular prosthetic devices, implants and grafts, initial encounter: T82.898A

## 2022-06-16 HISTORY — PX: LIGATION OF ARTERIOVENOUS  FISTULA: SHX5948

## 2022-06-16 LAB — GLUCOSE, CAPILLARY
Glucose-Capillary: 117 mg/dL — ABNORMAL HIGH (ref 70–99)
Glucose-Capillary: 116 mg/dL — ABNORMAL HIGH (ref 70–99)
Glucose-Capillary: 101 mg/dL — ABNORMAL HIGH (ref 70–99)

## 2022-06-16 LAB — POCT I-STAT, CHEM 8
BUN: 35 mg/dL — ABNORMAL HIGH (ref 8–23)
Calcium, Ion: 1.07 mmol/L — ABNORMAL LOW (ref 1.15–1.40)
Chloride: 98 mmol/L (ref 98–111)
Creatinine, Ser: 4.2 mg/dL — ABNORMAL HIGH (ref 0.61–1.24)
Glucose, Bld: 130 mg/dL — ABNORMAL HIGH (ref 70–99)
HCT: 24 % — ABNORMAL LOW (ref 39.0–52.0)
Hemoglobin: 8.2 g/dL — ABNORMAL LOW (ref 13.0–17.0)
Potassium: 3.5 mmol/L (ref 3.5–5.1)
Sodium: 138 mmol/L (ref 135–145)
TCO2: 26 mmol/L (ref 22–32)

## 2022-06-16 SURGERY — LIGATION OF ARTERIOVENOUS  FISTULA
Anesthesia: General | Site: Neck | Laterality: Right

## 2022-06-16 MED ORDER — ORAL CARE MOUTH RINSE: 15.0000 mL | Freq: Once | OROMUCOSAL | Status: AC

## 2022-06-16 MED ORDER — LIDOCAINE 2% (20 MG/ML) 5 ML SYRINGE
INTRAMUSCULAR | Status: DC | PRN
  Administered 2022-06-16: 60 mg via INTRAVENOUS

## 2022-06-16 MED ORDER — LIDOCAINE 2% (20 MG/ML) 5 ML SYRINGE
INTRAMUSCULAR | Status: AC
  Filled 2022-06-16: qty 5

## 2022-06-16 MED ORDER — HEPARIN SODIUM (PORCINE) 1000 UNIT/ML IJ SOLN
INTRAMUSCULAR | Status: AC
  Filled 2022-06-16: qty 10

## 2022-06-16 MED ORDER — LIDOCAINE-EPINEPHRINE (PF) 1 %-1:200000 IJ SOLN
INTRAMUSCULAR | Status: AC
  Filled 2022-06-16: qty 30

## 2022-06-16 MED ORDER — PROPOFOL 10 MG/ML IV BOLUS
INTRAVENOUS | Status: AC
  Filled 2022-06-16: qty 20

## 2022-06-16 MED ORDER — HYDRALAZINE HCL 20 MG/ML IJ SOLN
10.0000 mg | Freq: Once | INTRAMUSCULAR | Status: AC
  Administered 2022-06-16: 10 mg via INTRAVENOUS

## 2022-06-16 MED ORDER — DEXAMETHASONE SODIUM PHOSPHATE 10 MG/ML IJ SOLN
INTRAMUSCULAR | Status: DC | PRN
  Administered 2022-06-16: 5 mg via INTRAVENOUS

## 2022-06-16 MED ORDER — FENTANYL CITRATE (PF) 100 MCG/2ML IJ SOLN
INTRAMUSCULAR | Status: AC
  Filled 2022-06-16: qty 2

## 2022-06-16 MED ORDER — PROPOFOL 10 MG/ML IV BOLUS
INTRAVENOUS | Status: DC | PRN
  Administered 2022-06-16: 150 mg via INTRAVENOUS

## 2022-06-16 MED ORDER — GLYCOPYRROLATE PF 0.2 MG/ML IJ SOSY
PREFILLED_SYRINGE | INTRAMUSCULAR | Status: DC | PRN
  Administered 2022-06-16: .2 mg via INTRAVENOUS

## 2022-06-16 MED ORDER — SODIUM CHLORIDE 0.9 % IV SOLN: INTRAVENOUS | Status: DC

## 2022-06-16 MED ORDER — EPHEDRINE SULFATE-NACL 50-0.9 MG/10ML-% IV SOSY
PREFILLED_SYRINGE | INTRAVENOUS | Status: DC | PRN
  Administered 2022-06-16: 10 mg via INTRAVENOUS
  Administered 2022-06-16 (×4): 5 mg via INTRAVENOUS
  Administered 2022-06-16: 10 mg via INTRAVENOUS

## 2022-06-16 MED ORDER — DEXAMETHASONE SODIUM PHOSPHATE 10 MG/ML IJ SOLN
INTRAMUSCULAR | Status: AC
  Filled 2022-06-16: qty 1

## 2022-06-16 MED ORDER — ACETAMINOPHEN 500 MG PO TABS
1000.0000 mg | ORAL_TABLET | Freq: Once | ORAL | Status: AC
  Administered 2022-06-16: 1000 mg via ORAL

## 2022-06-16 MED ORDER — ONDANSETRON HCL 4 MG/2ML IJ SOLN
INTRAMUSCULAR | Status: AC
  Filled 2022-06-16: qty 2

## 2022-06-16 MED ORDER — PHENYLEPHRINE 80 MCG/ML (10ML) SYRINGE FOR IV PUSH (FOR BLOOD PRESSURE SUPPORT)
PREFILLED_SYRINGE | INTRAVENOUS | Status: AC
  Filled 2022-06-16: qty 10

## 2022-06-16 MED ORDER — LIDOCAINE HCL (PF) 1 % IJ SOLN
INTRAMUSCULAR | Status: AC
  Filled 2022-06-16: qty 30

## 2022-06-16 MED ORDER — PROMETHAZINE HCL 25 MG/ML IJ SOLN: 6.2500 mg | INTRAMUSCULAR | Status: DC | PRN

## 2022-06-16 MED ORDER — INSULIN ASPART 100 UNIT/ML IJ SOLN: 0.0000 [IU] | INTRAMUSCULAR | Status: DC | PRN

## 2022-06-16 MED ORDER — OXYCODONE-ACETAMINOPHEN 5-325 MG PO TABS: 1.0000 | ORAL_TABLET | Freq: Four times a day (QID) | ORAL | 0 refills | Status: DC | PRN

## 2022-06-16 MED ORDER — HYDRALAZINE HCL 20 MG/ML IJ SOLN
INTRAMUSCULAR | Status: AC
  Filled 2022-06-16: qty 1

## 2022-06-16 MED ORDER — FENTANYL CITRATE (PF) 250 MCG/5ML IJ SOLN
INTRAMUSCULAR | Status: DC | PRN
  Administered 2022-06-16 (×2): 25 ug via INTRAVENOUS

## 2022-06-16 MED ORDER — CHLORHEXIDINE GLUCONATE 4 % EX SOLN: 60.0000 mL | Freq: Once | CUTANEOUS | Status: DC

## 2022-06-16 MED ORDER — ALBUMIN HUMAN 5 % IV SOLN: INTRAVENOUS | Status: DC | PRN

## 2022-06-16 MED ORDER — ONDANSETRON HCL 4 MG/2ML IJ SOLN
INTRAMUSCULAR | Status: DC | PRN
  Administered 2022-06-16: 4 mg via INTRAVENOUS

## 2022-06-16 MED ORDER — MIDAZOLAM HCL 2 MG/2ML IJ SOLN
INTRAMUSCULAR | Status: DC | PRN
  Administered 2022-06-16 (×2): 1 mg via INTRAVENOUS

## 2022-06-16 MED ORDER — HEPARIN SODIUM (PORCINE) 1000 UNIT/ML IJ SOLN
INTRAMUSCULAR | Status: DC | PRN
  Administered 2022-06-16: 3.8 [IU] via INTRAVENOUS

## 2022-06-16 MED ORDER — MIDAZOLAM HCL 2 MG/2ML IJ SOLN
INTRAMUSCULAR | Status: AC
  Filled 2022-06-16: qty 2

## 2022-06-16 MED ORDER — FENTANYL CITRATE (PF) 100 MCG/2ML IJ SOLN
25.0000 ug | INTRAMUSCULAR | Status: DC | PRN
  Administered 2022-06-16: 25 ug via INTRAVENOUS
  Administered 2022-06-16: 50 ug via INTRAVENOUS

## 2022-06-16 MED ORDER — HEPARIN 6000 UNIT IRRIGATION SOLUTION
Status: DC | PRN
  Administered 2022-06-16: 1

## 2022-06-16 MED ORDER — CHLORHEXIDINE GLUCONATE 0.12 % MT SOLN
15.0000 mL | Freq: Once | OROMUCOSAL | Status: AC
  Administered 2022-06-16: 15 mL via OROMUCOSAL
  Filled 2022-06-16: qty 15

## 2022-06-16 MED ORDER — FENTANYL CITRATE (PF) 250 MCG/5ML IJ SOLN
INTRAMUSCULAR | Status: AC
  Filled 2022-06-16: qty 5

## 2022-06-16 MED ORDER — HEPARIN 6000 UNIT IRRIGATION SOLUTION
Status: AC
  Filled 2022-06-16: qty 500

## 2022-06-16 MED ORDER — 0.9 % SODIUM CHLORIDE (POUR BTL) OPTIME
TOPICAL | Status: DC | PRN
  Administered 2022-06-16: 1000 mL

## 2022-06-16 MED ORDER — CEFAZOLIN IN SODIUM CHLORIDE 3-0.9 GM/100ML-% IV SOLN
3.0000 g | INTRAVENOUS | Status: AC
  Administered 2022-06-16: 3 g via INTRAVENOUS
  Filled 2022-06-16: qty 100

## 2022-06-16 SURGICAL SUPPLY — 55 items
ADH SKN CLS APL DERMABOND .7 (GAUZE/BANDAGES/DRESSINGS) ×4
BAG COUNTER SPONGE SURGICOUNT (BAG) ×2 IMPLANT
BAG DECANTER FOR FLEXI CONT (MISCELLANEOUS) ×2 IMPLANT
BAG SPNG CNTER NS LX DISP (BAG) ×2
BIOPATCH RED 1 DISK 7.0 (GAUZE/BANDAGES/DRESSINGS) ×2 IMPLANT
CANISTER SUCT 3000ML PPV (MISCELLANEOUS) ×2 IMPLANT
CATH PALINDROME-P 19CM W/VT (CATHETERS) IMPLANT
CATH PALINDROME-P 23CM W/VT (CATHETERS) IMPLANT
CATH PALINDROME-P 28CM W/VT (CATHETERS) IMPLANT
CLIP TI MEDIUM 6 (CLIP) ×2 IMPLANT
CLIP TI WIDE RED SMALL 6 (CLIP) ×2 IMPLANT
COVER PROBE W GEL 5X96 (DRAPES) ×2 IMPLANT
COVER SURGICAL LIGHT HANDLE (MISCELLANEOUS) ×2 IMPLANT
DERMABOND ADVANCED .7 DNX12 (GAUZE/BANDAGES/DRESSINGS) ×2 IMPLANT
DRAPE C-ARM 42X72 X-RAY (DRAPES) ×2 IMPLANT
DRAPE CHEST BREAST 15X10 FENES (DRAPES) ×2 IMPLANT
ELECT REM PT RETURN 9FT ADLT (ELECTROSURGICAL) ×2
ELECTRODE REM PT RTRN 9FT ADLT (ELECTROSURGICAL) ×2 IMPLANT
GAUZE 4X4 16PLY ~~LOC~~+RFID DBL (SPONGE) ×2 IMPLANT
GLOVE INDICATOR 7.0 STRL GRN (GLOVE) IMPLANT
GLOVE SURG SS PI 6.5 STRL IVOR (GLOVE) IMPLANT
GLOVE SURG SS PI 7.5 STRL IVOR (GLOVE) ×6 IMPLANT
GOWN STRL REUS W/ TWL LRG LVL3 (GOWN DISPOSABLE) ×4 IMPLANT
GOWN STRL REUS W/ TWL XL LVL3 (GOWN DISPOSABLE) ×2 IMPLANT
GOWN STRL REUS W/TWL LRG LVL3 (GOWN DISPOSABLE) ×4
GOWN STRL REUS W/TWL XL LVL3 (GOWN DISPOSABLE) ×2
HEMOSTAT SNOW SURGICEL 2X4 (HEMOSTASIS) IMPLANT
KIT BASIN OR (CUSTOM PROCEDURE TRAY) ×2 IMPLANT
KIT PALINDROME-P 55CM (CATHETERS) IMPLANT
KIT TURNOVER KIT B (KITS) ×2 IMPLANT
NDL 18GX1X1/2 (RX/OR ONLY) (NEEDLE) ×2 IMPLANT
NDL HYPO 25GX1X1/2 BEV (NEEDLE) ×2 IMPLANT
NEEDLE 18GX1X1/2 (RX/OR ONLY) (NEEDLE) ×2 IMPLANT
NEEDLE HYPO 25GX1X1/2 BEV (NEEDLE) ×2 IMPLANT
NS IRRIG 1000ML POUR BTL (IV SOLUTION) ×2 IMPLANT
PACK BASIC III (CUSTOM PROCEDURE TRAY) ×2
PACK CV ACCESS (CUSTOM PROCEDURE TRAY) ×2 IMPLANT
PACK SRG BSC III STRL LF ECLPS (CUSTOM PROCEDURE TRAY) ×2 IMPLANT
PAD ARMBOARD 7.5X6 YLW CONV (MISCELLANEOUS) ×4 IMPLANT
SOAP 2 % CHG 4 OZ (WOUND CARE) ×2 IMPLANT
SUT ETHILON 3 0 PS 1 (SUTURE) ×2 IMPLANT
SUT PROLENE 5 0 C 1 24 (SUTURE) IMPLANT
SUT PROLENE 6 0 BV (SUTURE) IMPLANT
SUT SILK 0 TIES 10X30 (SUTURE) ×2 IMPLANT
SUT VIC AB 3-0 SH 27 (SUTURE) ×2
SUT VIC AB 3-0 SH 27X BRD (SUTURE) ×2 IMPLANT
SUT VICRYL 4-0 PS2 18IN ABS (SUTURE) ×2 IMPLANT
SYR 10ML LL (SYRINGE) ×2 IMPLANT
SYR 20ML LL LF (SYRINGE) ×4 IMPLANT
SYR 5ML LL (SYRINGE) ×2 IMPLANT
SYR CONTROL 10ML LL (SYRINGE) ×2 IMPLANT
TOWEL GREEN STERILE (TOWEL DISPOSABLE) ×4 IMPLANT
TOWEL GREEN STERILE FF (TOWEL DISPOSABLE) ×2 IMPLANT
UNDERPAD 30X36 HEAVY ABSORB (UNDERPADS AND DIAPERS) ×2 IMPLANT
WATER STERILE IRR 1000ML POUR (IV SOLUTION) ×2 IMPLANT

## 2022-06-16 NOTE — Interval H&P Note (Signed)
History and Physical Interval Note:  06/16/2022 9:29 AM  Timothy Holloway  has presented today for surgery, with the diagnosis of Steal syndrome.  The various methods of treatment have been discussed with the patient and family. After consideration of risks, benefits and other options for treatment, the patient has consented to  Procedure(s): LEFT ARM FISTULA LIGATION (Left) INSERTION OF TUNNELED DIALYSIS CATHETER (N/A) as a surgical intervention.  The patient's history has been reviewed, patient examined, no change in status, stable for surgery.  I have reviewed the patient's chart and labs.  Questions were answered to the patient's satisfaction.     Durene Cal

## 2022-06-16 NOTE — Transfer of Care (Signed)
Immediate Anesthesia Transfer of Care Note  Patient: Timothy Holloway  Procedure(s) Performed: LEFT ARM FISTULA LIGATION (Left: Arm Upper) INSERTION OF RIGHT iNTERNAL Jugular TUNNELED DIALYSIS CATHETER (Right: Neck)  Patient Location: PACU  Anesthesia Type:General  Level of Consciousness: awake and alert   Airway & Oxygen Therapy: Patient Spontanous Breathing  Post-op Assessment: Report given to RN  Post vital signs: Reviewed  Last Vitals:  Vitals Value Taken Time  BP 192/75 06/16/22 1130  Temp 36.4 C 06/16/22 1130  Pulse 57 06/16/22 1133  Resp 15 06/16/22 1133  SpO2 97 % 06/16/22 1133  Vitals shown include unvalidated device data.  Last Pain:  Vitals:   06/16/22 1130  TempSrc:   PainSc: 0-No pain      Patients Stated Pain Goal: 0 (06/16/22 0810)  Complications: No notable events documented.

## 2022-06-16 NOTE — Discharge Instructions (Signed)
   [facility]  Discharge Instructions  AV Fistula or Graft Surgery for Dialysis Access  Please refer to the following instructions for your post-procedure care. Your surgeon or physician assistant will discuss any changes with you.  Activity  You may drive the day following your surgery, if you are comfortable and no longer taking prescription pain medication. Resume full activity as the soreness in your incision resolves.  Bathing/Showering  You may shower after you go home. Keep your incision dry for 48 hours. Do not soak in a bathtub, hot tub, or swim until the incision heals completely. You may not shower if you have a hemodialysis catheter.  Incision Care  Clean your incision with mild soap and water after 48 hours. Pat the area dry with a clean towel. You do not need a bandage unless otherwise instructed. Do not apply any ointments or creams to your incision. You may have skin glue on your incision. Do not peel it off. It will come off on its own in about one week. Your arm may swell a bit after surgery. To reduce swelling use pillows to elevate your arm so it is above your heart. Your doctor will tell you if you need to lightly wrap your arm with an ACE bandage.  Diet  Resume your normal diet. There are not special food restrictions following this procedure. In order to heal from your surgery, it is CRITICAL to get adequate nutrition. Your body requires vitamins, minerals, and protein. Vegetables are the best source of vitamins and minerals. Vegetables also provide the perfect balance of protein. Processed food has little nutritional value, so try to avoid this.  Medications  Resume taking all of your medications. If your incision is causing pain, you may take over-the counter pain relievers such as acetaminophen (Tylenol). If you were prescribed a stronger pain medication, please be aware these medications can cause nausea and constipation. Prevent  nausea by taking the medication with a snack or meal. Avoid constipation by drinking plenty of fluids and eating foods with high amount of fiber, such as fruits, vegetables, and grains.  Do not take Tylenol if you are taking prescription pain medications.  Follow up Your surgeon may want to see you in the office following your access surgery. If so, this will be arranged at the time of your surgery.  Please call us immediately for any of the following conditions:  Increased pain, redness, drainage (pus) from your incision site Fever of 101 degrees or higher Severe or worsening pain at your incision site Hand pain or numbness.  Reduce your risk of vascular disease:  Stop smoking. If you would like help, call [facility] at 1-800-QUIT-NOW ((863) 807-9297) or [place] at [phone]  Manage your cholesterol Maintain a desired weight Control your diabetes Keep your blood pressure down  Dialysis  It will take several weeks to several months for your new dialysis access to be ready for use. Your surgeon will determine when it is okay to use it. Your nephrologist will continue to direct your dialysis. You can continue to use your Permcath until your new access is ready for use.   06/16/2022 Timothy Holloway [id number] [birth year]  Surgeon(s): Nada Libman, MD  Procedure(s): LEFT ARM FISTULA LIGATION INSERTION OF TUNNELED DIALYSIS CATHETER   If you have any questions, please call the office at [phone].

## 2022-06-16 NOTE — Anesthesia Procedure Notes (Signed)
Procedure Name: LMA Insertion Date/Time: 06/16/2022 10:21 AM  Performed by: Shary Decamp, CRNAPre-anesthesia Checklist: Patient identified, Emergency Drugs available, Suction available and Patient being monitored Patient Re-evaluated:Patient Re-evaluated prior to induction Oxygen Delivery Method: Circle system utilized Preoxygenation: Pre-oxygenation with 100% oxygen Induction Type: IV induction Ventilation: Mask ventilation without difficulty LMA: LMA flexible inserted LMA Size: 5.0 Number of attempts: 1 Placement Confirmation: positive ETCO2 and breath sounds checked- equal and bilateral Tube secured with: Tape Dental Injury: Teeth and Oropharynx as per pre-operative assessment

## 2022-06-16 NOTE — Anesthesia Postprocedure Evaluation (Signed)
Anesthesia Post Note  Patient: Timothy Holloway  Procedure(s) Performed: LEFT ARM FISTULA LIGATION (Left: Arm Upper) INSERTION OF RIGHT iNTERNAL Jugular TUNNELED DIALYSIS CATHETER (Right: Neck)     Patient location during evaluation: PACU Anesthesia Type: General Level of consciousness: awake and alert Pain management: pain level controlled Vital Signs Assessment: post-procedure vital signs reviewed and stable Respiratory status: spontaneous breathing, nonlabored ventilation, respiratory function stable and patient connected to nasal cannula oxygen Cardiovascular status: blood pressure returned to baseline and stable Postop Assessment: no apparent nausea or vomiting Anesthetic complications: no  No notable events documented.  Last Vitals:  Vitals:   06/16/22 1215 06/16/22 1230  BP: (!) 166/70 (!) 170/69  Pulse: (!) 59 (!) 58  Resp: 16 15  Temp:  36.4 C  SpO2: 93% 96%    Last Pain:  Vitals:   06/16/22 1230  TempSrc:   PainSc: 2                  Kennieth Rad

## 2022-06-17 ENCOUNTER — Encounter (HOSPITAL_COMMUNITY): Payer: Self-pay | Admitting: Surgery

## 2022-06-20 ENCOUNTER — Telehealth: Payer: Self-pay | Admitting: Surgery

## 2022-06-20 NOTE — Telephone Encounter (Signed)
-----   Message from Nada Libman, MD sent at 06/20/2022  8:37 AM EDT ----- 06/16/2022:   Surgeon:  Durene Cal Procedure:   #1: Ultrasound-guided placement of right internal jugular vein tunneled catheter under fluoroscopic guidance   #2: Ligation of left brachiocephalic fistula    Follow-up in [facility] clinic in 2 to 4 weeks for wound check and new access planning

## 2022-06-20 NOTE — Op Note (Signed)
    Patient name: Timothy Holloway MRN: [id number] DOB: Sep 20, 1955 Sex: male  06/16/2022 Pre-operative Diagnosis: End-stage renal disease, left arm steal syndrome Post-operative diagnosis:  Same Surgeon:  Durene Cal Procedure:   #1: Ultrasound-guided placement of right internal jugular vein tunneled catheter under fluoroscopic guidance   #2: Ligation of left brachiocephalic fistula Anesthesia:  general Blood Loss:  minimal Specimens:  none  Findings: Significantly improved Doppler signals in the radial and ulnar artery after fistula ligation  Indications: This is a 67 year old gentleman who is previously undergone a left brachiocephalic fistula.  He now has ulceration and pain and numbness in his hand suggestive of steal.  He comes in today for fistula ligation and catheter placement.  Procedure:  The patient was identified in the holding area and taken to [facility] OR ROOM 12  The patient was then placed supine on the table. general anesthesia was administered.  The patient was prepped and draped in the usual sterile fashion.  A time out was called and antibiotics were administered.  Ultrasound was used to evaluate the right internal jugular vein which was widely patent and easily compressible.  A #11 blade was used to make a skin nick.  The right internal jugular vein was cannulated under ultrasound guidance with a micropuncture needle.  A 018 wire was inserted followed by placement of micropuncture sheath.  Next a 035 wire was advanced down into the inferior vena cava under fluoroscopic visualization.  The subcutaneous tract was dilated with sequential dilators and a peel-away sheath was placed.  A skin exit site was identified below the clavicle on the right.  A 23 cm catheter was then selected.  It was tunneled up to the neck incision and then advanced through the sheath which was removed.  Using fluoroscopy, I confirmed that the catheter tip was at the cavoatrial junction and that there were no  kinks within the catheter.  The cuff was situated at the skin exit site.  Both ports flushed and aspirated without difficulty.  The catheter was sutured into position with a 3-0 nylon.  The neck incision was closed with 4-0 Vicryl.  Sterile dressings were applied.  The appropriate volumes of heparin were placed into the catheter.  Attention was then turned towards the left arm.  A transverse incision was made just proximal to the antecubital crease.  I dissected out the cephalic vein fistula which was a 6 mm vein.  It was mobilized proximally and distally and then clamped.  I then divided the fistula with Metzenbaum scissors.  The proximal end was oversewn with running 5-0 Prolene in 2 layers followed by placement of a 2-0 silk tie.  The distal end was ligated with a 2-0 silk tie.  The wound was irrigated.  There was significantly improved Doppler signals at the wrist.  The wound was irrigated.  Hemostasis was achieved.  The incision was closed with 2 layers of Vicryl followed by Dermabond.  There were no immediate complications.   Disposition: To PACU stable.   Juleen China, M.D., [facility] [place] Office: [phone] Pager:  [phone]

## 2022-06-29 NOTE — Telephone Encounter (Signed)
Appt has been scheduled.

## 2022-07-04 ENCOUNTER — Other Ambulatory Visit: Payer: Self-pay

## 2022-07-04 DIAGNOSIS — N186 End stage renal disease: Secondary | ICD-10-CM

## 2022-07-15 ENCOUNTER — Ambulatory Visit (HOSPITAL_COMMUNITY)
Admission: RE | Admit: 2022-07-15 | Discharge: 2022-07-15 | Disposition: A | Discharge status: Home / Self Care | Payer: BC Managed Care – PPO | Source: Ambulatory Visit | Attending: Vascular Surgery | Admitting: Vascular Surgery

## 2022-07-15 ENCOUNTER — Ambulatory Visit (INDEPENDENT_AMBULATORY_CARE_PROVIDER_SITE_OTHER): Payer: BC Managed Care – PPO | Admitting: Physician Assistant

## 2022-07-15 ENCOUNTER — Telehealth: Payer: Self-pay

## 2022-07-15 ENCOUNTER — Ambulatory Visit (INDEPENDENT_AMBULATORY_CARE_PROVIDER_SITE_OTHER)
Admission: RE | Admit: 2022-07-15 | Discharge: 2022-07-15 | Disposition: A | Discharge status: Home / Self Care | Payer: BC Managed Care – PPO | Source: Ambulatory Visit | Attending: Vascular Surgery | Admitting: Vascular Surgery

## 2022-07-15 VITALS — BP 159/63 | HR 65 | Temp 98.5°F | Resp 16 | Ht 72.0 in | Wt 280.0 lb

## 2022-07-15 DIAGNOSIS — N186 End stage renal disease: Secondary | ICD-10-CM | POA: Diagnosis present

## 2022-07-15 DIAGNOSIS — Z992 Dependence on renal dialysis: Secondary | ICD-10-CM

## 2022-07-15 NOTE — H&P (View-Only) (Signed)
POST OPERATIVE OFFICE NOTE    CC:  F/u for surgery  HPI:  This is a 67 y.o. male who is s/p left BC ligation with TDC placement right side on 06/16/22 by Dr. Brabham.  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   Pt returns today for follow up.  Pt states His left hand fingers are healing well.  He has HD via [facility] MWF.     Allergies  Allergen Reactions   Latex Hives   Lodine [Etodolac] Nausea And Vomiting    Current Outpatient Medications  Medication Sig Dispense Refill   acetaminophen (TYLENOL) 325 MG tablet Take 650 mg by mouth every 6 (six) hours as needed for moderate pain.     amLODipine (NORVASC) 10 MG tablet Take 1 tablet (10 mg total) by mouth daily. 30 tablet 0   aspirin 81 MG tablet Take 81 mg by mouth daily.     atorvastatin (LIPITOR) 10 MG tablet Take 1 tablet (10 mg total) by mouth daily. (Patient taking differently: Take 10 mg by mouth at bedtime.) 30 tablet 11   betamethasone dipropionate 0.05 % cream Apply 1 Application topically daily as needed (on leg).     carvedilol (COREG) 3.125 MG tablet Take 1 tablet (3.125 mg total) by mouth 2 (two) times daily with a meal. (Patient taking differently: Take 6.25 mg by mouth 2 (two) times daily with a meal.) 60 tablet 0   Cholecalciferol (VITAMIN D) 2000 UNITS CAPS Take 2,000 Units by mouth daily.     cloNIDine (CATAPRES) 0.1 MG tablet Take 0.1 mg by mouth 2 (two) times daily.     diclofenac Sodium (VOLTAREN) 1 % GEL Apply 2 g topically daily as needed (pain).     doxycycline (VIBRAMYCIN) 100 MG capsule Take 100 mg by mouth 2 (two) times daily.     Dulaglutide (TRULICITY) 3 MG/0.5ML SOPN Inject 3 mg into the skin once a week. 3 mL 5   escitalopram (LEXAPRO) 10 MG tablet Take 10 mg by mouth daily.     FARXIGA 10 MG TABS tablet Take 10 mg by mouth daily.     fenofibrate (TRICOR) 145 MG tablet Take 1/2 tablet daily (Patient taking differently: Take 72.5 mg by mouth daily.) 30 tablet 0   furosemide  (LASIX) 80 MG tablet Take 80 mg by mouth daily.     Glucosamine-Chondroit-Vit C-Mn (GLUCOSAMINE CHONDR 1500 COMPLX) CAPS Take 1 capsule by mouth 2 (two) times daily.     HUMALOG KWIKPEN 200 UNIT/ML KwikPen Inject 0-30 Units into the skin with breakfast, with lunch, and with evening meal.     hydrALAZINE (APRESOLINE) 50 MG tablet Take 1 tablet (50 mg total) by mouth every 8 (eight) hours. 90 tablet 0   hydrochlorothiazide (HYDRODIURIL) 25 MG tablet Take 25 mg by mouth daily.     insulin detemir (LEVEMIR FLEXTOUCH) 100 UNIT/ML FlexPen Inject 50 Units into the skin daily. (Patient not taking: Reported on 06/16/2022) 15 mL 11   isosorbide mononitrate (IMDUR) 30 MG 24 hr tablet Take 30 mg by mouth daily.     LANTUS SOLOSTAR 100 UNIT/ML Solostar Pen Inject 50 Units into the skin at bedtime.     losartan (COZAAR) 100 MG tablet Take 1 tablet by mouth daily.     meclizine (ANTIVERT) 25 MG tablet Take 25 mg by mouth daily as needed for dizziness.     methocarbamol (ROBAXIN) 500 MG tablet Take 1 tablet by mouth daily as needed for muscle   spasms.     Multiple Vitamin (MULTIVITAMIN) tablet Take 1 tablet by mouth daily.     oxyCODONE-acetaminophen (PERCOCET) 5-325 MG tablet Take 1 tablet by mouth every 6 (six) hours as needed for severe pain. 10 tablet 0   sertraline (ZOLOFT) 50 MG tablet Take 1 tablet by mouth daily.     sodium bicarbonate 650 MG tablet Take 1 tablet (650 mg total) by mouth 2 (two) times daily. 60 tablet 0   sodium chloride (OCEAN) 0.65 % SOLN nasal spray Place 1 spray into both nostrils daily as needed for congestion.     zolpidem (AMBIEN) 10 MG tablet Take 1 tablet (10 mg total) by mouth at bedtime as needed for sleep. 30 tablet 2   No current facility-administered medications for this visit.     ROS:  See HPI  Physical Exam:    Incision:  well healed left UE Extremities:  grip 5/5 equal, palpable radial pulse Neuro: sensation grossly intact Right Pre-Dialysis Findings:   +-----------------------+----------+--------------------+---------+--------  +  Location              PSV (cm/s)Intralum. Diam. (cm)Waveform  Comments  +-----------------------+----------+--------------------+---------+--------  +  Brachial Antecub. [id number]       0.50                triphasic           +-----------------------+----------+--------------------+---------+--------  +  Radial Art at Wrist    130       0.24                triphasic           +-----------------------+----------+--------------------+---------+--------  +  Ulnar Art at Wrist     88        0.27                triphasic           +-----------------------+----------+--------------------+---------+--------  +       Left Pre-Dialysis Findings:  +-----------------------+----------+--------------------+---------+--------  +  Location              PSV (cm/s)Intralum. Diam. (cm)Waveform  Comments  +-----------------------+----------+--------------------+---------+--------  +  Brachial Antecub. [id number]       0.39                triphasic           +-----------------------+----------+--------------------+---------+--------  +  Radial Art at Wrist    96        0.23                triphasic           +-----------------------+----------+--------------------+---------+--------  +  Ulnar Art at Wrist     142       0.22                triphasic           +-----------------------+----------+--------------------+---------+--------   +-----------------+-------------+----------+---------+  Right Cephalic   Diameter (cm)Depth (cm)Findings   +-----------------+-------------+----------+---------+  Shoulder            0.57                          +-----------------+-------------+----------+---------+  Prox upper arm       0.52               branching  +-----------------+-------------+----------+---------+  Mid upper arm        0.58                  branching  +-----------------+-------------+----------+---------+  Dist upper arm       0.65                          +-----------------+-------------+----------+---------+  Antecubital fossa    0.61                          +-----------------+-------------+----------+---------+  Prox forearm         0.43                          +-----------------+-------------+----------+---------+  Mid forearm       0.51 / 0.47           branching  +-----------------+-------------+----------+---------+  Dist forearm         0.45                          +-----------------+-------------+----------+---------+  Wrist               0.42                          +-----------------+-------------+----------+---------+   +-----------------+-------------+----------+---------------------------+  Right Basilic    Diameter (cm)Depth (cm)         Findings            +-----------------+-------------+----------+---------------------------+  Mid upper arm     0.72 / 0.54           branching and branching x 2  +-----------------+-------------+----------+---------------------------+  Dist upper arm       0.49                                            +-----------------+-------------+----------+---------------------------+  Antecubital fossa    0.47                        branching           +-----------------+-------------+----------+---------------------------+  Prox forearm         0.55                        branching           +-----------------+-------------+----------+---------------------------+   +-----------------+-------------+----------+---------+  Left Cephalic    Diameter (cm)Depth (cm)Findings   +-----------------+-------------+----------+---------+  Shoulder            0.44                          +-----------------+-------------+----------+---------+  Prox upper arm       0.41                           +-----------------+-------------+----------+---------+  Mid upper arm        0.42                          +-----------------+-------------+----------+---------+  Dist upper arm       0.42               branching  +-----------------+-------------+----------+---------+  Antecubital fossa                            NV      +-----------------+-------------+----------+---------+  Prox forearm         0.46                          +-----------------+-------------+----------+---------+  Mid forearm          0.46                          +-----------------+-------------+----------+---------+  Dist forearm         0.45                          +-----------------+-------------+----------+---------+   +-----------------+-------------+----------+--------+  Left Basilic     Diameter (cm)Depth (cm)Findings  +-----------------+-------------+----------+--------+  Prox upper arm       0.49                         +-----------------+-------------+----------+--------+  Mid upper arm        0.47                         +-----------------+-------------+----------+--------+  Dist upper arm       0.55                         +-----------------+-------------+----------+--------+  Antecubital fossa    0.50                         +-----------------+-------------+----------+--------+  Prox forearm         0.46                         +-----------------+-------------+----------+--------+   Summary:   Measurements above.   Assessment/Plan:  This is a 67 y.o. male who is s/p left BC ligation secondary to tissue loss steal left hand ischemia and with TDC placement right side on 06/16/22 by Dr. Brabham.    Since ligation his finger are healing nicely.   He has good arterial flow as well as cephalic and basilic veins > 0.4.  I will schedule him for right UE AV fistula verse graft.  We would like to avoid left UE intervention with his recent history of  steal.  He agrees with this plan.   HD MWF.      Jacon Whetzel Maureen Indiya Izquierdo PA-C [facility] [phone]  MD in clinic [place]   

## 2022-07-15 NOTE — Telephone Encounter (Signed)
Attempted to reach patient to schedule surgery- no answer. Unable to leave VM.

## 2022-07-15 NOTE — Progress Notes (Signed)
POST OPERATIVE OFFICE NOTE    CC:  F/u for surgery  HPI:  This is a 67 y.o. male who is s/p left BC ligation with TDC placement right side on 06/16/22 by Dr. Myra Gianotti.  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   Pt returns today for follow up.  Pt states His left hand fingers are healing well.  He has HD via [facility] MWF.     Allergies  Allergen Reactions   Latex Hives   Lodine [Etodolac] Nausea And Vomiting    Current Outpatient Medications  Medication Sig Dispense Refill   acetaminophen (TYLENOL) 325 MG tablet Take 650 mg by mouth every 6 (six) hours as needed for moderate pain.     amLODipine (NORVASC) 10 MG tablet Take 1 tablet (10 mg total) by mouth daily. 30 tablet 0   aspirin 81 MG tablet Take 81 mg by mouth daily.     atorvastatin (LIPITOR) 10 MG tablet Take 1 tablet (10 mg total) by mouth daily. (Patient taking differently: Take 10 mg by mouth at bedtime.) 30 tablet 11   betamethasone dipropionate 0.05 % cream Apply 1 Application topically daily as needed (on leg).     carvedilol (COREG) 3.125 MG tablet Take 1 tablet (3.125 mg total) by mouth 2 (two) times daily with a meal. (Patient taking differently: Take 6.25 mg by mouth 2 (two) times daily with a meal.) 60 tablet 0   Cholecalciferol (VITAMIN D) 2000 UNITS CAPS Take 2,000 Units by mouth daily.     cloNIDine (CATAPRES) 0.1 MG tablet Take 0.1 mg by mouth 2 (two) times daily.     diclofenac Sodium (VOLTAREN) 1 % GEL Apply 2 g topically daily as needed (pain).     doxycycline (VIBRAMYCIN) 100 MG capsule Take 100 mg by mouth 2 (two) times daily.     Dulaglutide (TRULICITY) 3 MG/0.5ML SOPN Inject 3 mg into the skin once a week. 3 mL 5   escitalopram (LEXAPRO) 10 MG tablet Take 10 mg by mouth daily.     FARXIGA 10 MG TABS tablet Take 10 mg by mouth daily.     fenofibrate (TRICOR) 145 MG tablet Take 1/2 tablet daily (Patient taking differently: Take 72.5 mg by mouth daily.) 30 tablet 0   furosemide  (LASIX) 80 MG tablet Take 80 mg by mouth daily.     Glucosamine-Chondroit-Vit C-Mn (GLUCOSAMINE CHONDR 1500 COMPLX) CAPS Take 1 capsule by mouth 2 (two) times daily.     HUMALOG KWIKPEN 200 UNIT/ML KwikPen Inject 0-30 Units into the skin with breakfast, with lunch, and with evening meal.     hydrALAZINE (APRESOLINE) 50 MG tablet Take 1 tablet (50 mg total) by mouth every 8 (eight) hours. 90 tablet 0   hydrochlorothiazide (HYDRODIURIL) 25 MG tablet Take 25 mg by mouth daily.     insulin detemir (LEVEMIR FLEXTOUCH) 100 UNIT/ML FlexPen Inject 50 Units into the skin daily. (Patient not taking: Reported on 06/16/2022) 15 mL 11   isosorbide mononitrate (IMDUR) 30 MG 24 hr tablet Take 30 mg by mouth daily.     LANTUS SOLOSTAR 100 UNIT/ML Solostar Pen Inject 50 Units into the skin at bedtime.     losartan (COZAAR) 100 MG tablet Take 1 tablet by mouth daily.     meclizine (ANTIVERT) 25 MG tablet Take 25 mg by mouth daily as needed for dizziness.     methocarbamol (ROBAXIN) 500 MG tablet Take 1 tablet by mouth daily as needed for muscle  spasms.     Multiple Vitamin (MULTIVITAMIN) tablet Take 1 tablet by mouth daily.     oxyCODONE-acetaminophen (PERCOCET) 5-325 MG tablet Take 1 tablet by mouth every 6 (six) hours as needed for severe pain. 10 tablet 0   sertraline (ZOLOFT) 50 MG tablet Take 1 tablet by mouth daily.     sodium bicarbonate 650 MG tablet Take 1 tablet (650 mg total) by mouth 2 (two) times daily. 60 tablet 0   sodium chloride (OCEAN) 0.65 % SOLN nasal spray Place 1 spray into both nostrils daily as needed for congestion.     zolpidem (AMBIEN) 10 MG tablet Take 1 tablet (10 mg total) by mouth at bedtime as needed for sleep. 30 tablet 2   No current facility-administered medications for this visit.     ROS:  See HPI  Physical Exam:    Incision:  well healed left UE Extremities:  grip 5/5 equal, palpable radial pulse Neuro: sensation grossly intact Right Pre-Dialysis Findings:   +-----------------------+----------+--------------------+---------+--------  +  Location              PSV (cm/s)Intralum. Diam. (cm)Waveform  Comments  +-----------------------+----------+--------------------+---------+--------  +  Brachial Antecub. [id number]       0.50                triphasic           +-----------------------+----------+--------------------+---------+--------  +  Radial Art at Wrist    130       0.24                triphasic           +-----------------------+----------+--------------------+---------+--------  +  Ulnar Art at Wrist     88        0.27                triphasic           +-----------------------+----------+--------------------+---------+--------  +       Left Pre-Dialysis Findings:  +-----------------------+----------+--------------------+---------+--------  +  Location              PSV (cm/s)Intralum. Diam. (cm)Waveform  Comments  +-----------------------+----------+--------------------+---------+--------  +  Brachial Antecub. [id number]       0.39                triphasic           +-----------------------+----------+--------------------+---------+--------  +  Radial Art at Wrist    96        0.23                triphasic           +-----------------------+----------+--------------------+---------+--------  +  Ulnar Art at Wrist     142       0.22                triphasic           +-----------------------+----------+--------------------+---------+--------   +-----------------+-------------+----------+---------+  Right Cephalic   Diameter (cm)Depth (cm)Findings   +-----------------+-------------+----------+---------+  Shoulder            0.57                          +-----------------+-------------+----------+---------+  Prox upper arm       0.52               branching  +-----------------+-------------+----------+---------+  Mid upper arm        0.58  branching  +-----------------+-------------+----------+---------+  Dist upper arm       0.65                          +-----------------+-------------+----------+---------+  Antecubital fossa    0.61                          +-----------------+-------------+----------+---------+  Prox forearm         0.43                          +-----------------+-------------+----------+---------+  Mid forearm       0.51 / 0.47           branching  +-----------------+-------------+----------+---------+  Dist forearm         0.45                          +-----------------+-------------+----------+---------+  Wrist               0.42                          +-----------------+-------------+----------+---------+   +-----------------+-------------+----------+---------------------------+  Right Basilic    Diameter (cm)Depth (cm)         Findings            +-----------------+-------------+----------+---------------------------+  Mid upper arm     0.72 / 0.54           branching and branching x 2  +-----------------+-------------+----------+---------------------------+  Dist upper arm       0.49                                            +-----------------+-------------+----------+---------------------------+  Antecubital fossa    0.47                        branching           +-----------------+-------------+----------+---------------------------+  Prox forearm         0.55                        branching           +-----------------+-------------+----------+---------------------------+   +-----------------+-------------+----------+---------+  Left Cephalic    Diameter (cm)Depth (cm)Findings   +-----------------+-------------+----------+---------+  Shoulder            0.44                          +-----------------+-------------+----------+---------+  Prox upper arm       0.41                           +-----------------+-------------+----------+---------+  Mid upper arm        0.42                          +-----------------+-------------+----------+---------+  Dist upper arm       0.42               branching  +-----------------+-------------+----------+---------+  Antecubital fossa  NV      +-----------------+-------------+----------+---------+  Prox forearm         0.46                          +-----------------+-------------+----------+---------+  Mid forearm          0.46                          +-----------------+-------------+----------+---------+  Dist forearm         0.45                          +-----------------+-------------+----------+---------+   +-----------------+-------------+----------+--------+  Left Basilic     Diameter (cm)Depth (cm)Findings  +-----------------+-------------+----------+--------+  Prox upper arm       0.49                         +-----------------+-------------+----------+--------+  Mid upper arm        0.47                         +-----------------+-------------+----------+--------+  Dist upper arm       0.55                         +-----------------+-------------+----------+--------+  Antecubital fossa    0.50                         +-----------------+-------------+----------+--------+  Prox forearm         0.46                         +-----------------+-------------+----------+--------+   Summary:   Measurements above.   Assessment/Plan:  This is a 67 y.o. male who is s/p left BC ligation secondary to tissue loss steal left hand ischemia and with [facility] placement right side on 06/16/22 by Dr. Myra Gianotti.    Since ligation his finger are healing nicely.   He has good arterial flow as well as cephalic and basilic veins > 0.4.  I will schedule him for right UE AV fistula verse graft.  We would like to avoid left UE intervention with his recent history of  steal.  He agrees with this plan.   HD MWF.      Mosetta Pigeon PA-C [facility] [phone]  MD in clinic [place]

## 2022-07-18 ENCOUNTER — Other Ambulatory Visit: Payer: Self-pay

## 2022-07-18 DIAGNOSIS — N186 End stage renal disease: Secondary | ICD-10-CM

## 2022-07-18 NOTE — Telephone Encounter (Signed)
Attempted to reach patient, no answer and no VM available.   Spoke with Joy at [facility], who proceeded to schedule patient for AVF/AVG surgery with Dr. Arbie Cookey at [facility] on 6/25. Instructions provided and will be faxed to facility to provide to patient and have him contact office if unable to make appointment.

## 2022-07-19 NOTE — Telephone Encounter (Signed)
Patient's wife returned call. Reviewed instructions for surgery on 6/25. She voiced understanding.

## 2022-07-19 NOTE — Telephone Encounter (Signed)
Attempted to reach patient- no answer or VM available.

## 2022-07-22 ENCOUNTER — Encounter (HOSPITAL_COMMUNITY)
Admission: RE | Admit: 2022-07-22 | Discharge: 2022-07-22 | Disposition: A | Discharge status: Home / Self Care | Payer: BC Managed Care – PPO | Source: Ambulatory Visit | Attending: Vascular Surgery | Admitting: Vascular Surgery

## 2022-07-22 ENCOUNTER — Other Ambulatory Visit: Payer: Self-pay

## 2022-07-22 NOTE — Progress Notes (Signed)
Per Joy at [facility], pt no longer on Trulicity.

## 2022-07-26 ENCOUNTER — Other Ambulatory Visit: Payer: Self-pay

## 2022-07-26 ENCOUNTER — Encounter (HOSPITAL_COMMUNITY): Payer: Self-pay | Admitting: Vascular Surgery

## 2022-07-26 ENCOUNTER — Ambulatory Visit (HOSPITAL_COMMUNITY): Payer: BC Managed Care – PPO | Admitting: Anesthesiology

## 2022-07-26 ENCOUNTER — Encounter (HOSPITAL_COMMUNITY)
Admission: RE | Disposition: A | Discharge status: Home / Self Care | Payer: Self-pay | Source: Home / Self Care | Attending: Vascular Surgery

## 2022-07-26 ENCOUNTER — Ambulatory Visit (HOSPITAL_COMMUNITY)
Admission: RE | Admit: 2022-07-26 | Discharge: 2022-07-26 | Disposition: A | Discharge status: Home / Self Care | Payer: BC Managed Care – PPO | Attending: Vascular Surgery | Admitting: Vascular Surgery

## 2022-07-26 DIAGNOSIS — I120 Hypertensive chronic kidney disease with stage 5 chronic kidney disease or end stage renal disease: Secondary | ICD-10-CM | POA: Diagnosis not present

## 2022-07-26 DIAGNOSIS — E1122 Type 2 diabetes mellitus with diabetic chronic kidney disease: Secondary | ICD-10-CM | POA: Diagnosis not present

## 2022-07-26 DIAGNOSIS — N186 End stage renal disease: Secondary | ICD-10-CM | POA: Diagnosis present

## 2022-07-26 DIAGNOSIS — Z7984 Long term (current) use of oral hypoglycemic drugs: Secondary | ICD-10-CM | POA: Diagnosis not present

## 2022-07-26 DIAGNOSIS — Z7985 Long-term (current) use of injectable non-insulin antidiabetic drugs: Secondary | ICD-10-CM | POA: Insufficient documentation

## 2022-07-26 DIAGNOSIS — Z794 Long term (current) use of insulin: Secondary | ICD-10-CM | POA: Insufficient documentation

## 2022-07-26 DIAGNOSIS — N185 Chronic kidney disease, stage 5: Secondary | ICD-10-CM | POA: Diagnosis not present

## 2022-07-26 DIAGNOSIS — N184 Chronic kidney disease, stage 4 (severe): Secondary | ICD-10-CM

## 2022-07-26 HISTORY — PX: AV FISTULA PLACEMENT: SHX1204

## 2022-07-26 LAB — HEMOGLOBIN AND HEMATOCRIT, BLOOD
HCT: 31.9 % — ABNORMAL LOW (ref 39.0–52.0)
Hemoglobin: 10 g/dL — ABNORMAL LOW (ref 13.0–17.0)

## 2022-07-26 LAB — GLUCOSE, CAPILLARY
Glucose-Capillary: 61 mg/dL — ABNORMAL LOW (ref 70–99)
Glucose-Capillary: 81 mg/dL (ref 70–99)

## 2022-07-26 LAB — BASIC METABOLIC PANEL
Anion gap: 11 (ref 5–15)
BUN: 38 mg/dL — ABNORMAL HIGH (ref 8–23)
CO2: 27 mmol/L (ref 22–32)
Calcium: 8.6 mg/dL — ABNORMAL LOW (ref 8.9–10.3)
Chloride: 99 mmol/L (ref 98–111)
Creatinine, Ser: 3.85 mg/dL — ABNORMAL HIGH (ref 0.61–1.24)
GFR, Estimated: 16 mL/min — ABNORMAL LOW (ref >60)
Glucose, Bld: 101 mg/dL — ABNORMAL HIGH (ref 70–99)
Potassium: 3.8 mmol/L (ref 3.5–5.1)
Sodium: 137 mmol/L (ref 135–145)

## 2022-07-26 SURGERY — ARTERIOVENOUS (AV) FISTULA CREATION
Anesthesia: General | Site: Arm Lower | Laterality: Right

## 2022-07-26 MED ORDER — MIDAZOLAM HCL 5 MG/5ML IJ SOLN
INTRAMUSCULAR | Status: DC | PRN
  Administered 2022-07-26: 1 mg via INTRAVENOUS

## 2022-07-26 MED ORDER — OXYCODONE HCL 5 MG PO TABS: 5.0000 mg | ORAL_TABLET | Freq: Once | ORAL | Status: DC | PRN

## 2022-07-26 MED ORDER — CHLORHEXIDINE GLUCONATE 4 % EX SOLN: 60.0000 mL | Freq: Once | CUTANEOUS | Status: DC

## 2022-07-26 MED ORDER — OXYCODONE HCL 5 MG/5ML PO SOLN: 5.0000 mg | Freq: Once | ORAL | Status: DC | PRN

## 2022-07-26 MED ORDER — PROPOFOL 500 MG/50ML IV EMUL
INTRAVENOUS | Status: DC | PRN
  Administered 2022-07-26: 70 ug/kg/min via INTRAVENOUS

## 2022-07-26 MED ORDER — FENTANYL CITRATE (PF) 100 MCG/2ML IJ SOLN
INTRAMUSCULAR | Status: AC
  Filled 2022-07-26: qty 2

## 2022-07-26 MED ORDER — HEPARIN SODIUM (PORCINE) 1000 UNIT/ML IJ SOLN
INTRAMUSCULAR | Status: AC
  Filled 2022-07-26: qty 6

## 2022-07-26 MED ORDER — PROPOFOL 10 MG/ML IV BOLUS
INTRAVENOUS | Status: DC | PRN
  Administered 2022-07-26: 20 mg via INTRAVENOUS

## 2022-07-26 MED ORDER — ORAL CARE MOUTH RINSE: 15.0000 mL | Freq: Once | OROMUCOSAL | Status: DC

## 2022-07-26 MED ORDER — FENTANYL CITRATE (PF) 100 MCG/2ML IJ SOLN
INTRAMUSCULAR | Status: DC | PRN
  Administered 2022-07-26: 50 ug via INTRAVENOUS

## 2022-07-26 MED ORDER — CHLORHEXIDINE GLUCONATE 0.12 % MT SOLN: 15.0000 mL | Freq: Once | OROMUCOSAL | Status: DC

## 2022-07-26 MED ORDER — CEFAZOLIN SODIUM-DEXTROSE 2-4 GM/100ML-% IV SOLN
2.0000 g | INTRAVENOUS | Status: DC
  Filled 2022-07-26: qty 100

## 2022-07-26 MED ORDER — EPHEDRINE SULFATE (PRESSORS) 50 MG/ML IJ SOLN
INTRAMUSCULAR | Status: DC | PRN
  Administered 2022-07-26 (×2): 5 mg via INTRAVENOUS

## 2022-07-26 MED ORDER — CEFAZOLIN IN SODIUM CHLORIDE 3-0.9 GM/100ML-% IV SOLN
INTRAVENOUS | Status: AC
  Filled 2022-07-26: qty 100

## 2022-07-26 MED ORDER — CEFAZOLIN IN SODIUM CHLORIDE 3-0.9 GM/100ML-% IV SOLN
3.0000 g | Freq: Once | INTRAVENOUS | Status: AC
  Administered 2022-07-26: 3 g via INTRAVENOUS

## 2022-07-26 MED ORDER — ONDANSETRON HCL 4 MG/2ML IJ SOLN
INTRAMUSCULAR | Status: AC
  Filled 2022-07-26: qty 2

## 2022-07-26 MED ORDER — LACTATED RINGERS IV SOLN: INTRAVENOUS | Status: DC

## 2022-07-26 MED ORDER — 0.9 % SODIUM CHLORIDE (POUR BTL) OPTIME
TOPICAL | Status: DC | PRN
  Administered 2022-07-26: 1000 mL

## 2022-07-26 MED ORDER — HEPARIN 6000 UNIT IRRIGATION SOLUTION
Status: DC | PRN
  Administered 2022-07-26: 1

## 2022-07-26 MED ORDER — ONDANSETRON HCL 4 MG/2ML IJ SOLN: 4.0000 mg | Freq: Once | INTRAMUSCULAR | Status: DC | PRN

## 2022-07-26 MED ORDER — LIDOCAINE HCL 1 % IJ SOLN
INTRAMUSCULAR | Status: DC | PRN
  Administered 2022-07-26: 50 mg via INTRADERMAL

## 2022-07-26 MED ORDER — SODIUM CHLORIDE 0.9 % IV SOLN: INTRAVENOUS | Status: DC

## 2022-07-26 MED ORDER — OXYCODONE-ACETAMINOPHEN 5-325 MG PO TABS: 1.0000 | ORAL_TABLET | Freq: Four times a day (QID) | ORAL | 0 refills | Status: DC | PRN

## 2022-07-26 MED ORDER — LIDOCAINE-EPINEPHRINE 0.5 %-1:200000 IJ SOLN
INTRAMUSCULAR | Status: AC
  Filled 2022-07-26: qty 50

## 2022-07-26 MED ORDER — MIDAZOLAM HCL 2 MG/2ML IJ SOLN
INTRAMUSCULAR | Status: AC
  Filled 2022-07-26: qty 2

## 2022-07-26 MED ORDER — LIDOCAINE HCL (PF) 2 % IJ SOLN
INTRAMUSCULAR | Status: AC
  Filled 2022-07-26: qty 5

## 2022-07-26 MED ORDER — FENTANYL CITRATE PF 50 MCG/ML IJ SOSY: 25.0000 ug | PREFILLED_SYRINGE | INTRAMUSCULAR | Status: DC | PRN

## 2022-07-26 MED ORDER — LIDOCAINE-EPINEPHRINE 0.5 %-1:200000 IJ SOLN
INTRAMUSCULAR | Status: DC | PRN
  Administered 2022-07-26: 7 mL

## 2022-07-26 MED ORDER — PROPOFOL 10 MG/ML IV BOLUS
INTRAVENOUS | Status: AC
  Filled 2022-07-26: qty 20

## 2022-07-26 MED ORDER — EPHEDRINE 5 MG/ML INJ
INTRAVENOUS | Status: AC
  Filled 2022-07-26: qty 5

## 2022-07-26 SURGICAL SUPPLY — 40 items
ADH SKN CLS APL DERMABOND .7 (GAUZE/BANDAGES/DRESSINGS) ×1
ARMBAND PINK RESTRICT EXTREMIT (MISCELLANEOUS) ×1 IMPLANT
BAG HAMPER (MISCELLANEOUS) ×1 IMPLANT
CANNULA VESSEL 3MM 2 BLNT TIP (CANNULA) ×1 IMPLANT
CLIP LIGATING EXTRA MED SLVR (CLIP) ×1 IMPLANT
CLIP LIGATING EXTRA SM BLUE (MISCELLANEOUS) ×1 IMPLANT
COVER LIGHT HANDLE STERIS (MISCELLANEOUS) ×2 IMPLANT
COVER MAYO STAND XLG (MISCELLANEOUS) ×1 IMPLANT
COVER PROBE U/S 5X48 (MISCELLANEOUS) IMPLANT
DECANTER SPIKE VIAL GLASS SM (MISCELLANEOUS) ×1 IMPLANT
DERMABOND ADVANCED .7 DNX12 (GAUZE/BANDAGES/DRESSINGS) ×1 IMPLANT
ELECT REM PT RETURN 9FT ADLT (ELECTROSURGICAL) ×1
ELECTRODE REM PT RTRN 9FT ADLT (ELECTROSURGICAL) ×1 IMPLANT
GAUZE SPONGE 4X4 12PLY STRL (GAUZE/BANDAGES/DRESSINGS) ×1 IMPLANT
GLOVE BIOGEL PI IND STRL 7.0 (GLOVE) ×2 IMPLANT
GLOVE SURG MICRO LTX SZ7.5 (GLOVE) ×1 IMPLANT
GOWN STRL REUS W/TWL LRG LVL3 (GOWN DISPOSABLE) ×3 IMPLANT
IV NS 500ML (IV SOLUTION) ×1
IV NS 500ML BAXH (IV SOLUTION) ×1 IMPLANT
KIT BLADEGUARD II DBL (SET/KITS/TRAYS/PACK) ×1 IMPLANT
KIT TURNOVER KIT A (KITS) ×1 IMPLANT
LOOP VESSEL MINI RED (MISCELLANEOUS) IMPLANT
MANIFOLD NEPTUNE II (INSTRUMENTS) ×1 IMPLANT
MARKER SKIN DUAL TIP RULER LAB (MISCELLANEOUS) ×2 IMPLANT
NDL HYPO 18GX1.5 BLUNT FILL (NEEDLE) ×1 IMPLANT
NEEDLE HYPO 18GX1.5 BLUNT FILL (NEEDLE) ×1 IMPLANT
NS IRRIG 1000ML POUR BTL (IV SOLUTION) ×1 IMPLANT
PACK CV ACCESS (CUSTOM PROCEDURE TRAY) ×1 IMPLANT
PAD ARMBOARD 7.5X6 YLW CONV (MISCELLANEOUS) ×1 IMPLANT
SET BASIN LINEN APH (SET/KITS/TRAYS/PACK) ×1 IMPLANT
SOL PREP POV-IOD 4OZ 10% (MISCELLANEOUS) ×1 IMPLANT
SOL PREP PROV IODINE SCRUB 4OZ (MISCELLANEOUS) ×1 IMPLANT
SUT PROLENE 6 0 CC (SUTURE) ×1 IMPLANT
SUT SILK 2 0 SH (SUTURE) IMPLANT
SUT VIC AB 3-0 SH 27 (SUTURE) ×1
SUT VIC AB 3-0 SH 27X BRD (SUTURE) ×1 IMPLANT
SYR 10ML LL (SYRINGE) ×1 IMPLANT
SYR 50ML LL SCALE MARK (SYRINGE) ×1 IMPLANT
UNDERPAD 30X36 HEAVY ABSORB (UNDERPADS AND DIAPERS) ×1 IMPLANT
VESSEL LOOPS MINI RED (MISCELLANEOUS) ×1

## 2022-07-26 NOTE — Op Note (Signed)
    OPERATIVE REPORT  DATE OF SURGERY: 07/26/2022  PATIENT: Timothy Holloway, 67 y.o. male MRN: [id number]  DOB: 06-16-1955  PRE-OPERATIVE DIAGNOSIS: End-stage renal disease  POST-OPERATIVE DIAGNOSIS:  Same  PROCEDURE: Right radiocephalic AV fistula creation  SURGEON:  Gretta Began, M.D.  PHYSICIAN ASSISTANT: Rebeca Alert, RNFA  The assistant was needed for exposure and to expedite the case  ANESTHESIA: MAC  EBL: per anesthesia record  Total I/O In: 500 [I.V.:400; IV Piggyback:100] Out: 5 [Blood:5]  BLOOD ADMINISTERED: none  DRAINS: none  SPECIMEN: none  COUNTS CORRECT:  YES  PATIENT DISPOSITION:  PACU - hemodynamically stable  PROCEDURE DETAILS: .  He had had a left brachiocephalic fistula which required ligation due to severe steal and tissue loss on the fingers of his left hand.  He is scheduled today for right arm access.  I discussed this with the patient and his wife in the preoperative holding area that he certainly is at risk for steal in his right hand.  He does have moderate cephalic vein on physical exam and I explained that he would have a much lower risk for steal if he had a distal fistula versus a brachiocephalic fistula.  I explained that we would do all technical maneuvers to use risk of steal.  I discussed the danger of ongoing catheter use.  The patient was taken operating placed supine position where the area of the right prepped draped in usual sterile fashion.  SonoSite ultrasound was used to visualize the cephalic vein which was patent throughout its course from the wrist to the shoulder.  The patient had a moderate size cephalic vein in the forearm with no improved caliber in the upper arm.  He did have a palpable radial pulse.  Using local anesthesia, incision was made between the level of the cephalic vein and the radial artery at the wrist.  The cephalic vein was mobilized and was of good size.  The vein was mobilized proximally distally and was  ligated distally and was gently dilated and felt to be adequate for fistula.  The radial artery was exposed through the same incision.  The patient had extensive atherosclerotic changes and calcification of his radial artery.  He did have a pulse with severe circumferential calcification.  The artery was encircled with a red vessel loop with proximally and distally.  The Vesseloops were tightened and the artery was opened.  This did not control inflow therefore fistula clamp was placed on the artery.  The vein was cut to the appropriate length and was spatulated and sewn end-to-side to the artery with a running 6-0 Prolene suture.  Prior to completion of the anastomosis 1-1/2 dilator passed proximally and distally through the artery.  The anastomosis was completed and flow was restored.  The patient had excellent flow in the cephalic vein with very good size.  The wound was irrigated with saline.  Hemostasis was obtained with electrocautery.  The wound was closed with 3-0 Vicryl in the subcutaneous and subcuticular tissue.  Dermabond was placed over the wound and the patient was transferred to the recovery room in stable condition   Larina Earthly, M.D., [facility] 07/26/2022 1:00 PM  Note: Portions of this report may have been transcribed using voice recognition software.  Every effort has been made to ensure accuracy; however, inadvertent computerized transcription errors may still be present.

## 2022-07-26 NOTE — Interval H&P Note (Signed)
History and Physical Interval Note:  07/26/2022 10:48 AM  Timothy Holloway  has presented today for surgery, with the diagnosis of ESRD.  The various methods of treatment have been discussed with the patient and family. After consideration of risks, benefits and other options for treatment, the patient has consented to  Procedure(s): RIGHT UPPER EXTREMITY ARTERIOVENOUS (AV) FISTULA VERSUS ARTERIOVENOUS GRAFT CREATION (Right) as a surgical intervention.  The patient's history has been reviewed, patient examined, no change in status, stable for surgery.  I have reviewed the patient's chart and labs.  Questions were answered to the patient's satisfaction.     Gretta Began

## 2022-07-26 NOTE — Transfer of Care (Signed)
Immediate Anesthesia Transfer of Care Note  Patient: Timothy Holloway  Procedure(s) Performed: RIGHT UPPER EXTREMITY ARTERIOVENOUS (AV) FISTULA CREATION (Right: Arm Lower)  Patient Location: PACU  Anesthesia Type:General  Level of Consciousness: awake  Airway & Oxygen Therapy: Patient Spontanous Breathing  Post-op Assessment: Report given to RN  Post vital signs: Reviewed and stable  Last Vitals:  Vitals Value Taken Time  BP    Temp    Pulse    Resp    SpO2      Last Pain: There were no vitals filed for this visit.       Complications: No notable events documented.

## 2022-07-26 NOTE — Discharge Instructions (Signed)
[facility]  Discharge Instructions  AV Fistula or Graft Surgery for Dialysis Access  Please refer to the following instructions for your post-procedure care. Your surgeon or physician assistant will discuss any changes with you.  Activity  You may drive the day following your surgery, if you are comfortable and no longer taking prescription pain medication. Resume full activity as the soreness in your incision resolves.  Bathing/Showering  You may shower after you go home. Keep your incision dry for 48 hours. Do not soak in a bathtub, hot tub, or swim until the incision heals completely. You may not shower if you have a hemodialysis catheter.  Incision Care  Clean your incision with mild soap and water after 48 hours. Pat the area dry with a clean towel. You do not need a bandage unless otherwise instructed. Do not apply any ointments or creams to your incision. You may have skin glue on your incision. Do not peel it off. It will come off on its own in about one week. Your arm may swell a bit after surgery. To reduce swelling use pillows to elevate your arm so it is above your heart. Your doctor will tell you if you need to lightly wrap your arm with an ACE bandage.  Diet  Resume your normal diet. There are not special food restrictions following this procedure. In order to heal from your surgery, it is CRITICAL to get adequate nutrition. Your body requires vitamins, minerals, and protein. Vegetables are the best source of vitamins and minerals. Vegetables also provide the perfect balance of protein. Processed food has little nutritional value, so try to avoid this.  Medications  Resume taking all of your medications. If your incision is causing pain, you may take over-the counter pain relievers such as acetaminophen (Tylenol). If you were prescribed a stronger pain medication, please be aware these medications can cause nausea and constipation. Prevent  nausea by taking the medication with a snack or meal. Avoid constipation by drinking plenty of fluids and eating foods with high amount of fiber, such as fruits, vegetables, and grains.  Do not take Tylenol if you are taking prescription pain medications.  Follow up Your surgeon may want to see you in the office following your access surgery. If so, this will be arranged at the time of your surgery.  Please call us immediately for any of the following conditions:  Increased pain, redness, drainage (pus) from your incision site Fever of 101 degrees or higher Severe or worsening pain at your incision site Hand pain or numbness.  Reduce your risk of vascular disease:  Stop smoking. If you would like help, call [facility] at 1-800-QUIT-NOW (479-838-6768) or [place] at (856)712-5075  Manage your cholesterol Maintain a desired weight Control your diabetes Keep your blood pressure down  Dialysis  It will take several weeks to several months for your new dialysis access to be ready for use. Your surgeon will determine when it is okay to use it. Your nephrologist will continue to direct your dialysis. You can continue to use your Permcath until your new access is ready for use.   07/26/2022 LAUREN AGUAYO [id number] 12-06-1955  Surgeon(s): Adriena Manfre, Kristen Loader, MD  Procedure(s): RIGHT UPPER EXTREMITY ARTERIOVENOUS (AV) FISTULA CREATION   May stick graft immediately   May stick graft on designated area only:    Do not stick fistula for 12 weeks    If you have any questions, please call the office at  [phone].  

## 2022-07-26 NOTE — Anesthesia Preprocedure Evaluation (Signed)
Anesthesia Evaluation  Patient identified by MRN, date of birth, ID band Patient awake    Reviewed: Allergy & Precautions, H&P , NPO status , Patient's Chart, lab work & pertinent test results, reviewed documented beta blocker date and time   Airway Mallampati: II  TM Distance: >3 FB Neck ROM: full    Dental no notable dental hx.    Pulmonary neg pulmonary ROS   Pulmonary exam normal breath sounds clear to auscultation       Cardiovascular Exercise Tolerance: Good hypertension, negative cardio ROS  Rhythm:regular Rate:Normal     Neuro/Psych  PSYCHIATRIC DISORDERS  Depression    negative neurological ROS  negative psych ROS   GI/Hepatic negative GI ROS, Neg liver ROS,,,  Endo/Other  negative endocrine ROSdiabetes    Renal/GU ESRF, CRF and DialysisRenal diseasenegative Renal ROS  negative genitourinary   Musculoskeletal   Abdominal   Peds  Hematology negative hematology ROS (+) Blood dyscrasia, anemia   Anesthesia Other Findings   Reproductive/Obstetrics negative OB ROS                             Anesthesia Physical Anesthesia Plan  ASA: 3  Anesthesia Plan: General   Post-op Pain Management:    Induction:   PONV Risk Score and Plan:   Airway Management Planned:   Additional Equipment:   Intra-op Plan:   Post-operative Plan:   Informed Consent: I have reviewed the patients History and Physical, chart, labs and discussed the procedure including the risks, benefits and alternatives for the proposed anesthesia with the patient or authorized representative who has indicated his/her understanding and acceptance.     Dental Advisory Given  Plan Discussed with: CRNA  Anesthesia Plan Comments:        Anesthesia Quick Evaluation

## 2022-07-26 NOTE — Progress Notes (Signed)
Hypoglycemic Event  CBG: 60 mg/dl  Treatment: 4 oz juice/soda  Symptoms: None  Follow-up CBG: Time:Dr  CBG Result:  Possible Reasons for Event: Inadequate meal intake  Comments/MD notified:Dr Kiel notified     Cletus Gash, Meredith Mody

## 2022-07-28 NOTE — Anesthesia Postprocedure Evaluation (Signed)
Anesthesia Post Note  Patient: Timothy Holloway  Procedure(s) Performed: RIGHT UPPER EXTREMITY ARTERIOVENOUS (AV) FISTULA CREATION (Right: Arm Lower)  Patient location during evaluation: Phase II Anesthesia Type: General Level of consciousness: awake Pain management: pain level controlled Vital Signs Assessment: post-procedure vital signs reviewed and stable Respiratory status: spontaneous breathing and respiratory function stable Cardiovascular status: blood pressure returned to baseline and stable Postop Assessment: no headache and no apparent nausea or vomiting Anesthetic complications: no Comments: Late entry   No notable events documented.   Last Vitals:  Vitals:   07/26/22 1258 07/26/22 1318  BP: (!) 121/48 (!) 116/54  Pulse: (!) 52 (!) 49  Resp:    Temp: 36.7 C (!) 36.3 C  SpO2: 98% 100%    Last Pain:  Vitals:   07/27/22 1407  TempSrc:   PainSc: 0-No pain                 Windell Norfolk

## 2022-08-01 ENCOUNTER — Encounter (HOSPITAL_COMMUNITY): Payer: Self-pay | Admitting: Vascular Surgery

## 2022-08-05 ENCOUNTER — Other Ambulatory Visit: Payer: Self-pay | Admitting: *Deleted

## 2022-08-05 DIAGNOSIS — N186 End stage renal disease: Secondary | ICD-10-CM

## 2022-08-12 ENCOUNTER — Encounter (HOSPITAL_COMMUNITY): Payer: Self-pay

## 2022-08-19 ENCOUNTER — Ambulatory Visit (INDEPENDENT_AMBULATORY_CARE_PROVIDER_SITE_OTHER): Payer: BC Managed Care – PPO | Admitting: Physician Assistant

## 2022-08-19 ENCOUNTER — Encounter: Payer: Self-pay | Admitting: Physician Assistant

## 2022-08-19 ENCOUNTER — Ambulatory Visit (HOSPITAL_COMMUNITY)
Admission: RE | Admit: 2022-08-19 | Discharge: 2022-08-19 | Disposition: A | Discharge status: Home / Self Care | Payer: BC Managed Care – PPO | Source: Ambulatory Visit | Attending: Vascular Surgery | Admitting: Vascular Surgery

## 2022-08-19 DIAGNOSIS — Z992 Dependence on renal dialysis: Secondary | ICD-10-CM | POA: Insufficient documentation

## 2022-08-19 DIAGNOSIS — N186 End stage renal disease: Secondary | ICD-10-CM

## 2022-08-19 NOTE — Progress Notes (Signed)
    Postoperative Access Visit   History of Present Illness   Timothy Holloway is a 67 y.o. year old male who presents for postoperative follow-up for: right radiocephalic arteriovenous fistula by Dr. Arbie Cookey (Date: 07/26/22).  The patient's wounds are healed.  The patient denies steal symptoms.  The patient is able to complete their activities of daily living. He is currently dialyzing via R internal jugular TDC.  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  Wounds of L finger tips are healing well.   Physical Examination  There were no vitals filed for this visit. There is no height or weight on file to calculate BMI.  right arm Incision is healed, grip is 5/5, sensation in digits is intact, palpable thrill, bruit can be auscultated     Medical Decision Making   Timothy Holloway is a 67 y.o. year old male who presents s/p right radiocephalic arteriovenous fistula  Patent radiocephalic fistula without signs or symptoms of steal syndrome Fistula still needs to mature; we will repeat duplex in 4-6 weeks; we discussed possible fistulogram and/or branch ligation if fistula fails to adequately mature Continue HD via R internal jugular TDC for now   Emilie Rutter PA-C [facility] [place] Office: [phone]  Clinic MD: Karin Lieu

## 2022-08-24 ENCOUNTER — Other Ambulatory Visit: Payer: Self-pay

## 2022-08-24 DIAGNOSIS — N186 End stage renal disease: Secondary | ICD-10-CM

## 2022-09-23 ENCOUNTER — Encounter (HOSPITAL_COMMUNITY): Payer: Self-pay

## 2022-09-30 ENCOUNTER — Ambulatory Visit (HOSPITAL_COMMUNITY)
Admission: RE | Admit: 2022-09-30 | Discharge: 2022-09-30 | Disposition: A | Discharge status: Home / Self Care | Payer: BC Managed Care – PPO | Source: Ambulatory Visit | Attending: Vascular Surgery | Admitting: Vascular Surgery

## 2022-09-30 ENCOUNTER — Ambulatory Visit (INDEPENDENT_AMBULATORY_CARE_PROVIDER_SITE_OTHER): Payer: BC Managed Care – PPO | Admitting: Physician Assistant

## 2022-09-30 VITALS — BP 139/81 | HR 62 | Temp 98.1°F | Resp 18 | Ht 72.0 in | Wt 287.7 lb

## 2022-09-30 DIAGNOSIS — Z992 Dependence on renal dialysis: Secondary | ICD-10-CM | POA: Diagnosis present

## 2022-09-30 DIAGNOSIS — N186 End stage renal disease: Secondary | ICD-10-CM

## 2022-09-30 NOTE — Progress Notes (Signed)
POST OPERATIVE OFFICE NOTE    CC:  F/u for surgery  HPI:  This is a 67 y.o. male who is ESRDs/p right RC AV fistula creation that has been slow to mature.  He is on HD MWF via TDC.     Pt returns today for follow up.  Pt states he has no symptoms of steal.  He is using the right UE to try and assist with maturity of the fistula.     Allergies  Allergen Reactions   Latex Hives   Lodine [Etodolac] Nausea And Vomiting    Current Outpatient Medications  Medication Sig Dispense Refill   acetaminophen (TYLENOL) 325 MG tablet Take 650 mg by mouth every 6 (six) hours as needed for moderate pain.     amLODipine (NORVASC) 10 MG tablet Take 1 tablet (10 mg total) by mouth daily. 30 tablet 0   aspirin 81 MG tablet Take 81 mg by mouth daily.     atorvastatin (LIPITOR) 10 MG tablet Take 1 tablet (10 mg total) by mouth daily. (Patient taking differently: Take 10 mg by mouth at bedtime.) 30 tablet 11   betamethasone dipropionate 0.05 % cream Apply 1 Application topically daily as needed (on leg).     carvedilol (COREG) 3.125 MG tablet Take 1 tablet (3.125 mg total) by mouth 2 (two) times daily with a meal. (Patient taking differently: Take 6.25 mg by mouth 2 (two) times daily with a meal.) 60 tablet 0   Cholecalciferol (VITAMIN D) 2000 UNITS CAPS Take 2,000 Units by mouth daily.     cloNIDine (CATAPRES) 0.1 MG tablet Take 0.1 mg by mouth 2 (two) times daily. (Patient not taking: Reported on 09/30/2022)     diclofenac Sodium (VOLTAREN) 1 % GEL Apply 2 g topically daily as needed (pain).     doxycycline (VIBRAMYCIN) 100 MG capsule Take 100 mg by mouth 2 (two) times daily.     Dulaglutide (TRULICITY) 3 MG/0.5ML SOPN Inject 3 mg into the skin once a week. 3 mL 5   escitalopram (LEXAPRO) 10 MG tablet Take 10 mg by mouth daily.     FARXIGA 10 MG TABS tablet Take 10 mg by mouth daily.     fenofibrate (TRICOR) 145 MG tablet Take 1/2 tablet daily (Patient taking differently: Take 72.5 mg by mouth daily.) 30  tablet 0   furosemide (LASIX) 80 MG tablet Take 80 mg by mouth daily.     Glucosamine-Chondroit-Vit C-Mn (GLUCOSAMINE CHONDR 1500 COMPLX) CAPS Take 1 capsule by mouth 2 (two) times daily.     HUMALOG KWIKPEN 200 UNIT/ML KwikPen Inject 0-30 Units into the skin with breakfast, with lunch, and with evening meal.     hydrALAZINE (APRESOLINE) 50 MG tablet Take 1 tablet (50 mg total) by mouth every 8 (eight) hours. 90 tablet 0   hydrochlorothiazide (HYDRODIURIL) 25 MG tablet Take 25 mg by mouth daily.     insulin detemir (LEVEMIR FLEXTOUCH) 100 UNIT/ML FlexPen Inject 50 Units into the skin daily. 15 mL 11   isosorbide mononitrate (IMDUR) 30 MG 24 hr tablet Take 30 mg by mouth daily.     LANTUS SOLOSTAR 100 UNIT/ML Solostar Pen Inject 50 Units into the skin at bedtime.     losartan (COZAAR) 100 MG tablet Take 1 tablet by mouth daily.     meclizine (ANTIVERT) 25 MG tablet Take 25 mg by mouth daily as needed for dizziness.     methocarbamol (ROBAXIN) 500 MG tablet Take 1 tablet by mouth daily as needed for  muscle spasms.     Multiple Vitamin (MULTIVITAMIN) tablet Take 1 tablet by mouth daily.     oxyCODONE-acetaminophen (PERCOCET) 5-325 MG tablet Take 1 tablet by mouth every 6 (six) hours as needed for severe pain. (Patient not taking: Reported on 09/30/2022) 10 tablet 0   oxyCODONE-acetaminophen (PERCOCET) 5-325 MG tablet Take 1 tablet by mouth every 6 (six) hours as needed for severe pain. (Patient not taking: Reported on 09/30/2022) 8 tablet 0   oxyCODONE-acetaminophen (PERCOCET) 5-325 MG tablet Take 1 tablet by mouth every 6 (six) hours as needed for severe pain. (Patient not taking: Reported on 09/30/2022) 8 tablet 0   sertraline (ZOLOFT) 50 MG tablet Take 1 tablet by mouth daily.     sodium bicarbonate 650 MG tablet Take 1 tablet (650 mg total) by mouth 2 (two) times daily. 60 tablet 0   sodium chloride (OCEAN) 0.65 % SOLN nasal spray Place 1 spray into both nostrils daily as needed for congestion.      zolpidem (AMBIEN) 10 MG tablet Take 1 tablet (10 mg total) by mouth at bedtime as needed for sleep. 30 tablet 2   No current facility-administered medications for this visit.     ROS:  See HPI  Physical Exam:  Findings:  +--------------------+----------+-----------------+--------+  AVF                PSV (cm/s)Flow Vol (mL/min)Comments  +--------------------+----------+-----------------+--------+  Native artery inflow   349           329                 +--------------------+----------+-----------------+--------+  AVF Anastomosis        482                               +--------------------+----------+-----------------+--------+     +------------+----------+-------------+----------+--------+  OUTFLOW VEINPSV (cm/s)Diameter (cm)Depth (cm)Describe  +------------+----------+-------------+----------+--------+  Prox Forearm    48        0.43        0.86             +------------+----------+-------------+----------+--------+  Mid Forearm     56        0.56        0.42             +------------+----------+-------------+----------+--------+  Dist Forearm    76        0.51        0.23             +------------+----------+-------------+----------+--------+  Wrist         113        0.55        0.34             +------------+----------+-------------+----------+--------+     Summary:  Patent right RC AVF.  Volume flow 329 ml/min.  No thrombus seen.   Incision:  Well healed Extremities:  palpable thrill distal fistula, then weaker proximally     Assessment/Plan:  This is a 67 y.o. male who is s/p:Right RC AV fistula with low flow rates and diameter < 0.6 cm   -He is on HD via [facility] MWF.  I will schedule him for fistulogram with possible intervention due to slow maturity in size and decreased flow rates.  If this is not resolved he will need new access.  He agrees with this plan.  He is not medically managed on anticoagulation medications.     Kara Mead  Lianne Cure PA-C [facility] [phone]   Clinic MD:  Karin Lieu

## 2022-09-30 NOTE — H&P (View-Only) (Signed)
POST OPERATIVE OFFICE NOTE    CC:  F/u for surgery  HPI:  This is a 67 y.o. male who is ESRDs/p right RC AV fistula creation that has been slow to mature.  He is on HD MWF via TDC.     Pt returns today for follow up.  Pt states he has no symptoms of steal.  He is using the right UE to try and assist with maturity of the fistula.     Allergies  Allergen Reactions   Latex Hives   Lodine [Etodolac] Nausea And Vomiting    Current Outpatient Medications  Medication Sig Dispense Refill   acetaminophen (TYLENOL) 325 MG tablet Take 650 mg by mouth every 6 (six) hours as needed for moderate pain.     amLODipine (NORVASC) 10 MG tablet Take 1 tablet (10 mg total) by mouth daily. 30 tablet 0   aspirin 81 MG tablet Take 81 mg by mouth daily.     atorvastatin (LIPITOR) 10 MG tablet Take 1 tablet (10 mg total) by mouth daily. (Patient taking differently: Take 10 mg by mouth at bedtime.) 30 tablet 11   betamethasone dipropionate 0.05 % cream Apply 1 Application topically daily as needed (on leg).     carvedilol (COREG) 3.125 MG tablet Take 1 tablet (3.125 mg total) by mouth 2 (two) times daily with a meal. (Patient taking differently: Take 6.25 mg by mouth 2 (two) times daily with a meal.) 60 tablet 0   Cholecalciferol (VITAMIN D) 2000 UNITS CAPS Take 2,000 Units by mouth daily.     cloNIDine (CATAPRES) 0.1 MG tablet Take 0.1 mg by mouth 2 (two) times daily. (Patient not taking: Reported on 09/30/2022)     diclofenac Sodium (VOLTAREN) 1 % GEL Apply 2 g topically daily as needed (pain).     doxycycline (VIBRAMYCIN) 100 MG capsule Take 100 mg by mouth 2 (two) times daily.     Dulaglutide (TRULICITY) 3 MG/0.5ML SOPN Inject 3 mg into the skin once a week. 3 mL 5   escitalopram (LEXAPRO) 10 MG tablet Take 10 mg by mouth daily.     FARXIGA 10 MG TABS tablet Take 10 mg by mouth daily.     fenofibrate (TRICOR) 145 MG tablet Take 1/2 tablet daily (Patient taking differently: Take 72.5 mg by mouth daily.) 30  tablet 0   furosemide (LASIX) 80 MG tablet Take 80 mg by mouth daily.     Glucosamine-Chondroit-Vit C-Mn (GLUCOSAMINE CHONDR 1500 COMPLX) CAPS Take 1 capsule by mouth 2 (two) times daily.     HUMALOG KWIKPEN 200 UNIT/ML KwikPen Inject 0-30 Units into the skin with breakfast, with lunch, and with evening meal.     hydrALAZINE (APRESOLINE) 50 MG tablet Take 1 tablet (50 mg total) by mouth every 8 (eight) hours. 90 tablet 0   hydrochlorothiazide (HYDRODIURIL) 25 MG tablet Take 25 mg by mouth daily.     insulin detemir (LEVEMIR FLEXTOUCH) 100 UNIT/ML FlexPen Inject 50 Units into the skin daily. 15 mL 11   isosorbide mononitrate (IMDUR) 30 MG 24 hr tablet Take 30 mg by mouth daily.     LANTUS SOLOSTAR 100 UNIT/ML Solostar Pen Inject 50 Units into the skin at bedtime.     losartan (COZAAR) 100 MG tablet Take 1 tablet by mouth daily.     meclizine (ANTIVERT) 25 MG tablet Take 25 mg by mouth daily as needed for dizziness.     methocarbamol (ROBAXIN) 500 MG tablet Take 1 tablet by mouth daily as needed for  muscle spasms.     Multiple Vitamin (MULTIVITAMIN) tablet Take 1 tablet by mouth daily.     oxyCODONE-acetaminophen (PERCOCET) 5-325 MG tablet Take 1 tablet by mouth every 6 (six) hours as needed for severe pain. (Patient not taking: Reported on 09/30/2022) 10 tablet 0   oxyCODONE-acetaminophen (PERCOCET) 5-325 MG tablet Take 1 tablet by mouth every 6 (six) hours as needed for severe pain. (Patient not taking: Reported on 09/30/2022) 8 tablet 0   oxyCODONE-acetaminophen (PERCOCET) 5-325 MG tablet Take 1 tablet by mouth every 6 (six) hours as needed for severe pain. (Patient not taking: Reported on 09/30/2022) 8 tablet 0   sertraline (ZOLOFT) 50 MG tablet Take 1 tablet by mouth daily.     sodium bicarbonate 650 MG tablet Take 1 tablet (650 mg total) by mouth 2 (two) times daily. 60 tablet 0   sodium chloride (OCEAN) 0.65 % SOLN nasal spray Place 1 spray into both nostrils daily as needed for congestion.      zolpidem (AMBIEN) 10 MG tablet Take 1 tablet (10 mg total) by mouth at bedtime as needed for sleep. 30 tablet 2   No current facility-administered medications for this visit.     ROS:  See HPI  Physical Exam:  Findings:  +--------------------+----------+-----------------+--------+  AVF                PSV (cm/s)Flow Vol (mL/min)Comments  +--------------------+----------+-----------------+--------+  Native artery inflow   349           329                 +--------------------+----------+-----------------+--------+  AVF Anastomosis        482                               +--------------------+----------+-----------------+--------+     +------------+----------+-------------+----------+--------+  OUTFLOW VEINPSV (cm/s)Diameter (cm)Depth (cm)Describe  +------------+----------+-------------+----------+--------+  Prox Forearm    48        0.43        0.86             +------------+----------+-------------+----------+--------+  Mid Forearm     56        0.56        0.42             +------------+----------+-------------+----------+--------+  Dist Forearm    76        0.51        0.23             +------------+----------+-------------+----------+--------+  Wrist         113        0.55        0.34             +------------+----------+-------------+----------+--------+     Summary:  Patent right RC AVF.  Volume flow 329 ml/min.  No thrombus seen.   Incision:  Well healed Extremities:  palpable thrill distal fistula, then weaker proximally     Assessment/Plan:  This is a 67 y.o. male who is s/p:Right RC AV fistula with low flow rates and diameter < 0.6 cm   -He is on HD via [facility] MWF.  I will schedule him for fistulogram with possible intervention due to slow maturity in size and decreased flow rates.  If this is not resolved he will need new access.  He agrees with this plan.  He is not medically managed on anticoagulation medications.     Kara Mead  Lianne Cure PA-C [facility] [phone]   Clinic MD:  Karin Lieu

## 2022-10-06 ENCOUNTER — Telehealth: Payer: Self-pay | Admitting: *Deleted

## 2022-10-07 ENCOUNTER — Other Ambulatory Visit: Payer: Self-pay

## 2022-10-07 DIAGNOSIS — Z992 Dependence on renal dialysis: Secondary | ICD-10-CM

## 2022-10-07 MED ORDER — SODIUM CHLORIDE 0.9% FLUSH: 3.0000 mL | Freq: Two times a day (BID) | INTRAVENOUS | Status: DC

## 2022-10-07 MED ORDER — SODIUM CHLORIDE 0.9 % IV SOLN: 250.0000 mL | INTRAVENOUS | Status: DC | PRN

## 2022-10-07 MED ORDER — SODIUM CHLORIDE 0.9% FLUSH: 3.0000 mL | INTRAVENOUS | Status: DC | PRN

## 2022-10-11 ENCOUNTER — Encounter (HOSPITAL_COMMUNITY)
Admission: RE | Disposition: A | Discharge status: Home / Self Care | Payer: Self-pay | Source: Home / Self Care | Attending: Surgery

## 2022-10-11 ENCOUNTER — Ambulatory Visit (HOSPITAL_COMMUNITY)
Admission: RE | Admit: 2022-10-11 | Discharge: 2022-10-11 | Disposition: A | Discharge status: Home / Self Care | Payer: BC Managed Care – PPO | Attending: Surgery | Admitting: Surgery

## 2022-10-11 ENCOUNTER — Other Ambulatory Visit: Payer: Self-pay

## 2022-10-11 DIAGNOSIS — N186 End stage renal disease: Secondary | ICD-10-CM | POA: Diagnosis not present

## 2022-10-11 DIAGNOSIS — T82858A Stenosis of vascular prosthetic devices, implants and grafts, initial encounter: Secondary | ICD-10-CM | POA: Diagnosis present

## 2022-10-11 DIAGNOSIS — Y832 Surgical operation with anastomosis, bypass or graft as the cause of abnormal reaction of the patient, or of later complication, without mention of misadventure at the time of the procedure: Secondary | ICD-10-CM | POA: Insufficient documentation

## 2022-10-11 DIAGNOSIS — T82898A Other specified complication of vascular prosthetic devices, implants and grafts, initial encounter: Secondary | ICD-10-CM | POA: Diagnosis not present

## 2022-10-11 DIAGNOSIS — N185 Chronic kidney disease, stage 5: Secondary | ICD-10-CM

## 2022-10-11 DIAGNOSIS — Z992 Dependence on renal dialysis: Secondary | ICD-10-CM | POA: Diagnosis not present

## 2022-10-11 HISTORY — PX: A/V FISTULAGRAM: CATH118298

## 2022-10-11 LAB — POCT I-STAT, CHEM 8
BUN: 45 mg/dL — ABNORMAL HIGH (ref 8–23)
Calcium, Ion: 1.19 mmol/L (ref 1.15–1.40)
Chloride: 98 mmol/L (ref 98–111)
Creatinine, Ser: 4.6 mg/dL — ABNORMAL HIGH (ref 0.61–1.24)
Glucose, Bld: 206 mg/dL — ABNORMAL HIGH (ref 70–99)
HCT: 31 % — ABNORMAL LOW (ref 39.0–52.0)
Hemoglobin: 10.5 g/dL — ABNORMAL LOW (ref 13.0–17.0)
Potassium: 4.2 mmol/L (ref 3.5–5.1)
Sodium: 137 mmol/L (ref 135–145)
TCO2: 26 mmol/L (ref 22–32)

## 2022-10-11 LAB — GLUCOSE, CAPILLARY: Glucose-Capillary: 212 mg/dL — ABNORMAL HIGH (ref 70–99)

## 2022-10-11 SURGERY — A/V FISTULAGRAM
Anesthesia: LOCAL | Laterality: Right

## 2022-10-11 MED ORDER — LIDOCAINE HCL (PF) 1 % IJ SOLN
INTRAMUSCULAR | Status: DC | PRN
  Administered 2022-10-11: 2 mL via INTRADERMAL

## 2022-10-11 MED ORDER — FENTANYL CITRATE (PF) 100 MCG/2ML IJ SOLN
INTRAMUSCULAR | Status: DC | PRN
  Administered 2022-10-11: 25 ug via INTRAVENOUS

## 2022-10-11 MED ORDER — LIDOCAINE HCL (PF) 1 % IJ SOLN
INTRAMUSCULAR | Status: AC
  Filled 2022-10-11: qty 30

## 2022-10-11 MED ORDER — MIDAZOLAM HCL 2 MG/2ML IJ SOLN
INTRAMUSCULAR | Status: DC | PRN
  Administered 2022-10-11: 1 mg via INTRAVENOUS

## 2022-10-11 MED ORDER — FENTANYL CITRATE (PF) 100 MCG/2ML IJ SOLN
INTRAMUSCULAR | Status: AC
  Filled 2022-10-11: qty 2

## 2022-10-11 MED ORDER — IODIXANOL 320 MG/ML IV SOLN
INTRAVENOUS | Status: DC | PRN
  Administered 2022-10-11: 45 mL via INTRA_ARTERIAL

## 2022-10-11 MED ORDER — HEPARIN (PORCINE) IN NACL 1000-0.9 UT/500ML-% IV SOLN
INTRAVENOUS | Status: DC | PRN
  Administered 2022-10-11: 500 mL

## 2022-10-11 MED ORDER — MIDAZOLAM HCL 2 MG/2ML IJ SOLN
INTRAMUSCULAR | Status: AC
  Filled 2022-10-11: qty 2

## 2022-10-11 SURGICAL SUPPLY — 8 items
BAG SNAP BAND KOVER 36X36 (MISCELLANEOUS) ×1 IMPLANT
COVER DOME SNAP 22 D (MISCELLANEOUS) ×1 IMPLANT
KIT MICROPUNCTURE NIT STIFF (SHEATH) IMPLANT
SET ATX-X65L (MISCELLANEOUS) IMPLANT
SHEATH PROBE COVER 6X72 (BAG) ×1 IMPLANT
STOPCOCK MORSE 400PSI 3WAY (MISCELLANEOUS) ×1 IMPLANT
TRAY PV CATH (CUSTOM PROCEDURE TRAY) ×1 IMPLANT
TUBING CIL FLEX 10 FLL-RA (TUBING) ×1 IMPLANT

## 2022-10-11 NOTE — Interval H&P Note (Signed)
History and Physical Interval Note:  10/11/2022 7:36 AM  Timothy Holloway  has presented today for surgery, with the diagnosis of failure to mature and low flow.  The various methods of treatment have been discussed with the patient and family. After consideration of risks, benefits and other options for treatment, the patient has consented to  Procedure(s): A/V Fistulagram (Right) as a surgical intervention.  The patient's history has been reviewed, patient examined, no change in status, stable for surgery.  I have reviewed the patient's chart and labs.  Questions were answered to the patient's satisfaction.     Durene Cal

## 2022-10-11 NOTE — Op Note (Signed)
    Patient name: Timothy Holloway MRN: [id number] DOB: Apr 08, 1955 Sex: male  10/11/2022 Pre-operative Diagnosis: Nonmaturing right radiocephalic fistula Post-operative diagnosis:  Same Surgeon:  Durene Cal Procedure Performed:  1.  Ultrasound-guided access, right cephalic vein  2.  Fistulogram  3.  Conscious sedation, 17 minutes     Indications: This is a 67 year old gentleman who was previously undergone a right radiocephalic fistula by Dr. Arbie Cookey.  He is currently dialyzing through a catheter.  The fistula has not matured and comes in today for further evaluation.  Procedure:  The patient was identified in the holding area and taken to room 8.  The patient was then placed supine on the table and prepped and draped in the usual sterile fashion.  A time out was called.  Conscious sedation was administered with the use of IV fentanyl and Versed under continuous physician and nurse monitoring.  Heart rate, blood pressure, and oxygen saturations were continuously monitored.  Total sedation time was 17 minutes ultrasound was used to evaluate the fistula.  The vein was patent and compressible.  A digital ultrasound image was acquired.  The fistula was then accessed under ultrasound guidance using a micropuncture needle.  An 018 wire was then asvanced without resistance and a micropuncture sheath was placed.  Contrast injections were then performed through the sheath.  Findings: No evidence of central venous stenosis.  The cephalic vein in the upper arm is widely patent without stenosis measuring about 5 mm.  The cephalic vein forearm has several branches but no stenosis.  The arterial venous anastomosis is widely patent     Impression:  #1  Widely patent right brachiocephalic fistula with no evidence of central venous stenosis or anastomotic stenosis.  #2  Ultrasound evaluation of the fistula reveals that it is over a centimeter deep at multiple areas.  We discussed proceeding with branch  ligation and as well as elevation of the fistula.  He was given the option of a brachiocephalic fistula, but he wishes to optimize this morning first.    Juleen China, M.D., [facility] [place] Office: [phone] Pager:  [phone]

## 2022-10-12 ENCOUNTER — Encounter (HOSPITAL_COMMUNITY): Payer: Self-pay | Admitting: Surgery

## 2022-10-13 ENCOUNTER — Telehealth: Payer: Self-pay

## 2022-10-13 NOTE — Telephone Encounter (Signed)
Attempted to reach patient at both contact numbers, to schedule surgery. Unable to leave VM.

## 2022-10-14 ENCOUNTER — Other Ambulatory Visit: Payer: Self-pay

## 2022-10-14 DIAGNOSIS — Z992 Dependence on renal dialysis: Secondary | ICD-10-CM

## 2022-11-01 ENCOUNTER — Ambulatory Visit (HOSPITAL_COMMUNITY)
Admission: RE | Admit: 2022-11-01 | Discharge: 2022-11-01 | Disposition: A | Discharge status: Home / Self Care | Payer: BC Managed Care – PPO | Source: Ambulatory Visit | Attending: Vascular Surgery | Admitting: Vascular Surgery

## 2022-11-01 ENCOUNTER — Other Ambulatory Visit: Payer: Self-pay

## 2022-11-01 ENCOUNTER — Telehealth: Payer: Self-pay

## 2022-11-01 ENCOUNTER — Ambulatory Visit (INDEPENDENT_AMBULATORY_CARE_PROVIDER_SITE_OTHER): Payer: BC Managed Care – PPO | Admitting: Physician Assistant

## 2022-11-01 VITALS — BP 168/61 | HR 62 | Temp 98.1°F | Wt 288.0 lb

## 2022-11-01 DIAGNOSIS — T82898D Other specified complication of vascular prosthetic devices, implants and grafts, subsequent encounter: Secondary | ICD-10-CM | POA: Diagnosis present

## 2022-11-01 DIAGNOSIS — N186 End stage renal disease: Secondary | ICD-10-CM

## 2022-11-01 DIAGNOSIS — Z992 Dependence on renal dialysis: Secondary | ICD-10-CM

## 2022-11-01 MED ORDER — SODIUM CHLORIDE 0.9% FLUSH: 3.0000 mL | Freq: Two times a day (BID) | INTRAVENOUS | Status: DC

## 2022-11-01 MED ORDER — HYDROCODONE-ACETAMINOPHEN 5-325 MG PO TABS: 1.0000 | ORAL_TABLET | Freq: Four times a day (QID) | ORAL | 0 refills | Status: DC | PRN

## 2022-11-01 MED ORDER — SODIUM CHLORIDE 0.9 % IV SOLN: 250.0000 mL | INTRAVENOUS | Status: DC | PRN

## 2022-11-01 NOTE — Telephone Encounter (Signed)
Joy with [facility] called with concerns about pt's hand.  Reviewed pt's chart, returned call for clarification, phone hangs up after dialing.  Called again this morning, still no answer, phone hangs up after dialing.  Joy returned call, transferred to triage. She stated that the pt was c/o pain, swelling, and the tip of his middle finger is black. The symptoms have been ongoing for less than a wk. Pt is sch for branch ligation on 10/17. Pt had the exact same symptoms on his LUE access previously before having ligation/removal. Appt sch for today @ 1300 steal [place] and [place].

## 2022-11-01 NOTE — Progress Notes (Signed)
HISTORY AND PHYSICAL     CC:  dialysis access Requesting Provider:  [facility]  HPI: This is a 67 y.o. male here for evaluation of his hemodialysis access.    Pt has hx of left BC AVF 01/11/2022 by Dr. Karin Lieu.  Pt states they had started using this in April and it was working well, but he developed an ulceration on his fingertip on the left hand and the fistula was ligated May 2024 and his ulceration and pain improved.    He subsequently underwent creation of right RC AVF on 07/26/2022 by Dr. Arbie Cookey.  Pt was seen at the end of August and his fistula was slow to mature and he underwent a fistulogram, which revealed the fistula was patent without any evidence of central venous stenosis or anastomotic stenosis but u/s did reveal it was deep and had several branches.  He was scheduled to undergo superficialization and branch ligation next week.    He comes in today with complaints of a painful ulceration on the right 2nd finger.  He states it has been present for about 2 weeks and it is not improving and actually getting worse.  He states it is similar to his previous sx on the left.    The pt is right hand dominant.    Pt is on dialysis.   Days of dialysis if applicable:  M/W/F      The pt IS on a statin for cholesterol management.  The pt IS on a daily aspirin.  Other AC:  NONE The pt IS on ARB, hydralazine, diuretic, BB for hypertension.  The pt is  on medication for diabetes.   Tobacco hx:  never  Past Medical History:  Diagnosis Date   Cataract    Chronic kidney disease (CKD), stage III (moderate) (HCC)    dr Sharl Ma   Depression    Diabetes mellitus without complication (HCC)    ESRD on dialysis [facility])    M-W-F   Hyperlipidemia    Hypertension    Impotence    Insomnia    Obesity    Steal syndrome as complication of dialysis access [facility])     Past Surgical History:  Procedure Laterality Date   A/V FISTULAGRAM Right 10/11/2022   Procedure: A/V Fistulagram;   Surgeon: Nada Libman, MD;  [facility] INV[facility];  Service: Cardiovascular;  Laterality: Right;   AV FISTULA PLACEMENT Left 01/11/2022   Procedure: LEFT ARM ARTERIOVENOUS (AV) FISTULA CREATION;  Surgeon: Victorino Sparrow, MD;  Location: [facility] OR;  Service: Vascular;  Laterality: Left;  PERIPHERAL NERVE BLOCK   AV FISTULA PLACEMENT Right 07/26/2022   Procedure: RIGHT UPPER EXTREMITY ARTERIOVENOUS (AV) FISTULA CREATION;  Surgeon: Larina Earthly, MD;  Location: [facility] ORS;  Service: Vascular;  Laterality: Right;   INSERTION OF DIALYSIS CATHETER Right 06/16/2022   Procedure: INSERTION OF RIGHT iNTERNAL Jugular TUNNELED DIALYSIS CATHETER;  Surgeon: Nada Libman, MD;  Location: [facility] OR;  Service: Vascular;  Laterality: Right;   KNEE SURGERY Left 01-31-2002   LIGATION OF ARTERIOVENOUS  FISTULA Left 06/16/2022   Procedure: LEFT ARM FISTULA LIGATION;  Surgeon: Nada Libman, MD;  Location: [facility] OR;  Service: Vascular;  Laterality: Left;   SPINE SURGERY     fusion of L4-L5    Allergies  Allergen Reactions   Latex Hives   Lodine [Etodolac] Nausea And Vomiting    Current Outpatient Medications  Medication Sig Dispense Refill   HYDROcodone-acetaminophen (NORCO) 5-325 MG tablet Take 1  tablet by mouth every 6 (six) hours as needed for moderate pain. 8 tablet 0   acetaminophen (TYLENOL) 325 MG tablet Take 650 mg by mouth every 6 (six) hours as needed for moderate pain.     aspirin 81 MG tablet Take 81 mg by mouth daily.     atorvastatin (LIPITOR) 10 MG tablet Take 1 tablet (10 mg total) by mouth daily. (Patient taking differently: Take 10 mg by mouth at bedtime.) 30 tablet 11   betamethasone dipropionate 0.05 % cream Apply 1 Application topically daily as needed (on leg).     carvedilol (COREG) 3.125 MG tablet Take 1 tablet (3.125 mg total) by mouth 2 (two) times daily with a meal. (Patient taking differently: Take 6.25 mg by mouth 2 (two) times daily with a meal.) 60 tablet 0   Cholecalciferol  (VITAMIN D) 2000 UNITS CAPS Take 2,000 Units by mouth at bedtime.     diclofenac Sodium (VOLTAREN) 1 % GEL Apply 2 g topically daily as needed (pain).     Dulaglutide (TRULICITY) 3 MG/0.5ML SOPN Inject 3 mg into the skin once a week. (Patient taking differently: Inject 3 mg into the skin once a week. Thursday) 3 mL 5   FARXIGA 10 MG TABS tablet Take 10 mg by mouth at bedtime.     fenofibrate (TRICOR) 145 MG tablet Take 1/2 tablet daily (Patient taking differently: Take 72.5 mg by mouth every morning.) 30 tablet 0   furosemide (LASIX) 80 MG tablet Take 80 mg by mouth daily.     Glucosamine-Chondroit-Vit C-Mn (GLUCOSAMINE CHONDR 1500 COMPLX) CAPS Take 1 capsule by mouth 2 (two) times daily.     HUMALOG KWIKPEN 200 UNIT/ML KwikPen Inject into the skin with breakfast, with lunch, and with evening meal. Sliding scale     hydrALAZINE (APRESOLINE) 50 MG tablet Take 1 tablet (50 mg total) by mouth every 8 (eight) hours. 90 tablet 0   hydrochlorothiazide (HYDRODIURIL) 25 MG tablet Take 25 mg by mouth daily.     isosorbide mononitrate (IMDUR) 30 MG 24 hr tablet Take 30 mg by mouth at bedtime.     LANTUS SOLOSTAR 100 UNIT/ML Solostar Pen Inject 50 Units into the skin at bedtime.     losartan (COZAAR) 100 MG tablet Take 100 mg by mouth at bedtime.     meclizine (ANTIVERT) 25 MG tablet Take 25 mg by mouth daily as needed for dizziness.     methocarbamol (ROBAXIN) 500 MG tablet Take 1 tablet by mouth daily as needed for muscle spasms.     Multiple Vitamin (MULTIVITAMIN) tablet Take 1 tablet by mouth daily.     sertraline (ZOLOFT) 50 MG tablet Take 50 mg by mouth daily as needed (mood).     sodium chloride (OCEAN) 0.65 % SOLN nasal spray Place 1 spray into both nostrils daily as needed for congestion.     zolpidem (AMBIEN) 10 MG tablet Take 1 tablet (10 mg total) by mouth at bedtime as needed for sleep. 30 tablet 2   Current Facility-Administered Medications  Medication Dose Route Frequency Provider Last Rate  Last Admin   0.9 %  sodium chloride infusion  250 mL Intravenous PRN Nada Libman, MD       0.9 %  sodium chloride infusion  250 mL Intravenous PRN Cephus Shelling, MD       sodium chloride flush (NS) 0.9 % injection 3 mL  3 mL Intravenous Q12H Nada Libman, MD       sodium chloride flush (  NS) 0.9 % injection 3 mL  3 mL Intravenous PRN Nada Libman, MD       sodium chloride flush (NS) 0.9 % injection 3 mL  3 mL Intravenous Q12H Cephus Shelling, MD        Family History  Problem Relation Age of Onset   Diabetes Mother    Cancer Father        lymphoma    Social History   Socioeconomic History   Marital status: Married    Spouse name: Not on file   Number of children: Not on file   Years of education: Not on file   Highest education level: Not on file  Occupational History   Not on file  Tobacco Use   Smoking status: Never    Passive exposure: Never   Smokeless tobacco: Not on file  Vaping Use   Vaping status: Never Used  Substance and Sexual Activity   Alcohol use: No   Drug use: No   Sexual activity: Yes    Comment: not much  Other Topics Concern   Not on file  Social History Narrative   Not on file   Social Determinants of Health   Financial Resource Strain: Low Risk  (04/22/2022)   Received [facility], [facility]   Overall Financial Resource Strain (CARDIA)    Difficulty of Paying Living Expenses: Not hard at all  Food Insecurity: No Food Insecurity (04/22/2022)   Received from [facility], [facility]   Hunger Vital Sign    Worried About Running Out of Food in the Last Year: Never true    Ran Out of Food in the Last Year: Never true  Transportation Needs: No Transportation Needs (04/22/2022)   Received from [facility], [facility]   PRAPARE - Transportation    Lack of Transportation (Medical): No    Lack of Transportation (Non-Medical): No  Physical Activity: Not on file  Stress: Not on file  Social Connections:  Unknown (06/03/2021)   Received from [facility], [facility]   Social Network    Social Network: Not on file  Intimate Partner Violence: Unknown (05/04/2021)   Received from [facility], [facility]   HITS    Physically Hurt: Not on file    Insult or Talk Down To: Not on file    Threaten Physical Harm: Not on file    Scream or Curse: Not on file     ROS: [x]  Positive   [ ]  Negative   [ ]  All sytems reviewed and are negative  Cardiac: [x]  HTN  Vascular: [x]  pain in finger right hand  Pulmonary: []  asthma []  wheezing  Neurologic: []  hx CVA/TIA  Hematologic: [x]  anemia of chronic disease  GI []  GERD  GU: [x]  CKD/renal failure  [x]  HD  Psychiatric: []  hx of major depression  Integumentary: []  rashes []  ulcers  Constitutional: []  fever []  chills   PHYSICAL EXAMINATION:  Today's Vitals   11/01/22 1335  BP: (!) 168/61  Pulse: 62  Temp: 98.1 F (36.7 C)  TempSrc: Temporal  SpO2: 95%  Weight: 288 lb (130.6 kg)  PainSc: 9    Body mass index is 39.06 kg/m.    General:  WDWN male in NAD Gait: Not observed HENT: WNL Pulmonary: normal non-labored breathing  Cardiac: regular Abdomen: soft, NT Skin: without rashes Vascular Exam/Pulses: excellent doppler signal right radial and palmar arch Extremities:  + thrill in right RC AVF; ulceration right 2nd finger   Musculoskeletal: no  muscle wasting or atrophy  Neurologic: A&O X 3  Non-Invasive Vascular Imaging:   Steal study on 11/01/2022: +---------------------------+-------+----+--------+                            Right  LeftComments  +---------------------------+-------+----+--------+  Brachial                                       +---------------------------+-------+----+--------+  Radial Ambient                                  +---------------------------+-------+----+--------+  Radial AV Compression                            +---------------------------+-------+----+--------+  Ulnar Ambient                                   +---------------------------+-------+----+--------+  Ulnar AV Compression                            +---------------------------+-------+----+--------+  2nd Digit Ambient          9 mmHg               +---------------------------+-------+----+--------+  2nd Digit Ulnar Compression16 mmHg              +---------------------------+-------+----+--------+   Summary:  Increased velocity with compression of second digit.     ASSESSMENT/PLAN: 67 y.o. male with ESRD here for evaluation for steal syndrome right hand with ulceration right 2nd finger with hx of steal left hand with ligation of left arm fistula   -discussed pt with Dr. Chestine Spore and given he has already had steal on the left arm and he has decreased velocities on his study today, will plan for arteriography of the RUE since this would be his only option for access.  We will plan for this on his non dialysis day on 11/03/2022.   -I did send Norco 5/325 one q6h prn pain #8 no refill to his pharmacy until his procedure on Thursday.     Doreatha Massed, [facility] [phone]  Clinic MD:   Chestine Spore

## 2022-11-03 ENCOUNTER — Encounter (HOSPITAL_COMMUNITY)
Admission: RE | Disposition: A | Discharge status: Home / Self Care | Payer: Self-pay | Source: Home / Self Care | Attending: Vascular Surgery

## 2022-11-03 ENCOUNTER — Ambulatory Visit (HOSPITAL_COMMUNITY)
Admission: RE | Admit: 2022-11-03 | Discharge: 2022-11-03 | Disposition: A | Discharge status: Home / Self Care | Payer: BC Managed Care – PPO | Attending: Vascular Surgery | Admitting: Vascular Surgery

## 2022-11-03 ENCOUNTER — Other Ambulatory Visit: Payer: Self-pay

## 2022-11-03 DIAGNOSIS — I120 Hypertensive chronic kidney disease with stage 5 chronic kidney disease or end stage renal disease: Secondary | ICD-10-CM | POA: Diagnosis not present

## 2022-11-03 DIAGNOSIS — Z7985 Long-term (current) use of injectable non-insulin antidiabetic drugs: Secondary | ICD-10-CM | POA: Diagnosis not present

## 2022-11-03 DIAGNOSIS — Y832 Surgical operation with anastomosis, bypass or graft as the cause of abnormal reaction of the patient, or of later complication, without mention of misadventure at the time of the procedure: Secondary | ICD-10-CM | POA: Diagnosis not present

## 2022-11-03 DIAGNOSIS — Z7982 Long term (current) use of aspirin: Secondary | ICD-10-CM | POA: Diagnosis not present

## 2022-11-03 DIAGNOSIS — N186 End stage renal disease: Secondary | ICD-10-CM | POA: Diagnosis not present

## 2022-11-03 DIAGNOSIS — T82898D Other specified complication of vascular prosthetic devices, implants and grafts, subsequent encounter: Secondary | ICD-10-CM

## 2022-11-03 DIAGNOSIS — I7025 Atherosclerosis of native arteries of other extremities with ulceration: Secondary | ICD-10-CM

## 2022-11-03 DIAGNOSIS — L98499 Non-pressure chronic ulcer of skin of other sites with unspecified severity: Secondary | ICD-10-CM | POA: Insufficient documentation

## 2022-11-03 DIAGNOSIS — Z794 Long term (current) use of insulin: Secondary | ICD-10-CM | POA: Diagnosis not present

## 2022-11-03 DIAGNOSIS — T82898A Other specified complication of vascular prosthetic devices, implants and grafts, initial encounter: Secondary | ICD-10-CM | POA: Insufficient documentation

## 2022-11-03 DIAGNOSIS — Z79899 Other long term (current) drug therapy: Secondary | ICD-10-CM | POA: Diagnosis not present

## 2022-11-03 DIAGNOSIS — Z992 Dependence on renal dialysis: Secondary | ICD-10-CM | POA: Insufficient documentation

## 2022-11-03 DIAGNOSIS — E1122 Type 2 diabetes mellitus with diabetic chronic kidney disease: Secondary | ICD-10-CM | POA: Diagnosis not present

## 2022-11-03 HISTORY — PX: UPPER EXTREMITY ANGIOGRAPHY: CATH118270

## 2022-11-03 LAB — GLUCOSE, CAPILLARY: Glucose-Capillary: 225 mg/dL — ABNORMAL HIGH (ref 70–99)

## 2022-11-03 LAB — POCT I-STAT, CHEM 8
BUN: 74 mg/dL — ABNORMAL HIGH (ref 8–23)
Calcium, Ion: 1.07 mmol/L — ABNORMAL LOW (ref 1.15–1.40)
Chloride: 98 mmol/L (ref 98–111)
Creatinine, Ser: 5.3 mg/dL — ABNORMAL HIGH (ref 0.61–1.24)
Glucose, Bld: 250 mg/dL — ABNORMAL HIGH (ref 70–99)
HCT: 33 % — ABNORMAL LOW (ref 39.0–52.0)
Hemoglobin: 11.2 g/dL — ABNORMAL LOW (ref 13.0–17.0)
Potassium: 5.5 mmol/L — ABNORMAL HIGH (ref 3.5–5.1)
Sodium: 135 mmol/L (ref 135–145)
TCO2: 26 mmol/L (ref 22–32)

## 2022-11-03 SURGERY — UPPER EXTREMITY ANGIOGRAPHY
Anesthesia: LOCAL | Laterality: Right

## 2022-11-03 MED ORDER — IODIXANOL 320 MG/ML IV SOLN
INTRAVENOUS | Status: DC | PRN
  Administered 2022-11-03: 70 mL

## 2022-11-03 MED ORDER — SODIUM CHLORIDE 0.9% FLUSH: 3.0000 mL | INTRAVENOUS | Status: DC | PRN

## 2022-11-03 MED ORDER — ACETAMINOPHEN 325 MG PO TABS: 650.0000 mg | ORAL_TABLET | ORAL | Status: DC | PRN

## 2022-11-03 MED ORDER — LIDOCAINE HCL (PF) 1 % IJ SOLN
INTRAMUSCULAR | Status: AC
  Filled 2022-11-03: qty 30

## 2022-11-03 MED ORDER — FENTANYL CITRATE (PF) 100 MCG/2ML IJ SOLN
INTRAMUSCULAR | Status: AC
  Filled 2022-11-03: qty 2

## 2022-11-03 MED ORDER — MIDAZOLAM HCL 2 MG/2ML IJ SOLN
INTRAMUSCULAR | Status: AC
  Filled 2022-11-03: qty 2

## 2022-11-03 MED ORDER — LIDOCAINE HCL (PF) 1 % IJ SOLN
INTRAMUSCULAR | Status: DC | PRN
  Administered 2022-11-03: 15 mL

## 2022-11-03 MED ORDER — HEPARIN SODIUM (PORCINE) 1000 UNIT/ML IJ SOLN
INTRAMUSCULAR | Status: DC | PRN
  Administered 2022-11-03: 5000 [IU] via INTRAVENOUS

## 2022-11-03 MED ORDER — HYDRALAZINE HCL 20 MG/ML IJ SOLN: 5.0000 mg | INTRAMUSCULAR | Status: DC | PRN

## 2022-11-03 MED ORDER — HEPARIN (PORCINE) IN NACL 1000-0.9 UT/500ML-% IV SOLN
INTRAVENOUS | Status: DC | PRN
  Administered 2022-11-03 (×2): 500 mL

## 2022-11-03 MED ORDER — FENTANYL CITRATE (PF) 100 MCG/2ML IJ SOLN
INTRAMUSCULAR | Status: DC | PRN
  Administered 2022-11-03: 25 ug via INTRAVENOUS

## 2022-11-03 MED ORDER — SODIUM CHLORIDE 0.9% FLUSH: 3.0000 mL | Freq: Two times a day (BID) | INTRAVENOUS | Status: DC

## 2022-11-03 MED ORDER — ONDANSETRON HCL 4 MG/2ML IJ SOLN: 4.0000 mg | Freq: Four times a day (QID) | INTRAMUSCULAR | Status: DC | PRN

## 2022-11-03 MED ORDER — SODIUM CHLORIDE 0.9 % IV SOLN: 250.0000 mL | INTRAVENOUS | Status: DC | PRN

## 2022-11-03 MED ORDER — HEPARIN SODIUM (PORCINE) 1000 UNIT/ML IJ SOLN
INTRAMUSCULAR | Status: AC
  Filled 2022-11-03: qty 10

## 2022-11-03 MED ORDER — LABETALOL HCL 5 MG/ML IV SOLN: 10.0000 mg | INTRAVENOUS | Status: DC | PRN

## 2022-11-03 MED ORDER — MIDAZOLAM HCL 2 MG/2ML IJ SOLN
INTRAMUSCULAR | Status: DC | PRN
  Administered 2022-11-03: 1 mg via INTRAVENOUS

## 2022-11-03 SURGICAL SUPPLY — 14 items
CATH ANGIO 5F PIGTAIL 100CM (CATHETERS) IMPLANT
CATH HEADHUNTER 5F 125CM (CATHETERS) IMPLANT
CATH INFINITI VERT 5FR 125CM (CATHETERS) IMPLANT
COVER DOME SNAP 22 D (MISCELLANEOUS) IMPLANT
DEVICE CLOSURE MYNXGRIP 5F (Vascular Products) IMPLANT
GUIDEWIRE ANGLED .035X260CM (WIRE) IMPLANT
KIT MICROPUNCTURE NIT STIFF (SHEATH) IMPLANT
KIT SINGLE USE MANIFOLD (KITS) IMPLANT
SET ATX-X65L (MISCELLANEOUS) IMPLANT
SHEATH PINNACLE 5F 10CM (SHEATH) IMPLANT
SHEATH PROBE COVER 6X72 (BAG) IMPLANT
TRAY PV CATH (CUSTOM PROCEDURE TRAY) ×1 IMPLANT
WIRE BENTSON .035X145CM (WIRE) IMPLANT
WIRE HI TORQ VERSACORE 300 (WIRE) IMPLANT

## 2022-11-03 NOTE — H&P (Signed)
History and Physical Interval Note:  11/03/2022 8:34 AM  Timothy Holloway  has presented today for surgery, with the diagnosis of complication of dialysis access.  The various methods of treatment have been discussed with the patient and family. After consideration of risks, benefits and other options for treatment, the patient has consented to  Procedure(s): Upper Extremity Angiography (N/A) as a surgical intervention.  The patient's history has been reviewed, patient examined, no change in status, stable for surgery.  I have reviewed the patient's chart and labs.  Questions were answered to the patient's satisfaction.     Cephus Shelling  HISTORY AND PHYSICAL        CC:  dialysis access Requesting Provider:  [facility]   HPI: This is a 67 y.o. male here for evaluation of his hemodialysis access.     Pt has hx of left BC AVF 01/11/2022 by Dr. Karin Lieu.  Pt states they had started using this in April and it was working well, but he developed an ulceration on his fingertip on the left hand and the fistula was ligated May 2024 and his ulceration and pain improved.     He subsequently underwent creation of right RC AVF on 07/26/2022 by Dr. Arbie Cookey.  Pt was seen at the end of August and his fistula was slow to mature and he underwent a fistulogram, which revealed the fistula was patent without any evidence of central venous stenosis or anastomotic stenosis but u/s did reveal it was deep and had several branches.  He was scheduled to undergo superficialization and branch ligation next week.     He comes in today with complaints of a painful ulceration on the right 2nd finger.  He states it has been present for about 2 weeks and it is not improving and actually getting worse.  He states it is similar to his previous sx on the left.      The pt is right hand dominant.    Pt is on dialysis.   Days of dialysis if applicable:  M/W/F        The pt IS on a statin for cholesterol  management.  The pt IS on a daily aspirin.  Other AC:  NONE The pt IS on ARB, hydralazine, diuretic, BB for hypertension.  The pt is  on medication for diabetes.   Tobacco hx:  never       Past Medical History:  Diagnosis Date   Cataract     Chronic kidney disease (CKD), stage III (moderate) (HCC)      dr Sharl Ma   Depression     Diabetes mellitus without complication (HCC)     ESRD on dialysis [facility])      M-W-F   Hyperlipidemia     Hypertension     Impotence     Insomnia     Obesity     Steal syndrome as complication of dialysis access [facility])                 Past Surgical History:  Procedure Laterality Date   A/V FISTULAGRAM Right 10/11/2022    Procedure: A/V Fistulagram;  Surgeon: Nada Libman, MD;  [facility] INV[facility] CV LAB;  Service: Cardiovascular;  Laterality: Right;   AV FISTULA PLACEMENT Left 01/11/2022    Procedure: LEFT ARM ARTERIOVENOUS (AV) FISTULA CREATION;  Surgeon: Victorino Sparrow, MD;  Location: [facility] OR;  Service: Vascular;  Laterality: Left;  PERIPHERAL NERVE BLOCK   AV FISTULA PLACEMENT Right  07/26/2022    Procedure: RIGHT UPPER EXTREMITY ARTERIOVENOUS (AV) FISTULA CREATION;  Surgeon: Larina Earthly, MD;  Location: [facility] ORS;  Service: Vascular;  Laterality: Right;   INSERTION OF DIALYSIS CATHETER Right 06/16/2022    Procedure: INSERTION OF RIGHT iNTERNAL Jugular TUNNELED DIALYSIS CATHETER;  Surgeon: Nada Libman, MD;  Location: [facility] OR;  Service: Vascular;  Laterality: Right;   KNEE SURGERY Left 01-31-2002   LIGATION OF ARTERIOVENOUS  FISTULA Left 06/16/2022    Procedure: LEFT ARM FISTULA LIGATION;  Surgeon: Nada Libman, MD;  Location: [facility] OR;  Service: Vascular;  Laterality: Left;   SPINE SURGERY        fusion of L4-L5          Allergies      Allergies  Allergen Reactions   Latex Hives   Lodine [Etodolac] Nausea And Vomiting              Current Outpatient Medications  Medication Sig Dispense Refill   HYDROcodone-acetaminophen (NORCO) 5-325  MG tablet Take 1 tablet by mouth every 6 (six) hours as needed for moderate pain. 8 tablet 0   acetaminophen (TYLENOL) 325 MG tablet Take 650 mg by mouth every 6 (six) hours as needed for moderate pain.       aspirin 81 MG tablet Take 81 mg by mouth daily.       atorvastatin (LIPITOR) 10 MG tablet Take 1 tablet (10 mg total) by mouth daily. (Patient taking differently: Take 10 mg by mouth at bedtime.) 30 tablet 11   betamethasone dipropionate 0.05 % cream Apply 1 Application topically daily as needed (on leg).       carvedilol (COREG) 3.125 MG tablet Take 1 tablet (3.125 mg total) by mouth 2 (two) times daily with a meal. (Patient taking differently: Take 6.25 mg by mouth 2 (two) times daily with a meal.) 60 tablet 0   Cholecalciferol (VITAMIN D) 2000 UNITS CAPS Take 2,000 Units by mouth at bedtime.       diclofenac Sodium (VOLTAREN) 1 % GEL Apply 2 g topically daily as needed (pain).       Dulaglutide (TRULICITY) 3 MG/0.5ML SOPN Inject 3 mg into the skin once a week. (Patient taking differently: Inject 3 mg into the skin once a week. Thursday) 3 mL 5   FARXIGA 10 MG TABS tablet Take 10 mg by mouth at bedtime.       fenofibrate (TRICOR) 145 MG tablet Take 1/2 tablet daily (Patient taking differently: Take 72.5 mg by mouth every morning.) 30 tablet 0   furosemide (LASIX) 80 MG tablet Take 80 mg by mouth daily.       Glucosamine-Chondroit-Vit C-Mn (GLUCOSAMINE CHONDR 1500 COMPLX) CAPS Take 1 capsule by mouth 2 (two) times daily.       HUMALOG KWIKPEN 200 UNIT/ML KwikPen Inject into the skin with breakfast, with lunch, and with evening meal. Sliding scale       hydrALAZINE (APRESOLINE) 50 MG tablet Take 1 tablet (50 mg total) by mouth every 8 (eight) hours. 90 tablet 0   hydrochlorothiazide (HYDRODIURIL) 25 MG tablet Take 25 mg by mouth daily.       isosorbide mononitrate (IMDUR) 30 MG 24 hr tablet Take 30 mg by mouth at bedtime.       LANTUS SOLOSTAR 100 UNIT/ML Solostar Pen Inject 50 Units into the  skin at bedtime.       losartan (COZAAR) 100 MG tablet Take 100 mg by mouth at bedtime.  meclizine (ANTIVERT) 25 MG tablet Take 25 mg by mouth daily as needed for dizziness.       methocarbamol (ROBAXIN) 500 MG tablet Take 1 tablet by mouth daily as needed for muscle spasms.       Multiple Vitamin (MULTIVITAMIN) tablet Take 1 tablet by mouth daily.       sertraline (ZOLOFT) 50 MG tablet Take 50 mg by mouth daily as needed (mood).       sodium chloride (OCEAN) 0.65 % SOLN nasal spray Place 1 spray into both nostrils daily as needed for congestion.       zolpidem (AMBIEN) 10 MG tablet Take 1 tablet (10 mg total) by mouth at bedtime as needed for sleep. 30 tablet 2               Current Facility-Administered Medications  Medication Dose Route Frequency Provider Last Rate Last Admin   0.9 %  sodium chloride infusion  250 mL Intravenous PRN Nada Libman, MD       0.9 %  sodium chloride infusion  250 mL Intravenous PRN Cephus Shelling, MD       sodium chloride flush (NS) 0.9 % injection 3 mL  3 mL Intravenous Q12H Nada Libman, MD       sodium chloride flush (NS) 0.9 % injection 3 mL  3 mL Intravenous PRN Nada Libman, MD       sodium chloride flush (NS) 0.9 % injection 3 mL  3 mL Intravenous Q12H Cephus Shelling, MD                 Family History  Problem Relation Age of Onset   Diabetes Mother     Cancer Father          lymphoma          Social History         Socioeconomic History   Marital status: Married      Spouse name: Not on file   Number of children: Not on file   Years of education: Not on file   Highest education level: Not on file  Occupational History   Not on file  Tobacco Use   Smoking status: Never      Passive exposure: Never   Smokeless tobacco: Not on file  Vaping Use   Vaping status: Never Used  Substance and Sexual Activity   Alcohol use: No   Drug use: No   Sexual activity: Yes      Comment: not much  Other Topics  Concern   Not on file  Social History Narrative   Not on file    Social Determinants of Health        Financial Resource Strain: Low Risk  (04/22/2022)    Received from [facility], [facility]    Overall Financial Resource Strain (CARDIA)     Difficulty of Paying Living Expenses: Not hard at all  Food Insecurity: No Food Insecurity (04/22/2022)    Received from [facility], [facility]    Hunger Vital Sign     Worried About Running Out of Food in the Last Year: Never true     Ran Out of Food in the Last Year: Never true  Transportation Needs: No Transportation Needs (04/22/2022)    Received from [facility], [facility]    PRAPARE - Transportation     Lack of Transportation (Medical): No     Lack of Transportation (Non-Medical): No  Physical Activity:  Not on file  Stress: Not on file  Social Connections: Unknown (06/03/2021)    Received from [facility], [facility]    Social Network     Social Network: Not on file  Intimate Partner Violence: Unknown (05/04/2021)    Received from [facility], [facility]    HITS     Physically Hurt: Not on file     Insult or Talk Down To: Not on file     Threaten Physical Harm: Not on file     Scream or Curse: Not on file        ROS: [x]  Positive   [ ]  Negative   [ ]  All sytems reviewed and are negative   Cardiac: [x]  HTN   Vascular: [x]  pain in finger right hand   Pulmonary: []  asthma []  wheezing   Neurologic: []  hx CVA/TIA   Hematologic: [x]  anemia of chronic disease   GI []  GERD   GU: [x]  CKD/renal failure  [x]  HD   Psychiatric: []  hx of major depression   Integumentary: []  rashes []  ulcers   Constitutional: []  fever []  chills     PHYSICAL EXAMINATION:      Today's Vitals    11/01/22 1335  BP: (!) 168/61  Pulse: 62  Temp: 98.1 F (36.7 C)  TempSrc: Temporal  SpO2: 95%  Weight: 288 lb (130.6 kg)  PainSc: 9     Body mass index is 39.06 kg/m.       General:  WDWN male in  NAD Gait: Not observed HENT: WNL Pulmonary: normal non-labored breathing  Cardiac: regular Abdomen: soft, NT Skin: without rashes Vascular Exam/Pulses: excellent doppler signal right radial and palmar arch Extremities:  + thrill in right RC AVF; ulceration right 2nd finger   Musculoskeletal: no muscle wasting or atrophy       Neurologic: A&O X 3   Non-Invasive Vascular Imaging:   Steal study on 11/01/2022: +---------------------------+-------+----+--------+                            Right  LeftComments  +---------------------------+-------+----+--------+  Brachial                                       +---------------------------+-------+----+--------+  Radial Ambient                                  +---------------------------+-------+----+--------+  Radial AV Compression                           +---------------------------+-------+----+--------+  Ulnar Ambient                                   +---------------------------+-------+----+--------+  Ulnar AV Compression                            +---------------------------+-------+----+--------+  2nd Digit Ambient          9 mmHg               +---------------------------+-------+----+--------+  2nd Digit Ulnar Compression16 mmHg              +---------------------------+-------+----+--------+   Summary:  Increased velocity with compression  of second digit.        ASSESSMENT/PLAN: 67 y.o. male with ESRD here for evaluation for steal syndrome right hand with ulceration right 2nd finger with hx of steal left hand with ligation of left arm fistula     -discussed pt with Dr. Chestine Spore and given he has already had steal on the left arm and he has decreased velocities on his study today, will plan for arteriography of the RUE since this would be his only option for access.  We will plan for this on his non dialysis day on 11/03/2022.   -I did send Norco 5/325 one q6h prn pain #8 no refill to  his pharmacy until his procedure on Thursday.       Doreatha Massed, [facility] [phone]   Clinic MD:   Chestine Spore

## 2022-11-03 NOTE — Op Note (Signed)
Patient name: Timothy Holloway MRN: [id number] DOB: Jul 31, 1955 Sex: male  11/03/2022 Pre-operative Diagnosis: Ulcer right middle finger with radiocephalic fistula and end-stage renal disease Post-operative diagnosis:  Same Surgeon:  Cephus Shelling, MD Procedure Performed: 1.  Ultrasound-guided access right common femoral artery 2.  Arch aortogram with catheter selection of ascending aorta 3.  Right upper extremity arteriogram with catheter selection of the right subclavian artery 4.  Mynx closure of the right common femoral artery 5.  40 minutes of monitored moderate conscious sedation time  Indications: 67 year old male who recently had a right radiocephalic AV fistula on 07/26/2022 by Dr. Arbie Cookey for end-stage renal disease.  He developed an ulcer on the right middle finger.  He has already had a fistula ligated in the left arm for steal syndrome with tissue loss.  Ultimately he presents for right upper extremity angiogram to evaluate for inflow disease after risks benefits discussed.  Findings:   Ultrasound-guided access right common femoral artery.  Patient had a type II arch.  All the arch vessels were patent.  The innominate was widely patent.  There was some plaque in the proximal right subclavian artery and we looked at this in different views and it was not flow limiting and did a pullback pressure with no gradient.  We then put a catheter in the right subclavian artery and shot right upper extremity runoff showing a patent axillary, brachial, radial and ulnar artery.  The radiocephalic AVF at the wrist steals all of the blood flow from the radial artery and there is no in-line flow to the hand through the radial artery.  The ulnar artery was patent and filling of palmar arch with retrograde flow into the radial.  Small vessel disease in the hand with no filling of the digital arteries.   Procedure:  The patient was identified in the holding area and taken to room 8.  The patient  was then placed supine on the table and prepped and draped in the usual sterile fashion.  A time out was called.  Ultrasound was used to evaluate the right common femoral artery.  It was patent .  A digital ultrasound image was acquired.  A micropuncture needle was used to access the right common femoral artery under ultrasound guidance.  An 018 wire was advanced without resistance and a micropuncture sheath was placed.  The 018 wire was removed and a benson wire was placed.  The micropuncture sheath was exchanged for a 5 french sheath.  Patient was given 5000 units of IV heparin.  Advanced a pigtail catheter into the ascending aorta.  An arch aortogram was obtained.  Pertinent findings are noted above.  I then used a vert catheter and switched to a headhunter catheter to cannulate the innominate and then I was able to get into the subclavian artery on the right.  We then got right upper extremity arteriogram with runoff to the hand with pertinent findings noted above.  I did a pullback pressure through the subclavian artery as there was some plaque at the proximal vessel but there was no gradient.  No inflow disease identified.  Radiocephalic fistula at the wrist steals all of the inflow from the radial artery.  Wires and catheters were removed.  A mynx closure was deployed in the right groin.  Plan: Patient will be scheduled for ligation of his right radiocephalic fistula.  I do not see any significant inflow disease to treat.  Cephus Shelling, MD [facility] [place] Office: [phone]

## 2022-11-03 NOTE — Progress Notes (Signed)
Patient was given discharge instructions. He verbalized understanding. 

## 2022-11-04 ENCOUNTER — Encounter (HOSPITAL_COMMUNITY): Payer: Self-pay | Admitting: Vascular Surgery

## 2022-11-04 NOTE — Addendum Note (Signed)
Addended by: Cherene Julian B on: 11/04/2022 01:37 PM   Modules accepted: Orders

## 2022-11-07 ENCOUNTER — Encounter (HOSPITAL_COMMUNITY): Payer: Self-pay | Admitting: Vascular Surgery

## 2022-11-07 ENCOUNTER — Other Ambulatory Visit: Payer: Self-pay

## 2022-11-07 NOTE — Progress Notes (Addendum)
I was able to get in touch with Mr. Catala while he was on dialysis, patient denies chest pain or shortness of breath.  Patient denies having any s/s of Covid in his household, also denies any known exposure to Covid.  Mr. Prehn denies  any s/s of upper or lower respiratory infection in the past 8 weeks.   Mr. Compston PCP is [facility], [place], [place], [place]. Mr. Benay Pillow checks [facility] 1 time a day, patient reports, "it has been high recently, more than 200."  Mr. Vallery reports that he has been out of Lantus for a while, dose was changed and he was taking more than before and ran out, the pharmacy could not fill it because [employer] would not approve pat getting a few days early.  Mrs. Coomes said that Mr. Gervasi will not ask at the dialysis center, she asked me to intervene.  [facility], I could not get anyone to answer. I called Mr. Hagan's PCP office, I was put in line for someone to call me back, I left why I am calling on the voice mail.  I did not receive a from PCP

## 2022-11-08 ENCOUNTER — Ambulatory Visit (HOSPITAL_COMMUNITY): Payer: BC Managed Care – PPO | Admitting: Anesthesiology

## 2022-11-08 ENCOUNTER — Other Ambulatory Visit: Payer: Self-pay

## 2022-11-08 ENCOUNTER — Encounter (HOSPITAL_COMMUNITY)
Admission: RE | Disposition: A | Discharge status: Home / Self Care | Payer: Self-pay | Source: Home / Self Care | Attending: Vascular Surgery

## 2022-11-08 ENCOUNTER — Encounter (HOSPITAL_COMMUNITY): Payer: Self-pay | Admitting: Vascular Surgery

## 2022-11-08 ENCOUNTER — Ambulatory Visit (HOSPITAL_COMMUNITY)
Admission: RE | Admit: 2022-11-08 | Discharge: 2022-11-08 | Disposition: A | Discharge status: Home / Self Care | Payer: BC Managed Care – PPO | Attending: Vascular Surgery | Admitting: Vascular Surgery

## 2022-11-08 DIAGNOSIS — Z79899 Other long term (current) drug therapy: Secondary | ICD-10-CM | POA: Insufficient documentation

## 2022-11-08 DIAGNOSIS — Z6839 Body mass index (BMI) 39.0-39.9, adult: Secondary | ICD-10-CM | POA: Insufficient documentation

## 2022-11-08 DIAGNOSIS — Y832 Surgical operation with anastomosis, bypass or graft as the cause of abnormal reaction of the patient, or of later complication, without mention of misadventure at the time of the procedure: Secondary | ICD-10-CM | POA: Diagnosis not present

## 2022-11-08 DIAGNOSIS — N186 End stage renal disease: Secondary | ICD-10-CM | POA: Diagnosis not present

## 2022-11-08 DIAGNOSIS — Z992 Dependence on renal dialysis: Secondary | ICD-10-CM | POA: Diagnosis not present

## 2022-11-08 DIAGNOSIS — Z833 Family history of diabetes mellitus: Secondary | ICD-10-CM | POA: Insufficient documentation

## 2022-11-08 DIAGNOSIS — Z7985 Long-term (current) use of injectable non-insulin antidiabetic drugs: Secondary | ICD-10-CM | POA: Insufficient documentation

## 2022-11-08 DIAGNOSIS — Z794 Long term (current) use of insulin: Secondary | ICD-10-CM | POA: Insufficient documentation

## 2022-11-08 DIAGNOSIS — E1122 Type 2 diabetes mellitus with diabetic chronic kidney disease: Secondary | ICD-10-CM | POA: Diagnosis not present

## 2022-11-08 DIAGNOSIS — T82898A Other specified complication of vascular prosthetic devices, implants and grafts, initial encounter: Secondary | ICD-10-CM | POA: Insufficient documentation

## 2022-11-08 DIAGNOSIS — N185 Chronic kidney disease, stage 5: Secondary | ICD-10-CM | POA: Diagnosis not present

## 2022-11-08 DIAGNOSIS — Z7982 Long term (current) use of aspirin: Secondary | ICD-10-CM | POA: Diagnosis not present

## 2022-11-08 DIAGNOSIS — I120 Hypertensive chronic kidney disease with stage 5 chronic kidney disease or end stage renal disease: Secondary | ICD-10-CM | POA: Diagnosis not present

## 2022-11-08 HISTORY — DX: Anemia, unspecified: D64.9

## 2022-11-08 HISTORY — DX: Acute embolism and thrombosis of unspecified deep veins of unspecified lower extremity: I82.409

## 2022-11-08 HISTORY — PX: LIGATION OF ARTERIOVENOUS  FISTULA: SHX5948

## 2022-11-08 LAB — POCT I-STAT, CHEM 8
BUN: 47 mg/dL — ABNORMAL HIGH (ref 8–23)
Calcium, Ion: 1.12 mmol/L — ABNORMAL LOW (ref 1.15–1.40)
Chloride: 100 mmol/L (ref 98–111)
Creatinine, Ser: 5.6 mg/dL — ABNORMAL HIGH (ref 0.61–1.24)
Glucose, Bld: 169 mg/dL — ABNORMAL HIGH (ref 70–99)
HCT: 32 % — ABNORMAL LOW (ref 39.0–52.0)
Hemoglobin: 10.9 g/dL — ABNORMAL LOW (ref 13.0–17.0)
Potassium: 4 mmol/L (ref 3.5–5.1)
Sodium: 139 mmol/L (ref 135–145)
TCO2: 24 mmol/L (ref 22–32)

## 2022-11-08 LAB — GLUCOSE, CAPILLARY
Glucose-Capillary: 184 mg/dL — ABNORMAL HIGH (ref 70–99)
Glucose-Capillary: 157 mg/dL — ABNORMAL HIGH (ref 70–99)
Glucose-Capillary: 117 mg/dL — ABNORMAL HIGH (ref 70–99)
Glucose-Capillary: 126 mg/dL — ABNORMAL HIGH (ref 70–99)

## 2022-11-08 SURGERY — LIGATION OF ARTERIOVENOUS  FISTULA
Anesthesia: Monitor Anesthesia Care | Site: Arm Upper | Laterality: Right

## 2022-11-08 MED ORDER — HYDROCODONE-ACETAMINOPHEN 5-325 MG PO TABS: 1.0000 | ORAL_TABLET | Freq: Four times a day (QID) | ORAL | 0 refills | Status: DC | PRN

## 2022-11-08 MED ORDER — HEPARIN 6000 UNIT IRRIGATION SOLUTION
Status: AC
  Filled 2022-11-08: qty 500

## 2022-11-08 MED ORDER — FENTANYL CITRATE (PF) 100 MCG/2ML IJ SOLN: 25.0000 ug | INTRAMUSCULAR | Status: DC | PRN

## 2022-11-08 MED ORDER — INSULIN ASPART 100 UNIT/ML IJ SOLN: 0.0000 [IU] | INTRAMUSCULAR | Status: DC | PRN

## 2022-11-08 MED ORDER — INSULIN ASPART 100 UNIT/ML IJ SOLN
INTRAMUSCULAR | Status: AC
  Administered 2022-11-08: 4 [IU] via SUBCUTANEOUS
  Filled 2022-11-08: qty 1

## 2022-11-08 MED ORDER — SODIUM CHLORIDE 0.9 % IV SOLN: Freq: Once | INTRAVENOUS | Status: AC

## 2022-11-08 MED ORDER — CHLORHEXIDINE GLUCONATE 4 % EX SOLN: 60.0000 mL | Freq: Once | CUTANEOUS | Status: DC

## 2022-11-08 MED ORDER — ONDANSETRON HCL 4 MG/2ML IJ SOLN: 4.0000 mg | Freq: Once | INTRAMUSCULAR | Status: DC | PRN

## 2022-11-08 MED ORDER — 0.9 % SODIUM CHLORIDE (POUR BTL) OPTIME
TOPICAL | Status: DC | PRN
  Administered 2022-11-08: 1000 mL

## 2022-11-08 MED ORDER — MIDAZOLAM HCL 2 MG/2ML IJ SOLN: 1.0000 mg | Freq: Once | INTRAMUSCULAR | Status: AC

## 2022-11-08 MED ORDER — MIDAZOLAM HCL 2 MG/2ML IJ SOLN
INTRAMUSCULAR | Status: AC
  Filled 2022-11-08: qty 2

## 2022-11-08 MED ORDER — CEFAZOLIN IN SODIUM CHLORIDE 3-0.9 GM/100ML-% IV SOLN
3.0000 g | INTRAVENOUS | Status: AC
  Administered 2022-11-08: 3 g via INTRAVENOUS
  Filled 2022-11-08: qty 100

## 2022-11-08 MED ORDER — OXYCODONE HCL 5 MG/5ML PO SOLN: 5.0000 mg | Freq: Once | ORAL | Status: DC | PRN

## 2022-11-08 MED ORDER — PROPOFOL 10 MG/ML IV BOLUS
INTRAVENOUS | Status: DC | PRN
  Administered 2022-11-08: 10 mg via INTRAVENOUS

## 2022-11-08 MED ORDER — HEPARIN 6000 UNIT IRRIGATION SOLUTION
Status: DC | PRN
  Administered 2022-11-08: 1

## 2022-11-08 MED ORDER — LIDOCAINE-EPINEPHRINE (PF) 1.5 %-1:200000 IJ SOLN
INTRAMUSCULAR | Status: DC | PRN
Start: 2022-11-08 — End: 2022-11-08
  Administered 2022-11-08: 25 mL via PERINEURAL

## 2022-11-08 MED ORDER — FENTANYL CITRATE (PF) 100 MCG/2ML IJ SOLN: 100.0000 ug | Freq: Once | INTRAMUSCULAR | Status: AC

## 2022-11-08 MED ORDER — OXYCODONE HCL 5 MG PO TABS: 5.0000 mg | ORAL_TABLET | Freq: Once | ORAL | Status: DC | PRN

## 2022-11-08 MED ORDER — SODIUM CHLORIDE 0.9 % IV SOLN: INTRAVENOUS | Status: DC

## 2022-11-08 MED ORDER — PROPOFOL 500 MG/50ML IV EMUL
INTRAVENOUS | Status: DC | PRN
  Administered 2022-11-08: 40 ug/kg/min via INTRAVENOUS

## 2022-11-08 MED ORDER — PHENYLEPHRINE HCL-NACL 20-0.9 MG/250ML-% IV SOLN
INTRAVENOUS | Status: DC | PRN
  Administered 2022-11-08: 25 ug/min via INTRAVENOUS

## 2022-11-08 MED ORDER — ACETAMINOPHEN 500 MG PO TABS
1000.0000 mg | ORAL_TABLET | Freq: Once | ORAL | Status: AC
  Administered 2022-11-08: 1000 mg via ORAL
  Filled 2022-11-08: qty 2

## 2022-11-08 MED ORDER — LIDOCAINE HCL (PF) 1 % IJ SOLN
INTRAMUSCULAR | Status: AC
  Filled 2022-11-08: qty 30

## 2022-11-08 MED ORDER — CHLORHEXIDINE GLUCONATE 0.12 % MT SOLN
15.0000 mL | OROMUCOSAL | Status: AC
  Administered 2022-11-08: 15 mL via OROMUCOSAL
  Filled 2022-11-08 (×2): qty 15

## 2022-11-08 MED ORDER — FENTANYL CITRATE (PF) 100 MCG/2ML IJ SOLN
INTRAMUSCULAR | Status: AC
  Administered 2022-11-08: 100 ug via INTRAVENOUS
  Filled 2022-11-08: qty 2

## 2022-11-08 MED ORDER — MIDAZOLAM HCL 2 MG/2ML IJ SOLN
INTRAMUSCULAR | Status: AC
  Administered 2022-11-08: 1 mg via INTRAVENOUS
  Filled 2022-11-08: qty 2

## 2022-11-08 SURGICAL SUPPLY — 34 items
ADH SKN CLS APL DERMABOND .7 (GAUZE/BANDAGES/DRESSINGS) ×2
ARMBAND PINK RESTRICT EXTREMIT (MISCELLANEOUS) ×2 IMPLANT
BAG COUNTER SPONGE SURGICOUNT (BAG) ×2 IMPLANT
BAG SPNG CNTER NS LX DISP (BAG) ×2
CANISTER SUCT 3000ML PPV (MISCELLANEOUS) ×2 IMPLANT
CLIP LIGATING EXTRA MED SLVR (CLIP) ×2 IMPLANT
CLIP LIGATING EXTRA SM BLUE (MISCELLANEOUS) ×2 IMPLANT
COVER PROBE W GEL 5X96 (DRAPES) ×2 IMPLANT
DERMABOND ADVANCED .7 DNX12 (GAUZE/BANDAGES/DRESSINGS) ×2 IMPLANT
ELECT REM PT RETURN 9FT ADLT (ELECTROSURGICAL) ×2
ELECTRODE REM PT RTRN 9FT ADLT (ELECTROSURGICAL) ×2 IMPLANT
GLOVE BIO SURGEON STRL SZ7.5 (GLOVE) ×2 IMPLANT
GOWN STRL REUS W/ TWL LRG LVL3 (GOWN DISPOSABLE) ×4 IMPLANT
GOWN STRL REUS W/ TWL XL LVL3 (GOWN DISPOSABLE) ×2 IMPLANT
GOWN STRL REUS W/TWL LRG LVL3 (GOWN DISPOSABLE) ×4
GOWN STRL REUS W/TWL XL LVL3 (GOWN DISPOSABLE) ×2
KIT BASIN OR (CUSTOM PROCEDURE TRAY) ×2 IMPLANT
KIT TURNOVER KIT B (KITS) ×2 IMPLANT
NS IRRIG 1000ML POUR BTL (IV SOLUTION) ×2 IMPLANT
PACK CV ACCESS (CUSTOM PROCEDURE TRAY) ×2 IMPLANT
PAD ARMBOARD 7.5X6 YLW CONV (MISCELLANEOUS) ×4 IMPLANT
POWDER SURGICEL 3.0 GRAM (HEMOSTASIS) IMPLANT
SLING ARM FOAM STRAP LRG (SOFTGOODS) IMPLANT
SLING ARM FOAM STRAP MED (SOFTGOODS) IMPLANT
SUT ETHILON 3 0 PS 1 (SUTURE) IMPLANT
SUT MNCRL AB 4-0 PS2 18 (SUTURE) ×2 IMPLANT
SUT PROLENE 5 0 C 1 24 (SUTURE) ×1 IMPLANT
SUT PROLENE 6 0 BV (SUTURE) ×2 IMPLANT
SUT SILK 0 TIES 10X30 (SUTURE) ×2 IMPLANT
SUT VIC AB 3-0 SH 27 (SUTURE) ×2
SUT VIC AB 3-0 SH 27X BRD (SUTURE) ×2 IMPLANT
TOWEL GREEN STERILE (TOWEL DISPOSABLE) ×2 IMPLANT
UNDERPAD 30X36 HEAVY ABSORB (UNDERPADS AND DIAPERS) ×2 IMPLANT
WATER STERILE IRR 1000ML POUR (IV SOLUTION) ×2 IMPLANT

## 2022-11-08 NOTE — Anesthesia Procedure Notes (Signed)
Anesthesia Regional Block: Supraclavicular block   Pre-Anesthetic Checklist: , timeout performed,  Correct Patient, Correct Site, Correct Laterality,  Correct Procedure, Correct Position, site marked,  Risks and benefits discussed,  Surgical consent,  Pre-op evaluation,  At surgeon's request and post-op pain management  Laterality: Right  Prep: Maximum Sterile Barrier Precautions used, chloraprep       Needles:  Injection technique: Single-shot  Needle Type: Echogenic Stimulator Needle     Needle Length: 9cm  Needle Gauge: 22     Additional Needles:   Procedures:,,,, ultrasound used (permanent image in chart),,    Narrative:  Start time: 11/08/2022 11:30 AM End time: 11/08/2022 11:35 AM Injection made incrementally with aspirations every 5 mL.  Performed by: Personally  Anesthesiologist: Lannie Fields, DO  Additional Notes: Monitors applied. No increased pain on injection. No increased resistance to injection. Injection made in 5cc increments. Good needle visualization. Patient tolerated procedure well.

## 2022-11-08 NOTE — Discharge Instructions (Signed)
[facility]  Discharge Instructions  AV Fistula or Graft Surgery for Dialysis Access  Please refer to the following instructions for your post-procedure care. Your surgeon or physician assistant will discuss any changes with you.  Activity  You may drive the day following your surgery, if you are comfortable and no longer taking prescription pain medication. Resume full activity as the soreness in your incision resolves.   Incision Care  Clean your incision with mild soap and water after 48 hours. Pat the area dry with a clean towel. You do not need a bandage unless otherwise instructed. Do not apply any ointments or creams to your incision. You may have skin glue on your incision. Do not peel it off. It will come off on its own in about one week. Your arm may swell a bit after surgery. To reduce swelling use pillows to elevate your arm so it is above your heart. Your doctor will tell you if you need to lightly wrap your arm with an ACE bandage.  Diet  Resume your normal diet. There are not special food restrictions following this procedure. In order to heal from your surgery, it is CRITICAL to get adequate nutrition. Your body requires vitamins, minerals, and protein. Vegetables are the best source of vitamins and minerals. Vegetables also provide the perfect balance of protein. Processed food has little nutritional value, so try to avoid this.  Medications  Resume taking all of your medications. If your incision is causing pain, you may take over-the counter pain relievers such as acetaminophen (Tylenol). If you were prescribed a stronger pain medication, please be aware these medications can cause nausea and constipation. Prevent nausea by taking the medication with a snack or meal. Avoid constipation by drinking plenty of fluids and eating foods with high amount of fiber, such as fruits, vegetables, and grains.  Do not take Tylenol if you are taking  prescription pain medications.  Follow up Your surgeon may want to see you in the office following your access surgery. If so, this will be arranged at the time of your surgery.  Please call us immediately for any of the following conditions:  Increased pain, redness, drainage (pus) from your incision site Fever of 101 degrees or higher Severe or worsening pain at your incision site Hand pain or numbness.  Reduce your risk of vascular disease:  Stop smoking. If you would like help, call [facility] at 1-800-QUIT-NOW ((716)066-9650) or [place] at [phone]  Manage your cholesterol Maintain a desired weight Control your diabetes Keep your blood pressure down     11/08/2022 Timothy Holloway [id number] [birth year]  Surgeon(s): Maeola Harman, MD  Procedure(s): LIGATION of RIGHT RADIOCEPHALIC FISTULA  If you have any questions, please call the office at [phone].

## 2022-11-08 NOTE — Anesthesia Postprocedure Evaluation (Signed)
Anesthesia Post Note  Patient: Timothy Holloway  Procedure(s) Performed: LIGATION of RIGHT RADIOCEPHALIC FISTULA (Right: Arm Upper)     Patient location during evaluation: PACU Anesthesia Type: Regional and MAC Level of consciousness: awake Pain management: pain level controlled Vital Signs Assessment: post-procedure vital signs reviewed and stable Respiratory status: spontaneous breathing, nonlabored ventilation and respiratory function stable Cardiovascular status: stable and blood pressure returned to baseline Postop Assessment: no apparent nausea or vomiting Anesthetic complications: no   No notable events documented.  Last Vitals:  Vitals:   11/08/22 1245 11/08/22 1300  BP: 101/62 (!) 97/50  Pulse: (!) 58 (!) 56  Resp: 17 15  Temp:    SpO2: 92% 96%    Last Pain:  Vitals:   11/08/22 1235  TempSrc:   PainSc: 0-No pain                 Linton Rump

## 2022-11-08 NOTE — Transfer of Care (Signed)
Immediate Anesthesia Transfer of Care Note  Patient: Timothy Holloway  Procedure(s) Performed: LIGATION of RIGHT RADIOCEPHALIC FISTULA (Right: Arm Upper)  Patient Location: PACU  Anesthesia Type:MAC and Regional  Level of Consciousness: awake and alert   Airway & Oxygen Therapy: Patient Spontanous Breathing  Post-op Assessment: Report given to RN and Post -op Vital signs reviewed and stable  Post vital signs: Reviewed and stable  Last Vitals:  Vitals Value Taken Time  BP 104/50 11/08/22 1235  Temp    Pulse 59 11/08/22 1237  Resp 17 11/08/22 1237  SpO2 93 % 11/08/22 1237  Vitals shown include unfiled device data.  Last Pain:  Vitals:   11/08/22 0922  TempSrc:   PainSc: 0-No pain      Patients Stated Pain Goal: 0 (11/08/22 9147)  Complications: No notable events documented.

## 2022-11-08 NOTE — Op Note (Signed)
Patient name: Timothy Holloway MRN: [id number] DOB: 1955-07-11 Sex: male  11/08/2022 Pre-operative Diagnosis: ESRD, right arm steal syndrome with middle finger ischemic ulceration Post-operative diagnosis:  Same Surgeon:  Luanna Salk. Randie Heinz, MD Assistant: Doreatha Massed, PA Procedure Performed:  Ligation right radiocephalic AV fistula  Indications: 67 year old male with a history of a right radiocephalic fistula performed approximately 4 months ago for end-stage renal disease.  Currently he dialyzes via catheter.  He has steal in the right upper extremity has undergone fistulogram and plan is now for ligation.  He similarly had ligation of the left upper arm fistula for steal and has recovered well.  In experience assistant was necessary to facilitate exposure of the fistula and ligation.  Findings: Fistula itself was ligated proximally just past the anastomosis and then there was a strong radial signal distal to the fistula.   Procedure:  The patient was identified in the holding area and taken to the operating room where is placed supine on the operating table and MAC anesthesia was induced.  He was sterilely prepped and draped in the right upper extremity in the usual fashion, antibiotics were administered a timeout was called.  Ultrasound was used to identify the fistula this was marked on the skin back to the level of the previous incision.  That incision was then opened the fistula was identified and encircled.  I then clamped approximately checked with Doppler and there was very strong flow distally in the radial artery.  Fistula was then tied off distally and then transected.  I oversewed the proximal anastomosis with running 5-0 Prolene suture in a mattress fashion.  The wound was then thoroughly irrigated and hemostasis was obtained and the incision was then closed with a single layer of Monocryl at the skin level and Dermabond was placed above that.  The patient was then awakened from  anesthesia having tolerated the procedure without any complication.  All counts were correct at completion.  EBL: 10cc   Tiffanni Scarfo C. Randie Heinz, MD [facility] [place] Office: [phone] Pager: [phone]

## 2022-11-08 NOTE — Anesthesia Preprocedure Evaluation (Addendum)
Anesthesia Evaluation  Patient identified by MRN, date of birth, ID band Patient awake    Reviewed: Allergy & Precautions, H&P , NPO status , Patient's Chart, lab work & pertinent test results, reviewed documented beta blocker date and time   Airway Mallampati: IV  TM Distance: >3 FB Neck ROM: Full    Dental  (+) Teeth Intact, Dental Advisory Given   Pulmonary neg pulmonary ROS   Pulmonary exam normal breath sounds clear to auscultation       Cardiovascular hypertension (88/45 this AM, has been low since starting HD), Pt. on medications and Pt. on home beta blockers Normal cardiovascular exam Rhythm:Regular Rate:Normal     Neuro/Psych  PSYCHIATRIC DISORDERS  Depression    negative neurological ROS     GI/Hepatic negative GI ROS, Neg liver ROS,,,  Endo/Other  diabetes, Well Controlled, Type 2, Insulin Dependent  Morbid obesityBMI 39 FS 184 in preop, treated w/ insulin  Renal/GU ESRF and DialysisRenal diseaseHD MWF  negative genitourinary   Musculoskeletal negative musculoskeletal ROS (+)    Abdominal  (+) + obese  Peds negative pediatric ROS (+)  Hematology  (+) Blood dyscrasia, anemia Hb 11.2   Anesthesia Other Findings Trulicity LD: 9/22  Reproductive/Obstetrics negative OB ROS                             Anesthesia Physical Anesthesia Plan  ASA: 3  Anesthesia Plan: MAC and Regional   Post-op Pain Management: Regional block* and Tylenol PO (pre-op)*   Induction: Intravenous  PONV Risk Score and Plan: Ondansetron and Treatment may vary due to age or medical condition  Airway Management Planned: Natural Airway and Simple Face Mask  Additional Equipment: None  Intra-op Plan:   Post-operative Plan:   Informed Consent: I have reviewed the patients History and Physical, chart, labs and discussed the procedure including the risks, benefits and alternatives for the proposed  anesthesia with the patient or authorized representative who has indicated his/her understanding and acceptance.     Dental advisory given  Plan Discussed with: CRNA  Anesthesia Plan Comments:         Anesthesia Quick Evaluation

## 2022-11-08 NOTE — H&P (Signed)
HISTORY AND PHYSICAL        HPI: This is a 67 y.o. male here for evaluation of his hemodialysis access.     Pt has hx of left BC AVF 01/11/2022 by Dr. Karin Lieu.  Pt states they had started using this in April and it was working well, but he developed an ulceration on his fingertip on the left hand and the fistula was ligated May 2024 and his ulceration and pain improved.     He subsequently underwent creation of right RC AVF on 07/26/2022 by Dr. Arbie Cookey.  Pt was seen at the end of August and his fistula was slow to mature and he underwent a fistulogram, which revealed the fistula was patent without any evidence of central venous stenosis or anastomotic stenosis but u/s did reveal it was deep and had several branches.  He was scheduled to undergo superficialization and branch ligation next week.     He comes in today with complaints of a painful ulceration on the right 2nd finger.  He states it has been present for about 2 weeks and it is not improving and actually getting worse.  He states it is similar to his previous sx on the left.      The pt is right hand dominant.    Pt is on dialysis.   Days of dialysis if applicable:  M/W/F        The pt IS on a statin for cholesterol management.  The pt IS on a daily aspirin.  Other AC:  NONE The pt IS on ARB, hydralazine, diuretic, BB for hypertension.  The pt is  on medication for diabetes.   Tobacco hx:  never       Past Medical History:  Diagnosis Date   Cataract     Chronic kidney disease (CKD), stage III (moderate) (HCC)      dr Sharl Ma   Depression     Diabetes mellitus without complication (HCC)     ESRD on dialysis [facility])      M-W-F   Hyperlipidemia     Hypertension     Impotence     Insomnia     Obesity     Steal syndrome as complication of dialysis access [facility])                 Past Surgical History:  Procedure Laterality Date   A/V FISTULAGRAM Right 10/11/2022    Procedure: A/V Fistulagram;  Surgeon: Nada Libman,  MD;  Location: [facility] INVASIVE [facility];  Service: Cardiovascular;  Laterality: Right;   AV FISTULA PLACEMENT Left 01/11/2022    Procedure: LEFT ARM ARTERIOVENOUS (AV) FISTULA CREATION;  Surgeon: Victorino Sparrow, MD;  Location: [facility] OR;  Service: Vascular;  Laterality: Left;  PERIPHERAL NERVE BLOCK   AV FISTULA PLACEMENT Right 07/26/2022    Procedure: RIGHT UPPER EXTREMITY ARTERIOVENOUS (AV) FISTULA CREATION;  Surgeon: Larina Earthly, MD;  Location: [facility] ORS;  Service: Vascular;  Laterality: Right;   INSERTION OF DIALYSIS CATHETER Right 06/16/2022    Procedure: INSERTION OF RIGHT iNTERNAL Jugular TUNNELED DIALYSIS CATHETER;  Surgeon: Nada Libman, MD;  Location: [facility] OR;  Service: Vascular;  Laterality: Right;   KNEE SURGERY Left 01-31-2002   LIGATION OF ARTERIOVENOUS  FISTULA Left 06/16/2022    Procedure: LEFT ARM FISTULA LIGATION;  Surgeon: Nada Libman, MD;  Location: [facility] OR;  Service: Vascular;  Laterality: Left;   SPINE SURGERY        fusion of L4-L5  Allergies      Allergies  Allergen Reactions   Latex Hives   Lodine [Etodolac] Nausea And Vomiting              Current Outpatient Medications  Medication Sig Dispense Refill   HYDROcodone-acetaminophen (NORCO) 5-325 MG tablet Take 1 tablet by mouth every 6 (six) hours as needed for moderate pain. 8 tablet 0   acetaminophen (TYLENOL) 325 MG tablet Take 650 mg by mouth every 6 (six) hours as needed for moderate pain.       aspirin 81 MG tablet Take 81 mg by mouth daily.       atorvastatin (LIPITOR) 10 MG tablet Take 1 tablet (10 mg total) by mouth daily. (Patient taking differently: Take 10 mg by mouth at bedtime.) 30 tablet 11   betamethasone dipropionate 0.05 % cream Apply 1 Application topically daily as needed (on leg).       carvedilol (COREG) 3.125 MG tablet Take 1 tablet (3.125 mg total) by mouth 2 (two) times daily with a meal. (Patient taking differently: Take 6.25 mg by mouth 2 (two) times daily with a meal.) 60 tablet 0    Cholecalciferol (VITAMIN D) 2000 UNITS CAPS Take 2,000 Units by mouth at bedtime.       diclofenac Sodium (VOLTAREN) 1 % GEL Apply 2 g topically daily as needed (pain).       Dulaglutide (TRULICITY) 3 MG/0.5ML SOPN Inject 3 mg into the skin once a week. (Patient taking differently: Inject 3 mg into the skin once a week. Thursday) 3 mL 5   FARXIGA 10 MG TABS tablet Take 10 mg by mouth at bedtime.       fenofibrate (TRICOR) 145 MG tablet Take 1/2 tablet daily (Patient taking differently: Take 72.5 mg by mouth every morning.) 30 tablet 0   furosemide (LASIX) 80 MG tablet Take 80 mg by mouth daily.       Glucosamine-Chondroit-Vit C-Mn (GLUCOSAMINE CHONDR 1500 COMPLX) CAPS Take 1 capsule by mouth 2 (two) times daily.       HUMALOG KWIKPEN 200 UNIT/ML KwikPen Inject into the skin with breakfast, with lunch, and with evening meal. Sliding scale       hydrALAZINE (APRESOLINE) 50 MG tablet Take 1 tablet (50 mg total) by mouth every 8 (eight) hours. 90 tablet 0   hydrochlorothiazide (HYDRODIURIL) 25 MG tablet Take 25 mg by mouth daily.       isosorbide mononitrate (IMDUR) 30 MG 24 hr tablet Take 30 mg by mouth at bedtime.       LANTUS SOLOSTAR 100 UNIT/ML Solostar Pen Inject 50 Units into the skin at bedtime.       losartan (COZAAR) 100 MG tablet Take 100 mg by mouth at bedtime.       meclizine (ANTIVERT) 25 MG tablet Take 25 mg by mouth daily as needed for dizziness.       methocarbamol (ROBAXIN) 500 MG tablet Take 1 tablet by mouth daily as needed for muscle spasms.       Multiple Vitamin (MULTIVITAMIN) tablet Take 1 tablet by mouth daily.       sertraline (ZOLOFT) 50 MG tablet Take 50 mg by mouth daily as needed (mood).       sodium chloride (OCEAN) 0.65 % SOLN nasal spray Place 1 spray into both nostrils daily as needed for congestion.       zolpidem (AMBIEN) 10 MG tablet Take 1 tablet (10 mg total) by mouth at bedtime as needed for sleep. 30 tablet 2  Current Facility-Administered  Medications  Medication Dose Route Frequency Provider Last Rate Last Admin   0.9 %  sodium chloride infusion  250 mL Intravenous PRN Nada Libman, MD       0.9 %  sodium chloride infusion  250 mL Intravenous PRN Cephus Shelling, MD       sodium chloride flush (NS) 0.9 % injection 3 mL  3 mL Intravenous Q12H Nada Libman, MD       sodium chloride flush (NS) 0.9 % injection 3 mL  3 mL Intravenous PRN Nada Libman, MD       sodium chloride flush (NS) 0.9 % injection 3 mL  3 mL Intravenous Q12H Cephus Shelling, MD                 Family History  Problem Relation Age of Onset   Diabetes Mother     Cancer Father          lymphoma          Social History         Socioeconomic History   Marital status: Married      Spouse name: Not on file   Number of children: Not on file   Years of education: Not on file   Highest education level: Not on file  Occupational History   Not on file  Tobacco Use   Smoking status: Never      Passive exposure: Never   Smokeless tobacco: Not on file  Vaping Use   Vaping status: Never Used  Substance and Sexual Activity   Alcohol use: No   Drug use: No   Sexual activity: Yes      Comment: not much  Other Topics Concern   Not on file  Social History Narrative   Not on file    Social Determinants of Health        Financial Resource Strain: Low Risk  (04/22/2022)    Received from [facility], [facility]    Overall Financial Resource Strain (CARDIA)     Difficulty of Paying Living Expenses: Not hard at all  Food Insecurity: No Food Insecurity (04/22/2022)    Received from [facility], [facility]    Hunger Vital Sign     Worried About Running Out of Food in the Last Year: Never true     Ran Out of Food in the Last Year: Never true  Transportation Needs: No Transportation Needs (04/22/2022)    Received from [facility], [facility]    PRAPARE - Transportation     Lack of Transportation (Medical): No     Lack  of Transportation (Non-Medical): No  Physical Activity: Not on file  Stress: Not on file  Social Connections: Unknown (06/03/2021)    Received from [facility], [facility]    Social Network     Social Network: Not on file  Intimate Partner Violence: Unknown (05/04/2021)    Received from [facility], [facility]    HITS     Physically Hurt: Not on file     Insult or Talk Down To: Not on file     Threaten Physical Harm: Not on file     Scream or Curse: Not on file        ROS: [x]  Positive   [ ]  Negative   [ ]  All sytems reviewed and are negative   Cardiac: [x]  HTN   Vascular: [x]  pain in finger right hand   Pulmonary: []   asthma []  wheezing   Neurologic: []  hx CVA/TIA   Hematologic: [x]  anemia of chronic disease   GI []  GERD   GU: [x]  CKD/renal failure  [x]  HD   Psychiatric: []  hx of major depression   Integumentary: []  rashes []  ulcers   Constitutional: []  fever []  chills     PHYSICAL EXAMINATION:   Vitals:   11/08/22 0839 11/08/22 0922  BP: (!) 95/47 (!) 88/45  Resp: 18   Temp: 97.8 F (36.6 C)   SpO2: 97%        General:  WDWN male in NAD Gait: Not observed HENT: WNL Pulmonary: normal non-labored breathing  Cardiac: regular Abdomen: soft, NT Skin: without rashes Vascular Exam/Pulses: excellent doppler signal right radial and palmar arch Extremities:  + thrill in right RC AVF; ulceration right 2nd finger   Musculoskeletal: no muscle wasting or atrophy       Neurologic: A&O X 3   Non-Invasive Vascular Imaging:   Steal study on 11/01/2022: +---------------------------+-------+----+--------+                            Right  LeftComments  +---------------------------+-------+----+--------+  Brachial                                       +---------------------------+-------+----+--------+  Radial Ambient                                  +---------------------------+-------+----+--------+  Radial AV Compression                            +---------------------------+-------+----+--------+  Ulnar Ambient                                   +---------------------------+-------+----+--------+  Ulnar AV Compression                            +---------------------------+-------+----+--------+  2nd Digit Ambient          9 mmHg               +---------------------------+-------+----+--------+  2nd Digit Ulnar Compression16 mmHg              +---------------------------+-------+----+--------+   Summary:  Increased velocity with compression of second digit.        ASSESSMENT/PLAN: 67 y.o. male with ESRD here for evaluation for steal syndrome right hand with ulceration right 2nd finger with hx of steal left hand with ligation of left arm fistula. Plan for ligation right arm avf today. Will likely be catheter dependent moving forward. Tdc working well.    Arvie Bartholomew C. Randie Heinz, MD [facility] Office: [phone] Pager: [phone]

## 2022-11-09 ENCOUNTER — Encounter (HOSPITAL_COMMUNITY): Payer: Self-pay | Admitting: Vascular Surgery

## 2022-11-11 ENCOUNTER — Emergency Department (HOSPITAL_COMMUNITY): Payer: BC Managed Care – PPO

## 2022-11-11 ENCOUNTER — Emergency Department (HOSPITAL_COMMUNITY)
Admission: EM | Admit: 2022-11-11 | Discharge: 2022-11-11 | Disposition: A | Discharge status: Home / Self Care | Payer: BC Managed Care – PPO | Attending: Emergency Medicine | Admitting: Emergency Medicine

## 2022-11-11 ENCOUNTER — Other Ambulatory Visit: Payer: Self-pay

## 2022-11-11 DIAGNOSIS — D631 Anemia in chronic kidney disease: Secondary | ICD-10-CM | POA: Diagnosis not present

## 2022-11-11 DIAGNOSIS — Z7982 Long term (current) use of aspirin: Secondary | ICD-10-CM | POA: Insufficient documentation

## 2022-11-11 DIAGNOSIS — Z452 Encounter for adjustment and management of vascular access device: Secondary | ICD-10-CM | POA: Insufficient documentation

## 2022-11-11 DIAGNOSIS — Z5189 Encounter for other specified aftercare: Secondary | ICD-10-CM

## 2022-11-11 DIAGNOSIS — Z91040 Latex allergy status: Secondary | ICD-10-CM | POA: Diagnosis not present

## 2022-11-11 DIAGNOSIS — Z794 Long term (current) use of insulin: Secondary | ICD-10-CM | POA: Insufficient documentation

## 2022-11-11 DIAGNOSIS — Z992 Dependence on renal dialysis: Secondary | ICD-10-CM | POA: Insufficient documentation

## 2022-11-11 DIAGNOSIS — N186 End stage renal disease: Secondary | ICD-10-CM | POA: Insufficient documentation

## 2022-11-11 DIAGNOSIS — E11319 Type 2 diabetes mellitus with unspecified diabetic retinopathy without macular edema: Secondary | ICD-10-CM | POA: Insufficient documentation

## 2022-11-11 DIAGNOSIS — I120 Hypertensive chronic kidney disease with stage 5 chronic kidney disease or end stage renal disease: Secondary | ICD-10-CM | POA: Diagnosis not present

## 2022-11-11 DIAGNOSIS — Z7984 Long term (current) use of oral hypoglycemic drugs: Secondary | ICD-10-CM | POA: Insufficient documentation

## 2022-11-11 DIAGNOSIS — Z79899 Other long term (current) drug therapy: Secondary | ICD-10-CM | POA: Diagnosis not present

## 2022-11-11 LAB — CBC WITH DIFFERENTIAL/PLATELET
Abs Immature Granulocytes: 0.18 10*3/uL — ABNORMAL HIGH (ref 0.00–0.07)
Basophils Absolute: 0.1 10*3/uL (ref 0.0–0.1)
Basophils Relative: 1 %
Eosinophils Absolute: 0.4 10*3/uL (ref 0.0–0.5)
Eosinophils Relative: 4 %
HCT: 31.1 % — ABNORMAL LOW (ref 39.0–52.0)
Hemoglobin: 9.4 g/dL — ABNORMAL LOW (ref 13.0–17.0)
Immature Granulocytes: 2 %
Lymphocytes Relative: 12 %
Lymphs Abs: 1 10*3/uL (ref 0.7–4.0)
MCH: 28.5 pg (ref 26.0–34.0)
MCHC: 30.2 g/dL (ref 30.0–36.0)
MCV: 94.2 fL (ref 80.0–100.0)
Monocytes Absolute: 0.7 10*3/uL (ref 0.1–1.0)
Monocytes Relative: 8 %
Neutro Abs: 6.5 10*3/uL (ref 1.7–7.7)
Neutrophils Relative %: 73 %
Platelets: 290 10*3/uL (ref 150–400)
RBC: 3.3 MIL/uL — ABNORMAL LOW (ref 4.22–5.81)
RDW: 15.8 % — ABNORMAL HIGH (ref 11.5–15.5)
WBC: 8.8 10*3/uL (ref 4.0–10.5)
nRBC: 0 % (ref 0.0–0.2)

## 2022-11-11 LAB — COMPREHENSIVE METABOLIC PANEL
ALT: 8 U/L (ref 0–44)
AST: 15 U/L (ref 15–41)
Albumin: 3 g/dL — ABNORMAL LOW (ref 3.5–5.0)
Alkaline Phosphatase: 59 U/L (ref 38–126)
Anion gap: 13 (ref 5–15)
BUN: 62 mg/dL — ABNORMAL HIGH (ref 8–23)
CO2: 23 mmol/L (ref 22–32)
Calcium: 8.6 mg/dL — ABNORMAL LOW (ref 8.9–10.3)
Chloride: 99 mmol/L (ref 98–111)
Creatinine, Ser: 5.42 mg/dL — ABNORMAL HIGH (ref 0.61–1.24)
GFR, Estimated: 11 mL/min — ABNORMAL LOW (ref >60)
Glucose, Bld: 313 mg/dL — ABNORMAL HIGH (ref 70–99)
Potassium: 3.9 mmol/L (ref 3.5–5.1)
Sodium: 135 mmol/L (ref 135–145)
Total Bilirubin: 0.5 mg/dL (ref 0.3–1.2)
Total Protein: 6.5 g/dL (ref 6.5–8.1)

## 2022-11-11 LAB — PROTIME-INR
INR: 1.1 (ref 0.8–1.2)
Prothrombin Time: 14.5 s (ref 11.4–15.2)

## 2022-11-11 LAB — LACTIC ACID, PLASMA
Lactic Acid, Venous: 1.2 mmol/L (ref 0.5–1.9)
Lactic Acid, Venous: 1.1 mmol/L (ref 0.5–1.9)

## 2022-11-11 LAB — APTT: aPTT: 26 s (ref 24–36)

## 2022-11-11 NOTE — Discharge Instructions (Signed)
It was a pleasure caring for you today in the emergency department.  Please call your dialysis clinic to see if they can reschedule your dialysis session.   Your catheter looks okay today, does not appear to have an infection   Please return to the emergency department for any worsening or worrisome symptoms.

## 2022-11-11 NOTE — ED Provider Notes (Signed)
[place] EMERGENCY DEPARTMENT AT [facility] Provider Note  CSN: [id number] Arrival date & time: 11/11/22 1044  Chief Complaint(s) Vascular Access Problem  HPI Timothy Holloway is a 67 y.o. male with past medical history as below, significant for CKD stage III, DM, ESRD Monday Wednesday Friday, HLD, HTN who presents to the ED with complaint of possible infection to HD cath.   Last dialysis was Wednesday, he went to dialysis today, at facility they were concerned that there was some purulent drainage from his dialysis catheter and some redness and warmth.  He has had this catheter for about 4 months now.  Patient reports it was the same technician that performed his dialysis on Wednesday reports that the catheter site appeared abnormal while at last HD looked normal.  Patient does not typically look at his catheter site and is unsure what it looks like at baseline.  He denies any fevers, chills, nausea or vomiting.  No significant chest pain or dyspnea.  Otherwise feels well  Past Medical History Past Medical History:  Diagnosis Date   Anemia    Cataract    Chronic kidney disease (CKD), stage III (moderate) (HCC)    dr Sharl Ma   Depression    Diabetes mellitus without complication (HCC)    DVT (deep venous thrombosis) (HCC)    as a child   ESRD on dialysis [facility])    M-W-F   Hyperlipidemia    Hypertension    Impotence    Insomnia    Obesity    Steal syndrome as complication of dialysis access [facility])    Patient Active Problem List   Diagnosis Date Noted   Acute renal failure superimposed on stage 4 chronic kidney disease (HCC) 07/21/2021   Anemia due to chronic kidney disease 07/21/2021   Sinusitis 07/21/2021   AKI (acute kidney injury) (HCC) 09/11/2020   Morbid obesity (HCC) 08/06/2012   HLD (hyperlipidemia) 08/06/2012   HTN (hypertension) 08/06/2012   Diabetic retinopathy (HCC) 08/06/2012   Erectile dysfunction 08/06/2012   Diabetes mellitus (HCC) 08/06/2012    Insomnia 08/06/2012   Muscle spasm of back 08/06/2012   Chronic back pain 08/06/2012   CKD (chronic kidney disease) 08/06/2012   Home Medication(s) Prior to Admission medications   Medication Sig Start Date End Date Taking? Authorizing Provider  acetaminophen (TYLENOL) 325 MG tablet Take 650 mg by mouth every 6 (six) hours as needed for moderate pain. 06/06/20   [provider]  aspirin 81 MG tablet Take 81 mg by mouth daily.    [provider]  atorvastatin (LIPITOR) 10 MG tablet Take 1 tablet (10 mg total) by mouth daily. Patient taking differently: Take 10 mg by mouth at bedtime. 08/06/12   Ileana Ladd, MD  betamethasone dipropionate 0.05 % cream Apply 1 Application topically daily as needed (on leg). 04/28/21   [provider]  carvedilol (COREG) 3.125 MG tablet Take 1 tablet (3.125 mg total) by mouth 2 (two) times daily with a meal. Patient taking differently: Take 6.25 mg by mouth 2 (two) times daily with a meal. 09/13/20   Glade Lloyd, MD  Cholecalciferol (VITAMIN D) 2000 UNITS CAPS Take 2,000 Units by mouth at bedtime.    [provider]  diclofenac Sodium (VOLTAREN) 1 % GEL Apply 2 g topically daily as needed (pain).    [provider]  Dulaglutide (TRULICITY) 3 MG/0.5ML SOPN Inject 3 mg into the skin once a week. Patient taking differently: Inject 3 mg into the skin once  a week. Sunday 06/03/22     FARXIGA 10 MG TABS tablet Take 10 mg by mouth at bedtime.    [provider]  fenofibrate (TRICOR) 145 MG tablet Take 1/2 tablet daily Patient taking differently: Take 72.5 mg by mouth every morning. 09/06/13   Ernestina Penna, MD  furosemide (LASIX) 80 MG tablet Take 80 mg by mouth daily. 12/28/21   [provider]  Glucosamine-Chondroit-Vit C-Mn (GLUCOSAMINE CHONDR 1500 COMPLX) CAPS Take 1 capsule by mouth 2 (two) times daily.    [provider]  HUMALOG KWIKPEN 200 UNIT/ML KwikPen Inject into the skin with breakfast,  with lunch, and with evening meal. Sliding scale 07/19/21   [provider]  hydrALAZINE (APRESOLINE) 50 MG tablet Take 1 tablet (50 mg total) by mouth every 8 (eight) hours. 07/23/21   Leroy Sea, MD  hydrochlorothiazide (HYDRODIURIL) 25 MG tablet Take 25 mg by mouth daily. 12/29/21   [provider]  HYDROcodone-acetaminophen (NORCO) 5-325 MG tablet Take 1 tablet by mouth every 6 (six) hours as needed for moderate pain. 11/08/22   Rhyne, Ames Coupe, PA-C  isosorbide mononitrate (IMDUR) 30 MG 24 hr tablet Take 30 mg by mouth at bedtime.    [provider]  LANTUS SOLOSTAR 100 UNIT/ML Solostar Pen Inject 50 Units into the skin at bedtime. 10/25/21   [provider]  losartan (COZAAR) 100 MG tablet Take 100 mg by mouth at bedtime. 10/25/21   [provider]  meclizine (ANTIVERT) 25 MG tablet Take 25 mg by mouth daily as needed for dizziness. 04/24/15   [provider]  methocarbamol (ROBAXIN) 500 MG tablet Take 1 tablet by mouth daily as needed for muscle spasms. 10/14/19   [provider]  Multiple Vitamin (MULTIVITAMIN) tablet Take 1 tablet by mouth daily.    [provider]  sertraline (ZOLOFT) 50 MG tablet Take 50 mg by mouth daily as needed (mood). 12/30/21   [provider]  sodium chloride (OCEAN) 0.65 % SOLN nasal spray Place 1 spray into both nostrils daily as needed for congestion.    [provider]  zolpidem (AMBIEN) 10 MG tablet Take 1 tablet (10 mg total) by mouth at bedtime as needed for sleep. 01/11/13   Ileana Ladd, MD                                                                                                                                    Past Surgical History Past Surgical History:  Procedure Laterality Date   A/V FISTULAGRAM Right 10/11/2022   Procedure: A/V Fistulagram;  Surgeon: Nada Libman, MD;  Location: [facility] INVASIVE CV LAB;  Service: Cardiovascular;  Laterality: Right;    AV FISTULA PLACEMENT Left 01/11/2022   Procedure: LEFT ARM ARTERIOVENOUS (AV) FISTULA CREATION;  Surgeon: Victorino Sparrow, MD;  Location: [facility] OR;  Service: Vascular;  Laterality: Left;  PERIPHERAL NERVE BLOCK   AV FISTULA  PLACEMENT Right 07/26/2022   Procedure: RIGHT UPPER EXTREMITY ARTERIOVENOUS (AV) FISTULA CREATION;  Surgeon: Larina Earthly, MD;  Location: [facility] ORS;  Service: Vascular;  Laterality: Right;   INSERTION OF DIALYSIS CATHETER Right 06/16/2022   Procedure: INSERTION OF RIGHT iNTERNAL Jugular TUNNELED DIALYSIS CATHETER;  Surgeon: Nada Libman, MD;  Location: [facility] OR;  Service: Vascular;  Laterality: Right;   KNEE SURGERY Left 01-31-2002   LIGATION OF ARTERIOVENOUS  FISTULA Left 06/16/2022   Procedure: LEFT ARM FISTULA LIGATION;  Surgeon: Nada Libman, MD;  Location: [facility] OR;  Service: Vascular;  Laterality: Left;   LIGATION OF ARTERIOVENOUS  FISTULA Right 11/08/2022   Procedure: LIGATION of RIGHT RADIOCEPHALIC FISTULA;  Surgeon: Maeola Harman, MD;  [facility] OR;  Service: Vascular;  Laterality: Right;   SPINE SURGERY     fusion of L4-L5   UPPER EXTREMITY ANGIOGRAPHY Right 11/03/2022   Procedure: Upper Extremity Angiography;  Surgeon: Cephus Shelling, MD;  Location: [facility] INVASIVE CV LAB;  Service: Cardiovascular;  Laterality: Right;   Family History Family History  Problem Relation Age of Onset   Diabetes Mother    Cancer Father        lymphoma    Social History Social History   Tobacco Use   Smoking status: Never    Passive exposure: Never  Vaping Use   Vaping status: Never Used  Substance Use Topics   Alcohol use: No   Drug use: No   Allergies Latex and Lodine [etodolac]  Review of Systems Review of Systems  Constitutional:  Negative for chills and fever.  Respiratory:  Negative for chest tightness and shortness of breath.   Cardiovascular:  Negative for chest pain.  Gastrointestinal:  Negative for abdominal pain.  Skin:  Positive for color  change.  All other systems reviewed and are negative.   Physical Exam Vital Signs  I have reviewed the triage vital signs BP (!) 141/43   Pulse 72   Temp 98.2 F (36.8 C) (Oral)   Resp 14   Ht 6' (1.829 m)   Wt 130.6 kg   SpO2 96%   BMI 39.06 kg/m  Physical Exam Vitals and nursing note reviewed.  Constitutional:      General: He is not in acute distress.    Appearance: He is well-developed.  HENT:     Head: Normocephalic and atraumatic.     Right Ear: External ear normal.     Left Ear: External ear normal.     Mouth/Throat:     Mouth: Mucous membranes are moist.  Eyes:     General: No scleral icterus. Cardiovascular:     Rate and Rhythm: Normal rate and regular rhythm.     Pulses: Normal pulses.     Heart sounds: Normal heart sounds.  Pulmonary:     Effort: Pulmonary effort is normal. No respiratory distress.     Breath sounds: Normal breath sounds.  Chest:    Abdominal:     General: Abdomen is flat.     Palpations: Abdomen is soft.     Tenderness: There is no abdominal tenderness.  Musculoskeletal:     Cervical back: No rigidity.     Right lower leg: No edema.     Left lower leg: No edema.  Skin:    General: Skin is warm and dry.     Capillary Refill: Capillary refill takes less than 2 seconds.  Neurological:     Mental Status: He is alert.  Psychiatric:  Mood and Affect: Mood normal.        Behavior: Behavior normal.     ED Results and Treatments Labs (all labs ordered are listed, but only abnormal results are displayed) Labs Reviewed  COMPREHENSIVE METABOLIC PANEL - Abnormal; Notable for the following components:      Result Value   Glucose, Bld 313 (*)    BUN 62 (*)    Creatinine, Ser 5.42 (*)    Calcium 8.6 (*)    Albumin 3.0 (*)    GFR, Estimated 11 (*)    All other components within normal limits  CBC WITH DIFFERENTIAL/PLATELET - Abnormal; Notable for the following components:   RBC 3.30 (*)    Hemoglobin 9.4 (*)    HCT 31.1 (*)     RDW 15.8 (*)    Abs Immature Granulocytes 0.18 (*)    All other components within normal limits  CULTURE, BLOOD (ROUTINE X 2)  CULTURE, BLOOD (ROUTINE X 2)  LACTIC ACID, PLASMA  LACTIC ACID, PLASMA  PROTIME-INR  APTT                                                                                                                          Radiology DG Chest Port 1 View  Result Date: 11/11/2022 CLINICAL DATA:  Questionable sepsis.  Dialysis patient EXAM: PORTABLE CHEST 1 VIEW COMPARISON:  X-ray 06/16/2022 FINDINGS: Enlarged cardiopericardial silhouette with a calcified aorta. Vascular congestion. No pneumothorax, effusion or edema. Overlapping cardiac leads. Calcified aorta. There is double-lumen right IJ catheter with the tip along the right atrium. IMPRESSION: Enlarged heart with vascular congestion.  Right IJ catheter. Electronically Signed   By: Karen Kays M.D.   On: 11/11/2022 13:29    Pertinent labs & imaging results that were available during my care of the patient were reviewed by me and considered in my medical decision making (see MDM for details).  Medications Ordered in ED Medications - No data to display                                                                                                                                   Procedures Procedures  (including critical care time)  Medical Decision Making / ED Course    Medical Decision Making:    Timothy Holloway is a 67 y.o. male with past medical history as below, significant for CKD stage III, DM, ESRD  Monday Wednesday Friday, HLD, HTN who presents to the ED with complaint of possible infection to HD cath. . The complaint involves an extensive differential diagnosis and also carries with it a high risk of complications and morbidity.  Serious etiology was considered. Ddx includes but is not limited to: Wound infection, irritation from catheter, superficial irritation, cellulitis, etc.  Complete initial  physical exam performed, notably the patient  was distress, afebrile, no hypoxia..    Reviewed and confirmed nursing documentation for past medical history, family history, social history.  Vital signs reviewed.    Clinical Course as of 11/11/22 1411  Fri Nov 11, 2022  1331 Spoke with Dr Signe Colt, catheter looks okay, does not appear acutely infected. See photo on media tab [SG]    Clinical Course User Index [SG] Sloan Leiter, DO    Labs reviewed, these are stable.  He is HDS.  Afebrile.  Does catheter does not appear to be acutely infected.  Appears to be some local irritation from the tape holding the catheter bandage in place.  There is a small scab attached to one of the sutures.  No purulent drainage.  No pain on palpation, no streaking.  No sig evidence of infection. Okay to keep using HD catheter.           The patient improved significantly and was discharged in stable condition. Detailed discussions were had with the patient regarding current findings, and need for close f/u with PCP or on call doctor. The patient has been instructed to return immediately if the symptoms worsen in any way for re-evaluation. Patient verbalized understanding and is in agreement with current care plan. All questions answered prior to discharge.      Additional history obtained: -Additional history obtained from na -External records from outside source obtained and reviewed including: Chart review including previous notes, labs, imaging, consultation notes including  Prior ED visits, primary care documentation   Lab Tests: -I ordered, reviewed, and interpreted labs.   The pertinent results include:   Labs Reviewed  COMPREHENSIVE METABOLIC PANEL - Abnormal; Notable for the following components:      Result Value   Glucose, Bld 313 (*)    BUN 62 (*)    Creatinine, Ser 5.42 (*)    Calcium 8.6 (*)    Albumin 3.0 (*)    GFR, Estimated 11 (*)    All other components within normal limits   CBC WITH DIFFERENTIAL/PLATELET - Abnormal; Notable for the following components:   RBC 3.30 (*)    Hemoglobin 9.4 (*)    HCT 31.1 (*)    RDW 15.8 (*)    Abs Immature Granulocytes 0.18 (*)    All other components within normal limits  CULTURE, BLOOD (ROUTINE X 2)  CULTURE, BLOOD (ROUTINE X 2)  LACTIC ACID, PLASMA  LACTIC ACID, PLASMA  PROTIME-INR  APTT    Notable for stable labs  EKG   EKG Interpretation Date/Time:    Ventricular Rate:    PR Interval:    QRS Duration:    QT Interval:    QTC Calculation:   R Axis:      Text Interpretation:           Imaging Studies ordered: I ordered imaging studies including cxr I independently visualized the following imaging with scope of interpretation limited to determining acute life threatening conditions related to emergency care; findings noted above I independently visualized and interpreted imaging. I agree with the radiologist interpretation   Medicines ordered  and prescription drug management: No orders of the defined types were placed in this encounter.   -I have reviewed the patients home medicines and have made adjustments as needed   Consultations Obtained: I requested consultation with nephro,  and discussed lab and imaging findings as well as pertinent plan - they recommend: as above   Cardiac Monitoring: Continuous pulse oximetry interpreted by myself, 99% on RA.    Social Determinants of Health:  Diagnosis or treatment significantly limited by social determinants of health: obesity, ESRD   Reevaluation: After the interventions noted above, I reevaluated the patient and found that they have stayed the same  Co morbidities that complicate the patient evaluation  Past Medical History:  Diagnosis Date   Anemia    Cataract    Chronic kidney disease (CKD), stage III (moderate) (HCC)    dr Sharl Ma   Depression    Diabetes mellitus without complication (HCC)    DVT (deep venous thrombosis) (HCC)    as  a child   ESRD on dialysis (HCC)    M-W-F   Hyperlipidemia    Hypertension    Impotence    Insomnia    Obesity    Steal syndrome as complication of dialysis access [facility])       Dispostion: Disposition decision including need for hospitalization was considered, and patient discharged from emergency department.    Final Clinical Impression(s) / ED Diagnoses Final diagnoses:  None        Sloan Leiter, DO 11/11/22 1412

## 2022-11-16 LAB — CULTURE, BLOOD (ROUTINE X 2)
Culture: NO GROWTH
Culture: NO GROWTH

## 2022-12-06 NOTE — Progress Notes (Deleted)
Office Note     CC:  ESRD Requesting Provider:  [facility]  HPI: Timothy Holloway is a Right handed 67 y.o. (03/16/1955) male with kidney disease well-known to our practice and previously undergone left brachiocephalic fistula creation, ligated for steal syndrome, right radiocephalic fistula creation subsequently ligated for steal syndrome as well.     On exam, Timothy Holloway was doing well ***.  Originally from [place], he now lives in [place].  He continues to work full-time at [employer] working 12-hour shifts.  Creatinine has continued to decline.  Currently CKD 4.  Timothy Holloway denies previous upper extremity surgeries.   The pt is  on a statin for cholesterol management.  The pt is  on a daily aspirin.   Other AC:  - The pt is on medications for hypertension.   The pt is  diabetic. Tobacco hx:  -  Past Medical History:  Diagnosis Date   Anemia    Cataract    Chronic kidney disease (CKD), stage III (moderate) (HCC)    dr Sharl Ma   Depression    Diabetes mellitus without complication (HCC)    DVT (deep venous thrombosis) (HCC)    as a child   ESRD on dialysis [facility])    M-W-F   Hyperlipidemia    Hypertension    Impotence    Insomnia    Obesity    Steal syndrome as complication of dialysis access [facility])     Past Surgical History:  Procedure Laterality Date   A/V FISTULAGRAM Right 10/11/2022   Procedure: A/V Fistulagram;  Surgeon: Nada Libman, MD;  Location: [facility] INVASIVE CV LAB;  Service: Cardiovascular;  Laterality: Right;   AV FISTULA PLACEMENT Left 01/11/2022   Procedure: LEFT ARM ARTERIOVENOUS (AV) FISTULA CREATION;  Surgeon: Victorino Sparrow, MD;  Location: [facility] OR;  Service: Vascular;  Laterality: Left;  PERIPHERAL NERVE BLOCK   AV FISTULA PLACEMENT Right 07/26/2022   Procedure: RIGHT UPPER EXTREMITY ARTERIOVENOUS (AV) FISTULA CREATION;  Surgeon: Larina Earthly, MD;  Location: [facility] ORS;  Service: Vascular;  Laterality: Right;   INSERTION OF DIALYSIS CATHETER Right  06/16/2022   Procedure: INSERTION OF RIGHT iNTERNAL Jugular TUNNELED DIALYSIS CATHETER;  Surgeon: Nada Libman, MD;  Location: [facility] OR;  Service: Vascular;  Laterality: Right;   KNEE SURGERY Left 01-31-2002   LIGATION OF ARTERIOVENOUS  FISTULA Left 06/16/2022   Procedure: LEFT ARM FISTULA LIGATION;  Surgeon: Nada Libman, MD;  Location: [facility] OR;  Service: Vascular;  Laterality: Left;   LIGATION OF ARTERIOVENOUS  FISTULA Right 11/08/2022   Procedure: LIGATION of RIGHT RADIOCEPHALIC FISTULA;  Surgeon: Maeola Harman, MD;  Location: [facility] OR;  Service: Vascular;  Laterality: Right;   SPINE SURGERY     fusion of L4-L5   UPPER EXTREMITY ANGIOGRAPHY Right 11/03/2022   Procedure: Upper Extremity Angiography;  Surgeon: Cephus Shelling, MD;  Location: [facility] INVASIVE CV LAB;  Service: Cardiovascular;  Laterality: Right;    Social History   Socioeconomic History   Marital status: Married    Spouse name: Not on file   Number of children: Not on file   Years of education: Not on file   Highest education level: Not on file  Occupational History   Not on file  Tobacco Use   Smoking status: Never    Passive exposure: Never   Smokeless tobacco: Not on file  Vaping Use   Vaping status: Never Used  Substance and Sexual Activity   Alcohol use: No   Drug  use: No   Sexual activity: Yes    Comment: not much  Other Topics Concern   Not on file  Social History Narrative   Not on file   Social Determinants of Health   Financial Resource Strain: Low Risk  (04/22/2022)   Received from [facility], [facility]   Overall Financial Resource Strain (CARDIA)    Difficulty of Paying Living Expenses: Not hard at all  Food Insecurity: No Food Insecurity (04/22/2022)   [facility], [facility]   Hunger Vital Sign    Worried About Running Out of Food in the Last Year: Never true    Ran Out of Food in the Last Year: Never true  Transportation Needs: No Transportation Needs  (04/22/2022)   Received from [facility], [facility]   PRAPARE - Transportation    Lack of Transportation (Medical): No    Lack of Transportation (Non-Medical): No  Physical Activity: Not on file  Stress: Not on file  Social Connections: Unknown (06/03/2021)   Received from [facility], [facility]   Social Network    Social Network: Not on file  Intimate Partner Violence: Unknown (05/04/2021)   Received from [facility], [facility]   HITS    Physically Hurt: Not on file    Insult or Talk Down To: Not on file    Threaten Physical Harm: Not on file    Scream or Curse: Not on file   Family History  Problem Relation Age of Onset   Diabetes Mother    Cancer Father        lymphoma    Current Outpatient Medications  Medication Sig Dispense Refill   acetaminophen (TYLENOL) 325 MG tablet Take 650 mg by mouth every 6 (six) hours as needed for moderate pain.     aspirin 81 MG tablet Take 81 mg by mouth daily.     atorvastatin (LIPITOR) 10 MG tablet Take 1 tablet (10 mg total) by mouth daily. (Patient taking differently: Take 10 mg by mouth at bedtime.) 30 tablet 11   betamethasone dipropionate 0.05 % cream Apply 1 Application topically daily as needed (on leg).     carvedilol (COREG) 3.125 MG tablet Take 1 tablet (3.125 mg total) by mouth 2 (two) times daily with a meal. (Patient taking differently: Take 6.25 mg by mouth 2 (two) times daily with a meal.) 60 tablet 0   Cholecalciferol (VITAMIN D) 2000 UNITS CAPS Take 2,000 Units by mouth at bedtime.     diclofenac Sodium (VOLTAREN) 1 % GEL Apply 2 g topically daily as needed (pain).     Dulaglutide (TRULICITY) 3 MG/0.5ML SOPN Inject 3 mg into the skin once a week. (Patient taking differently: Inject 3 mg into the skin once a week. Sunday) 3 mL 5   FARXIGA 10 MG TABS tablet Take 10 mg by mouth at bedtime.     fenofibrate (TRICOR) 145 MG tablet Take 1/2 tablet daily (Patient taking differently: Take 72.5 mg by mouth every morning.)  30 tablet 0   furosemide (LASIX) 80 MG tablet Take 80 mg by mouth daily.     Glucosamine-Chondroit-Vit C-Mn (GLUCOSAMINE CHONDR 1500 COMPLX) CAPS Take 1 capsule by mouth 2 (two) times daily.     HUMALOG KWIKPEN 200 UNIT/ML KwikPen Inject into the skin with breakfast, with lunch, and with evening meal. Sliding scale     hydrALAZINE (APRESOLINE) 50 MG tablet Take 1 tablet (50 mg total) by mouth every 8 (eight) hours. 90 tablet 0  hydrochlorothiazide (HYDRODIURIL) 25 MG tablet Take 25 mg by mouth daily.     HYDROcodone-acetaminophen (NORCO) 5-325 MG tablet Take 1 tablet by mouth every 6 (six) hours as needed for moderate pain. 8 tablet 0   isosorbide mononitrate (IMDUR) 30 MG 24 hr tablet Take 30 mg by mouth at bedtime.     LANTUS SOLOSTAR 100 UNIT/ML Solostar Pen Inject 50 Units into the skin at bedtime.     losartan (COZAAR) 100 MG tablet Take 100 mg by mouth at bedtime.     meclizine (ANTIVERT) 25 MG tablet Take 25 mg by mouth daily as needed for dizziness.     methocarbamol (ROBAXIN) 500 MG tablet Take 1 tablet by mouth daily as needed for muscle spasms.     Multiple Vitamin (MULTIVITAMIN) tablet Take 1 tablet by mouth daily.     sertraline (ZOLOFT) 50 MG tablet Take 50 mg by mouth daily as needed (mood).     sodium chloride (OCEAN) 0.65 % SOLN nasal spray Place 1 spray into both nostrils daily as needed for congestion.     zolpidem (AMBIEN) 10 MG tablet Take 1 tablet (10 mg total) by mouth at bedtime as needed for sleep. 30 tablet 2   Current Facility-Administered Medications  Medication Dose Route Frequency Provider Last Rate Last Admin   0.9 %  sodium chloride infusion  250 mL Intravenous PRN Nada Libman, MD       0.9 %  sodium chloride infusion  250 mL Intravenous PRN Cephus Shelling, MD       sodium chloride flush (NS) 0.9 % injection 3 mL  3 mL Intravenous Q12H Nada Libman, MD       sodium chloride flush (NS) 0.9 % injection 3 mL  3 mL Intravenous PRN Nada Libman, MD        sodium chloride flush (NS) 0.9 % injection 3 mL  3 mL Intravenous Q12H Cephus Shelling, MD        Allergies  Allergen Reactions   Latex Hives   Lodine [Etodolac] Nausea And Vomiting     REVIEW OF SYSTEMS:   [X]  denotes positive finding, [ ]  denotes negative finding Cardiac  Comments:  Chest pain or chest pressure:    Shortness of breath upon exertion:    Short of breath when lying flat:    Irregular heart rhythm:        Vascular    Pain in calf, thigh, or hip brought on by ambulation:    Pain in feet at night that wakes you up from your sleep:     Blood clot in your veins:    Leg swelling:         Pulmonary    Oxygen at home:    Productive cough:     Wheezing:         Neurologic    Sudden weakness in arms or legs:     Sudden numbness in arms or legs:     Sudden onset of difficulty speaking or slurred speech:    Temporary loss of vision in one eye:     Problems with dizziness:         Gastrointestinal    Blood in stool:     Vomited blood:         Genitourinary    Burning when urinating:     Blood in urine:        Psychiatric    Major depression:  Hematologic    Bleeding problems:    Problems with blood clotting too easily:        Skin    Rashes or ulcers:        Constitutional    Fever or chills:      PHYSICAL EXAMINATION:  There were no vitals filed for this visit.   General:  WDWN in NAD; vital signs documented above Gait: Not observed HENT: WNL, normocephalic Pulmonary: normal non-labored breathing , without Rales, rhonchi,  wheezing Cardiac: regular HR Abdomen: soft, NT, no masses Skin: without rashes Vascular Exam/Pulses:  Right Left  Radial 2+ (normal) 2+ (normal)                       Extremities: without ischemic changes, without Gangrene , without cellulitis; without open wounds;  Musculoskeletal: no muscle wasting or atrophy  Neurologic: A&O X 3;  No focal weakness or paresthesias are detected Psychiatric:   The pt has Normal affect.   Non-Invasive Vascular Imaging:     +-----------------+-------------+----------+---------+  Right Cephalic   Diameter (cm)Depth (cm)Findings   +-----------------+-------------+----------+---------+  Shoulder            0.30                          +-----------------+-------------+----------+---------+  Prox upper arm       0.35               branching  +-----------------+-------------+----------+---------+  Mid upper arm        0.35               branching  +-----------------+-------------+----------+---------+  Dist upper arm    0.36 / .040           branching  +-----------------+-------------+----------+---------+  Antecubital fossa    0.39                          +-----------------+-------------+----------+---------+  Prox forearm      0.33 / 0.30                      +-----------------+-------------+----------+---------+  Mid forearm          0.29                          +-----------------+-------------+----------+---------+  Dist forearm         0.28                          +-----------------+-------------+----------+---------+   +-----------------+-------------+----------+---------+  Right Basilic    Diameter (cm)Depth (cm)Findings   +-----------------+-------------+----------+---------+  Prox upper arm       0.55               branching  +-----------------+-------------+----------+---------+  Mid upper arm        0.32                          +-----------------+-------------+----------+---------+  Dist upper arm    0.34 / 0.24           branching  +-----------------+-------------+----------+---------+  Antecubital fossa    0.27                          +-----------------+-------------+----------+---------+  Prox forearm  0.29                          +-----------------+-------------+----------+---------+    +-----------------+-------------+----------+---------+  Left Cephalic    Diameter (cm)Depth (cm)Findings   +-----------------+-------------+----------+---------+  Shoulder            0.46                          +-----------------+-------------+----------+---------+  Prox upper arm       0.38               branching  +-----------------+-------------+----------+---------+  Mid upper arm        0.38               branching  +-----------------+-------------+----------+---------+  Dist upper arm       0.44                          +-----------------+-------------+----------+---------+  Antecubital fossa    0.39                          +-----------------+-------------+----------+---------+  Prox forearm      0.35 / 0.41                      +-----------------+-------------+----------+---------+  Mid forearm          0.32                          +-----------------+-------------+----------+---------+  Dist forearm         0.29                          +-----------------+-------------+----------+---------+   +-----------------+----------------+----------+---------+  Left Basilic      Diameter (cm)  Depth (cm)Findings   +-----------------+----------------+----------+---------+  Prox upper arm   0.32 / 0.36 0.35                     +-----------------+----------------+----------+---------+  Mid upper arm          0.36                branching  +-----------------+----------------+----------+---------+  Dist upper arm         0.40                           +-----------------+----------------+----------+---------+  Antecubital fossa      0.22                           +-----------------+----------------+----------+---------+  Prox forearm           0.26                           +-----------------+----------------+----------+---------+     ASSESSMENT/PLAN:  Timothy Holloway is a 67 y.o. male who presents with  chronic kidney disease stage 4  Based on vein mapping and examination, parents would be best treated with left-sided brachiocephalic fistula creation. I had an extensive discussion with this patient in regards to the nature of access surgery, including risk, benefits, and alternatives.   The patient is aware that the risks of access surgery include but are not limited to: bleeding, infection,  steal syndrome, nerve damage, ischemic monomelic neuropathy, failure of access to mature, complications related to venous hypertension, and possible need for additional access procedures in the future. I discussed with the patient the nature of the staged access procedure, specifically the need for a second operation to transpose the first stage fistula if it matures adequately.   The patient has agreed to proceed with the above procedure which will be scheduled for next Tuesday.  Victorino Sparrow, MD [facility] [phone] Total time of patient care including pre-visit research, consultation, and documentation greater than 30 minutes

## 2022-12-08 ENCOUNTER — Ambulatory Visit: Payer: BC Managed Care – PPO | Admitting: Vascular Surgery

## 2023-02-17 NOTE — Telephone Encounter (Signed)
 Timothy Holloway

## 2023-08-21 ENCOUNTER — Encounter (HOSPITAL_COMMUNITY): Payer: Self-pay

## 2023-08-21 ENCOUNTER — Emergency Department (HOSPITAL_COMMUNITY)
Admission: EM | Admit: 2023-08-21 | Discharge: 2023-08-21 | Disposition: A | Discharge status: Home / Self Care | Attending: Emergency Medicine | Admitting: Emergency Medicine

## 2023-08-21 ENCOUNTER — Other Ambulatory Visit: Payer: Self-pay

## 2023-08-21 ENCOUNTER — Encounter (HOSPITAL_COMMUNITY): Payer: Self-pay | Admitting: Emergency Medicine

## 2023-08-21 DIAGNOSIS — R519 Headache, unspecified: Secondary | ICD-10-CM | POA: Insufficient documentation

## 2023-08-21 DIAGNOSIS — R42 Dizziness and giddiness: Secondary | ICD-10-CM | POA: Insufficient documentation

## 2023-08-21 DIAGNOSIS — N186 End stage renal disease: Secondary | ICD-10-CM | POA: Insufficient documentation

## 2023-08-21 DIAGNOSIS — Z91040 Latex allergy status: Secondary | ICD-10-CM | POA: Diagnosis not present

## 2023-08-21 DIAGNOSIS — Z7982 Long term (current) use of aspirin: Secondary | ICD-10-CM | POA: Diagnosis not present

## 2023-08-21 DIAGNOSIS — Z992 Dependence on renal dialysis: Secondary | ICD-10-CM | POA: Insufficient documentation

## 2023-08-21 LAB — CBC WITH DIFFERENTIAL/PLATELET
Abs Immature Granulocytes: 0.1 K/uL — ABNORMAL HIGH (ref 0.00–0.07)
Basophils Absolute: 0 K/uL (ref 0.0–0.1)
Basophils Relative: 0 %
Eosinophils Absolute: 0.3 K/uL (ref 0.0–0.5)
Eosinophils Relative: 4 %
HCT: 33.4 % — ABNORMAL LOW (ref 39.0–52.0)
Hemoglobin: 10.5 g/dL — ABNORMAL LOW (ref 13.0–17.0)
Immature Granulocytes: 1 %
Lymphocytes Relative: 11 %
Lymphs Abs: 1 K/uL (ref 0.7–4.0)
MCH: 30 pg (ref 26.0–34.0)
MCHC: 31.4 g/dL (ref 30.0–36.0)
MCV: 95.4 fL (ref 80.0–100.0)
Monocytes Absolute: 0.5 K/uL (ref 0.1–1.0)
Monocytes Relative: 6 %
Neutro Abs: 6.7 K/uL (ref 1.7–7.7)
Neutrophils Relative %: 78 %
Platelets: 234 K/uL (ref 150–400)
RBC: 3.5 MIL/uL — ABNORMAL LOW (ref 4.22–5.81)
RDW: 16.9 % — ABNORMAL HIGH (ref 11.5–15.5)
WBC: 8.6 K/uL (ref 4.0–10.5)
nRBC: 0 % (ref 0.0–0.2)

## 2023-08-21 LAB — BASIC METABOLIC PANEL WITH GFR
Anion gap: 13 (ref 5–15)
BUN: 63 mg/dL — ABNORMAL HIGH (ref 8–23)
CO2: 22 mmol/L (ref 22–32)
Calcium: 8.4 mg/dL — ABNORMAL LOW (ref 8.9–10.3)
Chloride: 102 mmol/L (ref 98–111)
Creatinine, Ser: 5.45 mg/dL — ABNORMAL HIGH (ref 0.61–1.24)
GFR, Estimated: 11 mL/min — ABNORMAL LOW (ref >60)
Glucose, Bld: 199 mg/dL — ABNORMAL HIGH (ref 70–99)
Potassium: 4.5 mmol/L (ref 3.5–5.1)
Sodium: 137 mmol/L (ref 135–145)

## 2023-08-21 LAB — CBG MONITORING, ED: Glucose-Capillary: 199 mg/dL — ABNORMAL HIGH (ref 70–99)

## 2023-08-21 NOTE — ED Provider Notes (Signed)
 [name] Provider Note   CSN: [id number] Arrival date & time: 08/21/23  1049     Patient presents with: Dizziness   Timothy Holloway is a 68 y.o. male with a history including type 2 diabetes, anemia, end-stage renal disease who was actually at dialysis this morning about 15 minutes into his treatment when he started to feel lightheaded, generalized weakness along with blurred vision, the episode lasted about 30 minutes by his estimation was resolved by the time he arrived here.  His treatment was discontinued when his symptoms began.  He does report having a mild headache when he first arrived at the dialysis center which has since resolved.  He denies chest pain, palpitations, shortness of breath, he currently feels back to his baseline.  Dialysis center is [facility] in [place].  {Add pertinent medical, surgical, social history, OB history to YEP:67052} The history is provided by the patient and the spouse.       Prior to Admission medications   Medication Sig Start Date End Date Taking? Authorizing Provider  acetaminophen  (TYLENOL ) 325 MG tablet Take 650 mg by mouth every 6 (six) hours as needed for moderate pain. 06/06/20   [provider]  aspirin  81 MG tablet Take 81 mg by mouth daily.    [provider]  atorvastatin  (LIPITOR) 10 MG tablet Take 1 tablet (10 mg total) by mouth daily. Patient taking differently: Take 10 mg by mouth at bedtime. 08/06/12   Cyrena Gwenn SQUIBB, MD  betamethasone dipropionate 0.05 % cream Apply 1 Application topically daily as needed (on leg). 04/28/21   [provider]  carvedilol  (COREG ) 3.125 MG tablet Take 1 tablet (3.125 mg total) by mouth 2 (two) times daily with a meal. Patient taking differently: Take 6.25 mg by mouth 2 (two) times daily with a meal. 09/13/20   Cheryle Page, MD  Cholecalciferol (VITAMIN D) 2000 UNITS CAPS Take 2,000 Units by mouth at bedtime.    [provider]  diclofenac Sodium (VOLTAREN) 1 % GEL Apply 2 g topically daily as needed (pain).    [provider]  Dulaglutide  (TRULICITY ) 3 MG/0.5ML SOPN Inject 3 mg into the skin once a week. Patient taking differently: Inject 3 mg into the skin once a week. Sunday 06/03/22     FARXIGA 10 MG TABS tablet Take 10 mg by mouth at bedtime.    [provider]  fenofibrate  (TRICOR ) 145 MG tablet Take 1/2 tablet daily Patient taking differently: Take 72.5 mg by mouth every morning. 09/06/13   Georgina Nancyann ORN, MD  furosemide  (LASIX ) 80 MG tablet Take 80 mg by mouth daily. 12/28/21   [provider]  Glucosamine-Chondroit-Vit C-Mn (GLUCOSAMINE CHONDR 1500 COMPLX) CAPS Take 1 capsule by mouth 2 (two) times daily.    [provider]  HUMALOG KWIKPEN 200 UNIT/ML KwikPen Inject into the skin with breakfast, with lunch, and with evening meal. Sliding scale 07/19/21   [provider]  hydrALAZINE  (APRESOLINE ) 50 MG tablet Take 1 tablet (50 mg total) by mouth every 8 (eight) hours. 07/23/21   Singh, Prashant K, MD  hydrochlorothiazide  (HYDRODIURIL ) 25 MG tablet Take 25 mg by mouth daily. 12/29/21   [provider]  HYDROcodone -acetaminophen  (NORCO) 5-325 MG tablet Take 1 tablet by mouth every 6 (six) hours as needed for moderate pain. 11/08/22   Rhyne, Samantha J, PA-C  isosorbide mononitrate (IMDUR) 30 MG 24 hr tablet Take 30 mg by mouth at bedtime.  [provider]  LANTUS SOLOSTAR 100 UNIT/ML Solostar Pen Inject 50 Units into the skin at bedtime. 10/25/21   [provider]  losartan (COZAAR) 100 MG tablet Take 100 mg by mouth at bedtime. 10/25/21   [provider]  meclizine (ANTIVERT) 25 MG tablet Take 25 mg by mouth daily as needed for dizziness. 04/24/15   [provider]  methocarbamol (ROBAXIN) 500 MG tablet Take 1 tablet by mouth daily as needed for muscle spasms. 10/14/19   [provider]  Multiple Vitamin (MULTIVITAMIN)  tablet Take 1 tablet by mouth daily.    [provider]  sertraline (ZOLOFT) 50 MG tablet Take 50 mg by mouth daily as needed (mood). 12/30/21   [provider]  sodium chloride  (OCEAN) 0.65 % SOLN nasal spray Place 1 spray into both nostrils daily as needed for congestion.    [provider]  zolpidem  (AMBIEN ) 10 MG tablet Take 1 tablet (10 mg total) by mouth at bedtime as needed for sleep. 01/11/13   Cyrena Gwenn SQUIBB, MD    Allergies: Latex and Lodine [etodolac]    Review of Systems  Constitutional:  Negative for chills and fever.  HENT:  Negative for congestion and sore throat.   Eyes:  Positive for visual disturbance.  Respiratory:  Negative for chest tightness and shortness of breath.   Cardiovascular:  Negative for chest pain.  Gastrointestinal:  Negative for abdominal pain and nausea.  Genitourinary: Negative.   Musculoskeletal:  Negative for arthralgias, joint swelling and neck pain.  Skin: Negative.  Negative for rash and wound.  Neurological:  Positive for weakness. Negative for dizziness, light-headedness, numbness and headaches.  Psychiatric/Behavioral: Negative.      Updated Vital Signs BP (!) 139/53   Pulse 62   Temp 98 F (36.7 C) (Oral)   Resp 19   Ht 6' (1.829 m)   Wt 131.1 kg   SpO2 95%   BMI 39.20 kg/m   Physical Exam Vitals and nursing note reviewed.  Constitutional:      Appearance: He is well-developed.  HENT:     Head: Normocephalic and atraumatic.  Eyes:     Conjunctiva/sclera: Conjunctivae normal.  Cardiovascular:     Rate and Rhythm: Normal rate and regular rhythm.     Heart sounds: Normal heart sounds.  Pulmonary:     Effort: Pulmonary effort is normal.     Breath sounds: Normal breath sounds. No wheezing.  Abdominal:     General: Bowel sounds are normal.     Palpations: Abdomen is soft.     Tenderness: There is no abdominal tenderness.  Musculoskeletal:        General: Normal range of motion.     Cervical  back: Normal range of motion.  Skin:    General: Skin is warm and dry.     Coloration: Skin is pale.  Neurological:     General: No focal deficit present.     Mental Status: He is alert and oriented to person, place, and time.     (all labs ordered are listed, but only abnormal results are displayed) Labs Reviewed  CBG MONITORING, ED - Abnormal; Notable for the following components:      Result Value   Glucose-Capillary 199 (*)    All other components within normal limits  CBC WITH DIFFERENTIAL/PLATELET  BASIC METABOLIC PANEL WITH GFR    EKG: None  Radiology: No results found.  {Document cardiac monitor, telemetry assessment procedure when appropriate:32947} Procedures   Medications  Ordered in the ED - No data to display    {Click here for ABCD2, HEART and other calculators REFRESH Note before signing:1}                              Medical Decision Making    {Document critical care time when appropriate  Document review of labs and clinical decision tools ie [id number], etc  Document your independent review of radiology images and any outside records  Document your discussion with family members, caretakers and with consultants  Document social determinants of health affecting pt's care  Document your decision making why or why not admission, treatments were needed:32947:::1}   Final diagnoses:  None    ED Discharge Orders     None

## 2023-08-21 NOTE — Discharge Instructions (Signed)
 Your lab tests and exam today are reassuring.  Please follow up with your primary MD for a recheck of your dizziness symptoms, especially if they return or become more frequent.

## 2023-08-21 NOTE — ED Triage Notes (Signed)
 Pt bib rcems for dizziness during dialysis treatment.

## 2023-08-26 IMAGING — US US RENAL
2 series · 14 of 25 positions shown · non-contrast
Comparison: Ultrasound 09/10/2020

CLINICAL DATA: Acute kidney injury

EXAM:
RENAL / URINARY TRACT ULTRASOUND COMPLETE

[Series 1: us renal · 13 of 41 slices shown (1 of 2)]
[im 1/41]
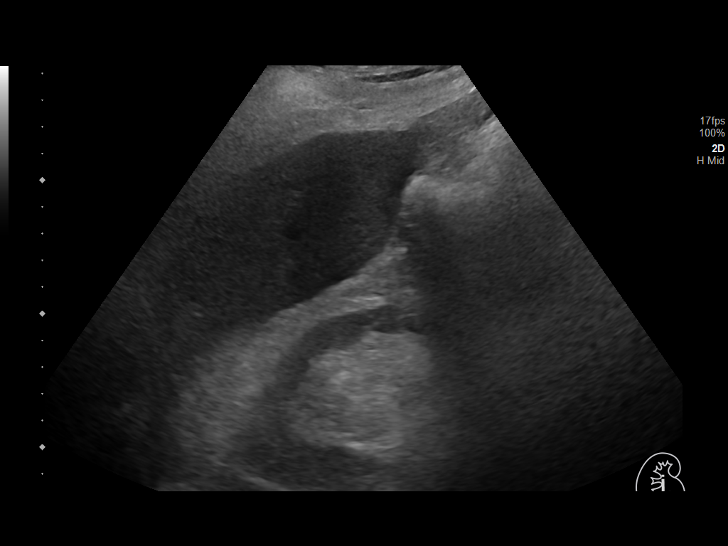
[im 4/41]
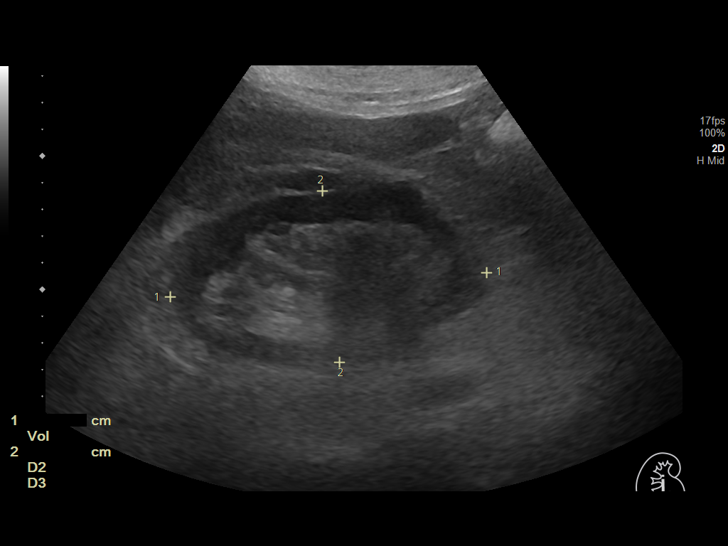
[im 8/41]
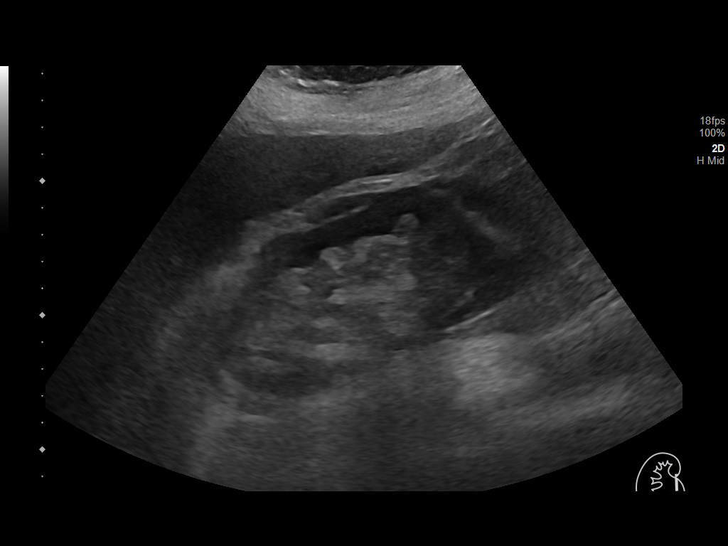
[im 11/41]
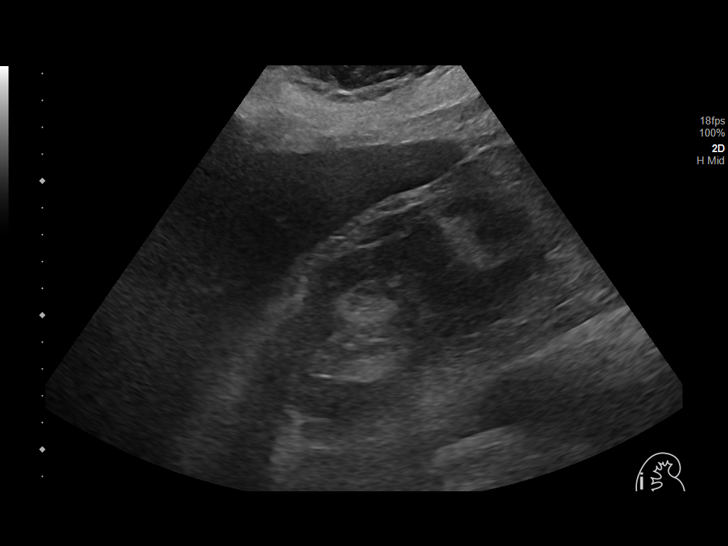
[im 15/41]
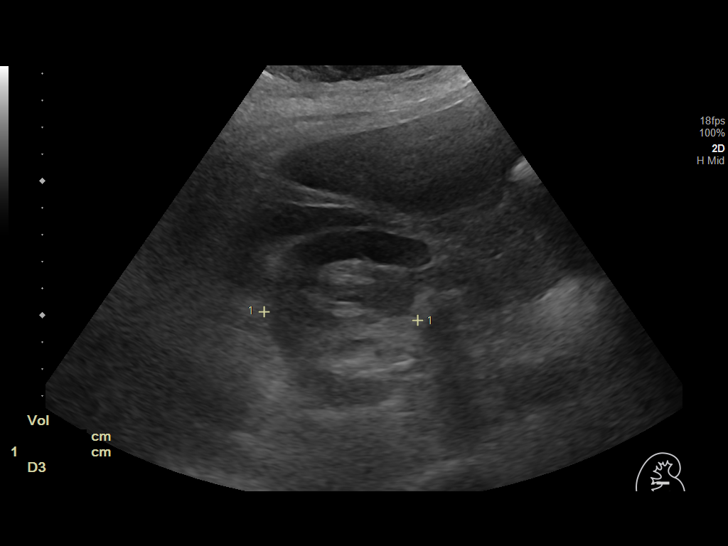
[im 17/41]
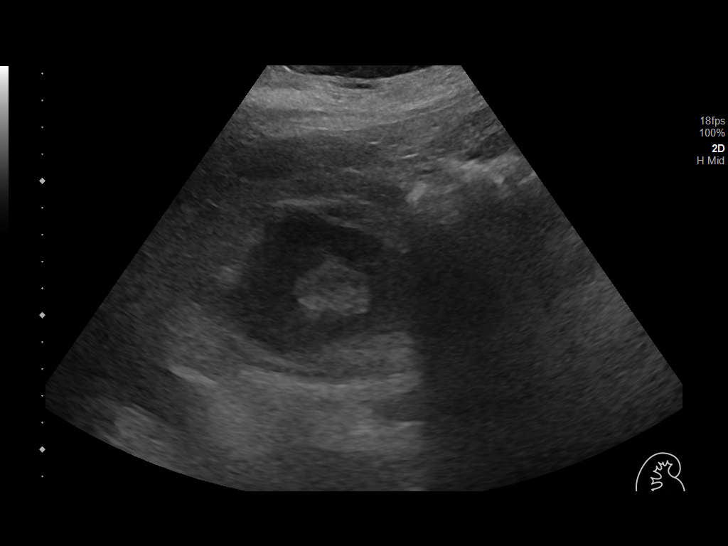
[im 21/41]
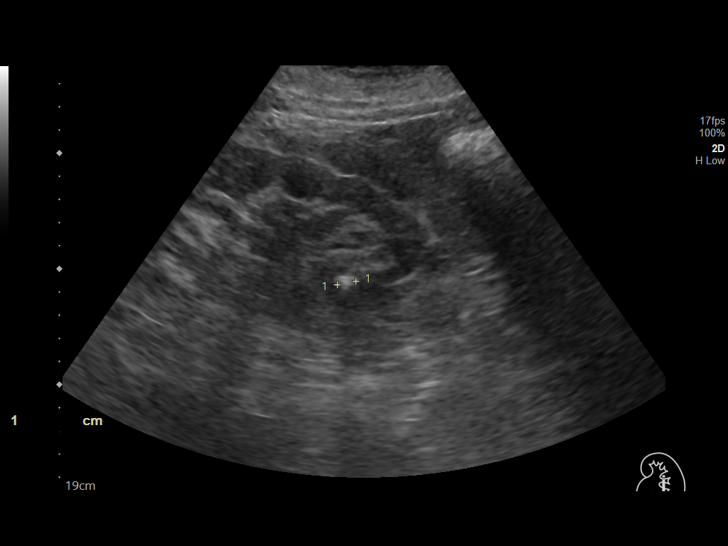
[im 24/41]
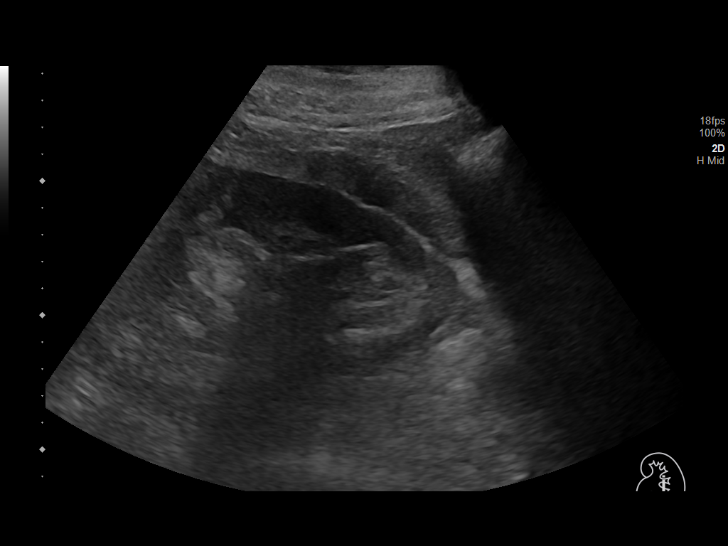
[im 28/41]
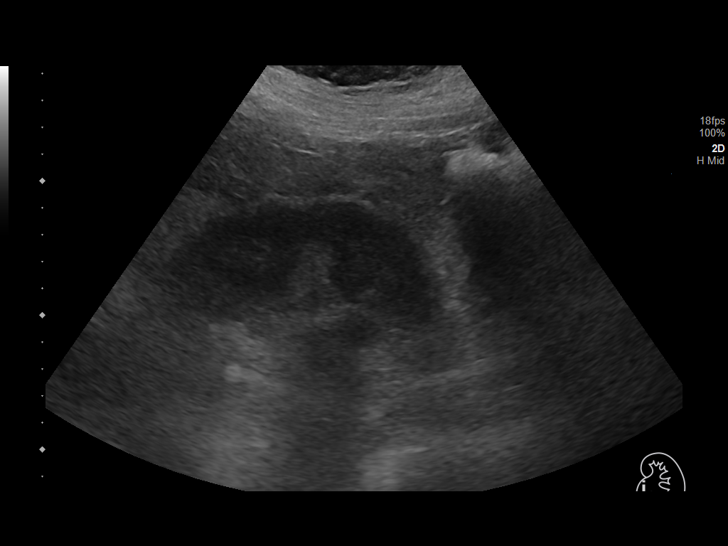
[im 30/41]
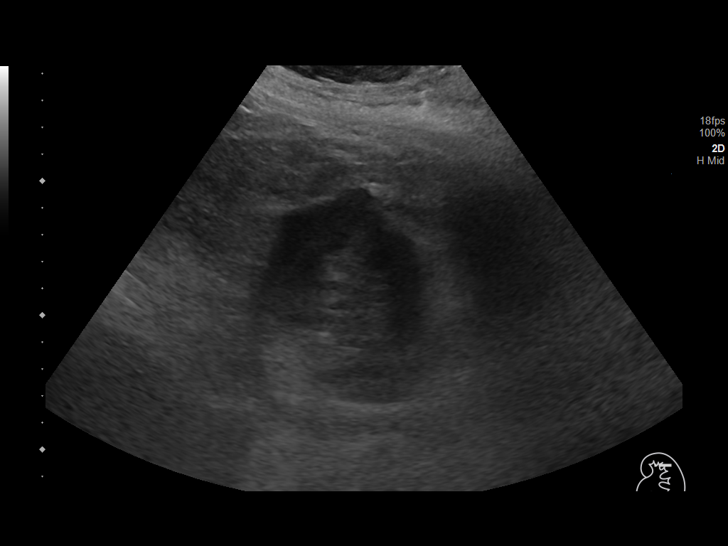
[im 33/41]
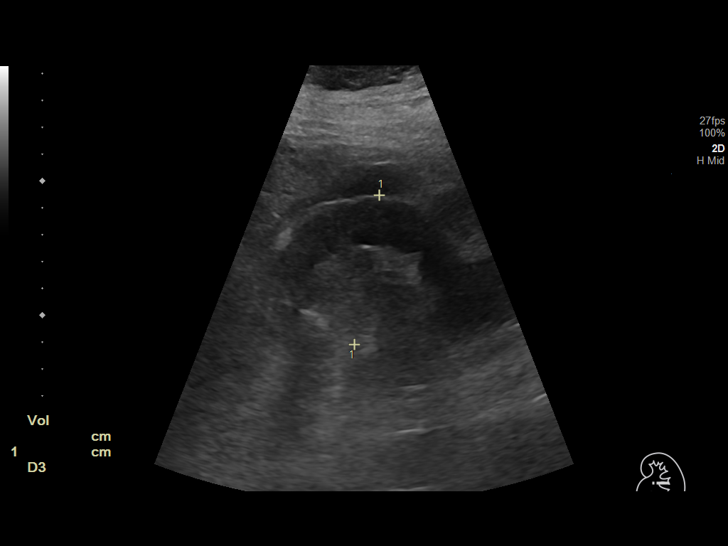
[im 37/41]
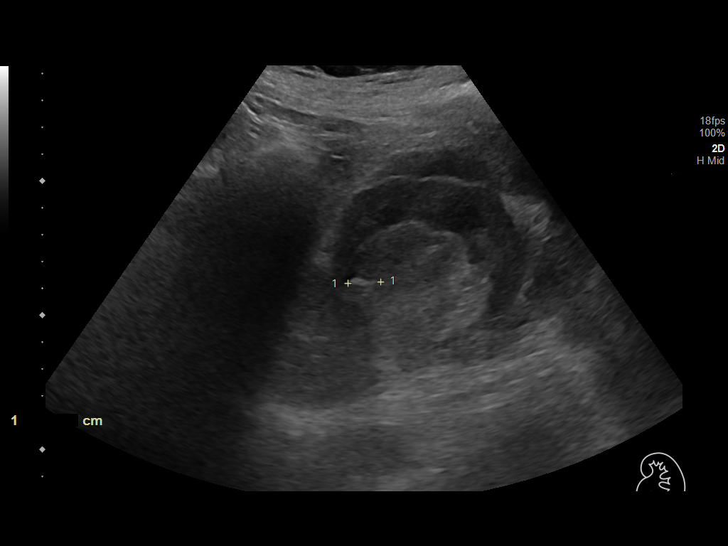
[im 41/41]
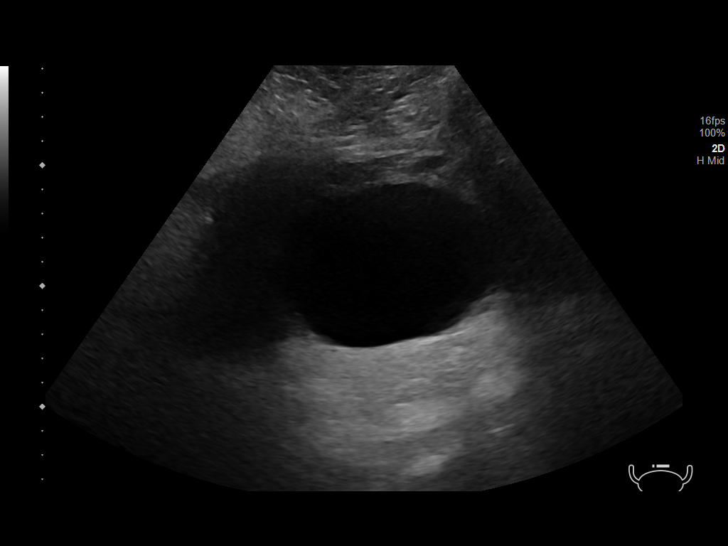

[Series 1001: us renal · 1 of 4 slices shown (2 of 2)]
[im 4/4]
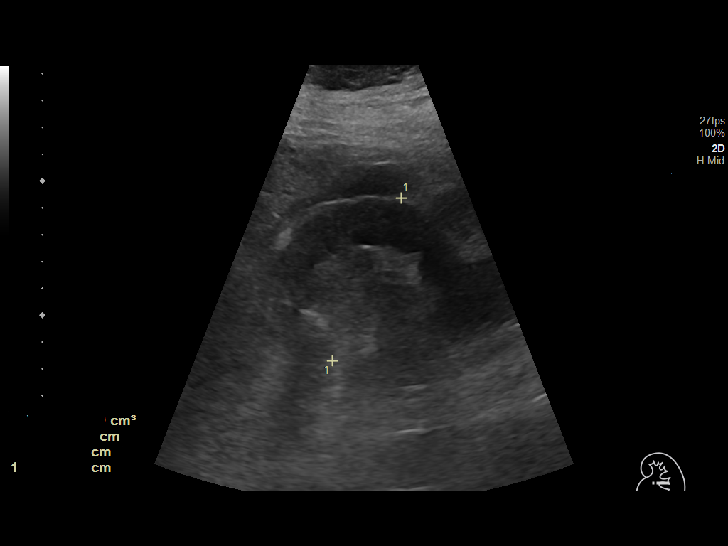

[14 of 25 positions shown; findings below may reference images not displayed]

FINDINGS: Right Kidney:

Renal measurements: 11.7 x 6.7 x 5.8 cm = volume: 235.8 mL.
Echogenicity within normal limits. No mass or hydronephrosis
visualized.

Left Kidney:

Renal measurements: 13.3 x 7.6 x 6.6 cm = volume: 348 mL.
Echogenicity within normal limits. No mass or hydronephrosis.
Probable 8 mm stone lower pole left kidney.

Bladder:

Appears normal for degree of bladder distention.

Other:

None.
IMPRESSION: 1. Negative for hydronephrosis.
2. Probable small left kidney stone

## 2023-10-20 ENCOUNTER — Encounter (HOSPITAL_COMMUNITY): Payer: Self-pay

## 2023-10-21 ENCOUNTER — Emergency Department (HOSPITAL_COMMUNITY)
Admission: EM | Admit: 2023-10-21 | Discharge: 2023-10-21 | Disposition: A | Discharge status: Home / Self Care | Attending: Emergency Medicine | Admitting: Emergency Medicine

## 2023-10-21 ENCOUNTER — Encounter (HOSPITAL_COMMUNITY): Payer: Self-pay

## 2023-10-21 ENCOUNTER — Other Ambulatory Visit: Payer: Self-pay

## 2023-10-21 ENCOUNTER — Emergency Department (HOSPITAL_COMMUNITY)

## 2023-10-21 DIAGNOSIS — I120 Hypertensive chronic kidney disease with stage 5 chronic kidney disease or end stage renal disease: Secondary | ICD-10-CM | POA: Insufficient documentation

## 2023-10-21 DIAGNOSIS — Z992 Dependence on renal dialysis: Secondary | ICD-10-CM | POA: Insufficient documentation

## 2023-10-21 DIAGNOSIS — T82898A Other specified complication of vascular prosthetic devices, implants and grafts, initial encounter: Secondary | ICD-10-CM | POA: Diagnosis present

## 2023-10-21 DIAGNOSIS — Z789 Other specified health status: Secondary | ICD-10-CM

## 2023-10-21 DIAGNOSIS — N186 End stage renal disease: Secondary | ICD-10-CM | POA: Insufficient documentation

## 2023-10-21 DIAGNOSIS — Z91040 Latex allergy status: Secondary | ICD-10-CM | POA: Insufficient documentation

## 2023-10-21 DIAGNOSIS — Z7982 Long term (current) use of aspirin: Secondary | ICD-10-CM | POA: Insufficient documentation

## 2023-10-21 DIAGNOSIS — Y841 Kidney dialysis as the cause of abnormal reaction of the patient, or of later complication, without mention of misadventure at the time of the procedure: Secondary | ICD-10-CM | POA: Diagnosis not present

## 2023-10-21 DIAGNOSIS — T829XXA Unspecified complication of cardiac and vascular prosthetic device, implant and graft, initial encounter: Secondary | ICD-10-CM

## 2023-10-21 LAB — COMPREHENSIVE METABOLIC PANEL WITH GFR
ALT: 15 U/L (ref 0–44)
AST: 17 U/L (ref 15–41)
Albumin: 3.1 g/dL — ABNORMAL LOW (ref 3.5–5.0)
Alkaline Phosphatase: 56 U/L (ref 38–126)
Anion gap: 16 — ABNORMAL HIGH (ref 5–15)
BUN: 41 mg/dL — ABNORMAL HIGH (ref 8–23)
CO2: 24 mmol/L (ref 22–32)
Calcium: 9.3 mg/dL (ref 8.9–10.3)
Chloride: 98 mmol/L (ref 98–111)
Creatinine, Ser: 5.03 mg/dL — ABNORMAL HIGH (ref 0.61–1.24)
GFR, Estimated: 12 mL/min — ABNORMAL LOW (ref >60)
Glucose, Bld: 159 mg/dL — ABNORMAL HIGH (ref 70–99)
Potassium: 4 mmol/L (ref 3.5–5.1)
Sodium: 138 mmol/L (ref 135–145)
Total Bilirubin: 0.7 mg/dL (ref 0.0–1.2)
Total Protein: 6.9 g/dL (ref 6.5–8.1)

## 2023-10-21 LAB — CBC WITH DIFFERENTIAL/PLATELET
Abs Immature Granulocytes: 0.1 K/uL — ABNORMAL HIGH (ref 0.00–0.07)
Basophils Absolute: 0.1 K/uL (ref 0.0–0.1)
Basophils Relative: 1 %
Eosinophils Absolute: 0.6 K/uL — ABNORMAL HIGH (ref 0.0–0.5)
Eosinophils Relative: 7 %
HCT: 33.3 % — ABNORMAL LOW (ref 39.0–52.0)
Hemoglobin: 10.6 g/dL — ABNORMAL LOW (ref 13.0–17.0)
Immature Granulocytes: 1 %
Lymphocytes Relative: 14 %
Lymphs Abs: 1.3 K/uL (ref 0.7–4.0)
MCH: 30.6 pg (ref 26.0–34.0)
MCHC: 31.8 g/dL (ref 30.0–36.0)
MCV: 96.2 fL (ref 80.0–100.0)
Monocytes Absolute: 0.9 K/uL (ref 0.1–1.0)
Monocytes Relative: 10 %
Neutro Abs: 5.9 K/uL (ref 1.7–7.7)
Neutrophils Relative %: 67 %
Platelets: 216 K/uL (ref 150–400)
RBC: 3.46 MIL/uL — ABNORMAL LOW (ref 4.22–5.81)
RDW: 15.7 % — ABNORMAL HIGH (ref 11.5–15.5)
WBC: 8.8 K/uL (ref 4.0–10.5)
nRBC: 0 % (ref 0.0–0.2)

## 2023-10-21 LAB — TYPE AND SCREEN
ABO/RH(D): O POS
Antibody Screen: NEGATIVE

## 2023-10-21 MED ORDER — CARVEDILOL 3.125 MG PO TABS
6.2500 mg | ORAL_TABLET | Freq: Once | ORAL | Status: AC
  Administered 2023-10-21: 6.25 mg via ORAL
  Filled 2023-10-21: qty 2

## 2023-10-21 MED ORDER — HYDRALAZINE HCL 25 MG PO TABS
50.0000 mg | ORAL_TABLET | Freq: Once | ORAL | Status: AC
  Administered 2023-10-21: 50 mg via ORAL
  Filled 2023-10-21: qty 2

## 2023-10-21 MED ORDER — HYDROCHLOROTHIAZIDE 25 MG PO TABS
25.0000 mg | ORAL_TABLET | Freq: Every day | ORAL | Status: DC
  Administered 2023-10-21: 25 mg via ORAL
  Filled 2023-10-21: qty 1

## 2023-10-21 NOTE — ED Provider Notes (Signed)
 [place] EMERGENCY DEPARTMENT AT [facility] Provider Note   CSN: [id number] Arrival date & time: 10/21/23  1014     Patient presents with: Vascular Access Problem   Timothy Holloway is a 68 y.o. male who presents to the emergency department with a chief complaint of vascular access problem.  Patient states that he has a Monday Wednesday Friday dialysis patient and that he had a full session of dialysis all 3 days this week, however yesterday they noticed that he was having some bleeding surrounding his dialysis catheter site on his chest.  Patient states that due to this he has a preadmission on 10/23/2023 at the vascular center for tunneled dialysis catheter replacement.  Patient states that however this morning whenever he woke up he noticed that the dialysis catheter was pulled much farther out of his chest and became concerned.  He attempted to call the vascular center and was referred to the emergency department.  Patient denies significant bleeding around the site, denies chest pain, shortness of breath, headache, abdominal pain, nausea, vomiting, or any other symptom.  Patient states that he feels pretty good his catheter is just falling out.  Denies fever or chills.  Past medical history significant for hypertension, hyperlipidemia, erectile dysfunction, diabetes, chronic kidney disease, anemia, etc. Patient states that he has not taken any of his blood pressure medications or eaten anything today because they were unsure what was going to happen at the emergency department.   HPI     Prior to Admission medications   Medication Sig Start Date End Date Taking? Authorizing Provider  acetaminophen  (TYLENOL ) 325 MG tablet Take 650 mg by mouth every 6 (six) hours as needed for moderate pain. 06/06/20   [provider]  aspirin  81 MG tablet Take 81 mg by mouth daily.    [provider]  atorvastatin  (LIPITOR) 10 MG tablet Take 1 tablet (10 mg total) by mouth  daily. Patient taking differently: Take 10 mg by mouth at bedtime. 08/06/12   Cyrena Gwenn SQUIBB, MD  betamethasone dipropionate 0.05 % cream Apply 1 Application topically daily as needed (on leg). 04/28/21   [provider]  carvedilol  (COREG ) 3.125 MG tablet Take 1 tablet (3.125 mg total) by mouth 2 (two) times daily with a meal. Patient taking differently: Take 6.25 mg by mouth 2 (two) times daily with a meal. 09/13/20   Cheryle Page, MD  Cholecalciferol (VITAMIN D) 2000 UNITS CAPS Take 2,000 Units by mouth at bedtime.    [provider]  diclofenac Sodium (VOLTAREN) 1 % GEL Apply 2 g topically daily as needed (pain).    [provider]  Dulaglutide  (TRULICITY ) 3 MG/0.5ML SOPN Inject 3 mg into the skin once a week. Patient taking differently: Inject 3 mg into the skin once a week. Sunday 06/03/22     FARXIGA 10 MG TABS tablet Take 10 mg by mouth at bedtime.    [provider]  fenofibrate  (TRICOR ) 145 MG tablet Take 1/2 tablet daily Patient taking differently: Take 72.5 mg by mouth every morning. 09/06/13   Georgina Nancyann ORN, MD  furosemide  (LASIX ) 80 MG tablet Take 80 mg by mouth daily. 12/28/21   [provider]  Glucosamine-Chondroit-Vit C-Mn (GLUCOSAMINE CHONDR 1500 COMPLX) CAPS Take 1 capsule by mouth 2 (two) times daily.    [provider]  HUMALOG KWIKPEN 200 UNIT/ML KwikPen Inject into the skin with breakfast, with lunch, and with evening meal. Sliding scale 07/19/21   [provider]  hydrALAZINE  (APRESOLINE ) 50 MG tablet Take 1 tablet (50 mg total) by mouth every 8 (eight) hours. 07/23/21   Singh, Prashant K, MD  hydrochlorothiazide  (HYDRODIURIL ) 25 MG tablet Take 25 mg by mouth daily. 12/29/21   [provider]  HYDROcodone -acetaminophen  (NORCO) 5-325 MG tablet Take 1 tablet by mouth every 6 (six) hours as needed for moderate pain. 11/08/22   Rhyne, Samantha J, PA-C  isosorbide mononitrate (IMDUR) 30 MG 24 hr tablet Take 30 mg  by mouth at bedtime.    [provider]  LANTUS SOLOSTAR 100 UNIT/ML Solostar Pen Inject 50 Units into the skin at bedtime. 10/25/21   [provider]  losartan (COZAAR) 100 MG tablet Take 100 mg by mouth at bedtime. 10/25/21   [provider]  meclizine (ANTIVERT) 25 MG tablet Take 25 mg by mouth daily as needed for dizziness. 04/24/15   [provider]  methocarbamol (ROBAXIN) 500 MG tablet Take 1 tablet by mouth daily as needed for muscle spasms. 10/14/19   [provider]  Multiple Vitamin (MULTIVITAMIN) tablet Take 1 tablet by mouth daily.    [provider]  sertraline (ZOLOFT) 50 MG tablet Take 50 mg by mouth daily as needed (mood). 12/30/21   [provider]  sodium chloride  (OCEAN) 0.65 % SOLN nasal spray Place 1 spray into both nostrils daily as needed for congestion.    [provider]  zolpidem  (AMBIEN ) 10 MG tablet Take 1 tablet (10 mg total) by mouth at bedtime as needed for sleep. 01/11/13   Cyrena Gwenn SQUIBB, MD    Allergies: Latex and Lodine [etodolac]    Review of Systems  Skin:        Tunneled dialysis catheter present to the chest but pulled very far out of chest    Updated Vital Signs BP (!) 166/68   Pulse 60   Temp 98.3 F (36.8 C) (Oral)   Resp 17   Ht 6' (1.829 m)   Wt 131.5 kg   SpO2 98%   BMI 39.33 kg/m   Physical Exam Vitals and nursing note reviewed.  Constitutional:      General: He is awake. He is not in acute distress.    Appearance: Normal appearance. He is not ill-appearing, toxic-appearing or diaphoretic.  HENT:     Head: Normocephalic and atraumatic.  Eyes:     General: No scleral icterus. Cardiovascular:     Rate and Rhythm: Normal rate and regular rhythm.  Pulmonary:     Effort: Pulmonary effort is normal. No respiratory distress.     Breath sounds: Normal breath sounds. No wheezing, rhonchi or rales.  Abdominal:     General: Abdomen is flat.     Palpations: Abdomen is  soft.  Musculoskeletal:        General: Normal range of motion.     Right lower leg: No edema.     Left lower leg: No edema.  Skin:    General: Skin is warm.     Capillary Refill: Capillary refill takes less than 2 seconds.     Comments: Tunneled dialysis catheter present on right chest wall, dialysis catheter looks pulled very far out, no sutures in place holding dialysis catheter to skin, bandages initially placed over port site through skin however upon removal of bandage dialysis catheter is actually not in the skin anymore, it has been completely removed, no active bleeding all of the site, no signs of infection  Neurological:     General: No focal deficit  present.     Mental Status: He is alert and oriented to person, place, and time.  Psychiatric:        Mood and Affect: Mood normal.        Behavior: Behavior normal. Behavior is cooperative.            (all labs ordered are listed, but only abnormal results are displayed) Labs Reviewed  CBC WITH DIFFERENTIAL/PLATELET - Abnormal; Notable for the following components:      Result Value   RBC 3.46 (*)    Hemoglobin 10.6 (*)    HCT 33.3 (*)    RDW 15.7 (*)    Eosinophils Absolute 0.6 (*)    Abs Immature Granulocytes 0.10 (*)    All other components within normal limits  COMPREHENSIVE METABOLIC PANEL WITH GFR - Abnormal; Notable for the following components:   Glucose, Bld 159 (*)    BUN 41 (*)    Creatinine, Ser 5.03 (*)    Albumin  3.1 (*)    GFR, Estimated 12 (*)    Anion gap 16 (*)    All other components within normal limits  TYPE AND SCREEN    EKG: None -EKG by my interpretation shows no acute ischemic changes or evidence of hyperkalemia Radiology: DG Chest Portable 1 View Result Date: 10/21/2023 CLINICAL DATA:  Dislodged right chest hemodialysis catheter. Evaluate for retained fragment. EXAM: PORTABLE CHEST 1 VIEW COMPARISON:  Chest x-ray 10/21/2023 FINDINGS: There is no catheter or catheter fragment  identified in the chest. The heart is enlarged, unchanged. There are atherosclerotic calcifications of the aorta. There is no focal lung infiltrate, pleural effusion or pneumothorax. No acute fractures are seen. IMPRESSION: 1. No catheter or catheter fragment identified in the chest. 2. Cardiomegaly. Electronically Signed   By: Greig Pique M.D.   On: 10/21/2023 15:53   DG Chest 2 View Result Date: 10/21/2023 CLINICAL DATA:  Chronic kidney disease. On dialysis. Catheter problem. EXAM: CHEST - 2 VIEW COMPARISON:  11/11/2022 FINDINGS: Right-sided dual lumen central venous dialysis catheter is in abnormal position. The catheter overlies the lateral right lung, with the tip overlying the lateral right upper lung field. This is in uncertain location, but may be within the distal right axillary or proximal right subclavian vein. No pneumothorax visualized. Mild cardiomegaly stable.  Both lungs are clear. IMPRESSION: Abnormal position of right-sided central venous dialysis catheter, as described above. No pneumothorax visualized. Stable mild cardiomegaly. No active lung disease. Electronically Signed   By: Norleen DELENA Kil M.D.   On: 10/21/2023 13:19     Procedures   Medications Ordered in the ED  carvedilol  (COREG ) tablet 6.25 mg (6.25 mg Oral Given 10/21/23 1525)  hydrALAZINE  (APRESOLINE ) tablet 50 mg (50 mg Oral Given 10/21/23 1525)                                    Medical Decision Making Amount and/or Complexity of Data Reviewed Labs: ordered. Radiology: ordered.  Risk Prescription drug management.   Patient presents to the ED for concern of vascular access problem, this involves an extensive number of treatment options, and is a complaint that carries with it a high risk of complications and morbidity.  The differential diagnosis includes removal of catheter, catheter dysfunction, bleeding, infection, missed dialysis session, electrolyte abnormality, shortness of breath, arrhythmia, etc.   Co  morbidities that complicate the patient evaluation  hypertension, hyperlipidemia, erectile dysfunction, diabetes, chronic kidney  disease, anemia   Additional history obtained:  Additional history obtained from chart review which looks like patient has a preadmit completed on 10/23/2023 for vascular access problem, tunneled dialysis catheter exchange   Lab Tests:  I Ordered, and personally interpreted labs.  The pertinent results include:  CBC significant for hemoglobin of 10.6 which seems about baseline for patient, CMP significant for creatinine of 5.03, potassium not elevated at 4   Imaging Studies ordered:  I ordered imaging studies including chest x-ray, and portable chest x-ray I independently visualized and interpreted imaging which showed: Initial chest x-ray shows dialysis catheter misplacement however at this time dialysis catheter was completely removed and only just taped onto chest due to bandaging, repeat chest x-ray shows no catheter or catheter fragments I agree with the radiologist interpretation   Cardiac Monitoring:  The patient was maintained on a cardiac monitor.  I personally viewed and interpreted the cardiac monitored which showed an underlying rhythm of: Sinus rhythm   Medicines ordered and prescription drug management:  I ordered medication including carvedilol , hydralazine , hydrochlorothiazide  for hypertension Reevaluation of the patient after these medicines showed that the patient improved I have reviewed the patients home medicines and have made adjustments as needed   Test Considered:  None   Critical Interventions:  None   Problem List / ED Course:  68 year old male, extensive past medical history including chronic kidney disease on dialysis Monday Wednesday Friday, presents to the emergency department with a vascular access problem, patient has tunneled dialysis catheter present to right chest wall but this has somehow been removed, no  sutures holding catheter in place, patient denies current symptoms Will obtain basic labs including CBC, CMP, EKG, type and screen due to bleeding risk Patient is not on a blood thinner Labs overall reassuring, hemoglobin seems stable at 10.6, no elevated potassium on CMP, this makes sense as patient states he has been compliant with dialysis with his most recent dialysis session being yesterday, initial chest x-ray showed misplacement of hemodialysis catheter however upon further inspection the catheter had already been removed and nothing is actually underneath the skin, dialysis catheter unattached from patient chest wall as well as all leads and repeat x-ray completed which shows no dialysis catheter or catheter fragments At this time I see no clinical admission criteria for this patient as they do not emergently need dialysis and have good follow-up on Monday already having the dialysis catheter replaced, after discussion with attending Dr. Pamella patient is okay for discharge and follow-up as scheduled on Monday as there is no current need for admission During stay patient given home dose of blood pressure medication with improvement in blood pressure, as patient states that they had not taken any medications today Most likely diagnosis at this time is vascular access problem, accidental removal of tunneled dialysis catheter with no signs of active bleeding or infection, patient vital signs stable   Reevaluation:  After the interventions noted above, I reevaluated the patient and found that they have :improved   Social Determinants of Health:  Dialysis patient   Dispostion:  After consideration of the diagnostic results and the patients response to treatment, I feel that the patient would benefit from discharge and follow-up as scheduled with vascular Center on Monday.     Final diagnoses:  Problem with vascular access  Complication of vascular access for dialysis, initial encounter   ESRD on dialysis [facility])    ED Discharge Orders     None  Daily Doe F, PA-C 10/21/23 2218    Pamella Sharper A, DO 10/23/23 1039

## 2023-10-21 NOTE — Discharge Instructions (Addendum)
 It was a pleasure to take care of you today.  Based on your history, physical exam, labs, and imaging I feel you are safe for discharge.  During your exam today it was noted that your dialysis catheter has been completely removed.  Currently there is no active bleeding and you are not having any acute symptoms.  Please continue to monitor your symptoms, if the site begins to bleed, you may need to return to the emergency department.  Based off of your imaging it does not appear that the catheter broke off, as there is no evidence of any piece of the catheter in your chest.  Please follow-up as scheduled on Monday at the vascular center for the catheter to be replaced.  Please make your primary care provider aware of your visit and workup today.  If you experience any of the following symptoms including but limited to fever, chills, chest pain, shortness of breath, bleeding from catheter site, unexplained weakness, or other concerning symptom please return to the emergency department or seek further medical care.

## 2023-10-21 NOTE — ED Triage Notes (Signed)
 Pt to have dialysis cath exchanged on Monday. Pt woke up with it almost completley out of chest.

## 2023-10-23 ENCOUNTER — Other Ambulatory Visit: Payer: Self-pay

## 2023-10-23 ENCOUNTER — Ambulatory Visit (HOSPITAL_COMMUNITY)
Admission: RE | Admit: 2023-10-23 | Discharge: 2023-10-23 | Disposition: A | Discharge status: Home / Self Care | Attending: Vascular Surgery | Admitting: Vascular Surgery

## 2023-10-23 ENCOUNTER — Encounter (HOSPITAL_COMMUNITY)
Admission: RE | Disposition: A | Discharge status: Home / Self Care | Payer: Self-pay | Source: Home / Self Care | Attending: Vascular Surgery

## 2023-10-23 DIAGNOSIS — N186 End stage renal disease: Secondary | ICD-10-CM | POA: Diagnosis not present

## 2023-10-23 DIAGNOSIS — T8242XA Displacement of vascular dialysis catheter, initial encounter: Secondary | ICD-10-CM | POA: Diagnosis not present

## 2023-10-23 DIAGNOSIS — I120 Hypertensive chronic kidney disease with stage 5 chronic kidney disease or end stage renal disease: Secondary | ICD-10-CM | POA: Insufficient documentation

## 2023-10-23 DIAGNOSIS — Z992 Dependence on renal dialysis: Secondary | ICD-10-CM | POA: Diagnosis not present

## 2023-10-23 DIAGNOSIS — E1122 Type 2 diabetes mellitus with diabetic chronic kidney disease: Secondary | ICD-10-CM | POA: Insufficient documentation

## 2023-10-23 HISTORY — PX: DIALYSIS/PERMA CATHETER INSERTION: CATH118288

## 2023-10-23 LAB — GLUCOSE, CAPILLARY: Glucose-Capillary: 119 mg/dL — ABNORMAL HIGH (ref 70–99)

## 2023-10-23 SURGERY — DIALYSIS/PERMA CATHETER INSERTION
Anesthesia: LOCAL | Site: Chest | Laterality: Right

## 2023-10-23 MED ORDER — HEPARIN SODIUM (PORCINE) 1000 UNIT/ML IJ SOLN
INTRAMUSCULAR | Status: AC
  Filled 2023-10-23: qty 10

## 2023-10-23 MED ORDER — FENTANYL CITRATE (PF) 100 MCG/2ML IJ SOLN
INTRAMUSCULAR | Status: DC | PRN
  Administered 2023-10-23: 50 ug via INTRAVENOUS

## 2023-10-23 MED ORDER — FENTANYL CITRATE (PF) 100 MCG/2ML IJ SOLN
INTRAMUSCULAR | Status: AC
  Filled 2023-10-23: qty 2

## 2023-10-23 MED ORDER — MIDAZOLAM HCL 2 MG/2ML IJ SOLN
INTRAMUSCULAR | Status: AC
  Filled 2023-10-23: qty 2

## 2023-10-23 MED ORDER — LIDOCAINE HCL (PF) 1 % IJ SOLN
INTRAMUSCULAR | Status: DC | PRN
  Administered 2023-10-23: 25 mL

## 2023-10-23 MED ORDER — HEPARIN (PORCINE) IN NACL 1000-0.9 UT/500ML-% IV SOLN
INTRAVENOUS | Status: DC | PRN
  Administered 2023-10-23: 500 mL via SURGICAL_CAVITY

## 2023-10-23 MED ORDER — LIDOCAINE HCL (PF) 1 % IJ SOLN
INTRAMUSCULAR | Status: AC
  Filled 2023-10-23: qty 30

## 2023-10-23 MED ORDER — HEPARIN SODIUM (PORCINE) 1000 UNIT/ML IJ SOLN
INTRAMUSCULAR | Status: DC | PRN
  Administered 2023-10-23: 3200 [IU] via INTRAVENOUS

## 2023-10-23 MED ORDER — MIDAZOLAM HCL 2 MG/2ML IJ SOLN
INTRAMUSCULAR | Status: DC | PRN
  Administered 2023-10-23: 1 mg via INTRAVENOUS

## 2023-10-23 SURGICAL SUPPLY — 5 items
CATH PALINDROME-P 19CM W/VT (CATHETERS) IMPLANT
KIT MICROPUNCTURE NIT STIFF (SHEATH) IMPLANT
KIT PV (KITS) ×1 IMPLANT
SHEATH PROBE COVER 6X72 (BAG) IMPLANT
TRAY PV CATH (CUSTOM PROCEDURE TRAY) ×1 IMPLANT

## 2023-10-23 NOTE — Op Note (Signed)
 DATE OF SERVICE: 10/23/2023  PATIENT:  Timothy Holloway  68 y.o. male  PRE-OPERATIVE DIAGNOSIS:  end-stage renal disease  POST-OPERATIVE DIAGNOSIS:  Same  PROCEDURE:   1) Ultrasound guided right internal jugular vein access (CPT [phone]) 2) placement of tunneled dialysis catheter (CPT [phone]) 3) conscious sedation (14 minutes) (CPT 99152) 4) established outpatient evaluation and management - level 3 (CPT 99213)  SURGEON:  Debby SAILOR. Magda, MD  ASSISTANT: none  ANESTHESIA:   local and IV sedation  ESTIMATED BLOOD LOSS: min  LOCAL MEDICATIONS USED:  LIDOCAINE    COUNTS: confirmed correct.  PATIENT DISPOSITION:  PACU - hemodynamically stable.   Delay start of Pharmacological VTE agent (>24hrs) due to surgical blood loss or risk of bleeding: no  INDICATION FOR PROCEDURE: Timothy Holloway is a 68 y.o. male with ESRD on HD. His TDC fell out over the weekend. After careful discussion of risks, benefits, and alternatives the patient was offered placement of new TDC. The patient understood and wished to proceed.  OPERATIVE FINDINGS:  Placement of TDC under fluoroscopic and ultrasound guidance.   DESCRIPTION OF PROCEDURE: After identification of the patient in the pre-operative holding area, the patient was transferred to the operating room. The patient was positioned supine on the operating room table.  The right neck and chest were prepped and draped in standard fashion. A surgical pause was performed confirming correct patient, procedure, and operative location.  Using ultrasound guidance the right internal jugular vein was accessed with micropuncture technique.  Through the micropuncture sheath a floppy J-wire was advanced into the superior vena cava.  A small incision was made around the skin access point.  The access point was serially dilated under direct fluoroscopic guidance.  A peel-away sheath was introduced into the superior vena cava under fluoroscopic guidance.  A  counterincision was made in the chest under the clavicle.  A 19 cm tunnel dialysis catheter was then tunneled under the skin, over the clavicle into the incision in the neck.  The tunneling device was removed and the catheter fed through the peel-away sheath into the superior vena cava.  The peel-away sheath was removed and the catheter gently pulled back.  Adequate position was confirmed with x-ray.  The catheter was tested and found to flush and draw back well.  Catheter was heparin  locked.  Caps were applied.  Catheter was sutured to the skin.  The neck incision was closed with 4-0 Monocryl.  Conscious sedation was administered with the use of IV fentanyl  and midazolam  under continuous physician and nurse monitoring.  Heart rate, blood pressure, and oxygen saturation were continuously monitored.  Total sedation time was 14 minutes  Upon completion of the case instrument and sharps counts were confirmed correct. The patient was transferred to the PACU in good condition. I was present for all portions of the procedure.  PLAN: OK to use TDC. Follow up PRN.  Debby SAILOR. Magda, MD [facility] Phone Number: [phone] 10/23/2023 9:44 AM

## 2023-10-23 NOTE — H&P (Signed)
 [facility] [place]  ASSESSMENT / PLAN: 68 y.o. male with ESRD in need of dialysis access. Plan TDC today.   CHIEF COMPLAINT: ESRD, catheter fell out  HISTORY OF PRESENT ILLNESS: Timothy Holloway is a 68 y.o. male with end-stage renal disease is previously dialyzing through a tunneled dialysis catheter.  This fell out over the weekend.  He needs replacement.  He usually dialyzes Monday, Wednesday, and Friday.  We reviewed the specifics of the procedure.  Past Medical History:  Diagnosis Date   Anemia    Cataract    Chronic kidney disease (CKD), stage III (moderate) (HCC)    dr Faythe   Depression    Diabetes mellitus without complication (HCC)    DVT (deep venous thrombosis) (HCC)    as a child   ESRD on dialysis [facility])    M-W-F   Hyperlipidemia    Hypertension    Impotence    Insomnia    Obesity    Steal syndrome as complication of dialysis access [facility])     Past Surgical History:  Procedure Laterality Date   A/V FISTULAGRAM Right 10/11/2022   Procedure: A/V Fistulagram;  Surgeon: Serene Gaile ORN, MD;  [facility]: [facility] INVASIVE CV LAB;  Service: Cardiovascular;  Laterality: Right;   AV FISTULA PLACEMENT Left 01/11/2022   Procedure: LEFT ARM ARTERIOVENOUS (AV) FISTULA CREATION;  Surgeon: Lanis Fonda BRAVO, MD;  Location: [facility] OR;  Service: Vascular;  Laterality: Left;  PERIPHERAL NERVE BLOCK   AV FISTULA PLACEMENT Right 07/26/2022   Procedure: RIGHT UPPER EXTREMITY ARTERIOVENOUS (AV) FISTULA CREATION;  Surgeon: Oris Krystal FALCON, MD;  Location: [facility] ORS;  Service: Vascular;  Laterality: Right;   INSERTION OF DIALYSIS CATHETER Right 06/16/2022   Procedure: INSERTION OF RIGHT iNTERNAL Jugular TUNNELED DIALYSIS CATHETER;  Surgeon: Serene Gaile ORN, MD;  Location: [facility] OR;  Service: Vascular;  Laterality: Right;   KNEE SURGERY Left 01-31-2002   LIGATION OF ARTERIOVENOUS  FISTULA Left 06/16/2022   Procedure: LEFT ARM FISTULA LIGATION;  Surgeon: Serene Gaile ORN, MD;  Location:  [facility] OR;  Service: Vascular;  Laterality: Left;   LIGATION OF ARTERIOVENOUS  FISTULA Right 11/08/2022   Procedure: LIGATION of RIGHT RADIOCEPHALIC FISTULA;  Surgeon: Sheree Penne Bruckner, MD;  Location: [facility] OR;  Service: Vascular;  Laterality: Right;   SPINE SURGERY     fusion of L4-L5   UPPER EXTREMITY ANGIOGRAPHY Right 11/03/2022   Procedure: Upper Extremity Angiography;  Surgeon: Gretta Bruckner PARAS, MD;  Location: [facility] INVASIVE CV LAB;  Service: Cardiovascular;  Laterality: Right;    Family History  Problem Relation Age of Onset   Diabetes Mother    Cancer Father        lymphoma    Social History   Socioeconomic History   Marital status: Married    Spouse name: Not on file   Number of children: Not on file   Years of education: Not on file   Highest education level: Not on file  Occupational History   Not on file  Tobacco Use   Smoking status: Never    Passive exposure: Never   Smokeless tobacco: Not on file  Vaping Use   Vaping status: Never Used  Substance and Sexual Activity   Alcohol use: No   Drug use: No   Sexual activity: Yes    Comment: not much  Other Topics Concern   Not on file  Social History Narrative   Not on file   Social Drivers of Health  Financial Resource Strain: Low Risk  (09/14/2023)   Received from [facility]   Overall Financial Resource Strain (CARDIA)    How hard is it for you to pay for the very basics like food, housing, medical care, and heating?: Not hard at all  Food Insecurity: No Food Insecurity (09/14/2023)   Received from [facility]   Hunger Vital Sign    Within the past 12 months, you worried that your food would run out before you got the money to buy more.: Never true    Within the past 12 months, the food you bought just didn't last and you didn't have money to get more.: Never true  Transportation Needs: No Transportation Needs (09/14/2023)   Received from [facility] - Transportation    In the past 12  months, has lack of transportation kept you from medical appointments or from getting medications?: No    In the past 12 months, has lack of transportation kept you from meetings, work, or from getting things needed for daily living?: No  Physical Activity: Not on file  Stress: Not on file  Social Connections: Unknown (06/03/2021)   Received from [facility]   Social Network    Social Network: Not on file  Intimate Partner Violence: Unknown (05/04/2021)   Received from [facility]   HITS    Physically Hurt: Not on file    Insult or Talk Down To: Not on file    Threaten Physical Harm: Not on file    Scream or Curse: Not on file    Allergies  Allergen Reactions   Latex Hives   Lodine [Etodolac] Nausea And Vomiting    No current facility-administered medications for this encounter.    PHYSICAL EXAM Vitals:   10/23/23 0816 10/23/23 0836  BP: (!) 166/61   Pulse: (!) 58 (!) 58  Resp: 12 16  Temp: 98.3 F (36.8 C)   TempSrc: Oral   SpO2: 96%    Elderly gentleman in no distress Regular rate and rhythm Unlabored breathing Old catheter exit site appears clean and dry without evidence of infection.  PERTINENT LABORATORY AND RADIOLOGIC DATA  Most recent CBC    Latest Ref Rng & Units 10/21/2023   11:34 AM 08/21/2023   11:30 AM 11/11/2022   11:45 AM  CBC  WBC 4.0 - 10.5 K/uL 8.8  8.6  8.8   Hemoglobin 13.0 - 17.0 g/dL 89.3  89.4  9.4   Hematocrit 39.0 - 52.0 % 33.3  33.4  31.1   Platelets 150 - 400 K/uL 216  234  290      Most recent CMP    Latest Ref Rng & Units 10/21/2023   11:34 AM 08/21/2023   11:30 AM 11/11/2022   11:45 AM  CMP  Glucose 70 - 99 mg/dL 840  800  686   BUN 8 - 23 mg/dL 41  63  62   Creatinine 0.61 - 1.24 mg/dL 4.96  4.54  4.57   Sodium 135 - 145 mmol/L 138  137  135   Potassium 3.5 - 5.1 mmol/L 4.0  4.5  3.9   Chloride 98 - 111 mmol/L 98  102  99   CO2 22 - 32 mmol/L 24  22  23    Calcium  8.9 - 10.3 mg/dL 9.3  8.4  8.6   Total Protein 6.5 - 8.1  g/dL 6.9   6.5   Total Bilirubin 0.0 - 1.2 mg/dL 0.7   0.5  Alkaline Phos 38 - 126 U/L 56   59   AST 15 - 41 U/L 17   15   ALT 0 - 44 U/L 15   8     Renal function Estimated Creatinine Clearance: 19.2 mL/min (A) (by C-G formula based on SCr of 5.03 mg/dL (H)).  Hgb A1c MFr Bld (%)  Date Value  07/22/2021 8.0 (H)    LDL (calc)  Date Value Ref Range Status  08/06/2012 48 <100 mg/dL Final    Comment:    LDL-C is inaccurate if patient is nonfasting.   Reference Range: ---------------- Optimal:            <100 Near/Above Optimal: 100-129 Borderline High:    130-159 High:               160-189 Very High:          >=190       LDLC SERPL CALC-MCNC  Date Value Ref Range Status  11/09/2012 35 <100 mg/dL Final    Comment:    LDL-C is inaccurate if patient is nonfasting.                           Optimal               <  100                           Above optimal     100 -  129                           Borderline        130 -  159                           High              160 -  189                           Very high             >  189    Vikkie Goeden N. Magda, MD FACS [facility] Phone Number: [phone] 10/23/2023 9:00 AM   Total time spent on preparing this encounter including chart review, data review, collecting history, examining the patient, and coordinating care: 30 min  Portions of this report may have been transcribed using voice recognition software.  Every effort has been made to ensure accuracy; however, inadvertent computerized transcription errors may still be present.

## 2023-10-24 ENCOUNTER — Encounter (HOSPITAL_COMMUNITY): Payer: Self-pay | Admitting: Vascular Surgery

## 2024-01-05 ENCOUNTER — Inpatient Hospital Stay (HOSPITAL_COMMUNITY)
Admission: EM | Admit: 2024-01-05 | Discharge: 2024-01-18 | DRG: 233 | Disposition: A | Discharge status: Rehabilitation Facility | Attending: Family Medicine | Admitting: Family Medicine

## 2024-01-05 ENCOUNTER — Emergency Department (HOSPITAL_COMMUNITY)

## 2024-01-05 ENCOUNTER — Inpatient Hospital Stay (HOSPITAL_COMMUNITY)

## 2024-01-05 ENCOUNTER — Encounter (HOSPITAL_COMMUNITY): Payer: Self-pay | Admitting: Emergency Medicine

## 2024-01-05 ENCOUNTER — Encounter (HOSPITAL_COMMUNITY)
Admission: EM | Disposition: A | Discharge status: Rehabilitation Facility | Payer: Self-pay | Source: Home / Self Care | Attending: Family Medicine

## 2024-01-05 DIAGNOSIS — I509 Heart failure, unspecified: Secondary | ICD-10-CM | POA: Diagnosis not present

## 2024-01-05 DIAGNOSIS — I5021 Acute systolic (congestive) heart failure: Secondary | ICD-10-CM | POA: Diagnosis not present

## 2024-01-05 DIAGNOSIS — R29715 NIHSS score 15: Secondary | ICD-10-CM | POA: Diagnosis not present

## 2024-01-05 DIAGNOSIS — I2510 Atherosclerotic heart disease of native coronary artery without angina pectoris: Secondary | ICD-10-CM

## 2024-01-05 DIAGNOSIS — J9811 Atelectasis: Secondary | ICD-10-CM | POA: Diagnosis not present

## 2024-01-05 DIAGNOSIS — Z7902 Long term (current) use of antithrombotics/antiplatelets: Secondary | ICD-10-CM | POA: Diagnosis not present

## 2024-01-05 DIAGNOSIS — G9340 Encephalopathy, unspecified: Secondary | ICD-10-CM | POA: Diagnosis not present

## 2024-01-05 DIAGNOSIS — F32A Depression, unspecified: Secondary | ICD-10-CM | POA: Diagnosis present

## 2024-01-05 DIAGNOSIS — G928 Other toxic encephalopathy: Secondary | ICD-10-CM | POA: Diagnosis not present

## 2024-01-05 DIAGNOSIS — R578 Other shock: Secondary | ICD-10-CM | POA: Diagnosis not present

## 2024-01-05 DIAGNOSIS — Z6841 Body Mass Index (BMI) 40.0 and over, adult: Secondary | ICD-10-CM | POA: Diagnosis not present

## 2024-01-05 DIAGNOSIS — I959 Hypotension, unspecified: Secondary | ICD-10-CM | POA: Diagnosis not present

## 2024-01-05 DIAGNOSIS — R739 Hyperglycemia, unspecified: Secondary | ICD-10-CM | POA: Diagnosis not present

## 2024-01-05 DIAGNOSIS — E872 Acidosis, unspecified: Secondary | ICD-10-CM | POA: Diagnosis not present

## 2024-01-05 DIAGNOSIS — Z951 Presence of aortocoronary bypass graft: Secondary | ICD-10-CM

## 2024-01-05 DIAGNOSIS — R4781 Slurred speech: Secondary | ICD-10-CM | POA: Diagnosis not present

## 2024-01-05 DIAGNOSIS — I10 Essential (primary) hypertension: Secondary | ICD-10-CM | POA: Diagnosis present

## 2024-01-05 DIAGNOSIS — E1159 Type 2 diabetes mellitus with other circulatory complications: Secondary | ICD-10-CM | POA: Diagnosis not present

## 2024-01-05 DIAGNOSIS — R29701 NIHSS score 1: Secondary | ICD-10-CM | POA: Diagnosis not present

## 2024-01-05 DIAGNOSIS — N186 End stage renal disease: Secondary | ICD-10-CM

## 2024-01-05 DIAGNOSIS — L89152 Pressure ulcer of sacral region, stage 2: Secondary | ICD-10-CM | POA: Diagnosis not present

## 2024-01-05 DIAGNOSIS — G9389 Other specified disorders of brain: Secondary | ICD-10-CM | POA: Diagnosis present

## 2024-01-05 DIAGNOSIS — Z7982 Long term (current) use of aspirin: Secondary | ICD-10-CM | POA: Diagnosis not present

## 2024-01-05 DIAGNOSIS — I63512 Cerebral infarction due to unspecified occlusion or stenosis of left middle cerebral artery: Secondary | ICD-10-CM | POA: Diagnosis not present

## 2024-01-05 DIAGNOSIS — R079 Chest pain, unspecified: Secondary | ICD-10-CM | POA: Diagnosis present

## 2024-01-05 DIAGNOSIS — D72829 Elevated white blood cell count, unspecified: Secondary | ICD-10-CM | POA: Diagnosis not present

## 2024-01-05 DIAGNOSIS — I779 Disorder of arteries and arterioles, unspecified: Secondary | ICD-10-CM | POA: Diagnosis not present

## 2024-01-05 DIAGNOSIS — J811 Chronic pulmonary edema: Secondary | ICD-10-CM | POA: Diagnosis not present

## 2024-01-05 DIAGNOSIS — E119 Type 2 diabetes mellitus without complications: Secondary | ICD-10-CM

## 2024-01-05 DIAGNOSIS — I5022 Chronic systolic (congestive) heart failure: Secondary | ICD-10-CM | POA: Diagnosis present

## 2024-01-05 DIAGNOSIS — E1165 Type 2 diabetes mellitus with hyperglycemia: Secondary | ICD-10-CM | POA: Diagnosis not present

## 2024-01-05 DIAGNOSIS — Z794 Long term (current) use of insulin: Secondary | ICD-10-CM | POA: Diagnosis not present

## 2024-01-05 DIAGNOSIS — E1122 Type 2 diabetes mellitus with diabetic chronic kidney disease: Secondary | ICD-10-CM | POA: Diagnosis present

## 2024-01-05 DIAGNOSIS — E871 Hypo-osmolality and hyponatremia: Secondary | ICD-10-CM | POA: Diagnosis not present

## 2024-01-05 DIAGNOSIS — E785 Hyperlipidemia, unspecified: Secondary | ICD-10-CM | POA: Diagnosis present

## 2024-01-05 DIAGNOSIS — D631 Anemia in chronic kidney disease: Secondary | ICD-10-CM | POA: Diagnosis present

## 2024-01-05 DIAGNOSIS — I951 Orthostatic hypotension: Secondary | ICD-10-CM | POA: Diagnosis not present

## 2024-01-05 DIAGNOSIS — G9349 Other encephalopathy: Secondary | ICD-10-CM | POA: Diagnosis not present

## 2024-01-05 DIAGNOSIS — R569 Unspecified convulsions: Secondary | ICD-10-CM | POA: Diagnosis not present

## 2024-01-05 DIAGNOSIS — G8191 Hemiplegia, unspecified affecting right dominant side: Secondary | ICD-10-CM | POA: Diagnosis not present

## 2024-01-05 DIAGNOSIS — I6340 Cerebral infarction due to embolism of unspecified cerebral artery: Secondary | ICD-10-CM | POA: Diagnosis not present

## 2024-01-05 DIAGNOSIS — G4700 Insomnia, unspecified: Secondary | ICD-10-CM | POA: Diagnosis present

## 2024-01-05 DIAGNOSIS — K5900 Constipation, unspecified: Secondary | ICD-10-CM | POA: Diagnosis not present

## 2024-01-05 DIAGNOSIS — D696 Thrombocytopenia, unspecified: Secondary | ICD-10-CM | POA: Diagnosis not present

## 2024-01-05 DIAGNOSIS — G459 Transient cerebral ischemic attack, unspecified: Secondary | ICD-10-CM | POA: Diagnosis not present

## 2024-01-05 DIAGNOSIS — I639 Cerebral infarction, unspecified: Secondary | ICD-10-CM | POA: Diagnosis not present

## 2024-01-05 DIAGNOSIS — D649 Anemia, unspecified: Secondary | ICD-10-CM | POA: Diagnosis not present

## 2024-01-05 DIAGNOSIS — I110 Hypertensive heart disease with heart failure: Secondary | ICD-10-CM | POA: Diagnosis not present

## 2024-01-05 DIAGNOSIS — Z992 Dependence on renal dialysis: Secondary | ICD-10-CM | POA: Diagnosis not present

## 2024-01-05 DIAGNOSIS — Z743 Need for continuous supervision: Secondary | ICD-10-CM | POA: Diagnosis not present

## 2024-01-05 DIAGNOSIS — I69391 Dysphagia following cerebral infarction: Secondary | ICD-10-CM | POA: Diagnosis not present

## 2024-01-05 DIAGNOSIS — I953 Hypotension of hemodialysis: Secondary | ICD-10-CM | POA: Diagnosis not present

## 2024-01-05 DIAGNOSIS — I63412 Cerebral infarction due to embolism of left middle cerebral artery: Secondary | ICD-10-CM | POA: Diagnosis not present

## 2024-01-05 DIAGNOSIS — I5020 Unspecified systolic (congestive) heart failure: Secondary | ICD-10-CM | POA: Diagnosis not present

## 2024-01-05 DIAGNOSIS — D62 Acute posthemorrhagic anemia: Secondary | ICD-10-CM | POA: Diagnosis not present

## 2024-01-05 DIAGNOSIS — E1169 Type 2 diabetes mellitus with other specified complication: Secondary | ICD-10-CM | POA: Diagnosis not present

## 2024-01-05 DIAGNOSIS — S31000S Unspecified open wound of lower back and pelvis without penetration into retroperitoneum, sequela: Secondary | ICD-10-CM | POA: Diagnosis not present

## 2024-01-05 DIAGNOSIS — Z9911 Dependence on respirator [ventilator] status: Secondary | ICD-10-CM

## 2024-01-05 DIAGNOSIS — E66813 Obesity, class 3: Secondary | ICD-10-CM | POA: Diagnosis present

## 2024-01-05 DIAGNOSIS — I214 Non-ST elevation (NSTEMI) myocardial infarction: Secondary | ICD-10-CM | POA: Diagnosis present

## 2024-01-05 DIAGNOSIS — I6310 Cerebral infarction due to embolism of unspecified precerebral artery: Secondary | ICD-10-CM | POA: Diagnosis not present

## 2024-01-05 DIAGNOSIS — I120 Hypertensive chronic kidney disease with stage 5 chronic kidney disease or end stage renal disease: Secondary | ICD-10-CM | POA: Diagnosis not present

## 2024-01-05 DIAGNOSIS — N2581 Secondary hyperparathyroidism of renal origin: Secondary | ICD-10-CM | POA: Diagnosis present

## 2024-01-05 DIAGNOSIS — E46 Unspecified protein-calorie malnutrition: Secondary | ICD-10-CM | POA: Diagnosis not present

## 2024-01-05 DIAGNOSIS — I132 Hypertensive heart and chronic kidney disease with heart failure and with stage 5 chronic kidney disease, or end stage renal disease: Secondary | ICD-10-CM | POA: Diagnosis present

## 2024-01-05 DIAGNOSIS — I6522 Occlusion and stenosis of left carotid artery: Secondary | ICD-10-CM | POA: Diagnosis present

## 2024-01-05 DIAGNOSIS — R29818 Other symptoms and signs involving the nervous system: Secondary | ICD-10-CM | POA: Diagnosis not present

## 2024-01-05 DIAGNOSIS — E11319 Type 2 diabetes mellitus with unspecified diabetic retinopathy without macular edema: Secondary | ICD-10-CM | POA: Diagnosis present

## 2024-01-05 HISTORY — PX: LEFT HEART CATH AND CORONARY ANGIOGRAPHY: CATH118249

## 2024-01-05 LAB — IRON AND TIBC
Iron: 36 ug/dL — ABNORMAL LOW (ref 45–182)
Saturation Ratios: 12 % — ABNORMAL LOW (ref 17.9–39.5)
TIBC: 298 ug/dL (ref 250–450)
UIBC: 262 ug/dL

## 2024-01-05 LAB — HEPATITIS B SURFACE ANTIGEN: Hepatitis B Surface Ag: NONREACTIVE

## 2024-01-05 LAB — VITAMIN B12: Vitamin B-12: 819 pg/mL (ref 180–914)

## 2024-01-05 LAB — TROPONIN T, HIGH SENSITIVITY
Troponin T High Sensitivity: 1121 ng/L (ref 0–19)
Troponin T High Sensitivity: 1343 ng/L (ref 0–19)

## 2024-01-05 LAB — BASIC METABOLIC PANEL WITH GFR
Anion gap: 11 (ref 5–15)
BUN: 48 mg/dL — ABNORMAL HIGH (ref 8–23)
CO2: 26 mmol/L (ref 22–32)
Calcium: 8.9 mg/dL (ref 8.9–10.3)
Chloride: 100 mmol/L (ref 98–111)
Creatinine, Ser: 5.29 mg/dL — ABNORMAL HIGH (ref 0.61–1.24)
GFR, Estimated: 11 mL/min — ABNORMAL LOW (ref >60)
Glucose, Bld: 306 mg/dL — ABNORMAL HIGH (ref 70–99)
Potassium: 4.2 mmol/L (ref 3.5–5.1)
Sodium: 136 mmol/L (ref 135–145)

## 2024-01-05 LAB — GLUCOSE, CAPILLARY: Glucose-Capillary: 207 mg/dL — ABNORMAL HIGH (ref 70–99)

## 2024-01-05 LAB — CBC
HCT: 30 % — ABNORMAL LOW (ref 39.0–52.0)
Hemoglobin: 9.6 g/dL — ABNORMAL LOW (ref 13.0–17.0)
MCH: 30.9 pg (ref 26.0–34.0)
MCHC: 32 g/dL (ref 30.0–36.0)
MCV: 96.5 fL (ref 80.0–100.0)
Platelets: 253 K/uL (ref 150–400)
RBC: 3.11 MIL/uL — ABNORMAL LOW (ref 4.22–5.81)
RDW: 14.5 % (ref 11.5–15.5)
WBC: 12.2 K/uL — ABNORMAL HIGH (ref 4.0–10.5)
nRBC: 0 % (ref 0.0–0.2)

## 2024-01-05 LAB — ECHOCARDIOGRAM COMPLETE
Height: 72 in
S' Lateral: 4.4 cm
Weight: 4640 [oz_av]

## 2024-01-05 LAB — RETICULOCYTES
Immature Retic Fract: 32.3 % — ABNORMAL HIGH (ref 2.3–15.9)
RBC.: 3.12 MIL/uL — ABNORMAL LOW (ref 4.22–5.81)
Retic Count, Absolute: 54.8 K/uL (ref 19.0–186.0)
Retic Ct Pct: 1.7 % (ref 0.4–3.1)

## 2024-01-05 LAB — FOLATE: Folate: 11.1 ng/mL (ref >5.9)

## 2024-01-05 LAB — HEMOGLOBIN A1C
Hgb A1c MFr Bld: 9.9 % — ABNORMAL HIGH (ref 4.8–5.6)
Mean Plasma Glucose: 237.43 mg/dL

## 2024-01-05 LAB — TSH: TSH: 2.19 u[IU]/mL (ref 0.350–4.500)

## 2024-01-05 LAB — FERRITIN: Ferritin: 1105 ng/mL — ABNORMAL HIGH (ref 24–336)

## 2024-01-05 SURGERY — LEFT HEART CATH AND CORONARY ANGIOGRAPHY
Anesthesia: LOCAL

## 2024-01-05 MED ORDER — ACETAMINOPHEN 650 MG RE SUPP: 650.0000 mg | Freq: Four times a day (QID) | RECTAL | Status: DC | PRN

## 2024-01-05 MED ORDER — HYDROCHLOROTHIAZIDE 25 MG PO TABS
25.0000 mg | ORAL_TABLET | Freq: Every day | ORAL | Status: DC
  Administered 2024-01-06 – 2024-01-07 (×2): 25 mg via ORAL
  Filled 2024-01-05 (×2): qty 1

## 2024-01-05 MED ORDER — HEPARIN SODIUM (PORCINE) 1000 UNIT/ML IJ SOLN
4000.0000 [IU] | Freq: Once | INTRAMUSCULAR | Status: AC
  Administered 2024-01-06: 4000 [IU]

## 2024-01-05 MED ORDER — LOSARTAN POTASSIUM 50 MG PO TABS
100.0000 mg | ORAL_TABLET | Freq: Every day | ORAL | Status: DC
  Administered 2024-01-06: 100 mg via ORAL
  Filled 2024-01-05: qty 2

## 2024-01-05 MED ORDER — INSULIN ASPART 100 UNIT/ML IJ SOLN: 0.0000 [IU] | INTRAMUSCULAR | Status: DC

## 2024-01-05 MED ORDER — HEPARIN SODIUM (PORCINE) 5000 UNIT/ML IJ SOLN
4000.0000 [IU] | Freq: Once | INTRAMUSCULAR | Status: AC
  Administered 2024-01-05: 4000 [IU] via INTRAVENOUS
  Filled 2024-01-05: qty 1

## 2024-01-05 MED ORDER — ZOLPIDEM TARTRATE 5 MG PO TABS
5.0000 mg | ORAL_TABLET | Freq: Every evening | ORAL | Status: DC | PRN
  Administered 2024-01-07 – 2024-01-15 (×3): 5 mg via ORAL
  Filled 2024-01-05 (×3): qty 1

## 2024-01-05 MED ORDER — ALBUTEROL SULFATE (2.5 MG/3ML) 0.083% IN NEBU: 2.5000 mg | INHALATION_SOLUTION | RESPIRATORY_TRACT | Status: DC | PRN

## 2024-01-05 MED ORDER — SODIUM CHLORIDE 0.9% FLUSH
3.0000 mL | Freq: Two times a day (BID) | INTRAVENOUS | Status: DC
  Administered 2024-01-06 – 2024-01-08 (×5): 3 mL via INTRAVENOUS

## 2024-01-05 MED ORDER — MIDAZOLAM HCL 2 MG/2ML IJ SOLN
INTRAMUSCULAR | Status: AC
  Filled 2024-01-05: qty 2

## 2024-01-05 MED ORDER — LIDOCAINE HCL (PF) 1 % IJ SOLN
INTRAMUSCULAR | Status: DC | PRN
  Administered 2024-01-05: 5 mL via INTRADERMAL

## 2024-01-05 MED ORDER — FENOFIBRATE 54 MG PO TABS
54.0000 mg | ORAL_TABLET | Freq: Every day | ORAL | Status: DC
  Administered 2024-01-06 – 2024-01-07 (×2): 54 mg via ORAL
  Filled 2024-01-05 (×6): qty 1

## 2024-01-05 MED ORDER — MIDAZOLAM HCL (PF) 2 MG/2ML IJ SOLN
INTRAMUSCULAR | Status: DC | PRN
  Administered 2024-01-05: 1 mg via INTRAVENOUS

## 2024-01-05 MED ORDER — ISOSORBIDE MONONITRATE ER 30 MG PO TB24
30.0000 mg | ORAL_TABLET | Freq: Every day | ORAL | Status: DC
  Administered 2024-01-06 – 2024-01-07 (×2): 30 mg via ORAL
  Filled 2024-01-05 (×3): qty 1

## 2024-01-05 MED ORDER — OXYCODONE HCL 5 MG PO TABS
5.0000 mg | ORAL_TABLET | Freq: Four times a day (QID) | ORAL | Status: DC | PRN
  Administered 2024-01-07: 5 mg via ORAL
  Filled 2024-01-05: qty 1

## 2024-01-05 MED ORDER — HYDRALAZINE HCL 50 MG PO TABS
100.0000 mg | ORAL_TABLET | Freq: Two times a day (BID) | ORAL | Status: DC
  Administered 2024-01-06 – 2024-01-07 (×4): 100 mg via ORAL
  Filled 2024-01-05 (×4): qty 2

## 2024-01-05 MED ORDER — ACETAMINOPHEN 325 MG PO TABS: 650.0000 mg | ORAL_TABLET | Freq: Four times a day (QID) | ORAL | Status: DC | PRN

## 2024-01-05 MED ORDER — BISACODYL 5 MG PO TBEC: 5.0000 mg | DELAYED_RELEASE_TABLET | Freq: Every day | ORAL | Status: DC | PRN

## 2024-01-05 MED ORDER — CLONIDINE HCL 0.1 MG PO TABS
0.1000 mg | ORAL_TABLET | Freq: Two times a day (BID) | ORAL | Status: DC
  Administered 2024-01-06 – 2024-01-07 (×4): 0.1 mg via ORAL
  Filled 2024-01-05 (×5): qty 1

## 2024-01-05 MED ORDER — IOHEXOL 350 MG/ML SOLN
INTRAVENOUS | Status: DC | PRN
  Administered 2024-01-05: 70 mL

## 2024-01-05 MED ORDER — HYDRALAZINE HCL 50 MG PO TABS: 100.0000 mg | ORAL_TABLET | Freq: Two times a day (BID) | ORAL | Status: DC

## 2024-01-05 MED ORDER — FENTANYL CITRATE (PF) 100 MCG/2ML IJ SOLN
INTRAMUSCULAR | Status: AC
  Filled 2024-01-05: qty 2

## 2024-01-05 MED ORDER — SUCROFERRIC OXYHYDROXIDE 500 MG PO CHEW
500.0000 mg | CHEWABLE_TABLET | Freq: Three times a day (TID) | ORAL | Status: DC
  Administered 2024-01-06 – 2024-01-09 (×9): 500 mg via ORAL
  Filled 2024-01-05 (×14): qty 1

## 2024-01-05 MED ORDER — SODIUM CHLORIDE 0.9% FLUSH: 3.0000 mL | INTRAVENOUS | Status: DC | PRN

## 2024-01-05 MED ORDER — HEPARIN SODIUM (PORCINE) 1000 UNIT/ML IJ SOLN
INTRAMUSCULAR | Status: AC
  Filled 2024-01-05: qty 4

## 2024-01-05 MED ORDER — ONDANSETRON HCL 4 MG PO TABS: 4.0000 mg | ORAL_TABLET | Freq: Four times a day (QID) | ORAL | Status: DC | PRN

## 2024-01-05 MED ORDER — HYDRALAZINE HCL 20 MG/ML IJ SOLN: 10.0000 mg | INTRAMUSCULAR | Status: AC | PRN

## 2024-01-05 MED ORDER — ATORVASTATIN CALCIUM 80 MG PO TABS
80.0000 mg | ORAL_TABLET | Freq: Every day | ORAL | Status: DC
  Administered 2024-01-05 – 2024-01-09 (×4): 80 mg via ORAL
  Filled 2024-01-05 (×6): qty 1

## 2024-01-05 MED ORDER — CHLORHEXIDINE GLUCONATE CLOTH 2 % EX PADS
6.0000 | MEDICATED_PAD | Freq: Every day | CUTANEOUS | Status: DC
  Administered 2024-01-06 – 2024-01-08 (×3): 6 via TOPICAL

## 2024-01-05 MED ORDER — HEPARIN (PORCINE) IN NACL 1000-0.9 UT/500ML-% IV SOLN
INTRAVENOUS | Status: DC | PRN
  Administered 2024-01-05: 1000 mL

## 2024-01-05 MED ORDER — INSULIN ASPART 100 UNIT/ML IJ SOLN
0.0000 [IU] | Freq: Three times a day (TID) | INTRAMUSCULAR | Status: DC
  Administered 2024-01-06: 4 [IU] via SUBCUTANEOUS
  Administered 2024-01-06: 3 [IU] via SUBCUTANEOUS
  Administered 2024-01-06: 5 [IU] via SUBCUTANEOUS
  Administered 2024-01-07: 1 [IU] via SUBCUTANEOUS
  Administered 2024-01-07: 5 [IU] via SUBCUTANEOUS
  Administered 2024-01-07: 4 [IU] via SUBCUTANEOUS
  Filled 2024-01-05: qty 1
  Filled 2024-01-05: qty 4
  Filled 2024-01-05: qty 5
  Filled 2024-01-05: qty 3
  Filled 2024-01-05: qty 5

## 2024-01-05 MED ORDER — HEPARIN (PORCINE) 25000 UT/250ML-% IV SOLN
1500.0000 [IU]/h | INTRAVENOUS | Status: DC
  Administered 2024-01-05: 1500 [IU]/h via INTRAVENOUS
  Filled 2024-01-05: qty 250

## 2024-01-05 MED ORDER — ASPIRIN 81 MG PO TBEC
81.0000 mg | DELAYED_RELEASE_TABLET | Freq: Every day | ORAL | Status: DC
  Administered 2024-01-06 – 2024-01-07 (×2): 81 mg via ORAL
  Filled 2024-01-05 (×2): qty 1

## 2024-01-05 MED ORDER — INSULIN GLARGINE 100 UNIT/ML ~~LOC~~ SOLN
40.0000 [IU] | Freq: Every day | SUBCUTANEOUS | Status: DC
  Administered 2024-01-06 – 2024-01-07 (×2): 40 [IU] via SUBCUTANEOUS
  Filled 2024-01-05 (×4): qty 0.4

## 2024-01-05 MED ORDER — FENTANYL CITRATE (PF) 50 MCG/ML IJ SOSY: 12.5000 ug | PREFILLED_SYRINGE | INTRAMUSCULAR | Status: DC | PRN

## 2024-01-05 MED ORDER — LIDOCAINE HCL (PF) 1 % IJ SOLN
INTRAMUSCULAR | Status: AC
  Filled 2024-01-05: qty 30

## 2024-01-05 MED ORDER — FENTANYL CITRATE (PF) 100 MCG/2ML IJ SOLN: 12.5000 ug | INTRAMUSCULAR | Status: DC | PRN

## 2024-01-05 MED ORDER — ONDANSETRON HCL 4 MG/2ML IJ SOLN: 4.0000 mg | Freq: Four times a day (QID) | INTRAMUSCULAR | Status: DC | PRN

## 2024-01-05 MED ORDER — CARVEDILOL 6.25 MG PO TABS
6.2500 mg | ORAL_TABLET | Freq: Two times a day (BID) | ORAL | Status: DC
  Administered 2024-01-06 – 2024-01-07 (×3): 6.25 mg via ORAL
  Filled 2024-01-05 (×3): qty 1

## 2024-01-05 MED ORDER — HEPARIN (PORCINE) 25000 UT/250ML-% IV SOLN
2400.0000 [IU]/h | INTRAVENOUS | Status: DC
  Administered 2024-01-06: 1800 [IU]/h via INTRAVENOUS
  Administered 2024-01-06: 1500 [IU]/h via INTRAVENOUS
  Administered 2024-01-07: 2100 [IU]/h via INTRAVENOUS
  Administered 2024-01-07 – 2024-01-08 (×2): 2400 [IU]/h via INTRAVENOUS
  Filled 2024-01-05 (×5): qty 250

## 2024-01-05 MED ORDER — FENTANYL CITRATE (PF) 100 MCG/2ML IJ SOLN
INTRAMUSCULAR | Status: DC | PRN
  Administered 2024-01-05: 25 ug via INTRAVENOUS

## 2024-01-05 MED ORDER — SODIUM CHLORIDE 0.9 % IV SOLN: 250.0000 mL | INTRAVENOUS | Status: AC | PRN

## 2024-01-05 MED ORDER — INSULIN GLARGINE-YFGN 100 UNIT/ML ~~LOC~~ SOLN: 40.0000 [IU] | Freq: Every day | SUBCUTANEOUS | Status: DC

## 2024-01-05 SURGICAL SUPPLY — 8 items
CATH INFINITI 5FR MULTPACK ANG (CATHETERS) IMPLANT
CLOSURE MYNX CONTROL 5F (Vascular Products) IMPLANT
KIT MICROPUNCTURE NIT STIFF (SHEATH) IMPLANT
PACK CARDIAC CATHETERIZATION (CUSTOM PROCEDURE TRAY) ×1 IMPLANT
SET ATX-X65L (MISCELLANEOUS) IMPLANT
SHEATH PINNACLE 5F 10CM (SHEATH) IMPLANT
SHEATH PROBE COVER 6X72 (BAG) IMPLANT
WIRE EMERALD 3MM-J .035X150CM (WIRE) IMPLANT

## 2024-01-05 NOTE — H&P (Addendum)
 History and Physical  [facility]  JVION TURGEON FMW:[id number] DOB: 1956-01-30 DOA: 01/05/2024  PCP: Baird Comer GAILS, NP  Patient coming from: sent from outpatient dialysis center Level of care: Telemetry  I have personally briefly reviewed patient's old medical records in [facility] Link  Chief Complaint: chest pain   HPI: Timothy Holloway is a 68 year old male with type 2 diabetes mellitus, obesity, hypertension, hyperlipidemia, depression, ESRD on dialysis (MWF) for last 1.5 years, anemia insomnia went to his dialysis treatment today and reported that he had a significant episode of chest pain/chest pressure where he said he felt like an elephant was sitting on his chest.  He was sitting and not active at the time.  He did not receive dialysis today because they decided to send him to the ED for further treatment.  Patient says that he had been having intermittent episodes of chest pressure for the last week that lasted about hour.  He had some shortness of breath with this episode.  When he arrived, he was chest pain-free.  He was noted to have a troponin of 1121 followed by 1343.  His glucose was 306.  He was started on an IV heparin  infusion.  He is on room air oxygen.  His chest x-ray showed some pulmonary edema.  His hemoglobin is 9.6.  He was seen by the inpatient cardiology service and they have recommended that he go directly to the Cath Lab today at [facility] and hospitalist called for admission.   Past Medical History:  Diagnosis Date   Anemia    Cataract    Chronic kidney disease (CKD), stage III (moderate) (HCC)    dr Faythe   Depression    Diabetes mellitus without complication (HCC)    DVT (deep venous thrombosis) (HCC)    as a child   ESRD on dialysis [facility])    M-W-F   Hyperlipidemia    Hypertension    Impotence    Insomnia    Obesity    Steal syndrome as complication of dialysis access     Past Surgical History:  Procedure Laterality Date    A/V FISTULAGRAM Right 10/11/2022   Procedure: A/V Fistulagram;  Surgeon: Serene Gaile ORN, MD;  [facility] INVASIVE CV LAB;  Service: Cardiovascular;  Laterality: Right;   AV FISTULA PLACEMENT Left 01/11/2022   Procedure: LEFT ARM ARTERIOVENOUS (AV) FISTULA CREATION;  Surgeon: Lanis Fonda BRAVO, MD;  Location: [facility] OR;  Service: Vascular;  Laterality: Left;  PERIPHERAL NERVE BLOCK   AV FISTULA PLACEMENT Right 07/26/2022   Procedure: RIGHT UPPER EXTREMITY ARTERIOVENOUS (AV) FISTULA CREATION;  Surgeon: Oris Krystal FALCON, MD;  Location: [facility] ORS;  Service: Vascular;  Laterality: Right;   DIALYSIS/PERMA CATHETER INSERTION Right 10/23/2023   Procedure: DIALYSIS/PERMA CATHETER INSERTION;  Surgeon: Magda Debby SAILOR, MD;  Location: [facility] LAB;  Service: Cardiovascular;  Laterality: Right;   INSERTION OF DIALYSIS CATHETER Right 06/16/2022   Procedure: INSERTION OF RIGHT iNTERNAL Jugular TUNNELED DIALYSIS CATHETER;  Surgeon: Serene Gaile ORN, MD;  Location: [facility] OR;  Service: Vascular;  Laterality: Right;   KNEE SURGERY Left 01-31-2002   LIGATION OF ARTERIOVENOUS  FISTULA Left 06/16/2022   Procedure: LEFT ARM FISTULA LIGATION;  Surgeon: Serene Gaile ORN, MD;  Location: [facility] OR;  Service: Vascular;  Laterality: Left;   LIGATION OF ARTERIOVENOUS  FISTULA Right 11/08/2022   Procedure: LIGATION of RIGHT RADIOCEPHALIC FISTULA;  Surgeon: Sheree Penne Bruckner, MD;  Location: [facility] OR;  Service: Vascular;  Laterality: Right;  SPINE SURGERY     fusion of L4-L5   UPPER EXTREMITY ANGIOGRAPHY Right 11/03/2022   Procedure: Upper Extremity Angiography;  Surgeon: Gretta Lonni PARAS, MD;  Location: [facility] INVASIVE CV LAB;  Service: Cardiovascular;  Laterality: Right;     reports that he has never smoked. He has never been exposed to tobacco smoke. He does not have any smokeless tobacco history on file. He reports that he does not drink alcohol and does not use drugs.  Allergies  Allergen Reactions   Latex Hives   Lodine [Etodolac]  Nausea And Vomiting    Family History  Problem Relation Age of Onset   Diabetes Mother    Cancer Father        lymphoma    Prior to Admission medications   Medication Sig Start Date End Date Taking? Authorizing Provider  acetaminophen  (TYLENOL ) 325 MG tablet Take 650 mg by mouth every 6 (six) hours as needed for moderate pain. 06/06/20  Yes [provider]  albuterol  (VENTOLIN  HFA) 108 (90 Base) MCG/ACT inhaler Inhale 2 puffs into the lungs every 6 (six) hours as needed for wheezing. 09/14/23  Yes [provider]  aspirin  81 MG tablet Take 81 mg by mouth daily.   Yes [provider]  atorvastatin  (LIPITOR) 10 MG tablet Take 1 tablet (10 mg total) by mouth daily. Patient taking differently: Take 10 mg by mouth at bedtime. 08/06/12  Yes Cyrena Gwenn SQUIBB, MD  carvedilol  (COREG ) 6.25 MG tablet Take 6.25 mg by mouth 2 (two) times daily with a meal. 09/14/23  Yes [provider]  Cholecalciferol (VITAMIN D) 2000 UNITS CAPS Take 2,000 Units by mouth at bedtime.   Yes [provider]  cloNIDine  (CATAPRES ) 0.1 MG tablet Take 0.1 mg by mouth 2 (two) times daily. 05/16/22  Yes [provider]  diclofenac Sodium (VOLTAREN) 1 % GEL Apply 2 g topically daily as needed (pain).   Yes [provider]  EPINEPHrine  0.3 mg/0.3 mL IJ SOAJ injection Inject 0.3 mg into the muscle as needed for anaphylaxis. 09/14/23  Yes [provider]  fenofibrate  (TRICOR ) 145 MG tablet Take 1/2 tablet daily Patient taking differently: Take 72.5 mg by mouth every morning. 09/06/13  Yes Georgina Nancyann ORN, MD  furosemide  (LASIX ) 80 MG tablet Take 80 mg by mouth daily. 12/28/21  Yes [provider]  Glucosamine-Chondroit-Vit C-Mn (GLUCOSAMINE CHONDR 1500 COMPLX) CAPS Take 1 capsule by mouth 2 (two) times daily.   Yes [provider]  HUMALOG KWIKPEN 200 UNIT/ML KwikPen Inject 10-20 Units into the skin with breakfast, with lunch, and with evening meal.  Sliding scale 07/19/21  Yes [provider]  hydrALAZINE  (APRESOLINE ) 100 MG tablet Take 100 mg by mouth 2 (two) times daily. 10/13/23  Yes [provider]  hydrochlorothiazide  (HYDRODIURIL ) 25 MG tablet Take 25 mg by mouth daily. 12/29/21  Yes [provider]  isosorbide  mononitrate (IMDUR ) 30 MG 24 hr tablet Take 30 mg by mouth at bedtime.   Yes [provider]  LANTUS  SOLOSTAR 100 UNIT/ML Solostar Pen Inject 50 Units into the skin at bedtime. 10/25/21  Yes [provider]  losartan  (COZAAR ) 100 MG tablet Take 100 mg by mouth at bedtime. 10/25/21  Yes [provider]  meclizine (ANTIVERT) 25 MG tablet Take 25 mg by mouth daily as needed for dizziness. 04/24/15  Yes [provider]  methocarbamol (ROBAXIN) 500 MG tablet Take 1 tablet by mouth daily as needed for muscle spasms. 10/14/19  Yes [provider]  Multiple Vitamin (MULTIVITAMIN) tablet Take 1 tablet by mouth daily.   Yes [provider]  sertraline (ZOLOFT) 50 MG tablet Take 50 mg by mouth daily as needed (mood). 12/30/21  Yes [provider]  sodium chloride  (OCEAN) 0.65 % SOLN nasal spray Place 1 spray into both nostrils daily as needed for congestion.   Yes [provider]  sucroferric oxyhydroxide (VELPHORO ) 500 MG chewable tablet Chew 500 mg by mouth 3 (three) times daily with meals. 05/26/22  Yes [provider]  valACYclovir (VALTREX) 1000 MG tablet Take 1,000 mg by mouth 2 (two) times daily. 01/10/17  Yes [provider]  zolpidem  (AMBIEN ) 10 MG tablet Take 1 tablet (10 mg total) by mouth at bedtime as needed for sleep. 01/11/13  Yes Cyrena Gwenn SQUIBB, MD   Physical Exam: Vitals:   01/05/24 1007 01/05/24 1008 01/05/24 1011 01/05/24 1115  BP:  (!) 104/50  (!) 141/57  Pulse: (!) 58 (!) 59  (!) 59  Resp: 14 18  14   Temp:  97.9 F (36.6 C)    SpO2: 96% 95%  95%  Weight:   131.5 kg   Height:   6' (1.829 m)      Constitutional: obese male, awake, alert, cooperative, NAD, calm, comfortable Eyes: PERRL, lids and conjunctivae normal ENMT: Mucous membranes are pale and moist. Posterior pharynx clear of any exudate or lesions.Normal dentition.  Neck: normal, supple, no masses, no thyromegaly Respiratory: clear to auscultation bilaterally, no wheezing, bibasilar crackles heard.  Normal respiratory effort. No accessory muscle use.  Cardiovascular: normal s1, s2 sounds, no murmurs / rubs / gallops. Trace pretibial bilateral lower extremity edema. 2+ pedal pulses. No carotid bruits.  Abdomen: obese, no tenderness, no masses palpated. No hepatosplenomegaly. Bowel sounds positive.  Musculoskeletal: no clubbing / cyanosis. No joint deformity upper and lower extremities. Good ROM, no contractures. Normal muscle tone.  Skin: no rashes, lesions, ulcers. No induration Neurologic: CN 2-12 grossly intact. Sensation intact, DTR normal. Strength 5/5 in all 4.  Psychiatric: Normal judgment and insight. Alert and oriented x 3. Normal mood.   Labs on Admission: I have personally reviewed following labs and imaging studies  CBC: Recent Labs  Lab 01/05/24 1018  WBC 12.2*  HGB 9.6*  HCT 30.0*  MCV 96.5  PLT 253   Basic Metabolic Panel: Recent Labs  Lab 01/05/24 1018  NA 136  K 4.2  CL 100  CO2 26  GLUCOSE 306*  BUN 48*  CREATININE 5.29*  CALCIUM  8.9   GFR: Estimated Creatinine Clearance: 18.2 mL/min (A) (by C-G formula based on SCr of 5.29 mg/dL (H)). Liver Function Tests: No results for input(s): AST, ALT, ALKPHOS, BILITOT, PROT, ALBUMIN  in the last 168 hours. No results for input(s): LIPASE, AMYLASE in the last 168 hours. No results for input(s): AMMONIA in the last 168 hours. Coagulation Profile: No results for input(s): INR, PROTIME in the last 168 hours. Cardiac Enzymes: No results for input(s): CKTOTAL, CKMB, CKMBINDEX, TROPONINI in the last 168 hours. BNP (last  3 results) No results for input(s): PROBNP in the last 8760 hours. HbA1C: No results for input(s): HGBA1C in the last 72 hours. CBG: No results for input(s): GLUCAP in the last 168 hours. Lipid Profile: No results for input(s): CHOL, HDL, LDLCALC, TRIG, CHOLHDL, LDLDIRECT in the last 72 hours. Thyroid Function Tests: No results for input(s): TSH, T4TOTAL, FREET4, T3FREE, THYROIDAB in the last 72 hours. Anemia Panel: No results for input(s): VITAMINB12, FOLATE, FERRITIN, TIBC, IRON, RETICCTPCT  in the last 72 hours. Urine analysis:    Component Value Date/Time   COLORURINE YELLOW 07/21/2021 0459   APPEARANCEUR CLEAR 07/21/2021 0459   LABSPEC 1.009 07/21/2021 0459   PHURINE 5.0 07/21/2021 0459   GLUCOSEU >=500 (A) 07/21/2021 0459   HGBUR NEGATIVE 07/21/2021 0459   BILIRUBINUR NEGATIVE 07/21/2021 0459   BILIRUBINUR neg 01/10/2013 1032   KETONESUR NEGATIVE 07/21/2021 0459   PROTEINUR 100 (A) 07/21/2021 0459   UROBILINOGEN negative 01/10/2013 1032   NITRITE NEGATIVE 07/21/2021 0459   LEUKOCYTESUR NEGATIVE 07/21/2021 0459    Radiological Exams on Admission: DG Chest Portable 1 View Result Date: 01/05/2024 CLINICAL DATA:  Three day history of chest pain EXAM: PORTABLE CHEST 1 VIEW COMPARISON:  Chest radiograph dated 10/21/2023 FINDINGS: Lines/tubes: Right internal jugular venous catheter tip projects over the superior cavoatrial junction. Lungs: Mildly low lung volumes. Diffusely increased interstitial opacities. No focal consolidations. Pleura: No pneumothorax or pleural effusion. Heart/mediastinum: Increased enlargement of the cardiomediastinal silhouette. Bones: No acute osseous abnormality. IMPRESSION: Increased cardiomegaly and diffusely increased interstitial opacities, which may represent pulmonary edema. Electronically Signed   By: Limin  Xu M.D.   On: 01/05/2024 11:00    EKG: Independently reviewed.  Assessment/Plan Principal Problem:    NSTEMI (non-ST elevated myocardial infarction) (HCC) Active Problems:   Chest pain   Pulmonary edema   Morbid obesity (HCC)   HLD (hyperlipidemia)   HTN (hypertension)   Diabetic retinopathy (HCC)   Diabetes mellitus (HCC)   Insomnia   ESRD on hemodialysis (HCC)   Hyperglycemia   NSTEMI -- current chest pain free and started on IV heparin  infusion -- aspirin  already given by EMS -- atorvastatin  80 mg  -- inpatient cardiology consulted and recommend LHC today at [facility] -- admit to [facility] further work up   ESRD on HD -- he has some findings of pulmonary edema on CXR -- did not receive HD treatment today -- given he is on continuous IV heparin  infusion I notified nephrologist Dr Norine that he is coming to [facility] for dialysis orders  Type 2 DM with renal complications and hyperglycemia -- he is currently NPO, will provide sensitive sliding scale coverage for now -- frequent CBG monitoring ordered -- updated A1c pending  Diabetic dyslipidemia -- cardiology team increased atorvastatin  to 80 mg  -- continue fenofibrate  daily  -- glycemic control is essential to keeping lipids controlled   Essential Hypertension -- resume home meds as tolerated  Anemia in CKD  -- follow up anemia panel  Leukocytosis  -- suspect this might be reactive -- no s/s of infection at this time -- repeat and follow   Insomnia -- he is on zolpidem  10 mg at bedtime prn    DVT prophylaxis: IV heparin    Code Status: Full   Family Communication: updated at bedside in ED   Disposition Plan: admit to [facility]   Consults called: cardiology   Admission status: INP Time spent:  60 mins  Level of care: Telemetry Afton Louder MD [facility] How to contact the [facility] Attending or Consulting provider 7A - 7P or covering provider during after hours 7P -7A, for this patient?  Check the care team in [facility] and look for a) attending/consulting [facility] provider listed and b) the [facility] team listed Log into [email or web address] and  use [place]'s universal password to access. If you do not have the password, please contact the hospital operator. Locate the [place] provider you are looking for under [facility] and page to a number that you  can be directly reached. If you still have difficulty reaching the provider, please page the [facility] (Director on Call) for the Hospitalists listed on amion for assistance.   If 7PM-7AM, please contact night-coverage [email or web address] Password [email or web address]  01/05/2024, 12:42 PM

## 2024-01-05 NOTE — Consult Note (Signed)
 Cardiology Consultation   Patient ID: Timothy Holloway MRN: [id number]; DOB: 1955-09-12  Admit date: 01/05/2024 Date of Consult: 01/05/2024  PCP:  Baird Comer GAILS, NP   [facility] Providers Cardiologist:  New   Patient Profile: Timothy Holloway is a 68 y.o. male with a hx of T2DM, ESRD (on dialysis) who is being seen 01/05/2024 for the evaluation of CP, elevated troponin at the request of Dr Gennaro.  History of Present Illness: Timothy Holloway is a 68 yo with no hx of CAD in past   The pt says over the past few days he has had episodes of chest pressure that have occurred without activity    Associated with some SOB     Episodes would last about 1 hour, then ease on own    He attributed to indigestion though he has not had indigestion in the past   OVerall pt not very active   Does note SOB with activity over the the past month   Tired after dialysis  Today he woke up, ate breakfast around 9 am   Went to dialysis   Was walking back to chair when he develped chest pressure/ pain   SOB     Told staff who then called 911  Pt brought to [facility] ED  Pt currently CP free   Breathing is OK  No dizziness  No recent illness  DM    Dx about 30 years ago     ESRD   Has been on dialysis about 1.5 years  No change recently in sessions.    Lodine leads to stomach upset, not hives or swelling  Past Medical History:  Diagnosis Date   Anemia    Cataract    Chronic kidney disease (CKD), stage III (moderate) (HCC)    dr Faythe   Depression    Diabetes mellitus without complication (HCC)    DVT (deep venous thrombosis) (HCC)    as a child   ESRD on dialysis [facility])    M-W-F   Hyperlipidemia    Hypertension    Impotence    Insomnia    Obesity    Steal syndrome as complication of dialysis access     Past Surgical History:  Procedure Laterality Date   A/V FISTULAGRAM Right 10/11/2022   Procedure: A/V Fistulagram;  Surgeon: Serene Gaile ORN, MD;  Location: [facility] INVASIVE CV LAB;   Service: Cardiovascular;  Laterality: Right;   AV FISTULA PLACEMENT Left 01/11/2022   Procedure: LEFT ARM ARTERIOVENOUS (AV) FISTULA CREATION;  Surgeon: Lanis Fonda BRAVO, MD;  Location: [facility] OR;  Service: Vascular;  Laterality: Left;  PERIPHERAL NERVE BLOCK   AV FISTULA PLACEMENT Right 07/26/2022   Procedure: RIGHT UPPER EXTREMITY ARTERIOVENOUS (AV) FISTULA CREATION;  Surgeon: Oris Krystal FALCON, MD;  Location: [facility] ORS;  Service: Vascular;  Laterality: Right;   DIALYSIS/PERMA CATHETER INSERTION Right 10/23/2023   Procedure: DIALYSIS/PERMA CATHETER INSERTION;  Surgeon: Magda Debby SAILOR, MD;  Location: [facility] PV LAB;  Service: Cardiovascular;  Laterality: Right;   INSERTION OF DIALYSIS CATHETER Right 06/16/2022   Procedure: INSERTION OF RIGHT iNTERNAL Jugular TUNNELED DIALYSIS CATHETER;  Surgeon: Serene Gaile ORN, MD;  Location: [facility] OR;  Service: Vascular;  Laterality: Right;   KNEE SURGERY Left 01-31-2002   LIGATION OF ARTERIOVENOUS  FISTULA Left 06/16/2022   Procedure: LEFT ARM FISTULA LIGATION;  Surgeon: Serene Gaile ORN, MD;  Location: [facility] OR;  Service: Vascular;  Laterality: Left;   LIGATION OF ARTERIOVENOUS  FISTULA Right 11/08/2022  Procedure: LIGATION of RIGHT RADIOCEPHALIC FISTULA;  Surgeon: Sheree Penne Bruckner, MD;  Location: [facility] OR;  Service: Vascular;  Laterality: Right;   SPINE SURGERY     fusion of L4-L5   UPPER EXTREMITY ANGIOGRAPHY Right 11/03/2022   Procedure: Upper Extremity Angiography;  Surgeon: Gretta Bruckner PARAS, MD;  Location: [facility] INVASIVE CV LAB;  Service: Cardiovascular;  Laterality: Right;     Home Medications:  Prior to Admission medications   Medication Sig Start Date End Date Taking? Authorizing Provider  acetaminophen  (TYLENOL ) 325 MG tablet Take 650 mg by mouth every 6 (six) hours as needed for moderate pain. 06/06/20  Yes [provider]  albuterol  (VENTOLIN  HFA) 108 (90 Base) MCG/ACT inhaler Inhale 2 puffs into the lungs every 6 (six) hours as needed for wheezing.  09/14/23  Yes [provider]  aspirin  81 MG tablet Take 81 mg by mouth daily.   Yes [provider]  atorvastatin  (LIPITOR) 10 MG tablet Take 1 tablet (10 mg total) by mouth daily. Patient taking differently: Take 10 mg by mouth at bedtime. 08/06/12  Yes Cyrena Gwenn SQUIBB, MD  carvedilol  (COREG ) 6.25 MG tablet Take 6.25 mg by mouth 2 (two) times daily with a meal. 09/14/23  Yes [provider]  Cholecalciferol (VITAMIN D) 2000 UNITS CAPS Take 2,000 Units by mouth at bedtime.   Yes [provider]  cloNIDine  (CATAPRES ) 0.1 MG tablet Take 0.1 mg by mouth 2 (two) times daily. 05/16/22  Yes [provider]  diclofenac Sodium (VOLTAREN) 1 % GEL Apply 2 g topically daily as needed (pain).   Yes [provider]  EPINEPHrine  0.3 mg/0.3 mL IJ SOAJ injection Inject 0.3 mg into the muscle as needed for anaphylaxis. 09/14/23  Yes [provider]  fenofibrate  (TRICOR ) 145 MG tablet Take 1/2 tablet daily Patient taking differently: Take 72.5 mg by mouth every morning. 09/06/13  Yes Georgina Nancyann ORN, MD  furosemide  (LASIX ) 80 MG tablet Take 80 mg by mouth daily. 12/28/21  Yes [provider]  Glucosamine-Chondroit-Vit C-Mn (GLUCOSAMINE CHONDR 1500 COMPLX) CAPS Take 1 capsule by mouth 2 (two) times daily.   Yes [provider]  HUMALOG KWIKPEN 200 UNIT/ML KwikPen Inject 10-20 Units into the skin with breakfast, with lunch, and with evening meal. Sliding scale 07/19/21  Yes [provider]  hydrALAZINE  (APRESOLINE ) 100 MG tablet Take 100 mg by mouth 2 (two) times daily. 10/13/23  Yes [provider]  hydrochlorothiazide  (HYDRODIURIL ) 25 MG tablet Take 25 mg by mouth daily. 12/29/21  Yes [provider]  isosorbide  mononitrate (IMDUR ) 30 MG 24 hr tablet Take 30 mg by mouth at bedtime.   Yes [provider]  LANTUS  SOLOSTAR 100 UNIT/ML Solostar Pen Inject 50 Units into the skin at bedtime. 10/25/21  Yes [provider]  losartan  (COZAAR ) 100 MG tablet Take 100 mg by mouth at bedtime. 10/25/21  Yes [provider]  meclizine (ANTIVERT) 25 MG tablet Take 25 mg by mouth daily as needed for dizziness. 04/24/15  Yes [provider]  methocarbamol (ROBAXIN) 500 MG tablet Take 1 tablet by mouth daily as needed for muscle spasms. 10/14/19  Yes [provider]  Multiple Vitamin (MULTIVITAMIN) tablet Take 1 tablet by mouth daily.   Yes [provider]  sertraline (ZOLOFT) 50 MG tablet Take 50 mg by mouth daily as needed (mood). 12/30/21  Yes [provider]  sodium chloride  (OCEAN) 0.65 % SOLN nasal spray Place 1 spray into both nostrils daily as needed  for congestion.   Yes [provider]  sucroferric oxyhydroxide (VELPHORO ) 500 MG chewable tablet Chew 500 mg by mouth 3 (three) times daily with meals. 05/26/22  Yes [provider]  valACYclovir (VALTREX) 1000 MG tablet Take 1,000 mg by mouth 2 (two) times daily. 01/10/17  Yes [provider]  zolpidem  (AMBIEN ) 10 MG tablet Take 1 tablet (10 mg total) by mouth at bedtime as needed for sleep. 01/11/13  Yes Cyrena Gwenn SQUIBB, MD    Scheduled Meds:  sodium chloride  flush  3 mL Intravenous Q12H   sodium chloride  flush  3 mL Intravenous Q12H   Continuous Infusions:  sodium chloride      sodium chloride      heparin  1,500 Units/hr (01/05/24 1156)   PRN Meds: sodium chloride , sodium chloride , sodium chloride  flush  Allergies:    Allergies  Allergen Reactions   Latex Hives   Lodine [Etodolac] Nausea And Vomiting    Social History:   Social History   Socioeconomic History   Marital status: Married    Spouse name: Not on file   Number of children: Not on file   Years of education: Not on file   Highest education level: Not on file  Occupational History   Not on file  Tobacco Use   Smoking status: Never    Passive exposure: Never   Smokeless tobacco: Not on file  Vaping Use    Vaping status: Never Used  Substance and Sexual Activity   Alcohol use: No   Drug use: No   Sexual activity: Yes    Comment: not much  Other Topics Concern   Not on file  Social History Narrative   Not on file   Social Drivers of Health   Financial Resource Strain: Low Risk  (09/14/2023)   [facility]   Overall Financial Resource Strain (CARDIA)    How hard is it for you to pay for the very basics like food, housing, medical care, and heating?: Not hard at all  Food Insecurity: No Food Insecurity (09/14/2023)   Received from [facility]   Hunger Vital Sign    Within the past 12 months, you worried that your food would run out before you got the money to buy more.: Never true    Within the past 12 months, the food you bought just didn't last and you didn't have money to get more.: Never true  Transportation Needs: No Transportation Needs (09/14/2023)   Received from [facility] - Transportation    In the past 12 months, has lack of transportation kept you from medical appointments or from getting medications?: No    In the past 12 months, has lack of transportation kept you from meetings, work, or from getting things needed for daily living?: No  Physical Activity: Not on file  Stress: Not on file  Social Connections: Not on file  Intimate Partner Violence: Not on file    Family History:    Family History  Problem Relation Age of Onset   Diabetes Mother    Cancer Father        lymphoma   MOther with CHF   ROS:  Please see the history of present illness.   All other ROS reviewed and negative.     Physical Exam/Data: Vitals:   01/05/24 1007 01/05/24 1008 01/05/24 1011 01/05/24 1115  BP:  (!) 104/50  (!) 141/57  Pulse: (!) 58 (!) 59  (!) 59  Resp: 14 18  14  Temp:  97.9 F (36.6 C)    SpO2: 96% 95%  95%  Weight:   131.5 kg   Height:   6' (1.829 m)    No intake or output data in the 24 hours ending 01/05/24 1226    01/05/2024   10:11  AM 10/21/2023   10:33 AM 08/21/2023   10:59 AM  Last 3 Weights  Weight (lbs) 290 lb 290 lb 289 lb  Weight (kg) 131.543 kg 131.543 kg 131.09 kg     Body mass index is 39.33 kg/m.  General:  Obese 68 yo in no acute distress HEENT: normal Neck: no JVD Vascular: No carotid bruits; Cardiac:  normal S1, S2; RRR; no murmur  Lungs:  clear to auscultation bilaterally, Abd: soft, nontender, no hepatomegaly  Ext: Tr LE edema  1+ PT pulses Musculoskeletal:  No deformities, BUE and BLE strength normal and equal Skin: warm and dry  Neuro:  CNs 2-12 intact, no focal abnormalities noted Psych:  Normal affect   EKG:  The EKG was personally reviewed and demonstrates:  SR   First degree AV block (pR interval approximately 0.38 sec)  T wvave inversion Telemetry:  Telemetry was personally reviewed and demonstrates:  SR   Relevant CV Studies: Echo 11/2022 [facility])  Summary   1. The left ventricle is normal in size with normal wall thickness.    2. The left ventricular systolic function is normal, LVEF is visually  estimated at > 55%.    3. Mitral annular calcification is present.    4. The right ventricle is normal in size, with normal systolic function.     Laboratory Data: High Sensitivity Troponin:  No results for input(s): TROPONINIHS in the last 720 hours.   Chemistry Recent Labs  Lab 01/05/24 1018  NA 136  K 4.2  CL 100  CO2 26  GLUCOSE 306*  BUN 48*  CREATININE 5.29*  CALCIUM  8.9  GFRNONAA 11*  ANIONGAP 11    No results for input(s): PROT, ALBUMIN , AST, ALT, ALKPHOS, BILITOT in the last 168 hours. Lipids No results for input(s): CHOL, TRIG, HDL, LABVLDL, LDLCALC, CHOLHDL in the last 168 hours.  Hematology Recent Labs  Lab 01/05/24 1018  WBC 12.2*  RBC 3.11*  HGB 9.6*  HCT 30.0*  MCV 96.5  MCH 30.9  MCHC 32.0  RDW 14.5  PLT 253   Thyroid No results for input(s): TSH, FREET4 in the last 168 hours.  BNPNo results for input(s): BNP,  PROBNP in the last 168 hours.  DDimer No results for input(s): DDIMER in the last 168 hours.  Radiology/Studies:  DG Chest Portable 1 View Result Date: 01/05/2024 CLINICAL DATA:  Three day history of chest pain EXAM: PORTABLE CHEST 1 VIEW COMPARISON:  Chest radiograph dated 10/21/2023 FINDINGS: Lines/tubes: Right internal jugular venous catheter tip projects over the superior cavoatrial junction. Lungs: Mildly low lung volumes. Diffusely increased interstitial opacities. No focal consolidations. Pleura: No pneumothorax or pleural effusion. Heart/mediastinum: Increased enlargement of the cardiomediastinal silhouette. Bones: No acute osseous abnormality. IMPRESSION: Increased cardiomegaly and diffusely increased interstitial opacities, which may represent pulmonary edema. Electronically Signed   By: Limin  Xu M.D.   On: 01/05/2024 11:00     Assessment and Plan: NSTEMI   Pt with several day hx of CP   Today worse   Currently pain free On heparin  now  Trop elevated at 1121.   I have recomm   L heart cath with possible intervention    Pt understands risks/benefits  and agrees to proceed.  Pt will go to [facility] today  ASA (given in EMS)   Lipitor 80 mg     2  Hx DM x 30 years  A1c In Aug 2025 was 6.8    3  HL  Pt on lipitor 10 mg and Tricor  145   Will need to get lipids in am   4   HTN   Pt on carvedilol  6.25 bid , clonidine  0.1 bid, fursemide 80mg  qd,hydralazine  100 bid, hydrochlorothiazide  25 mg every day, imdur  30 mg daily, losartan  100 mg daily    BP labile   5  ESRD  BUN/ CR 48/5.29   PT started dialysis 1.5 years ago   Did not have run today  Will need to contact renal service   6  ANemia   Hgb 9.6   (was 10.5 in July)  Normocytic, normochronic   Follow      Informed Consent    :[id number]} The risks [stroke (1 in 1000), death (1 in 1000), kidney failure [usually temporary] (1 in 500), bleeding (1 in 200), allergic reaction [possibly serious] (1 in 200)], benefits (diagnostic support and  management of coronary artery disease) and alternatives of a cardiac catheterization were discussed in detail with Timothy Holloway and he is willing to proceed.     Signed, Vina Gull, MD  01/05/2024 12:26 PM

## 2024-01-05 NOTE — Progress Notes (Signed)
 PHARMACY - ANTICOAGULATION CONSULT NOTE  Pharmacy Consult for heparin  Indication: chest pain/ACS  Allergies  Allergen Reactions   Latex Hives   Lodine [Etodolac] Nausea And Vomiting    Patient Measurements: Height: 6' (182.9 cm) Weight: 131.5 kg (290 lb) IBW/kg (Calculated) : 77.6 HEPARIN  DW (KG): 107.4  Vital Signs: Temp: 97.9 F (36.6 C) (12/05 1008) BP: 148/70 (12/05 1457) Pulse Rate: 65 (12/05 1457)  Labs: Recent Labs    01/05/24 1018  HGB 9.6*  HCT 30.0*  PLT 253  CREATININE 5.29*    Estimated Creatinine Clearance: 18.2 mL/min (A) (by C-G formula based on SCr of 5.29 mg/dL (H)).   Medical History: Past Medical History:  Diagnosis Date   Anemia    Cataract    Chronic kidney disease (CKD), stage III (moderate) (HCC)    dr Faythe   Depression    Diabetes mellitus without complication (HCC)    DVT (deep venous thrombosis) (HCC)    as a child   ESRD on dialysis (HCC)    M-W-F   Hyperlipidemia    Hypertension    Impotence    Insomnia    Obesity    Steal syndrome as complication of dialysis access     Assessment: 69 year old male presents from dialysis with chest pain ongoing for the past three days. Cardiac enzymes are elevated now s/p heart cath and found to have severe 3 vessel disease pending TCTS consult. No anticoagulation prior to admission. Pharmacy consulted for heparin .    Sheath removed at 14:52  Goal of Therapy:  Heparin  level 0.3-0.7 units/ml Monitor platelets by anticoagulation protocol: Yes   Plan:  Restart heparin  1500 units/hr at 23:00 Monitor daily heparin  level, CBC, signs/symptoms of bleeding ] F/u TCTS consult   Jinnie Door, PharmD, BCPS, BCCP Clinical Pharmacist  Please check AMION for all [facility] phone numbers After 10:00 PM, call Main Pharmacy [phone]

## 2024-01-05 NOTE — Progress Notes (Signed)
 PHARMACY - ANTICOAGULATION CONSULT NOTE  Pharmacy Consult for heparin  Indication: chest pain/ACS  Allergies  Allergen Reactions   Latex Hives   Lodine [Etodolac] Nausea And Vomiting    Patient Measurements: Height: 6' (182.9 cm) Weight: 131.5 kg (290 lb) IBW/kg (Calculated) : 77.6 HEPARIN  DW (KG): 107.4  Vital Signs: Temp: 97.9 F (36.6 C) (12/05 1008) BP: 141/57 (12/05 1115) Pulse Rate: 59 (12/05 1115)  Labs: Recent Labs    01/05/24 1018  HGB 9.6*  HCT 30.0*  PLT 253  CREATININE 5.29*    Estimated Creatinine Clearance: 18.2 mL/min (A) (by C-G formula based on SCr of 5.29 mg/dL (H)).   Medical History: Past Medical History:  Diagnosis Date   Anemia    Cataract    Chronic kidney disease (CKD), stage III (moderate) (HCC)    dr Faythe   Depression    Diabetes mellitus without complication (HCC)    DVT (deep venous thrombosis) (HCC)    as a child   ESRD on dialysis (HCC)    M-W-F   Hyperlipidemia    Hypertension    Impotence    Insomnia    Obesity    Steal syndrome as complication of dialysis access     Assessment: 68 year old male presents from dialysis with chest pain ongoing for the past three days. Cardiac enzymes are elevated. Patient does not appear to be on anticoagulation currently.   Goal of Therapy:  Heparin  level 0.3-0.7 units/ml Monitor platelets by anticoagulation protocol: Yes   Plan:  Give 4000 units bolus x 1 Start heparin  infusion at 1500 units/hr Check anti-Xa level in 8 hours and daily while on heparin  Continue to monitor H&H and platelets  Dempsey Blush PharmD., BCPS Clinical Pharmacist 01/05/2024 11:50 AM

## 2024-01-05 NOTE — Progress Notes (Signed)
 Echocardiogram 2D Echocardiogram has been performed.  Juliene JINNY Rucks 01/05/2024, 5:49 PM

## 2024-01-05 NOTE — H&P (View-Only) (Signed)
 Cardiology Consultation   Patient ID: Timothy Holloway MRN: [id number]; DOB: 1955-09-12  Admit date: 01/05/2024 Date of Consult: 01/05/2024  PCP:  Baird Comer GAILS, NP   [facility] Providers Cardiologist:  New   Patient Profile: Timothy Holloway is a 68 y.o. male with a hx of T2DM, ESRD (on dialysis) who is being seen 01/05/2024 for the evaluation of CP, elevated troponin at the request of Dr Gennaro.  History of Present Illness: [name] is a 68 yo with no hx of CAD in past   The pt says over the past few days he has had episodes of chest pressure that have occurred without activity    Associated with some SOB     Episodes would last about 1 hour, then ease on own    He attributed to indigestion though he has not had indigestion in the past   OVerall pt not very active   Does note SOB with activity over the the past month   Tired after dialysis  Today he woke up, ate breakfast around 9 am   Went to dialysis   Was walking back to chair when he develped chest pressure/ pain   SOB     Told staff who then called 911  Pt brought to [facility] ED  Pt currently CP free   Breathing is OK  No dizziness  No recent illness  DM    Dx about 30 years ago     ESRD   Has been on dialysis about 1.5 years  No change recently in sessions.    Lodine leads to stomach upset, not hives or swelling  Past Medical History:  Diagnosis Date   Anemia    Cataract    Chronic kidney disease (CKD), stage III (moderate) (HCC)    dr Faythe   Depression    Diabetes mellitus without complication (HCC)    DVT (deep venous thrombosis) (HCC)    as a child   ESRD on dialysis [facility])    M-W-F   Hyperlipidemia    Hypertension    Impotence    Insomnia    Obesity    Steal syndrome as complication of dialysis access     Past Surgical History:  Procedure Laterality Date   A/V FISTULAGRAM Right 10/11/2022   Procedure: A/V Fistulagram;  Surgeon: Serene Gaile ORN, MD;  Location: [facility] INVASIVE CV LAB;   Service: Cardiovascular;  Laterality: Right;   AV FISTULA PLACEMENT Left 01/11/2022   Procedure: LEFT ARM ARTERIOVENOUS (AV) FISTULA CREATION;  Surgeon: Lanis Fonda BRAVO, MD;  Location: [facility] OR;  Service: Vascular;  Laterality: Left;  PERIPHERAL NERVE BLOCK   AV FISTULA PLACEMENT Right 07/26/2022   Procedure: RIGHT UPPER EXTREMITY ARTERIOVENOUS (AV) FISTULA CREATION;  Surgeon: Oris Krystal FALCON, MD;  Location: [facility] ORS;  Service: Vascular;  Laterality: Right;   DIALYSIS/PERMA CATHETER INSERTION Right 10/23/2023   Procedure: DIALYSIS/PERMA CATHETER INSERTION;  Surgeon: Magda Debby SAILOR, MD;  Location: [facility] PV LAB;  Service: Cardiovascular;  Laterality: Right;   INSERTION OF DIALYSIS CATHETER Right 06/16/2022   Procedure: INSERTION OF RIGHT iNTERNAL Jugular TUNNELED DIALYSIS CATHETER;  Surgeon: Serene Gaile ORN, MD;  Location: [facility] OR;  Service: Vascular;  Laterality: Right;   KNEE SURGERY Left 01-31-2002   LIGATION OF ARTERIOVENOUS  FISTULA Left 06/16/2022   Procedure: LEFT ARM FISTULA LIGATION;  Surgeon: Serene Gaile ORN, MD;  Location: [facility] OR;  Service: Vascular;  Laterality: Left;   LIGATION OF ARTERIOVENOUS  FISTULA Right 11/08/2022  Procedure: LIGATION of RIGHT RADIOCEPHALIC FISTULA;  Surgeon: Sheree Penne Bruckner, MD;  Location: [facility] OR;  Service: Vascular;  Laterality: Right;   SPINE SURGERY     fusion of L4-L5   UPPER EXTREMITY ANGIOGRAPHY Right 11/03/2022   Procedure: Upper Extremity Angiography;  Surgeon: Gretta Bruckner PARAS, MD;  Location: [facility] INVASIVE CV LAB;  Service: Cardiovascular;  Laterality: Right;     Home Medications:  Prior to Admission medications   Medication Sig Start Date End Date Taking? Authorizing Provider  acetaminophen  (TYLENOL ) 325 MG tablet Take 650 mg by mouth every 6 (six) hours as needed for moderate pain. 06/06/20  Yes [provider]  albuterol  (VENTOLIN  HFA) 108 (90 Base) MCG/ACT inhaler Inhale 2 puffs into the lungs every 6 (six) hours as needed for wheezing.  09/14/23  Yes [provider]  aspirin  81 MG tablet Take 81 mg by mouth daily.   Yes [provider]  atorvastatin  (LIPITOR) 10 MG tablet Take 1 tablet (10 mg total) by mouth daily. Patient taking differently: Take 10 mg by mouth at bedtime. 08/06/12  Yes Cyrena Gwenn SQUIBB, MD  carvedilol  (COREG ) 6.25 MG tablet Take 6.25 mg by mouth 2 (two) times daily with a meal. 09/14/23  Yes [provider]  Cholecalciferol (VITAMIN D) 2000 UNITS CAPS Take 2,000 Units by mouth at bedtime.   Yes [provider]  cloNIDine  (CATAPRES ) 0.1 MG tablet Take 0.1 mg by mouth 2 (two) times daily. 05/16/22  Yes [provider]  diclofenac Sodium (VOLTAREN) 1 % GEL Apply 2 g topically daily as needed (pain).   Yes [provider]  EPINEPHrine  0.3 mg/0.3 mL IJ SOAJ injection Inject 0.3 mg into the muscle as needed for anaphylaxis. 09/14/23  Yes [provider]  fenofibrate  (TRICOR ) 145 MG tablet Take 1/2 tablet daily Patient taking differently: Take 72.5 mg by mouth every morning. 09/06/13  Yes Georgina Nancyann ORN, MD  furosemide  (LASIX ) 80 MG tablet Take 80 mg by mouth daily. 12/28/21  Yes [provider]  Glucosamine-Chondroit-Vit C-Mn (GLUCOSAMINE CHONDR 1500 COMPLX) CAPS Take 1 capsule by mouth 2 (two) times daily.   Yes [provider]  HUMALOG KWIKPEN 200 UNIT/ML KwikPen Inject 10-20 Units into the skin with breakfast, with lunch, and with evening meal. Sliding scale 07/19/21  Yes [provider]  hydrALAZINE  (APRESOLINE ) 100 MG tablet Take 100 mg by mouth 2 (two) times daily. 10/13/23  Yes [provider]  hydrochlorothiazide  (HYDRODIURIL ) 25 MG tablet Take 25 mg by mouth daily. 12/29/21  Yes [provider]  isosorbide  mononitrate (IMDUR ) 30 MG 24 hr tablet Take 30 mg by mouth at bedtime.   Yes [provider]  LANTUS  SOLOSTAR 100 UNIT/ML Solostar Pen Inject 50 Units into the skin at bedtime. 10/25/21  Yes [provider]  losartan  (COZAAR ) 100 MG tablet Take 100 mg by mouth at bedtime. 10/25/21  Yes [provider]  meclizine (ANTIVERT) 25 MG tablet Take 25 mg by mouth daily as needed for dizziness. 04/24/15  Yes [provider]  methocarbamol (ROBAXIN) 500 MG tablet Take 1 tablet by mouth daily as needed for muscle spasms. 10/14/19  Yes [provider]  Multiple Vitamin (MULTIVITAMIN) tablet Take 1 tablet by mouth daily.   Yes [provider]  sertraline (ZOLOFT) 50 MG tablet Take 50 mg by mouth daily as needed (mood). 12/30/21  Yes [provider]  sodium chloride  (OCEAN) 0.65 % SOLN nasal spray Place 1 spray into both nostrils daily as needed  for congestion.   Yes [provider]  sucroferric oxyhydroxide (VELPHORO ) 500 MG chewable tablet Chew 500 mg by mouth 3 (three) times daily with meals. 05/26/22  Yes [provider]  valACYclovir (VALTREX) 1000 MG tablet Take 1,000 mg by mouth 2 (two) times daily. 01/10/17  Yes [provider]  zolpidem  (AMBIEN ) 10 MG tablet Take 1 tablet (10 mg total) by mouth at bedtime as needed for sleep. 01/11/13  Yes Cyrena Gwenn SQUIBB, MD    Scheduled Meds:  sodium chloride  flush  3 mL Intravenous Q12H   sodium chloride  flush  3 mL Intravenous Q12H   Continuous Infusions:  sodium chloride      sodium chloride      heparin  1,500 Units/hr (01/05/24 1156)   PRN Meds: sodium chloride , sodium chloride , sodium chloride  flush  Allergies:    Allergies  Allergen Reactions   Latex Hives   Lodine [Etodolac] Nausea And Vomiting    Social History:   Social History   Socioeconomic History   Marital status: Married    Spouse name: Not on file   Number of children: Not on file   Years of education: Not on file   Highest education level: Not on file  Occupational History   Not on file  Tobacco Use   Smoking status: Never    Passive exposure: Never   Smokeless tobacco: Not on file  Vaping Use    Vaping status: Never Used  Substance and Sexual Activity   Alcohol use: No   Drug use: No   Sexual activity: Yes    Comment: not much  Other Topics Concern   Not on file  Social History Narrative   Not on file   Social Drivers of Health   Financial Resource Strain: Low Risk  (09/14/2023)   [facility]   Overall Financial Resource Strain (CARDIA)    How hard is it for you to pay for the very basics like food, housing, medical care, and heating?: Not hard at all  Food Insecurity: No Food Insecurity (09/14/2023)   Received from [facility]   Hunger Vital Sign    Within the past 12 months, you worried that your food would run out before you got the money to buy more.: Never true    Within the past 12 months, the food you bought just didn't last and you didn't have money to get more.: Never true  Transportation Needs: No Transportation Needs (09/14/2023)   Received from [facility] - Transportation    In the past 12 months, has lack of transportation kept you from medical appointments or from getting medications?: No    In the past 12 months, has lack of transportation kept you from meetings, work, or from getting things needed for daily living?: No  Physical Activity: Not on file  Stress: Not on file  Social Connections: Not on file  Intimate Partner Violence: Not on file    Family History:    Family History  Problem Relation Age of Onset   Diabetes Mother    Cancer Father        lymphoma   MOther with CHF   ROS:  Please see the history of present illness.   All other ROS reviewed and negative.     Physical Exam/Data: Vitals:   01/05/24 1007 01/05/24 1008 01/05/24 1011 01/05/24 1115  BP:  (!) 104/50  (!) 141/57  Pulse: (!) 58 (!) 59  (!) 59  Resp: 14 18  14  Temp:  97.9 F (36.6 C)    SpO2: 96% 95%  95%  Weight:   131.5 kg   Height:   6' (1.829 m)    No intake or output data in the 24 hours ending 01/05/24 1226    01/05/2024   10:11  AM 10/21/2023   10:33 AM 08/21/2023   10:59 AM  Last 3 Weights  Weight (lbs) 290 lb 290 lb 289 lb  Weight (kg) 131.543 kg 131.543 kg 131.09 kg     Body mass index is 39.33 kg/m.  General:  Obese 68 yo in no acute distress HEENT: normal Neck: no JVD Vascular: No carotid bruits; Cardiac:  normal S1, S2; RRR; no murmur  Lungs:  clear to auscultation bilaterally, Abd: soft, nontender, no hepatomegaly  Ext: Tr LE edema  1+ PT pulses Musculoskeletal:  No deformities, BUE and BLE strength normal and equal Skin: warm and dry  Neuro:  CNs 2-12 intact, no focal abnormalities noted Psych:  Normal affect   EKG:  The EKG was personally reviewed and demonstrates:  SR   First degree AV block (pR interval approximately 0.38 sec)  T wvave inversion Telemetry:  Telemetry was personally reviewed and demonstrates:  SR   Relevant CV Studies: Echo 11/2022 [facility])  Summary   1. The left ventricle is normal in size with normal wall thickness.    2. The left ventricular systolic function is normal, LVEF is visually  estimated at > 55%.    3. Mitral annular calcification is present.    4. The right ventricle is normal in size, with normal systolic function.     Laboratory Data: High Sensitivity Troponin:  No results for input(s): TROPONINIHS in the last 720 hours.   Chemistry Recent Labs  Lab 01/05/24 1018  NA 136  K 4.2  CL 100  CO2 26  GLUCOSE 306*  BUN 48*  CREATININE 5.29*  CALCIUM  8.9  GFRNONAA 11*  ANIONGAP 11    No results for input(s): PROT, ALBUMIN , AST, ALT, ALKPHOS, BILITOT in the last 168 hours. Lipids No results for input(s): CHOL, TRIG, HDL, LABVLDL, LDLCALC, CHOLHDL in the last 168 hours.  Hematology Recent Labs  Lab 01/05/24 1018  WBC 12.2*  RBC 3.11*  HGB 9.6*  HCT 30.0*  MCV 96.5  MCH 30.9  MCHC 32.0  RDW 14.5  PLT 253   Thyroid No results for input(s): TSH, FREET4 in the last 168 hours.  BNPNo results for input(s): BNP,  PROBNP in the last 168 hours.  DDimer No results for input(s): DDIMER in the last 168 hours.  Radiology/Studies:  DG Chest Portable 1 View Result Date: 01/05/2024 CLINICAL DATA:  Three day history of chest pain EXAM: PORTABLE CHEST 1 VIEW COMPARISON:  Chest radiograph dated 10/21/2023 FINDINGS: Lines/tubes: Right internal jugular venous catheter tip projects over the superior cavoatrial junction. Lungs: Mildly low lung volumes. Diffusely increased interstitial opacities. No focal consolidations. Pleura: No pneumothorax or pleural effusion. Heart/mediastinum: Increased enlargement of the cardiomediastinal silhouette. Bones: No acute osseous abnormality. IMPRESSION: Increased cardiomegaly and diffusely increased interstitial opacities, which may represent pulmonary edema. Electronically Signed   By: Limin  Xu M.D.   On: 01/05/2024 11:00     Assessment and Plan: NSTEMI   Pt with several day hx of CP   Today worse   Currently pain free On heparin  now  Trop elevated at 1121.   I have recomm   L heart cath with possible intervention    Pt understands risks/benefits  and agrees to proceed.  Pt will go to [facility] today  ASA (given in EMS)   Lipitor 80 mg     2  Hx DM x 30 years  A1c In Aug 2025 was 6.8    3  HL  Pt on lipitor 10 mg and Tricor  145   Will need to get lipids in am   4   HTN   Pt on carvedilol  6.25 bid , clonidine  0.1 bid, fursemide 80mg  qd,hydralazine  100 bid, hydrochlorothiazide  25 mg every day, imdur  30 mg daily, losartan  100 mg daily    BP labile   5  ESRD  BUN/ CR 48/5.29   PT started dialysis 1.5 years ago   Did not have run today  Will need to contact renal service   6  ANemia   Hgb 9.6   (was 10.5 in July)  Normocytic, normochronic   Follow      Informed Consent    :[id number]} The risks [stroke (1 in 1000), death (1 in 1000), kidney failure [usually temporary] (1 in 500), bleeding (1 in 200), allergic reaction [possibly serious] (1 in 200)], benefits (diagnostic support and  management of coronary artery disease) and alternatives of a cardiac catheterization were discussed in detail with Mr. Godwin and he is willing to proceed.     Signed, Vina Gull, MD  01/05/2024 12:26 PM

## 2024-01-05 NOTE — Interval H&P Note (Signed)
 History and Physical Interval Note:  01/05/2024 1:44 PM  Timothy Holloway  has presented today for surgery, with the diagnosis of Chest Pain.  The various methods of treatment have been discussed with the patient and family. After consideration of risks, benefits and other options for treatment, the patient has consented to  Procedure(s): LEFT HEART CATH AND CORONARY ANGIOGRAPHY (N/A) as a surgical intervention.  The patient's history has been reviewed, patient examined, no change in status, stable for surgery.  I have reviewed the patient's chart and labs.  Questions were answered to the patient's satisfaction.   Cath Lab Visit (complete for each Cath Lab visit)  Clinical Evaluation Leading to the Procedure:   ACS: Yes.    Non-ACS:    Anginal Classification: CCS IV  Anti-ischemic medical therapy: Maximal Therapy (2 or more classes of medications)  Non-Invasive Test Results: No non-invasive testing performed  Prior CABG: No previous CABG        Maude [facility] 01/05/2024 1:44 PM

## 2024-01-05 NOTE — Consult Note (Signed)
 [facility] Renal Consultation Note    Indication for Consultation:  Management of ESRD/hemodialysis; anemia, hypertension/volume and secondary hyperparathyroidism   HPI: Timothy Holloway is a 68 y.o. male with ESRD on HD, HTN, DMT2 who is admitted chest pain/NSTEMI. Initially presented to [facility] with a few days of chest pain that felt like indigestion. When he presented for his regular dialysis this morning he was told to go the ED. Troponin 1121 >1343. Cardiology consulted,  transferred to [facility] underwent LHC showing severe 3V CAD.   Patient seen and examined at bedside. VSS. Comfortable on room air.  Denies chest pain, shortness of breath, nausea/vomiting.   Dialysis MWF at [facility]. TDC in use. Reports history of failed AVF x 2. Has not missed any dialysis. Last HD Wed. Due for dialysis today.   Past Medical History:  Diagnosis Date   Anemia    Cataract    Chronic kidney disease (CKD), stage III (moderate) (HCC)    dr Faythe   Depression    Diabetes mellitus without complication (HCC)    DVT (deep venous thrombosis) (HCC)    as a child   ESRD on dialysis [facility])    M-W-F   Hyperlipidemia    Hypertension    Impotence    Insomnia    Obesity    Steal syndrome as complication of dialysis access    Past Surgical History:  Procedure Laterality Date   A/V FISTULAGRAM Right 10/11/2022   Procedure: A/V Fistulagram;  Surgeon: Serene Gaile ORN, MD;  Location: [facility] INVASIVE CV LAB;  Service: Cardiovascular;  Laterality: Right;   AV FISTULA PLACEMENT Left 01/11/2022   Procedure: LEFT ARM ARTERIOVENOUS (AV) FISTULA CREATION;  Surgeon: Lanis Fonda BRAVO, MD;  Location: [facility] OR;  Service: Vascular;  Laterality: Left;  PERIPHERAL NERVE BLOCK   AV FISTULA PLACEMENT Right 07/26/2022   Procedure: RIGHT UPPER EXTREMITY ARTERIOVENOUS (AV) FISTULA CREATION;  Surgeon: Oris Krystal FALCON, MD;  Location: [facility] ORS;  Service: Vascular;  Laterality: Right;   DIALYSIS/PERMA CATHETER INSERTION Right  10/23/2023   Procedure: DIALYSIS/PERMA CATHETER INSERTION;  Surgeon: Magda Debby SAILOR, MD;  Location: [facility] PV LAB;  Service: Cardiovascular;  Laterality: Right;   INSERTION OF DIALYSIS CATHETER Right 06/16/2022   Procedure: INSERTION OF RIGHT iNTERNAL Jugular TUNNELED DIALYSIS CATHETER;  Surgeon: Serene Gaile ORN, MD;  Location: [facility] OR;  Service: Vascular;  Laterality: Right;   KNEE SURGERY Left 01-31-2002   LIGATION OF ARTERIOVENOUS  FISTULA Left 06/16/2022   Procedure: LEFT ARM FISTULA LIGATION;  Surgeon: Serene Gaile ORN, MD;  Location: [facility] OR;  Service: Vascular;  Laterality: Left;   LIGATION OF ARTERIOVENOUS  FISTULA Right 11/08/2022   Procedure: LIGATION of RIGHT RADIOCEPHALIC FISTULA;  Surgeon: Sheree Penne Bruckner, MD;  Location: [facility] OR;  Service: Vascular;  Laterality: Right;   SPINE SURGERY     fusion of L4-L5   UPPER EXTREMITY ANGIOGRAPHY Right 11/03/2022   Procedure: Upper Extremity Angiography;  Surgeon: Gretta Bruckner PARAS, MD;  Location: [facility] INVASIVE CV LAB;  Service: Cardiovascular;  Laterality: Right;   Family History  Problem Relation Age of Onset   Diabetes Mother    Cancer Father        lymphoma   Social History:  reports that he has never smoked. He has never been exposed to tobacco smoke. He does not have any smokeless tobacco history on file. He reports that he does not drink alcohol and does not use drugs. Allergies  Allergen Reactions   Latex Hives  Lodine [Etodolac] Nausea And Vomiting   Prior to Admission medications   Medication Sig Start Date End Date Taking? Authorizing Provider  acetaminophen  (TYLENOL ) 325 MG tablet Take 650 mg by mouth every 6 (six) hours as needed for moderate pain. 06/06/20  Yes [provider]  albuterol  (VENTOLIN  HFA) 108 (90 Base) MCG/ACT inhaler Inhale 2 puffs into the lungs every 6 (six) hours as needed for wheezing. 09/14/23  Yes [provider]  aspirin  81 MG tablet Take 81 mg by mouth daily.   Yes [provider]  atorvastatin  (LIPITOR) 10 MG tablet Take 1 tablet (10 mg total) by mouth daily. Patient taking differently: Take 10 mg by mouth at bedtime. 08/06/12  Yes Cyrena Gwenn SQUIBB, MD  carvedilol  (COREG ) 6.25 MG tablet Take 6.25 mg by mouth 2 (two) times daily with a meal. 09/14/23  Yes [provider]  Cholecalciferol (VITAMIN D) 2000 UNITS CAPS Take 2,000 Units by mouth at bedtime.   Yes [provider]  cloNIDine  (CATAPRES ) 0.1 MG tablet Take 0.1 mg by mouth 2 (two) times daily. 05/16/22  Yes [provider]  diclofenac Sodium (VOLTAREN) 1 % GEL Apply 2 g topically daily as needed (pain).   Yes [provider]  EPINEPHrine  0.3 mg/0.3 mL IJ SOAJ injection Inject 0.3 mg into the muscle as needed for anaphylaxis. 09/14/23  Yes [provider]  fenofibrate  (TRICOR ) 145 MG tablet Take 1/2 tablet daily Patient taking differently: Take 72.5 mg by mouth every morning. 09/06/13  Yes Georgina Nancyann ORN, MD  furosemide  (LASIX ) 80 MG tablet Take 80 mg by mouth daily. 12/28/21  Yes [provider]  Glucosamine-Chondroit-Vit C-Mn (GLUCOSAMINE CHONDR 1500 COMPLX) CAPS Take 1 capsule by mouth 2 (two) times daily.   Yes [provider]  HUMALOG KWIKPEN 200 UNIT/ML KwikPen Inject 10-20 Units into the skin with breakfast, with lunch, and with evening meal. Sliding scale 07/19/21  Yes [provider]  hydrALAZINE  (APRESOLINE ) 100 MG tablet Take 100 mg by mouth 2 (two) times daily. 10/13/23  Yes [provider]  hydrochlorothiazide  (HYDRODIURIL ) 25 MG tablet Take 25 mg by mouth daily. 12/29/21  Yes [provider]  isosorbide  mononitrate (IMDUR ) 30 MG 24 hr tablet Take 30 mg by mouth at bedtime.   Yes [provider]  LANTUS  SOLOSTAR 100 UNIT/ML Solostar Pen Inject 50 Units into the skin at bedtime. 10/25/21  Yes [provider]  losartan  (COZAAR ) 100 MG tablet Take 100 mg by mouth at bedtime. 10/25/21  Yes [provider]  meclizine (ANTIVERT) 25 MG tablet Take 25 mg by mouth daily as needed for dizziness. 04/24/15  Yes [provider]  methocarbamol (ROBAXIN) 500 MG tablet Take 1 tablet by mouth daily as needed for muscle spasms. 10/14/19  Yes [provider]  Multiple Vitamin (MULTIVITAMIN) tablet Take 1 tablet by mouth daily.   Yes [provider]  sertraline (ZOLOFT) 50 MG tablet Take 50 mg by mouth daily as needed (mood). 12/30/21  Yes [provider]  sodium chloride  (OCEAN) 0.65 % SOLN nasal spray Place 1 spray into both nostrils daily as needed for congestion.   Yes [provider]  sucroferric oxyhydroxide (VELPHORO ) 500 MG chewable tablet Chew 500 mg by mouth 3 (three) times daily with meals. 05/26/22  Yes [provider]  valACYclovir (VALTREX) 1000 MG tablet Take 1,000 mg by mouth 2 (two) times daily. 01/10/17  Yes [provider]  zolpidem  (AMBIEN ) 10 MG tablet Take 1 tablet (10  mg total) by mouth at bedtime as needed for sleep. 01/11/13  Yes Cyrena Gwenn SQUIBB, MD   Current Facility-Administered Medications  Medication Dose Route Frequency Provider Last Rate Last Admin   acetaminophen  (TYLENOL ) tablet 650 mg  650 mg Oral Q6H PRN Jordan, Peter M, MD       Or   acetaminophen  (TYLENOL ) suppository 650 mg  650 mg Rectal Q6H PRN Jordan, Peter M, MD       albuterol  (PROVENTIL ) (2.5 MG/3ML) 0.083% nebulizer solution 2.5 mg  2.5 mg Nebulization Q4H PRN Jordan, Peter M, MD       [START ON 01/06/2024] aspirin  EC tablet 81 mg  81 mg Oral Daily Jordan, Peter M, MD       atorvastatin  (LIPITOR) tablet 80 mg  80 mg Oral Daily Jordan, Peter M, MD       bisacodyl  (DULCOLAX) EC tablet 5 mg  5 mg Oral Daily PRN Jordan, Peter M, MD       [START ON 01/06/2024] carvedilol  (COREG ) tablet 6.25 mg  6.25 mg Oral BID WC Jordan, Peter M, MD       NOREEN ON 01/06/2024] Chlorhexidine  Gluconate Cloth 2 % PADS 6 each  6 each Topical Q0600 Jordan, Peter M, MD        cloNIDine  (CATAPRES ) tablet 0.1 mg  0.1 mg Oral BID Jordan, Peter M, MD       [START ON 01/06/2024] fenofibrate  tablet 54 mg  54 mg Oral Daily Jordan, Peter M, MD       fentaNYL  (SUBLIMAZE ) injection 12.5 mcg  12.5 mcg Intravenous Q2H PRN Johnson, Clanford L, MD       heparin  ADULT infusion 100 units/mL (25000 units/250mL)  1,500 Units/hr Intravenous Continuous Chen, Lydia D, RPH       [START ON 01/06/2024] hydrALAZINE  (APRESOLINE ) tablet 100 mg  100 mg Oral BID Johnson, Clanford L, MD       [START ON 01/06/2024] hydrochlorothiazide  (HYDRODIURIL ) tablet 25 mg  25 mg Oral Daily Jordan, Peter M, MD       insulin  aspart (novoLOG ) injection 0-6 Units  0-6 Units Subcutaneous Q4H Jordan, Peter M, MD       insulin  glargine (LANTUS ) injection 40 Units  40 Units Subcutaneous QHS Johnson, Clanford L, MD       isosorbide  mononitrate (IMDUR ) 24 hr tablet 30 mg  30 mg Oral QHS Jordan, Peter M, MD       losartan  (COZAAR ) tablet 100 mg  100 mg Oral QHS Jordan, Peter M, MD       ondansetron  (ZOFRAN ) tablet 4 mg  4 mg Oral Q6H PRN Jordan, Peter M, MD       Or   ondansetron  (ZOFRAN ) injection 4 mg  4 mg Intravenous Q6H PRN Jordan, Peter M, MD       oxyCODONE  (Oxy IR/ROXICODONE ) immediate release tablet 5 mg  5 mg Oral Q6H PRN Jordan, Peter M, MD       [START ON 01/06/2024] sucroferric oxyhydroxide (VELPHORO ) chewable tablet 500 mg  500 mg Oral TID WC Jordan, Peter M, MD       zolpidem  (AMBIEN ) tablet 5 mg  5 mg Oral QHS PRN Jordan, Peter M, MD         ROS: As per HPI otherwise negative.  Physical Exam: Vitals:   01/05/24 1447 01/05/24 1452 01/05/24 1457 01/05/24 1520  BP: (!) 138/99 (!) 144/66 (!) 148/70 (!) 152/66  Pulse: (!) 59 66 65 (!) 59  Resp:  (!) 28 16 16  Temp:    (!) 97.4 F (36.3 C)  TempSrc:    Oral  SpO2: 97% 97% 97% 96%  Weight:      Height:         General: Lying bed, alert, nad  Head: NCAT sclera not icteric CV: RRR, no murmur, no rub  Pulm: normal respiratory effort, lungs clear   Abdomen: non-tender, no masses  Lower extremities: no edema or ischemic changes  Neuro: A & O X 3. Moves all extremities spontaneously. Psych:  Normal affect. Dialysis Access: R chest [facility]   Labs: Basic Metabolic Panel: Recent Labs  Lab 01/05/24 1018  NA 136  K 4.2  CL 100  CO2 26  GLUCOSE 306*  BUN 48*  CREATININE 5.29*  CALCIUM  8.9   Liver Function Tests: No results for input(s): AST, ALT, ALKPHOS, BILITOT, PROT, ALBUMIN  in the last 168 hours. No results for input(s): LIPASE, AMYLASE in the last 168 hours. No results for input(s): AMMONIA in the last 168 hours. CBC: Recent Labs  Lab 01/05/24 1018  WBC 12.2*  HGB 9.6*  HCT 30.0*  MCV 96.5  PLT 253   Cardiac Enzymes: No results for input(s): CKTOTAL, CKMB, CKMBINDEX, TROPONINI in the last 168 hours. CBG: No results for input(s): GLUCAP in the last 168 hours. Iron Studies:  Recent Labs    01/05/24 1210  IRON 36*  TIBC 298  FERRITIN 1,105*   Studies/Results: CARDIAC CATHETERIZATION Result Date: 01/05/2024   Ost LAD to Prox LAD lesion is 95% stenosed.   Mid Cx lesion is 50% stenosed.   Prox Cx lesion is 90% stenosed.   Prox RCA lesion is 70% stenosed.   Dist Cx lesion is 50% stenosed.   There is moderate to severe left ventricular systolic dysfunction.   LV end diastolic pressure is mildly elevated. Severe 3 vessel obstructive CAD Mod- severe LV dysfunction. EF estimated at 30-35% Mildly elevated LVEDP 19 mm Hg Plan: check Echo. CT surgery consult.   DG Chest Portable 1 View Result Date: 01/05/2024 CLINICAL DATA:  Three day history of chest pain EXAM: PORTABLE CHEST 1 VIEW COMPARISON:  Chest radiograph dated 10/21/2023 FINDINGS: Lines/tubes: Right internal jugular venous catheter tip projects over the superior cavoatrial junction. Lungs: Mildly low lung volumes. Diffusely increased interstitial opacities. No focal consolidations. Pleura: No pneumothorax or pleural effusion.  Heart/mediastinum: Increased enlargement of the cardiomediastinal silhouette. Bones: No acute osseous abnormality. IMPRESSION: Increased cardiomegaly and diffusely increased interstitial opacities, which may represent pulmonary edema. Electronically Signed   By: Limin  Xu M.D.   On: 01/05/2024 11:00    Dialysis Orders:  [facility] MWF 4:00 350/500 EDW 133.5 kg 2K/2.5 Ca TDC Heparin  2000 + 1400/hr  Calcitriol 1.25 three times per week Mircera 90 mcg q 2 wks (last 12/1)   Assessment/Plan: NSTEMI. LHC today with severe 3V CAD. Plans per cardiology  ESRD.  HD MWF. HD tonight.   Access. Using [facility]. H/o failed AVF x 2  Hypertension. BP acceptable. Continue home meds Volume. Does not appear grossly overloaded. Below EDW by weights here.  Anemia. Hgb 9.6. ESA recently dosed as outpatient  Metabolic bone disease.  Continue home binder  Nutrition. Renal diet with fluid restriction  DMT2. Insulin  per primary   Maisie Ronnald Acosta PA-C [facility] 01/05/2024, 4:06 PM

## 2024-01-05 NOTE — ED Triage Notes (Signed)
 Pt here from dialysis with c/o chest pain ongoing for 3 days , non radiating , some sob no n/v , received 324mg  asa from ems

## 2024-01-05 NOTE — Progress Notes (Signed)
 Pt receives out-pt HD [facility], MWF 1130. Will assist as needed.   Timothy Holloway  Dialysis Navigator [phone]

## 2024-01-05 NOTE — Hospital Course (Addendum)
 68 year old male with type 2 diabetes mellitus, obesity, hypertension, hyperlipidemia, depression, ESRD on dialysis (MWF) for last 1.5 years, anemia insomnia went to his dialysis treatment today and reported that he had a significant episode of chest pain/chest pressure where he said he felt like an elephant was sitting on his chest.  He was sitting and not active at the time.  He did not receive dialysis today because they decided to send him to the ED for further treatment.  Patient says that he had been having intermittent episodes of chest pressure for the last week that lasted about hour.  He had some shortness of breath with this episode.  When he arrived, he was chest pain-free.  He was noted to have a troponin of 1121 followed by 1343.  His glucose was 306.  He was started on an IV heparin  infusion.  He is on room air oxygen.  His chest x-ray showed some pulmonary edema.  His hemoglobin is 9.6.  He was seen by the inpatient cardiology service and they have recommended that he go directly to the Cath Lab today at [facility] and hospitalist called for admission.

## 2024-01-05 NOTE — Consult Note (Cosign Needed)
[address]       [place] [address]             [phone]        Timothy Holloway Date of Birth: 10-23-1955  Referring: Dr. Peter Jordan Primary Care: Baird Comer GAILS, NP  Reason for Consult: Evaluation for coronary artery bypass grafting after presenting with acute NSTEMI   History of Present Illness: Timothy Holloway is a 68 year old gentleman with past history of end-stage renal disease on hemodialysis for the past year and a half, type 2 diabetes mellitus, dyslipidemia, and obesity.  He developed chest pain and shortness of breath shortly after arriving for his dialysis treatment earlier today.  He was taken to the emergency room at [facility] by way of EMS for further evaluation.  He described having similar episodes several times over the past few days, each lasting about an hour and resolving without any particular intervention.  In the emergency room, his EKG showed normal sinus rhythm with first-degree AV block.  Chest x-ray was notable for cardiomegaly with interstitial opacities consistent with pulmonary edema.  Initial high-sensitivity troponin was 1100 and later rose to 1300.  Having ruled in for non-ST elevation myocardial infarction, he was started on aspirin , atorvastatin , and a heparin  infusion.  His chest pain resolved. He was transferred to [facility].  Cardiology consultation was provided by Dr. Deward Gull.  She recommended proceeding with left heart catheterization for further evaluation.  The study was conducted by Dr. Peter Jordan earlier today and shows severe three-vessel coronary artery disease with high-grade proximal stenoses in the LAD, circumflex, and RCA.  LVEDP was evaded at 19 and left ventricular ejection fraction was estimated at 30 to 35%. CT surgery has been asked to evaluate Timothy Holloway for consideration of coronary bypass grafting. Currently, Timothy Holloway is  resting in bed with his wife and sister-in-law at the bedside.  He denies any chest pain.    Current Activity/ Functional Status: Patient is independent with mobility/ambulation, transfers, ADL's, IADL's.   Zubrod Score: At the time of surgery this patient's most appropriate activity status/level should be described as: []     0    Normal activity, no symptoms []     1    Restricted in physical strenuous activity but ambulatory, able to do out light work []     2    Ambulatory and capable of self care, unable to do work activities, up and about                 more than 50%  Of the time                            []     3    Only limited self care, in bed greater than 50% of waking hours []     4    Completely disabled, no self care, confined to bed or chair []     5    Moribund  Past Medical History:  Diagnosis Date   Anemia    Cataract    Chronic kidney disease (CKD), stage III (moderate) (HCC)    dr Faythe   Depression    Diabetes mellitus without complication (HCC)    DVT (deep venous thrombosis) (HCC)    as a child   ESRD on dialysis (HCC)    M-W-F   Hyperlipidemia  Hypertension    Impotence    Insomnia    Obesity    Steal syndrome as complication of dialysis access     Past Surgical History:  Procedure Laterality Date   A/V FISTULAGRAM Right 10/11/2022   Procedure: A/V Fistulagram;  Surgeon: Serene Gaile ORN, MD;  Location: [facility] INVASIVE CV LAB;  Service: Cardiovascular;  Laterality: Right;   AV FISTULA PLACEMENT Left 01/11/2022   Procedure: LEFT ARM ARTERIOVENOUS (AV) FISTULA CREATION;  Surgeon: Lanis Fonda BRAVO, MD;  Location: [facility] OR;  Service: Vascular;  Laterality: Left;  PERIPHERAL NERVE BLOCK   AV FISTULA PLACEMENT Right 07/26/2022   Procedure: RIGHT UPPER EXTREMITY ARTERIOVENOUS (AV) FISTULA CREATION;  Surgeon: Oris Krystal FALCON, MD;  Location: [facility] ORS;  Service: Vascular;  Laterality: Right;   DIALYSIS/PERMA CATHETER INSERTION Right 10/23/2023   Procedure: DIALYSIS/PERMA  CATHETER INSERTION;  Surgeon: Magda Debby SAILOR, MD;  Location: [facility] PV LAB;  Service: Cardiovascular;  Laterality: Right;   INSERTION OF DIALYSIS CATHETER Right 06/16/2022   Procedure: INSERTION OF RIGHT iNTERNAL Jugular TUNNELED DIALYSIS CATHETER;  Surgeon: Serene Gaile ORN, MD;  Location: [facility] OR;  Service: Vascular;  Laterality: Right;   KNEE SURGERY Left 01-31-2002   LIGATION OF ARTERIOVENOUS  FISTULA Left 06/16/2022   Procedure: LEFT ARM FISTULA LIGATION;  Surgeon: Serene Gaile ORN, MD;  Location: [facility] OR;  Service: Vascular;  Laterality: Left;   LIGATION OF ARTERIOVENOUS  FISTULA Right 11/08/2022   Procedure: LIGATION of RIGHT RADIOCEPHALIC FISTULA;  Surgeon: Sheree Penne Bruckner, MD;  Location: [facility] OR;  Service: Vascular;  Laterality: Right;   SPINE SURGERY     fusion of L4-L5   UPPER EXTREMITY ANGIOGRAPHY Right 11/03/2022   Procedure: Upper Extremity Angiography;  Surgeon: Gretta Bruckner PARAS, MD;  Location: [facility] INVASIVE CV LAB;  Service: Cardiovascular;  Laterality: Right;    Social History   Tobacco Use  Smoking Status Never   Passive exposure: Never  Smokeless Tobacco Not on file    Social History   Substance and Sexual Activity  Alcohol Use No     Allergies  Allergen Reactions   Latex Hives   Lodine [Etodolac] Nausea And Vomiting    Current Facility-Administered Medications  Medication Dose Route Frequency Provider Last Rate Last Admin   acetaminophen  (TYLENOL ) tablet 650 mg  650 mg Oral Q6H PRN Johnson, Clanford L, MD       Or   acetaminophen  (TYLENOL ) suppository 650 mg  650 mg Rectal Q6H PRN Johnson, Clanford L, MD       albuterol  (PROVENTIL ) (2.5 MG/3ML) 0.083% nebulizer solution 2.5 mg  2.5 mg Nebulization Q4H PRN Johnson, Clanford L, MD       [START ON 01/06/2024] aspirin  EC tablet 81 mg  81 mg Oral Daily Johnson, Clanford L, MD       atorvastatin  (LIPITOR) tablet 80 mg  80 mg Oral Daily Ross, Paula V, MD       bisacodyl  (DULCOLAX) EC tablet 5 mg  5 mg Oral Daily  PRN Vicci, Clanford L, MD       [START ON 01/06/2024] carvedilol  (COREG ) tablet 6.25 mg  6.25 mg Oral BID WC Johnson, Clanford L, MD       [START ON 01/06/2024] Chlorhexidine  Gluconate Cloth 2 % PADS 6 each  6 each Topical Q0600 Ejigiri, Maisie Fellows, PA-C       cloNIDine  (CATAPRES ) tablet 0.1 mg  0.1 mg Oral BID Johnson, Clanford L, MD       [START ON 01/06/2024] fenofibrate  tablet 54 mg  54 mg Oral Daily Johnson, Clanford L, MD       fentaNYL  (SUBLIMAZE ) injection 12.5 mcg  12.5 mcg Intravenous Q2H PRN Johnson, Clanford L, MD       heparin  ADULT infusion 100 units/mL (25000 units/250mL)  1,500 Units/hr Intravenous Continuous Chen, Lydia D, [facility]       hydrALAZINE  (APRESOLINE ) tablet 100 mg  100 mg Oral BID Johnson, Clanford L, MD       [START ON 01/06/2024] hydrochlorothiazide  (HYDRODIURIL ) tablet 25 mg  25 mg Oral Daily Johnson, Clanford L, MD       insulin  aspart (novoLOG ) injection 0-6 Units  0-6 Units Subcutaneous Q4H Johnson, Clanford L, MD       insulin  glargine-yfgn (SEMGLEE ) injection 40 Units  40 Units Subcutaneous QHS Johnson, Clanford L, MD       isosorbide  mononitrate (IMDUR ) 24 hr tablet 30 mg  30 mg Oral QHS Johnson, Clanford L, MD       losartan  (COZAAR ) tablet 100 mg  100 mg Oral QHS Johnson, Clanford L, MD       ondansetron  (ZOFRAN ) tablet 4 mg  4 mg Oral Q6H PRN Johnson, Clanford L, MD       Or   ondansetron  (ZOFRAN ) injection 4 mg  4 mg Intravenous Q6H PRN Johnson, Clanford L, MD       oxyCODONE  (Oxy IR/ROXICODONE ) immediate release tablet 5 mg  5 mg Oral Q6H PRN Johnson, Clanford L, MD       [START ON 01/06/2024] sucroferric oxyhydroxide (VELPHORO ) chewable tablet 500 mg  500 mg Oral TID WC Johnson, Clanford L, MD       zolpidem  (AMBIEN ) tablet 5 mg  5 mg Oral QHS PRN Johnson, Clanford L, MD        Facility-Administered Medications Prior to Admission  Medication Dose Route Frequency Provider Last Rate Last Admin   0.9 %  sodium chloride  infusion  250 mL Intravenous PRN  Serene Gaile ORN, MD       0.9 %  sodium chloride  infusion  250 mL Intravenous PRN Gretta Lonni PARAS, MD       sodium chloride  flush (NS) 0.9 % injection 3 mL  3 mL Intravenous Q12H Serene Gaile ORN, MD       sodium chloride  flush (NS) 0.9 % injection 3 mL  3 mL Intravenous PRN Serene Gaile ORN, MD       sodium chloride  flush (NS) 0.9 % injection 3 mL  3 mL Intravenous Q12H Gretta Lonni PARAS, MD       Medications Prior to Admission  Medication Sig Dispense Refill Last Dose/Taking   acetaminophen  (TYLENOL ) 325 MG tablet Take 650 mg by mouth every 6 (six) hours as needed for moderate pain.   Unknown   albuterol  (VENTOLIN  HFA) 108 (90 Base) MCG/ACT inhaler Inhale 2 puffs into the lungs every 6 (six) hours as needed for wheezing.   Unknown   aspirin  81 MG tablet Take 81 mg by mouth daily.   01/05/2024 Morning   atorvastatin  (LIPITOR) 10 MG tablet Take 1 tablet (10 mg total) by mouth daily. (Patient taking differently: Take 10 mg by mouth at bedtime.) 30 tablet 11 01/04/2024 Bedtime   carvedilol  (COREG ) 6.25 MG tablet Take 6.25 mg by mouth 2 (two) times daily with a meal.   01/04/2024 Evening   Cholecalciferol (VITAMIN D) 2000 UNITS CAPS Take 2,000 Units by mouth at bedtime.   01/04/2024 Evening   cloNIDine  (CATAPRES ) 0.1 MG tablet Take 0.1 mg by mouth 2 (two) times  daily.   01/05/2024 Morning   diclofenac Sodium (VOLTAREN) 1 % GEL Apply 2 g topically daily as needed (pain).   Unknown   EPINEPHrine  0.3 mg/0.3 mL IJ SOAJ injection Inject 0.3 mg into the muscle as needed for anaphylaxis.   Unknown   fenofibrate  (TRICOR ) 145 MG tablet Take 1/2 tablet daily (Patient taking differently: Take 72.5 mg by mouth every morning.) 30 tablet 0 01/05/2024 Morning   furosemide  (LASIX ) 80 MG tablet Take 80 mg by mouth daily.   Unknown   Glucosamine-Chondroit-Vit C-Mn (GLUCOSAMINE CHONDR 1500 COMPLX) CAPS Take 1 capsule by mouth 2 (two) times daily.   01/05/2024 Morning   HUMALOG KWIKPEN 200 UNIT/ML KwikPen Inject  10-20 Units into the skin with breakfast, with lunch, and with evening meal. Sliding scale   01/05/2024 Morning   hydrALAZINE  (APRESOLINE ) 100 MG tablet Take 100 mg by mouth 2 (two) times daily.   01/04/2024 Evening   hydrochlorothiazide  (HYDRODIURIL ) 25 MG tablet Take 25 mg by mouth daily.   01/04/2024 Evening   isosorbide  mononitrate (IMDUR ) 30 MG 24 hr tablet Take 30 mg by mouth at bedtime.   01/04/2024 Bedtime   LANTUS  SOLOSTAR 100 UNIT/ML Solostar Pen Inject 50 Units into the skin at bedtime.   01/04/2024 Bedtime   losartan  (COZAAR ) 100 MG tablet Take 100 mg by mouth at bedtime.   01/04/2024 Bedtime   meclizine (ANTIVERT) 25 MG tablet Take 25 mg by mouth daily as needed for dizziness.   Past Week   methocarbamol (ROBAXIN) 500 MG tablet Take 1 tablet by mouth daily as needed for muscle spasms.   Past Week   Multiple Vitamin (MULTIVITAMIN) tablet Take 1 tablet by mouth daily.   01/05/2024 Morning   sertraline (ZOLOFT) 50 MG tablet Take 50 mg by mouth daily as needed (mood).   Unknown   sodium chloride  (OCEAN) 0.65 % SOLN nasal spray Place 1 spray into both nostrils daily as needed for congestion.   Unknown   sucroferric oxyhydroxide (VELPHORO ) 500 MG chewable tablet Chew 500 mg by mouth 3 (three) times daily with meals.   01/04/2024 Evening   valACYclovir (VALTREX) 1000 MG tablet Take 1,000 mg by mouth 2 (two) times daily.   Unknown   zolpidem  (AMBIEN ) 10 MG tablet Take 1 tablet (10 mg total) by mouth at bedtime as needed for sleep. 30 tablet 2 01/04/2024 Bedtime    Family History  Problem Relation Age of Onset   Diabetes Mother    Cancer Father        lymphoma     Review of Systems:       Cardiac Review of Systems: Y or  [    ]= no  Chest Pain [  x  ]  Resting SOB [   ] Exertional SOB  [ x ]  Orthopnea [  ]   Pedal Edema [   ]    Palpitations [  ] Syncope  [  ]   Presyncope [   ]  General Review of Systems: [Y] = yes [  ]=no Constitional: recent weight change [  ]; anorexia [  ]; fatigue [   ]; nausea [  ]; night sweats [  ]; fever [  ]; or chills [  ]  Dental: Last Dentist visit: Within the past year  Eye : blurred vision [  ]; diplopia [   ]; vision changes [  ];  Amaurosis fugax[  ]; Resp: cough [  ];  wheezing[  ];  hemoptysis[  ]; shortness of breath[x  ]; paroxysmal nocturnal dyspnea[  ]; dyspnea on exertion[  ]; or orthopnea[  ];  GI:  gallstones[  ], vomiting[  ];  dysphagia[  ]; melena[  ];  hematochezia [  ]; heartburn[  ];   Hx of  Colonoscopy[  ]; GU: kidney stones [  ]; hematuria[  ];   dysuria [  ];  nocturia[  ];  history of     obstruction [  ]; urinary frequency [  ]             Skin: rash, swelling[  ];, hair loss[  ];  peripheral edema[  ];  or itching[  ]; Musculosketetal: myalgias[  ];  joint swelling[  ];  joint erythema[  ];  joint pain[  ];  back pain[  ];  Heme/Lymph: bruising[  ];  bleeding[  ];  anemia[  ];  Neuro: TIA[  ];  headaches[  ];  stroke[  ];  vertigo[  ];  seizures[  ];   paresthesias[  ];  difficulty walking[  ];  Psych:depression[  ]; anxiety[  ];  Endocrine: diabetes[ x ];  thyroid dysfunction[  ];              Physical Exam: BP (!) 152/66 (BP Location: Left Arm)   Pulse (!) 59   Temp (!) 97.4 F (36.3 C) (Oral)   Resp 16   Ht 6' (1.829 m)   Wt 131.5 kg   SpO2 96%   BMI 39.33 kg/m    General appearance: Pleasant, obese 68 year old gentleman in no distress Head: Normocephalic, without obvious abnormality, atraumatic Neck: no adenopathy, no carotid bruit, no JVD, and supple, symmetrical, trachea midline Lymph nodes: No palpable cervical or clavicular adenopathy Resp: clear to auscultation bilaterally Cardio: Sinus bradycardia with good heart rate in the high 50s with first-degree AV block.  I did not hear a murmur although heart sounds are quite distant. 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  Otherwise no deformities, extremities are well-perfused.  There is no significant varicosities noted in the lower extremities.  He has had a attempted AV fistula in both upper extremities resulting in steal syndrome and those have been abandoned for dialysis.  He is currently being dialyzed by way of a tunneled catheter access in the right subclavian vein. Neurologic: Grossly normal  Diagnostic Studies & Laboratory data:    LEFT HEART CATH AND CORONARY ANGIOGRAPHY   Conclusion      Ost LAD to Prox LAD lesion is 95% stenosed.   Mid Cx lesion is 50% stenosed.   Prox Cx lesion is 90% stenosed.   Prox RCA lesion is 70% stenosed.   Dist Cx lesion is 50% stenosed.   There is moderate to severe left ventricular systolic dysfunction.   LV end diastolic pressure is mildly elevated.   Severe 3 vessel obstructive CAD Mod- severe LV dysfunction. EF estimated at 30-35% Mildly elevated LVEDP 19 mm Hg   Plan: check Echo. CT surgery consult.   Coronary Findings  Diagnostic Dominance: Right Left Anterior Descending  Ost LAD to Prox LAD lesion is 95% stenosed.  The lesion is severely calcified.    Left Circumflex  Prox Cx lesion is 90% stenosed. The lesion is severely calcified.  Mid Cx lesion is 50% stenosed.  Dist Cx lesion is 50% stenosed.    Right Coronary Artery  Prox RCA lesion is 70% stenosed. The lesion is calcified.    Intervention   No interventions have been documented.   Wall Motion              Left Heart  Left Ventricle The left ventricle is dilated. There is moderate to severe left ventricular systolic dysfunction. LV end diastolic pressure is mildly elevated. There are LV function abnormalities due to segmental dysfunction.   Coronary Diagrams  Diagnostic Dominance: Right     Recent Radiology Findings:  DG Chest Portable 1 View Result Date: 01/05/2024 CLINICAL DATA:  Three day history of chest pain EXAM:  PORTABLE CHEST 1 VIEW COMPARISON:  Chest radiograph dated 10/21/2023 FINDINGS: Lines/tubes: Right internal jugular venous catheter tip projects over the superior cavoatrial junction. Lungs: Mildly low lung volumes. Diffusely increased interstitial opacities. No focal consolidations. Pleura: No pneumothorax or pleural effusion. Heart/mediastinum: Increased enlargement of the cardiomediastinal silhouette. Bones: No acute osseous abnormality. IMPRESSION: Increased cardiomegaly and diffusely increased interstitial opacities, which may represent pulmonary edema. Electronically Signed   By: Limin  Xu M.D.   On: 01/05/2024 11:00     I have independently reviewed the above radiologic studies and discussed with the patient   Recent Lab Findings: Lab Results  Component Value Date   WBC 12.2 (H) 01/05/2024   HGB 9.6 (L) 01/05/2024   HCT 30.0 (L) 01/05/2024   PLT 253 01/05/2024   GLUCOSE 306 (H) 01/05/2024   CHOL 93 11/09/2012   TRIG 116 11/09/2012   HDL 35 (L) 11/09/2012   LDLCALC 35 11/09/2012   ALT 15 10/21/2023   AST 17 10/21/2023   NA 136 01/05/2024   K 4.2 01/05/2024   CL 100 01/05/2024   CREATININE 5.29 (H) 01/05/2024   BUN 48 (H) 01/05/2024   CO2 26 01/05/2024   TSH 2.190 01/05/2024   INR 1.1 11/11/2022   HGBA1C 8.0 (H) 07/22/2021      Assessment / Plan:      - Severe multivessel coronary artery disease in a 68 year old male presenting with acute non-ST elevation myocardial infarction.  Left ventricular ejection fraction is estimated at 30 to 35%.  He has diffuse interstitial opacities on his chest x-ray consistent with pulmonary edema.  Coronary bypass grafting is likely his best option for revascularization.  He is hemodynamically stable following left heart catheterization with plans to resume his heparin  infusion later this evening.  Dr. Shyrl will see Timothy Holloway and review clinical data.  Recommendations regarding surgery will follow.  -End-stage renal disease: On  hemodialysis for the past year and a half.  He dialyzes on Monday-Wednesday-Friday by way of a right tunneled catheter.  He did not complete treatment today due to new onset chest pain and has interstitial lung opacities consistent with pulmonary edema on chest x-ray.  He has been seen by nephrology with plans to proceed with dialysis later today.  - History of hypertension: Managed with carvedilol , hydralazine , Imdur , and losartan  prior to admission.  - Type 2 diabetes mellitus: He was managing this with both long and short acting insulins.   I  spent 25 minutes counseling the patient face to face.   Kaeleen Odom G. Anagabriela Jokerst, PA-C  01/05/2024 3:34 PM   Agree. 68 yo male with 3V CAD,  ESRD, and CHF.  No good PCI options.  Will plan for CABG early next week.  CABG 3.  LAD, OM, PDA  Caylee Vlachos O Tanisa Lagace

## 2024-01-05 NOTE — ED Provider Notes (Signed)
 [place] EMERGENCY DEPARTMENT AT [facility] Provider Note   CSN: [id number] Arrival date & time: 01/05/24  9040     Patient presents with: Chest Pain   Timothy Holloway is a 68 y.o. male.   68 year old male presents for evaluation of chest pain. It started when he got to dialysis. States it was a pressure and did not radiate. States he felt short of breath as well. It has resolved at this time. He denies any other symptoms or concerns. Dialysis gave him aspirin  and sent him here. He did not get dialyzed today but did go on Wednesday.    Chest Pain Associated symptoms: no abdominal pain, no back pain, no cough, no fever, no palpitations, no shortness of breath and no vomiting        Prior to Admission medications   Medication Sig Start Date End Date Taking? Authorizing Provider  acetaminophen  (TYLENOL ) 325 MG tablet Take 650 mg by mouth every 6 (six) hours as needed for moderate pain. 06/06/20  Yes [provider]  albuterol  (VENTOLIN  HFA) 108 (90 Base) MCG/ACT inhaler Inhale 2 puffs into the lungs every 6 (six) hours as needed for wheezing. 09/14/23  Yes [provider]  aspirin  81 MG tablet Take 81 mg by mouth daily.   Yes [provider]  atorvastatin  (LIPITOR) 10 MG tablet Take 1 tablet (10 mg total) by mouth daily. Patient taking differently: Take 10 mg by mouth at bedtime. 08/06/12  Yes Cyrena Gwenn SQUIBB, MD  carvedilol  (COREG ) 6.25 MG tablet Take 6.25 mg by mouth 2 (two) times daily with a meal. 09/14/23  Yes [provider]  Cholecalciferol (VITAMIN D) 2000 UNITS CAPS Take 2,000 Units by mouth at bedtime.   Yes [provider]  cloNIDine  (CATAPRES ) 0.1 MG tablet Take 0.1 mg by mouth 2 (two) times daily. 05/16/22  Yes [provider]  diclofenac Sodium (VOLTAREN) 1 % GEL Apply 2 g topically daily as needed (pain).   Yes [provider]  EPINEPHrine  0.3 mg/0.3 mL IJ SOAJ injection Inject 0.3 mg into the muscle  as needed for anaphylaxis. 09/14/23  Yes [provider]  fenofibrate  (TRICOR ) 145 MG tablet Take 1/2 tablet daily Patient taking differently: Take 72.5 mg by mouth every morning. 09/06/13  Yes Georgina Nancyann ORN, MD  furosemide  (LASIX ) 80 MG tablet Take 80 mg by mouth daily. 12/28/21  Yes [provider]  Glucosamine-Chondroit-Vit C-Mn (GLUCOSAMINE CHONDR 1500 COMPLX) CAPS Take 1 capsule by mouth 2 (two) times daily.   Yes [provider]  HUMALOG KWIKPEN 200 UNIT/ML KwikPen Inject 10-20 Units into the skin with breakfast, with lunch, and with evening meal. Sliding scale 07/19/21  Yes [provider]  hydrALAZINE  (APRESOLINE ) 100 MG tablet Take 100 mg by mouth 2 (two) times daily. 10/13/23  Yes [provider]  hydrochlorothiazide  (HYDRODIURIL ) 25 MG tablet Take 25 mg by mouth daily. 12/29/21  Yes [provider]  isosorbide  mononitrate (IMDUR ) 30 MG 24 hr tablet Take 30 mg by mouth at bedtime.   Yes [provider]  LANTUS  SOLOSTAR 100 UNIT/ML Solostar Pen Inject 50 Units into the skin at bedtime. 10/25/21  Yes [provider]  losartan  (COZAAR ) 100 MG tablet Take 100 mg by mouth at bedtime. 10/25/21  Yes [provider]  meclizine (ANTIVERT) 25 MG tablet Take 25 mg by mouth daily as needed for dizziness. 04/24/15  Yes [provider]  methocarbamol (ROBAXIN) 500 MG tablet Take 1 tablet by  mouth daily as needed for muscle spasms. 10/14/19  Yes [provider]  Multiple Vitamin (MULTIVITAMIN) tablet Take 1 tablet by mouth daily.   Yes [provider]  sertraline (ZOLOFT) 50 MG tablet Take 50 mg by mouth daily as needed (mood). 12/30/21  Yes [provider]  sodium chloride  (OCEAN) 0.65 % SOLN nasal spray Place 1 spray into both nostrils daily as needed for congestion.   Yes [provider]  sucroferric oxyhydroxide (VELPHORO ) 500 MG chewable tablet Chew 500 mg by mouth 3 (three) times  daily with meals. 05/26/22  Yes [provider]  valACYclovir (VALTREX) 1000 MG tablet Take 1,000 mg by mouth 2 (two) times daily. 01/10/17  Yes [provider]  zolpidem  (AMBIEN ) 10 MG tablet Take 1 tablet (10 mg total) by mouth at bedtime as needed for sleep. 01/11/13  Yes Cyrena Gwenn SQUIBB, MD    Allergies: Latex and Lodine [etodolac]    Review of Systems  Constitutional:  Negative for chills and fever.  HENT:  Negative for ear pain and sore throat.   Eyes:  Negative for pain and visual disturbance.  Respiratory:  Negative for cough and shortness of breath.   Cardiovascular:  Positive for chest pain. Negative for palpitations.  Gastrointestinal:  Negative for abdominal pain and vomiting.  Genitourinary:  Negative for dysuria and hematuria.  Musculoskeletal:  Negative for arthralgias and back pain.  Skin:  Negative for color change and rash.  Neurological:  Negative for seizures and syncope.  All other systems reviewed and are negative.   Updated Vital Signs BP (!) 152/66 (BP Location: Left Arm)   Pulse (!) 59   Temp (!) 97.4 F (36.3 C) (Oral)   Resp 16   Ht 6' (1.829 m)   Wt 131.5 kg   SpO2 96%   BMI 39.33 kg/m   Physical Exam Vitals and nursing note reviewed.  Constitutional:      General: He is not in acute distress.    Appearance: He is well-developed. He is obese.  HENT:     Head: Normocephalic and atraumatic.  Eyes:     Conjunctiva/sclera: Conjunctivae normal.  Cardiovascular:     Rate and Rhythm: Normal rate and regular rhythm.     Heart sounds: No murmur heard. Pulmonary:     Effort: Pulmonary effort is normal. No respiratory distress.     Breath sounds: Normal breath sounds.  Abdominal:     Palpations: Abdomen is soft.     Tenderness: There is no abdominal tenderness.  Musculoskeletal:        General: No swelling.     Cervical back: Neck supple.  Skin:    General: Skin is warm and dry.     Capillary Refill: Capillary refill takes less  than 2 seconds.  Neurological:     Mental Status: He is alert.  Psychiatric:        Mood and Affect: Mood normal.     (all labs ordered are listed, but only abnormal results are displayed) Labs Reviewed  BASIC METABOLIC PANEL WITH GFR - Abnormal; Notable for the following components:      Result Value   Glucose, Bld 306 (*)    BUN 48 (*)    Creatinine, Ser 5.29 (*)    GFR, Estimated 11 (*)    All other components within normal limits  CBC - Abnormal; Notable for the following components:   WBC 12.2 (*)    RBC 3.11 (*)    Hemoglobin 9.6 (*)  HCT 30.0 (*)    All other components within normal limits  IRON AND TIBC - Abnormal; Notable for the following components:   Iron 36 (*)    Saturation Ratios 12 (*)    All other components within normal limits  FERRITIN - Abnormal; Notable for the following components:   Ferritin 1,105 (*)    All other components within normal limits  RETICULOCYTES - Abnormal; Notable for the following components:   RBC. 3.12 (*)    Immature Retic Fract 32.3 (*)    All other components within normal limits  TROPONIN T, HIGH SENSITIVITY - Abnormal; Notable for the following components:   Troponin T High Sensitivity 1,121 (*)    All other components within normal limits  TROPONIN T, HIGH SENSITIVITY - Abnormal; Notable for the following components:   Troponin T High Sensitivity 1,343 (*)    All other components within normal limits  TSH  VITAMIN B12  HEMOGLOBIN A1C  HEPATITIS B SURFACE ANTIBODY, QUANTITATIVE  HEPATITIS B SURFACE ANTIGEN    EKG: EKG Interpretation Date/Time:  Friday January 05 2024 10:07:13 EST Ventricular Rate:  61 PR Interval:    QRS Duration:  126 QT Interval:  437 QTC Calculation: 441 R Axis:   -26  Text Interpretation: Atrial fibrillation Left bundle branch block Compared with prior EKG from 10/21/2023 Confirmed by Gennaro Bouchard ([id number]) on 01/05/2024 10:32:09 AM  Radiology: CARDIAC CATHETERIZATION Result Date:  01/05/2024   Ost LAD to Prox LAD lesion is 95% stenosed.   Mid Cx lesion is 50% stenosed.   Prox Cx lesion is 90% stenosed.   Prox RCA lesion is 70% stenosed.   Dist Cx lesion is 50% stenosed.   There is moderate to severe left ventricular systolic dysfunction.   LV end diastolic pressure is mildly elevated. Severe 3 vessel obstructive CAD Mod- severe LV dysfunction. EF estimated at 30-35% Mildly elevated LVEDP 19 mm Hg Plan: check Echo. CT surgery consult.   DG Chest Portable 1 View Result Date: 01/05/2024 CLINICAL DATA:  Three day history of chest pain EXAM: PORTABLE CHEST 1 VIEW COMPARISON:  Chest radiograph dated 10/21/2023 FINDINGS: Lines/tubes: Right internal jugular venous catheter tip projects over the superior cavoatrial junction. Lungs: Mildly low lung volumes. Diffusely increased interstitial opacities. No focal consolidations. Pleura: No pneumothorax or pleural effusion. Heart/mediastinum: Increased enlargement of the cardiomediastinal silhouette. Bones: No acute osseous abnormality. IMPRESSION: Increased cardiomegaly and diffusely increased interstitial opacities, which may represent pulmonary edema. Electronically Signed   By: Limin  Xu M.D.   On: 01/05/2024 11:00     .Critical Care  Performed by: Gennaro Bouchard CROME, DO Authorized by: Gennaro Bouchard CROME, DO   Critical care provider statement:    Critical care time (minutes):  40   Critical care time was exclusive of:  Separately billable procedures and treating other patients and teaching time   Critical care was necessary to treat or prevent imminent or life-threatening deterioration of the following conditions:  Cardiac failure   Critical care was time spent personally by me on the following activities:  Development of treatment plan with patient or surrogate, discussions with consultants, evaluation of patient's response to treatment, examination of patient, ordering and review of laboratory studies, ordering and review of radiographic  studies, ordering and performing treatments and interventions, pulse oximetry, re-evaluation of patient's condition and review of old charts   Care discussed with: admitting provider      Medications Ordered in the ED  atorvastatin  (LIPITOR) tablet 80 mg ( Oral MAR  Unhold 01/05/24 1512)  aspirin  EC tablet 81 mg ( Oral MAR Unhold 01/05/24 1512)  carvedilol  (COREG ) tablet 6.25 mg ( Oral MAR Unhold 01/05/24 1512)  cloNIDine  (CATAPRES ) tablet 0.1 mg ( Oral MAR Unhold 01/05/24 1512)  fenofibrate  tablet 54 mg ( Oral MAR Unhold 01/05/24 1512)  hydrALAZINE  (APRESOLINE ) tablet 100 mg ( Oral MAR Unhold 01/05/24 1512)  isosorbide  mononitrate (IMDUR ) 24 hr tablet 30 mg ( Oral MAR Unhold 01/05/24 1512)  hydrochlorothiazide  (HYDRODIURIL ) tablet 25 mg ( Oral MAR Unhold 01/05/24 1512)  losartan  (COZAAR ) tablet 100 mg ( Oral MAR Unhold 01/05/24 1512)  zolpidem  (AMBIEN ) tablet 5 mg ( Oral MAR Unhold 01/05/24 1512)  sucroferric oxyhydroxide (VELPHORO ) chewable tablet 500 mg ( Oral MAR Unhold 01/05/24 1512)  insulin  glargine-yfgn (SEMGLEE ) injection 40 Units ( Subcutaneous MAR Unhold 01/05/24 1512)  insulin  aspart (novoLOG ) injection 0-6 Units ( Subcutaneous MAR Unhold 01/05/24 1512)  acetaminophen  (TYLENOL ) tablet 650 mg ( Oral MAR Unhold 01/05/24 1512)    Or  acetaminophen  (TYLENOL ) suppository 650 mg ( Rectal MAR Unhold 01/05/24 1512)  oxyCODONE  (Oxy IR/ROXICODONE ) immediate release tablet 5 mg ( Oral MAR Unhold 01/05/24 1512)  fentaNYL  (SUBLIMAZE ) injection 12.5 mcg ( Intravenous MAR Unhold 01/05/24 1512)  bisacodyl  (DULCOLAX) EC tablet 5 mg ( Oral MAR Unhold 01/05/24 1512)  ondansetron  (ZOFRAN ) tablet 4 mg ( Oral MAR Unhold 01/05/24 1512)    Or  ondansetron  (ZOFRAN ) injection 4 mg ( Intravenous MAR Unhold 01/05/24 1512)  albuterol  (PROVENTIL ) (2.5 MG/3ML) 0.083% nebulizer solution 2.5 mg ( Nebulization MAR Unhold 01/05/24 1512)  Chlorhexidine  Gluconate Cloth 2 % PADS 6 each (has no administration in time range)  heparin   ADULT infusion 100 units/mL (25000 units/250mL) (has no administration in time range)  heparin  injection 4,000 Units (4,000 Units Intravenous Given 01/05/24 1156)                                    Medical Decision Making Cardiac monitor interpretation: Sinus bradycardia, no ectopy  Patient here for chest pain that happened before he got dialysis today.  He appears well and is chest pain-free on arrival.  Vitals are fairly stable.  First troponin is 1100 and he has no previous for comparison.  Was given aspirin  here and I will start him on a heparin  drip.  Discussed patient's case with cardiology and she will plan to have him transferred to [facility] to go to the Cath Lab.  I also spoke with hospitalist here and they will get in touch with renal to see if we can get him dialyzed after that today.  Hospitalist will plan to admit him to his service after cath.  Patient is agreeable with the plan.  Problems Addressed: NSTEMI (non-ST elevated myocardial infarction) [facility]): acute illness or injury that poses a threat to life or bodily functions  Amount and/or Complexity of Data Reviewed External Data Reviewed: notes.    Details: Prior ED records reviewed and patient with recent vascular access problem Labs: ordered. Decision-making details documented in ED Course.    Details: Ordered and reviewed by me and creatinine is baseline, however troponin is 1100 Radiology: ordered and independent interpretation performed. Decision-making details documented in ED Course.    Details: Interpreted me independently of radiology Chest x-ray: Shows no evidence of pulmonary edema ECG/medicine tests: ordered and independent interpretation performed. Decision-making details documented in ED Course.    Details: Ordered and inter printed by me in the absence  of cardiology and shows sinus rhythm, no evidence of STEMI Discussion of management or test interpretation with external provider(s): Dr. Ross-cardiology-I spoke with  her and she evaluated the patient will plan to transfer the patient to [facility] to go to the Cath Lab  Dr. Cathy will touch base with hospitalist at [facility] and patient will be admitted to hospital service after procedure.  He will also speak with nephrology to see if we can get the patient dialyzed later today  Risk OTC drugs. Prescription drug management. Drug therapy requiring intensive monitoring for toxicity. Decision regarding hospitalization. Minor surgery with identified risk factors.    Final diagnoses:  NSTEMI (non-ST elevated myocardial infarction) [facility])    ED Discharge Orders          Ordered    AMB referral to Phase II Cardiac Rehabilitation        01/05/24 1456               Gennaro Duwaine CROME, DO 01/05/24 1527

## 2024-01-06 ENCOUNTER — Other Ambulatory Visit: Payer: Self-pay

## 2024-01-06 ENCOUNTER — Inpatient Hospital Stay (HOSPITAL_COMMUNITY)

## 2024-01-06 LAB — CBC
HCT: 30.2 % — ABNORMAL LOW (ref 39.0–52.0)
Hemoglobin: 9.9 g/dL — ABNORMAL LOW (ref 13.0–17.0)
MCH: 30.7 pg (ref 26.0–34.0)
MCHC: 32.8 g/dL (ref 30.0–36.0)
MCV: 93.5 fL (ref 80.0–100.0)
Platelets: 280 K/uL (ref 150–400)
RBC: 3.23 MIL/uL — ABNORMAL LOW (ref 4.22–5.81)
RDW: 14.6 % (ref 11.5–15.5)
WBC: 12.6 K/uL — ABNORMAL HIGH (ref 4.0–10.5)
nRBC: 0 % (ref 0.0–0.2)

## 2024-01-06 LAB — GLUCOSE, CAPILLARY
Glucose-Capillary: 158 mg/dL — ABNORMAL HIGH (ref 70–99)
Glucose-Capillary: 282 mg/dL — ABNORMAL HIGH (ref 70–99)
Glucose-Capillary: 320 mg/dL — ABNORMAL HIGH (ref 70–99)
Glucose-Capillary: 363 mg/dL — ABNORMAL HIGH (ref 70–99)
Glucose-Capillary: 411 mg/dL — ABNORMAL HIGH (ref 70–99)

## 2024-01-06 LAB — URINALYSIS, COMPLETE (UACMP) WITH MICROSCOPIC
Bacteria, UA: NONE SEEN
Bilirubin Urine: NEGATIVE
Glucose, UA: >=500 mg/dL — AB
Hgb urine dipstick: NEGATIVE
Ketones, ur: NEGATIVE mg/dL
Leukocytes,Ua: NEGATIVE
Nitrite: NEGATIVE
Protein, ur: >=300 mg/dL — AB
Specific Gravity, Urine: 1.013 (ref 1.005–1.030)
pH: 7 (ref 5.0–8.0)

## 2024-01-06 LAB — RENAL FUNCTION PANEL
Albumin: 2.9 g/dL — ABNORMAL LOW (ref 3.5–5.0)
Anion gap: 11 (ref 5–15)
BUN: 29 mg/dL — ABNORMAL HIGH (ref 8–23)
CO2: 26 mmol/L (ref 22–32)
Calcium: 8.2 mg/dL — ABNORMAL LOW (ref 8.9–10.3)
Chloride: 97 mmol/L — ABNORMAL LOW (ref 98–111)
Creatinine, Ser: 4.02 mg/dL — ABNORMAL HIGH (ref 0.61–1.24)
GFR, Estimated: 15 mL/min — ABNORMAL LOW (ref >60)
Glucose, Bld: 194 mg/dL — ABNORMAL HIGH (ref 70–99)
Phosphorus: 3.1 mg/dL (ref 2.5–4.6)
Potassium: 3.9 mmol/L (ref 3.5–5.1)
Sodium: 134 mmol/L — ABNORMAL LOW (ref 135–145)

## 2024-01-06 LAB — SURGICAL PCR SCREEN
MRSA, PCR: NEGATIVE
Staphylococcus aureus: NEGATIVE

## 2024-01-06 LAB — MAGNESIUM: Magnesium: 1.7 mg/dL (ref 1.7–2.4)

## 2024-01-06 LAB — LIPID PANEL
Cholesterol: 88 mg/dL (ref 0–200)
HDL: 34 mg/dL — ABNORMAL LOW (ref >40)
LDL Cholesterol: 33 mg/dL (ref 0–99)
Total CHOL/HDL Ratio: 2.6 ratio
Triglycerides: 107 mg/dL (ref <150)
VLDL: 21 mg/dL (ref 0–40)

## 2024-01-06 LAB — HEPARIN LEVEL (UNFRACTIONATED)
Heparin Unfractionated: <0.1 [IU]/mL — ABNORMAL LOW (ref 0.30–0.70)
Heparin Unfractionated: 0.17 [IU]/mL — ABNORMAL LOW (ref 0.30–0.70)

## 2024-01-06 LAB — PREPARE RBC (CROSSMATCH)

## 2024-01-06 LAB — HEPATITIS B SURFACE ANTIBODY, QUANTITATIVE: Hep B S AB Quant (Post): 4.7 m[IU]/mL — ABNORMAL LOW

## 2024-01-06 MED ORDER — INSULIN ASPART 100 UNIT/ML IJ SOLN
6.0000 [IU] | Freq: Once | INTRAMUSCULAR | Status: AC
  Administered 2024-01-06: 6 [IU] via SUBCUTANEOUS
  Filled 2024-01-06: qty 6

## 2024-01-06 MED ORDER — HEPARIN BOLUS VIA INFUSION
2000.0000 [IU] | Freq: Once | INTRAVENOUS | Status: AC
  Administered 2024-01-06: 2000 [IU] via INTRAVENOUS
  Filled 2024-01-06: qty 2000

## 2024-01-06 NOTE — Progress Notes (Signed)
 Progress Note  Patient Name: Timothy Holloway Date of Encounter: 01/06/2024 Primary Cardiologist: Vina Gull, MD   Subjective   Overnight pending work up for CABG. Patient notes no CP, SOB, Palpitations.Didn't get out of HD until Monday  Vital Signs    Vitals:   01/06/24 0140 01/06/24 0229 01/06/24 0545 01/06/24 0847  BP: (!) 139/51  (!) 121/55 (!) 118/49  Pulse: 67  64 73  Resp: 17  17 17   Temp:  97.6 F (36.4 C) 98 F (36.7 C) 97.9 F (36.6 C)  TempSrc:  Oral Oral Oral  SpO2: 95%  92% (!) 89%  Weight:      Height:        Intake/Output Summary (Last 24 hours) at 01/06/2024 1129 Last data filed at 01/06/2024 1100 Gross per 24 hour  Intake 971.93 ml  Output 2600 ml  Net -1628.07 ml   Filed Weights   01/05/24 1011  Weight: 131.5 kg    Physical Exam   GEN: No acute distress.   Neck: No JVD Cardiac: RRR, no murmurs, rubs, or gallops.  Respiratory: Clear to auscultation bilaterally. GI: Soft, nontender, non-distended  MS: No edema  Labs  EKG: SR FAV Telemetry: SR FAV   Chemistry Recent Labs  Lab 01/05/24 1018 01/06/24 0417  NA 136 134*  K 4.2 3.9  CL 100 97*  CO2 26 26  GLUCOSE 306* 194*  BUN 48* 29*  CREATININE 5.29* 4.02*  CALCIUM  8.9 8.2*  ALBUMIN   --  2.9*  GFRNONAA 11* 15*  ANIONGAP 11 11     Hematology Recent Labs  Lab 01/05/24 1018 01/05/24 1210 01/06/24 0417  WBC 12.2*  --  12.6*  RBC 3.11* 3.12* 3.23*  HGB 9.6*  --  9.9*  HCT 30.0*  --  30.2*  MCV 96.5  --  93.5  MCH 30.9  --  30.7  MCHC 32.0  --  32.8  RDW 14.5  --  14.6  PLT 253  --  280    Cardiac Enzymes  Recent Labs  Lab 01/05/24 1018 01/05/24 1210  TRNPT 1,121* 1,343*      Cardiac Studies   Cardiac Studies & Procedures   ______________________________________________________________________________________________ CARDIAC CATHETERIZATION  CARDIAC CATHETERIZATION 01/05/2024  Conclusion   Ost LAD to Prox LAD lesion is 95% stenosed.   Mid Cx lesion is  50% stenosed.   Prox Cx lesion is 90% stenosed.   Prox RCA lesion is 70% stenosed.   Dist Cx lesion is 50% stenosed.   There is moderate to severe left ventricular systolic dysfunction.   LV end diastolic pressure is mildly elevated.  Severe 3 vessel obstructive CAD Mod- severe LV dysfunction. EF estimated at 30-35% Mildly elevated LVEDP 19 mm Hg  Plan: check Echo. CT surgery consult.  Findings Coronary Findings Diagnostic  Dominance: Right  Left Anterior Descending Ost LAD to Prox LAD lesion is 95% stenosed. The lesion is severely calcified.  Left Circumflex Prox Cx lesion is 90% stenosed. The lesion is severely calcified. Mid Cx lesion is 50% stenosed. Dist Cx lesion is 50% stenosed.  Right Coronary Artery Prox RCA lesion is 70% stenosed. The lesion is calcified.  Intervention  No interventions have been documented.     ECHOCARDIOGRAM  ECHOCARDIOGRAM COMPLETE 01/05/2024  Narrative ECHOCARDIOGRAM REPORT    Patient Name:   Timothy Holloway Date of Exam: 01/05/2024 Medical Rec #:  [id number]           Height:       72.0  in Accession #:    [id number]          Weight:       290.0 lb Date of Birth:  1955-02-11            BSA:          2.495 m Patient Age:    68 years            BP:           131/60 mmHg Patient Gender: M                   HR:           55 bpm. Exam Location:  Inpatient  Procedure: 2D Echo, Cardiac Doppler and Color Doppler (Both Spectral and Color Flow Doppler were utilized during procedure).  Indications:    CAD  History:        Patient has no prior history of Echocardiogram examinations. Previous Myocardial Infarction, Signs/Symptoms:Chest Pain and Edema; Risk Factors:Hypertension, Dyslipidemia and Diabetes.  Sonographer:    Juliene Rucks Referring Phys: [id number] HARRELL O LIGHTFOOT   Sonographer Comments: Patient is obese. IMPRESSIONS   1. Extremely limited; there appears to be hypokinesis of the distal anteroseptal wall and apex with  overall mild to moderate LV dysfunction. 2. Left ventricular ejection fraction, by estimation, is 40 to 45%. The left ventricle has mildly decreased function. The left ventricle demonstrates regional wall motion abnormalities (see scoring diagram/findings for description). The left ventricular internal cavity size was mildly dilated. There is moderate left ventricular hypertrophy. Left ventricular diastolic parameters are indeterminate. 3. Right ventricular systolic function is normal. The right ventricular size is normal. 4. The mitral valve is normal in structure. Trivial mitral valve regurgitation. No evidence of mitral stenosis. Severe mitral annular calcification. 5. The aortic valve is tricuspid. Aortic valve regurgitation is not visualized. Aortic valve sclerosis/calcification is present, without any evidence of aortic stenosis. 6. The inferior vena cava is dilated in size with >50% respiratory variability, suggesting right atrial pressure of 8 mmHg.  Comparison(s): No prior Echocardiogram.  FINDINGS Left Ventricle: Left ventricular ejection fraction, by estimation, is 40 to 45%. The left ventricle has mildly decreased function. The left ventricle demonstrates regional wall motion abnormalities. The left ventricular internal cavity size was mildly dilated. There is moderate left ventricular hypertrophy. Left ventricular diastolic function could not be evaluated due to mitral annular calcification (moderate or greater). Left ventricular diastolic parameters are indeterminate.  Right Ventricle: The right ventricular size is normal. Right ventricular systolic function is normal.  Left Atrium: Left atrial size was normal in size.  Right Atrium: Right atrial size was normal in size.  Pericardium: Trivial pericardial effusion is present.  Mitral Valve: The mitral valve is normal in structure. Severe mitral annular calcification. Trivial mitral valve regurgitation. No evidence of mitral valve  stenosis.  Tricuspid Valve: The tricuspid valve is normal in structure. Tricuspid valve regurgitation is not demonstrated. No evidence of tricuspid stenosis.  Aortic Valve: The aortic valve is tricuspid. Aortic valve regurgitation is not visualized. Aortic valve sclerosis/calcification is present, without any evidence of aortic stenosis.  Pulmonic Valve: The pulmonic valve was not well visualized. Pulmonic valve regurgitation is not visualized. No evidence of pulmonic stenosis.  Aorta: The aortic root is normal in size and structure.  Venous: The inferior vena cava is dilated in size with greater than 50% respiratory variability, suggesting right atrial pressure of 8 mmHg.  IAS/Shunts: The interatrial septum was not well visualized.  Additional Comments: Extremely limited; there appears to be hypokinesis of the distal anteroseptal wall and apex with overall mild to moderate LV dysfunction.   LEFT VENTRICLE PLAX 2D LVIDd:         5.60 cm   Diastology LVIDs:         4.40 cm   LV e' medial:  5.87 cm/s LV PW:         1.40 cm   LV e' lateral: 4.73 cm/s LV IVS:        1.40 cm LVOT diam:     2.20 cm LV SV:         76 LV SV Index:   30 LVOT Area:     3.80 cm   RIGHT VENTRICLE          IVC RV Basal diam:  3.70 cm  IVC diam: 2.70 cm RV Mid diam:    3.60 cm  LEFT ATRIUM           Index        RIGHT ATRIUM           Index LA diam:      4.60 cm 1.84 cm/m   RA Area:     15.20 cm LA Vol (A4C): 67.4 ml 27.01 ml/m  RA Volume:   37.10 ml  14.87 ml/m AORTIC VALVE LVOT Vmax:   82.80 cm/s LVOT Vmean:  52.900 cm/s LVOT VTI:    0.199 m  AORTA Ao Root diam: 3.50 cm   SHUNTS Systemic VTI:  0.20 m Systemic Diam: 2.20 cm  Redell Shallow MD Electronically signed by Redell Shallow MD Signature Date/Time: 01/05/2024/6:09:09 PM    Final          ______________________________________________________________________________________________           Assessment & Plan    NSTEMI - on ASA and heparin  and statin - on BB with FAV; would not increase  HTN with DM - controlled on current therapy  HLD - LDL < 55  ESRD - as per Nephrology; has anemia but stable on [facility]  Planned for CABG on Monday; we will be available on 01/07/24 or post CABG for any peri-operative concerns.   For questions or updates, please contact [facility] Please consult [email or web address] for contact info under Cardiology/STEMI.      Stanly Leavens, MD FASE [facility] Cardiologist [facility]  [address] [place], [place] [address] [phone]  11:29 AM

## 2024-01-06 NOTE — Progress Notes (Signed)
   01/06/24 0140  Vitals  BP (!) 139/51  Pulse Rate 67  ECG Heart Rate 68  Resp 17  Oxygen Therapy  SpO2 95 %  O2 Device Room Air  During Treatment Monitoring  Blood Flow Rate (mL/min) 0 mL/min  Arterial Pressure (mmHg) -42.42 mmHg  Venous Pressure (mmHg) 47.47 mmHg  TMP (mmHg) 12.52 mmHg  Ultrafiltration Rate (mL/min) 914 mL/min  Dialysate Flow Rate (mL/min) 299 ml/min  Dialysate Potassium Concentration 3  Dialysate Calcium  Concentration 2.5  Duration of HD Treatment -hour(s) 3.5 hour(s)  Cumulative Fluid Removed (mL) per Treatment  2000.17  Intra-Hemodialysis Comments Tx completed  Post Treatment  Dialyzer Clearance Heavily streaked  Liters Processed 73.5  Fluid Removed (mL) 2000 mL  Tolerated HD Treatment Yes  Hemodialysis Catheter Right Internal jugular Double lumen Permanent (Tunneled)  Placement Date/Time: 10/23/23 0931   Placed prior to admission: No  Serial / Lot #: [id number]  Expiration Date: 06/30/28  Time Out: Correct patient;Correct site;Correct procedure  Maximum sterile barrier precautions: Hand hygiene;Large sterile sheet;C...  Site Condition No complications  Blue Lumen Status Flushed;Heparin  locked  Red Lumen Status Flushed;Heparin  locked  Catheter fill solution Heparin  1000 units/ml  Catheter fill volume (Arterial) 1.6 cc  Catheter fill volume (Venous) 1.6  Dressing Type Transparent  Dressing Status Antimicrobial disc/dressing in place;Clean, Dry, Intact  Interventions New dressing  Drainage Description None  Dressing Change Due 01/12/24  Post treatment catheter status Capped and Clamped

## 2024-01-06 NOTE — Plan of Care (Signed)
   Problem: Clinical Measurements: Goal: Ability to maintain clinical measurements within normal limits will improve Outcome: Progressing Goal: Diagnostic test results will improve Outcome: Progressing Goal: Respiratory complications will improve Outcome: Progressing Goal: Cardiovascular complication will be avoided Outcome: Progressing

## 2024-01-06 NOTE — Progress Notes (Signed)
 PROGRESS NOTE    Timothy Holloway  FMW:[id number] DOB: 09-12-1955 DOA: 01/05/2024 PCP: Baird Comer GAILS, NP    Brief Narrative:  68 year old ESRD on hemodialysis, type 2 diabetes, hypertension, obesity, chronic anemia developed precordial chest pain and pressure sensation while receiving dialysis.  Brought to the emergency room.  Troponins were Elevated.  Started on heparin  infusion and transferred to [facility] for cardiac cath. Underwent cardiac cath, found to have triple-vessel disease and congestive heart failure.  Subjective: Patient seen and examined.  Denies any complaints.  Denied any chest pain overnight. Received dialysis overnight, tired with transportation and unable to have sleep.   Assessment & Plan:   Coronary artery disease, non-STEMI: New onset. Cardiac cath 12/5, triple-vessel disease Ejection fraction 40 to 45% Currently no active chest pain Aspirin , Lipitor, carvedilol , fenofibrate , Imdur .  Cardiology following.  CT surgery following to see whether patient is appropriate candidate for CABG.  ESRD on hemodialysis, received dialysis overnight.  Stable.  Nephrology seeing.  Type 2 diabetes on insulin : Well-controlled.  Resumed home dose of insulin .  Hypertension: Blood pressure stable on current regimen carvedilol  and Imdur .    DVT prophylaxis: SCDs   Code Status: Full code Family Communication: None at bedside Disposition Plan: Status is: Inpatient Remains inpatient appropriate because: Active treatment, anticipate surgery     Consultants:  Cardiology Nephrology Cardiothoracic surgery  Procedures:  Cardiac cath 12/5  Antimicrobials:  None     Objective: Vitals:   01/06/24 0140 01/06/24 0229 01/06/24 0545 01/06/24 0847  BP: (!) 139/51  (!) 121/55 (!) 118/49  Pulse: 67  64 73  Resp: 17  17 17   Temp:  97.6 F (36.4 C) 98 F (36.7 C) 97.9 F (36.6 C)  TempSrc:  Oral Oral Oral  SpO2: 95%  92% (!) 89%  Weight:      Height:         Intake/Output Summary (Last 24 hours) at 01/06/2024 1001 Last data filed at 01/06/2024 0800 Gross per 24 hour  Intake 686.88 ml  Output 2600 ml  Net -1913.12 ml   Filed Weights   01/05/24 1011  Weight: 131.5 kg    Examination:  General exam: Appears calm and comfortable.  Pleasant and interactive. Respiratory system: Clear to auscultation. Respiratory effort normal. Right IJ permacath present. Cardiovascular system: S1 & S2 heard, RRR.  Gastrointestinal system: Abdomen is nondistended, soft and nontender. No organomegaly or masses felt. Normal bowel sounds heard. Central nervous system: Alert and oriented. No focal neurological deficits. Extremities: Symmetric 5 x 5 power. Skin: No rashes, lesions or ulcers    Data Reviewed: I have personally reviewed following labs and imaging studies  CBC: Recent Labs  Lab 01/05/24 1018 01/06/24 0417  WBC 12.2* 12.6*  HGB 9.6* 9.9*  HCT 30.0* 30.2*  MCV 96.5 93.5  PLT 253 280   Basic Metabolic Panel: Recent Labs  Lab 01/05/24 1018 01/06/24 0417  NA 136 134*  K 4.2 3.9  CL 100 97*  CO2 26 26  GLUCOSE 306* 194*  BUN 48* 29*  CREATININE 5.29* 4.02*  CALCIUM  8.9 8.2*  MG  --  1.7  PHOS  --  3.1   GFR: Estimated Creatinine Clearance: 24 mL/min (A) (by C-G formula based on SCr of 4.02 mg/dL (H)). Liver Function Tests: Recent Labs  Lab 01/06/24 0417  ALBUMIN  2.9*   No results for input(s): LIPASE, AMYLASE in the last 168 hours. No results for input(s): AMMONIA in the last 168 hours. Coagulation Profile: No results  for input(s): INR, PROTIME in the last 168 hours. Cardiac Enzymes: No results for input(s): CKTOTAL, CKMB, CKMBINDEX, TROPONINI in the last 168 hours. BNP (last 3 results) No results for input(s): PROBNP in the last 8760 hours. HbA1C: Recent Labs    01/05/24 1210  HGBA1C 9.9*   CBG: Recent Labs  Lab 01/05/24 1740 01/06/24 0237 01/06/24 0846  GLUCAP 207* 158* 282*    Lipid Profile: Recent Labs    01/06/24 0417  CHOL 88  HDL 34*  LDLCALC 33  TRIG 892  CHOLHDL 2.6   Thyroid Function Tests: Recent Labs    01/05/24 1210  TSH 2.190   Anemia Panel: Recent Labs    01/05/24 1210  VITAMINB12 819  FERRITIN 1,105*  TIBC 298  IRON 36*  RETICCTPCT 1.7   Sepsis Labs: No results for input(s): PROCALCITON, LATICACIDVEN in the last 168 hours.  No results found for this or any previous visit (from the past 240 hours).       Radiology Studies: ECHOCARDIOGRAM COMPLETE Result Date: 01/05/2024    ECHOCARDIOGRAM REPORT   Patient Name:   Timothy Holloway Date of Exam: 01/05/2024 Medical Rec #:  [id number]           Height:       72.0 in Accession #:    [id number]          Weight:       290.0 lb Date of Birth:  [birth year]            BSA:          2.495 m Patient Age:    68 years            BP:           131/60 mmHg Patient Gender: M                   HR:           55 bpm. Exam Location:  Inpatient Procedure: 2D Echo, Cardiac Doppler and Color Doppler (Both Spectral and Color            Flow Doppler were utilized during procedure). Indications:    CAD  History:        Patient has no prior history of Echocardiogram examinations.                 Previous Myocardial Infarction, Signs/Symptoms:Chest Pain and                 Edema; Risk Factors:Hypertension, Dyslipidemia and Diabetes.  Sonographer:    Juliene Rucks Referring Phys: [id number] HARRELL O LIGHTFOOT  Sonographer Comments: Patient is obese. IMPRESSIONS  1. Extremely limited; there appears to be hypokinesis of the distal anteroseptal wall and apex with overall mild to moderate LV dysfunction.  2. Left ventricular ejection fraction, by estimation, is 40 to 45%. The left ventricle has mildly decreased function. The left ventricle demonstrates regional wall motion abnormalities (see scoring diagram/findings for description). The left ventricular  internal cavity size was mildly dilated. There is moderate left  ventricular hypertrophy. Left ventricular diastolic parameters are indeterminate.  3. Right ventricular systolic function is normal. The right ventricular size is normal.  4. The mitral valve is normal in structure. Trivial mitral valve regurgitation. No evidence of mitral stenosis. Severe mitral annular calcification.  5. The aortic valve is tricuspid. Aortic valve regurgitation is not visualized. Aortic valve sclerosis/calcification is present, without any evidence of aortic stenosis.  6. The inferior vena cava  is dilated in size with >50% respiratory variability, suggesting right atrial pressure of 8 mmHg. Comparison(s): No prior Echocardiogram. FINDINGS  Left Ventricle: Left ventricular ejection fraction, by estimation, is 40 to 45%. The left ventricle has mildly decreased function. The left ventricle demonstrates regional wall motion abnormalities. The left ventricular internal cavity size was mildly dilated. There is moderate left ventricular hypertrophy. Left ventricular diastolic function could not be evaluated due to mitral annular calcification (moderate or greater). Left ventricular diastolic parameters are indeterminate. Right Ventricle: The right ventricular size is normal. Right ventricular systolic function is normal. Left Atrium: Left atrial size was normal in size. Right Atrium: Right atrial size was normal in size. Pericardium: Trivial pericardial effusion is present. Mitral Valve: The mitral valve is normal in structure. Severe mitral annular calcification. Trivial mitral valve regurgitation. No evidence of mitral valve stenosis. Tricuspid Valve: The tricuspid valve is normal in structure. Tricuspid valve regurgitation is not demonstrated. No evidence of tricuspid stenosis. Aortic Valve: The aortic valve is tricuspid. Aortic valve regurgitation is not visualized. Aortic valve sclerosis/calcification is present, without any evidence of aortic stenosis. Pulmonic Valve: The pulmonic valve was not  well visualized. Pulmonic valve regurgitation is not visualized. No evidence of pulmonic stenosis. Aorta: The aortic root is normal in size and structure. Venous: The inferior vena cava is dilated in size with greater than 50% respiratory variability, suggesting right atrial pressure of 8 mmHg. IAS/Shunts: The interatrial septum was not well visualized. Additional Comments: Extremely limited; there appears to be hypokinesis of the distal anteroseptal wall and apex with overall mild to moderate LV dysfunction.  LEFT VENTRICLE PLAX 2D LVIDd:         5.60 cm   Diastology LVIDs:         4.40 cm   LV e' medial:  5.87 cm/s LV PW:         1.40 cm   LV e' lateral: 4.73 cm/s LV IVS:        1.40 cm LVOT diam:     2.20 cm LV SV:         76 LV SV Index:   30 LVOT Area:     3.80 cm  RIGHT VENTRICLE          IVC RV Basal diam:  3.70 cm  IVC diam: 2.70 cm RV Mid diam:    3.60 cm LEFT ATRIUM           Index        RIGHT ATRIUM           Index LA diam:      4.60 cm 1.84 cm/m   RA Area:     15.20 cm LA Vol (A4C): 67.4 ml 27.01 ml/m  RA Volume:   37.10 ml  14.87 ml/m  AORTIC VALVE LVOT Vmax:   82.80 cm/s LVOT Vmean:  52.900 cm/s LVOT VTI:    0.199 m  AORTA Ao Root diam: 3.50 cm  SHUNTS Systemic VTI:  0.20 m Systemic Diam: 2.20 cm Redell Shallow MD Electronically signed by Redell Shallow MD Signature Date/Time: 01/05/2024/6:09:09 PM    Final    CARDIAC CATHETERIZATION Result Date: 01/05/2024   Ost LAD to Prox LAD lesion is 95% stenosed.   Mid Cx lesion is 50% stenosed.   Prox Cx lesion is 90% stenosed.   Prox RCA lesion is 70% stenosed.   Dist Cx lesion is 50% stenosed.   There is moderate to severe left ventricular systolic dysfunction.   LV end  diastolic pressure is mildly elevated. Severe 3 vessel obstructive CAD Mod- severe LV dysfunction. EF estimated at 30-35% Mildly elevated LVEDP 19 mm Hg Plan: check Echo. CT surgery consult.   DG Chest Portable 1 View Result Date: 01/05/2024 CLINICAL DATA:  Three day history of chest  pain EXAM: PORTABLE CHEST 1 VIEW COMPARISON:  Chest radiograph dated 10/21/2023 FINDINGS: Lines/tubes: Right internal jugular venous catheter tip projects over the superior cavoatrial junction. Lungs: Mildly low lung volumes. Diffusely increased interstitial opacities. No focal consolidations. Pleura: No pneumothorax or pleural effusion. Heart/mediastinum: Increased enlargement of the cardiomediastinal silhouette. Bones: No acute osseous abnormality. IMPRESSION: Increased cardiomegaly and diffusely increased interstitial opacities, which may represent pulmonary edema. Electronically Signed   By: Limin  Xu M.D.   On: 01/05/2024 11:00        Scheduled Meds:  aspirin  EC  81 mg Oral Daily   atorvastatin   80 mg Oral Daily   carvedilol   6.25 mg Oral BID WC   Chlorhexidine  Gluconate Cloth  6 each Topical Q0600   cloNIDine   0.1 mg Oral BID   fenofibrate   54 mg Oral Daily   hydrALAZINE   100 mg Oral BID   hydrochlorothiazide   25 mg Oral Daily   insulin  aspart  0-6 Units Subcutaneous TID WC   insulin  glargine  40 Units Subcutaneous QHS   isosorbide  mononitrate  30 mg Oral QHS   losartan   100 mg Oral QHS   sodium chloride  flush  3 mL Intravenous Q12H   sucroferric oxyhydroxide  500 mg Oral TID WC   Continuous Infusions:  sodium chloride      heparin  1,500 Units/hr (01/06/24 0800)     LOS: 1 day       Sheikh Leverich, MD [facility]

## 2024-01-06 NOTE — Progress Notes (Signed)
 [facility] Progress Note   Subjective:   Patient seen and examined at bedside.  Sitting up finishing breakfast.  Reports feeling better but not 100%.  Denies chest pain, palpitations, nausea, vomiting, abdominal pain, and diarrhea.  Tolerated dialysis well overnight, states I just got back here so late.  Tired this AM.  No other complaints.   Objective Vitals:   01/06/24 0140 01/06/24 0229 01/06/24 0545 01/06/24 0847  BP: (!) 139/51  (!) 121/55 (!) 118/49  Pulse: 67  64 73  Resp: 17  17 17   Temp:  97.6 F (36.4 C) 98 F (36.7 C) 97.9 F (36.6 C)  TempSrc:  Oral Oral Oral  SpO2: 95%  92% (!) 89%  Weight:      Height:       Physical Exam General:Alert male in NAD Heart:RRR Lungs:CTAB, nml WOB on RA Abdomen:soft, NTND Extremities:no LE edema Dialysis Access: [facility]   Filed Weights   01/05/24 1011  Weight: 131.5 kg    Intake/Output Summary (Last 24 hours) at 01/06/2024 0955 Last data filed at 01/06/2024 0800 Gross per 24 hour  Intake 686.88 ml  Output 2600 ml  Net -1913.12 ml    Additional Objective Labs: Basic Metabolic Panel: Recent Labs  Lab 01/05/24 1018 01/06/24 0417  NA 136 134*  K 4.2 3.9  CL 100 97*  CO2 26 26  GLUCOSE 306* 194*  BUN 48* 29*  CREATININE 5.29* 4.02*  CALCIUM  8.9 8.2*  PHOS  --  3.1   Liver Function Tests: Recent Labs  Lab 01/06/24 0417  ALBUMIN  2.9*    CBC: Recent Labs  Lab 01/05/24 1018 01/06/24 0417  WBC 12.2* 12.6*  HGB 9.6* 9.9*  HCT 30.0* 30.2*  MCV 96.5 93.5  PLT 253 280    CBG: Recent Labs  Lab 01/05/24 1740 01/06/24 0237 01/06/24 0846  GLUCAP 207* 158* 282*   Iron Studies:  Recent Labs    01/05/24 1210  IRON 36*  TIBC 298  FERRITIN 1,105*   Lab Results  Component Value Date   INR 1.1 11/11/2022   INR 1.05 04/06/2009   Studies/Results: ECHOCARDIOGRAM COMPLETE Result Date: 01/05/2024    ECHOCARDIOGRAM REPORT   Patient Name:   Timothy Holloway Date of Exam: 01/05/2024 Medical  Rec #:  [id number]           Height:       72.0 in Accession #:    [id number]          Weight:       290.0 lb Date of Birth:  09/28/1955            BSA:          2.495 m Patient Age:    68 years            BP:           131/60 mmHg Patient Gender: M                   HR:           55 bpm. Exam Location:  Inpatient Procedure: 2D Echo, Cardiac Doppler and Color Doppler (Both Spectral and Color            Flow Doppler were utilized during procedure). Indications:    CAD  History:        Patient has no prior history of Echocardiogram examinations.  Previous Myocardial Infarction, Signs/Symptoms:Chest Pain and                 Edema; Risk Factors:Hypertension, Dyslipidemia and Diabetes.  Sonographer:    Juliene Rucks Referring Phys: [id number] HARRELL O LIGHTFOOT  Sonographer Comments: Patient is obese. IMPRESSIONS  1. Extremely limited; there appears to be hypokinesis of the distal anteroseptal wall and apex with overall mild to moderate LV dysfunction.  2. Left ventricular ejection fraction, by estimation, is 40 to 45%. The left ventricle has mildly decreased function. The left ventricle demonstrates regional wall motion abnormalities (see scoring diagram/findings for description). The left ventricular  internal cavity size was mildly dilated. There is moderate left ventricular hypertrophy. Left ventricular diastolic parameters are indeterminate.  3. Right ventricular systolic function is normal. The right ventricular size is normal.  4. The mitral valve is normal in structure. Trivial mitral valve regurgitation. No evidence of mitral stenosis. Severe mitral annular calcification.  5. The aortic valve is tricuspid. Aortic valve regurgitation is not visualized. Aortic valve sclerosis/calcification is present, without any evidence of aortic stenosis.  6. The inferior vena cava is dilated in size with >50% respiratory variability, suggesting right atrial pressure of 8 mmHg. Comparison(s): No prior Echocardiogram.  FINDINGS  Left Ventricle: Left ventricular ejection fraction, by estimation, is 40 to 45%. The left ventricle has mildly decreased function. The left ventricle demonstrates regional wall motion abnormalities. The left ventricular internal cavity size was mildly dilated. There is moderate left ventricular hypertrophy. Left ventricular diastolic function could not be evaluated due to mitral annular calcification (moderate or greater). Left ventricular diastolic parameters are indeterminate. Right Ventricle: The right ventricular size is normal. Right ventricular systolic function is normal. Left Atrium: Left atrial size was normal in size. Right Atrium: Right atrial size was normal in size. Pericardium: Trivial pericardial effusion is present. Mitral Valve: The mitral valve is normal in structure. Severe mitral annular calcification. Trivial mitral valve regurgitation. No evidence of mitral valve stenosis. Tricuspid Valve: The tricuspid valve is normal in structure. Tricuspid valve regurgitation is not demonstrated. No evidence of tricuspid stenosis. Aortic Valve: The aortic valve is tricuspid. Aortic valve regurgitation is not visualized. Aortic valve sclerosis/calcification is present, without any evidence of aortic stenosis. Pulmonic Valve: The pulmonic valve was not well visualized. Pulmonic valve regurgitation is not visualized. No evidence of pulmonic stenosis. Aorta: The aortic root is normal in size and structure. Venous: The inferior vena cava is dilated in size with greater than 50% respiratory variability, suggesting right atrial pressure of 8 mmHg. IAS/Shunts: The interatrial septum was not well visualized. Additional Comments: Extremely limited; there appears to be hypokinesis of the distal anteroseptal wall and apex with overall mild to moderate LV dysfunction.  LEFT VENTRICLE PLAX 2D LVIDd:         5.60 cm   Diastology LVIDs:         4.40 cm   LV e' medial:  5.87 cm/s LV PW:         1.40 cm   LV e'  lateral: 4.73 cm/s LV IVS:        1.40 cm LVOT diam:     2.20 cm LV SV:         76 LV SV Index:   30 LVOT Area:     3.80 cm  RIGHT VENTRICLE          IVC RV Basal diam:  3.70 cm  IVC diam: 2.70 cm RV Mid diam:    3.60 cm LEFT  ATRIUM           Index        RIGHT ATRIUM           Index LA diam:      4.60 cm 1.84 cm/m   RA Area:     15.20 cm LA Vol (A4C): 67.4 ml 27.01 ml/m  RA Volume:   37.10 ml  14.87 ml/m  AORTIC VALVE LVOT Vmax:   82.80 cm/s LVOT Vmean:  52.900 cm/s LVOT VTI:    0.199 m  AORTA Ao Root diam: 3.50 cm  SHUNTS Systemic VTI:  0.20 m Systemic Diam: 2.20 cm Redell Shallow MD Electronically signed by Redell Shallow MD Signature Date/Time: 01/05/2024/6:09:09 PM    Final    CARDIAC CATHETERIZATION Result Date: 01/05/2024   Ost LAD to Prox LAD lesion is 95% stenosed.   Mid Cx lesion is 50% stenosed.   Prox Cx lesion is 90% stenosed.   Prox RCA lesion is 70% stenosed.   Dist Cx lesion is 50% stenosed.   There is moderate to severe left ventricular systolic dysfunction.   LV end diastolic pressure is mildly elevated. Severe 3 vessel obstructive CAD Mod- severe LV dysfunction. EF estimated at 30-35% Mildly elevated LVEDP 19 mm Hg Plan: check Echo. CT surgery consult.   DG Chest Portable 1 View Result Date: 01/05/2024 CLINICAL DATA:  Three day history of chest pain EXAM: PORTABLE CHEST 1 VIEW COMPARISON:  Chest radiograph dated 10/21/2023 FINDINGS: Lines/tubes: Right internal jugular venous catheter tip projects over the superior cavoatrial junction. Lungs: Mildly low lung volumes. Diffusely increased interstitial opacities. No focal consolidations. Pleura: No pneumothorax or pleural effusion. Heart/mediastinum: Increased enlargement of the cardiomediastinal silhouette. Bones: No acute osseous abnormality. IMPRESSION: Increased cardiomegaly and diffusely increased interstitial opacities, which may represent pulmonary edema. Electronically Signed   By: Limin  Xu M.D.   On: 01/05/2024 11:00     Medications:  sodium chloride      heparin  1,500 Units/hr (01/06/24 0800)    aspirin  EC  81 mg Oral Daily   atorvastatin   80 mg Oral Daily   carvedilol   6.25 mg Oral BID WC   Chlorhexidine  Gluconate Cloth  6 each Topical Q0600   cloNIDine   0.1 mg Oral BID   fenofibrate   54 mg Oral Daily   hydrALAZINE   100 mg Oral BID   hydrochlorothiazide   25 mg Oral Daily   insulin  aspart  0-6 Units Subcutaneous TID WC   insulin  glargine  40 Units Subcutaneous QHS   isosorbide  mononitrate  30 mg Oral QHS   losartan   100 mg Oral QHS   sodium chloride  flush  3 mL Intravenous Q12H   sucroferric oxyhydroxide  500 mg Oral TID WC    Dialysis Orders: [facility] MWF 4:00 350/500 EDW 133.5 kg 2K/2.5 Ca TDC Heparin  2000 + 1400/hr  Calcitriol 1.25 three times per week Mircera 90 mcg q 2 wks (last 12/1)    Assessment/Plan: NSTEMI - LHC 12/5 with severe 3V CAD. EF 30-35%. Seen by CTS, will likely need CABG, waiting for attending input. ESRD - HD MWF. HD overnight with net UF 2.2L.  Next HD on 01/08/24.  Access- Using [facility]. H/o failed AVF x 2  Hypertension - BP acceptable. Continue home meds - carvedilol  6.25mg  BID, clonidine  0.1mg  BID, hydralazine  100mg  BID, hydrochlorothiazide  25mg  every day and losartan  100mg  every day.  Volume - CXR pre HD with possible pulmonary edema.  Does not appear grossly overloaded. On RA.  Below EDW by weights here.  Continue UF as tolerated.  Anemia - Hgb 9.9. ESA recently dosed as outpatient. No iron with high ferritin. Metabolic bone disease - Ca and phos in goal. Continue home binder and VDRA. Nutrition - Renal diet with fluid restriction  DMT2 - Insulin  per primary   Manuelita Labella, PA-C [facility] 01/06/2024,9:55 AM  LOS: 1 day

## 2024-01-06 NOTE — Progress Notes (Signed)
 PHARMACY - ANTICOAGULATION CONSULT NOTE  Pharmacy Consult for Heparin  Indication: chest pain/ACS, 3V CAD, awaiting CABG  Allergies  Allergen Reactions   Latex Hives   Lodine [Etodolac] Nausea And Vomiting    Patient Measurements: Height: 6' (182.9 cm) Weight: 131.5 kg (290 lb) IBW/kg (Calculated) : 77.6 HEPARIN  DW (KG): 107.4  Vital Signs: Temp: 97.5 F (36.4 C) (12/06 1615) Temp Source: Oral (12/06 1615) BP: 133/56 (12/06 1515) Pulse Rate: 62 (12/06 1515)  Labs: Recent Labs    01/05/24 1018 01/06/24 0417 01/06/24 1053 01/06/24 2147  HGB 9.6* 9.9*  --   --   HCT 30.0* 30.2*  --   --   PLT 253 280  --   --   HEPARINUNFRC  --   --  <0.10* 0.17*  CREATININE 5.29* 4.02*  --   --     Estimated Creatinine Clearance: 24 mL/min (A) (by C-G formula based on SCr of 4.02 mg/dL (H)).  Assessment: 68 year old male presents from dialysis with chest pain ongoing for the past three days. Cardiac enzymes are elevated now s/p heart cath 12/5 and found to have severe 3 vessel disease. Planning CABG on 01/08/24.  No anticoagulation prior to admission. Pharmacy consulted for heparin  dosing.  PM: heparin  level is subtherapeutic on 1800 units/hr. No known infusion issues or bleeding per RN.   Goal of Therapy:  Heparin  level 0.3-0.7 units/ml Monitor platelets by anticoagulation protocol: Yes   Plan:  Heparin  bolus 2000 units IV x1 Increase heparin  drip to 2100 units/hr. Heparin  level ~8 hrs after rate change. Daily heparin  level and CBC while on heparin .  Lynwood Poplar, PharmD, BCPS Clinical Pharmacist 01/06/2024 10:38 PM

## 2024-01-06 NOTE — Plan of Care (Signed)

## 2024-01-06 NOTE — Progress Notes (Signed)
 PHARMACY - ANTICOAGULATION CONSULT NOTE  Pharmacy Consult for Heparin  Indication: chest pain/ACS, 3V CAD, awaiting CABG  Allergies  Allergen Reactions   Latex Hives   Lodine [Etodolac] Nausea And Vomiting    Patient Measurements: Height: 6' (182.9 cm) Weight: 131.5 kg (290 lb) IBW/kg (Calculated) : 77.6 HEPARIN  DW (KG): 107.4  Vital Signs: Temp: 97.4 F (36.3 C) (12/06 1148) Temp Source: Oral (12/06 1148) BP: 118/49 (12/06 0847) Pulse Rate: 73 (12/06 0847)  Labs: Recent Labs    01/05/24 1018 01/06/24 0417 01/06/24 1053  HGB 9.6* 9.9*  --   HCT 30.0* 30.2*  --   PLT 253 280  --   HEPARINUNFRC  --   --  <0.10*  CREATININE 5.29* 4.02*  --     Estimated Creatinine Clearance: 24 mL/min (A) (by C-G formula based on SCr of 4.02 mg/dL (H)).  Assessment: 68 year old male presents from dialysis with chest pain ongoing for the past three days. Cardiac enzymes are elevated now s/p heart cath 12/5 and found to have severe 3 vessel disease. Planning CABG on 01/08/24.  No anticoagulation prior to admission. Pharmacy consulted for heparin  dosing.  Heparin  drip only ran for a few hours pre-cath, then resumed ~8 hrs after sheath out. Initial heparin  level is undetectable (< 0.10) on 1500 units/hr. No known infusion issues or bleeding per RN.   Goal of Therapy:  Heparin  level 0.3-0.7 units/ml Monitor platelets by anticoagulation protocol: Yes   Plan:  Increase heparin  drip to 1800 units/hr. Heparin  level ~8 hrs after rate change. Daily heparin  level and CBC while on heparin .  Genaro Zebedee Calin, RPh 01/06/2024,1:38 PM

## 2024-01-07 ENCOUNTER — Encounter (HOSPITAL_COMMUNITY): Payer: Self-pay | Admitting: Cardiology

## 2024-01-07 ENCOUNTER — Inpatient Hospital Stay (HOSPITAL_COMMUNITY)

## 2024-01-07 LAB — RENAL FUNCTION PANEL
Albumin: 2.6 g/dL — ABNORMAL LOW (ref 3.5–5.0)
Anion gap: 11 (ref 5–15)
BUN: 52 mg/dL — ABNORMAL HIGH (ref 8–23)
CO2: 25 mmol/L (ref 22–32)
Calcium: 7.9 mg/dL — ABNORMAL LOW (ref 8.9–10.3)
Chloride: 97 mmol/L — ABNORMAL LOW (ref 98–111)
Creatinine, Ser: 5.89 mg/dL — ABNORMAL HIGH (ref 0.61–1.24)
GFR, Estimated: 10 mL/min — ABNORMAL LOW (ref >60)
Glucose, Bld: 210 mg/dL — ABNORMAL HIGH (ref 70–99)
Phosphorus: 5 mg/dL — ABNORMAL HIGH (ref 2.5–4.6)
Potassium: 3.8 mmol/L (ref 3.5–5.1)
Sodium: 133 mmol/L — ABNORMAL LOW (ref 135–145)

## 2024-01-07 LAB — CBC
HCT: 26.2 % — ABNORMAL LOW (ref 39.0–52.0)
Hemoglobin: 8.7 g/dL — ABNORMAL LOW (ref 13.0–17.0)
MCH: 30.6 pg (ref 26.0–34.0)
MCHC: 33.2 g/dL (ref 30.0–36.0)
MCV: 92.3 fL (ref 80.0–100.0)
Platelets: 247 K/uL (ref 150–400)
RBC: 2.84 MIL/uL — ABNORMAL LOW (ref 4.22–5.81)
RDW: 14.9 % (ref 11.5–15.5)
WBC: 11.1 K/uL — ABNORMAL HIGH (ref 4.0–10.5)
nRBC: 0 % (ref 0.0–0.2)

## 2024-01-07 LAB — LIPOPROTEIN A (LPA): Lipoprotein (a): 148.3 nmol/L — ABNORMAL HIGH (ref <75.0)

## 2024-01-07 LAB — PROTIME-INR
INR: 1.1 (ref 0.8–1.2)
Prothrombin Time: 14.6 s (ref 11.4–15.2)

## 2024-01-07 LAB — APTT: aPTT: 56 s — ABNORMAL HIGH (ref 24–36)

## 2024-01-07 LAB — VAS US DOPPLER PRE CABG

## 2024-01-07 LAB — BLOOD GAS, ARTERIAL
Acid-Base Excess: 1 mmol/L (ref 0.0–2.0)
Bicarbonate: 25.2 mmol/L (ref 20.0–28.0)
O2 Saturation: 96.8 %
Patient temperature: 37
pCO2 arterial: 38 mmHg (ref 32–48)
pH, Arterial: 7.43 (ref 7.35–7.45)
pO2, Arterial: 102 mmHg (ref 83–108)

## 2024-01-07 LAB — HEPARIN LEVEL (UNFRACTIONATED)
Heparin Unfractionated: 0.21 [IU]/mL — ABNORMAL LOW (ref 0.30–0.70)
Heparin Unfractionated: 0.45 [IU]/mL (ref 0.30–0.70)

## 2024-01-07 LAB — GLUCOSE, CAPILLARY
Glucose-Capillary: 180 mg/dL — ABNORMAL HIGH (ref 70–99)
Glucose-Capillary: 303 mg/dL — ABNORMAL HIGH (ref 70–99)
Glucose-Capillary: 354 mg/dL — ABNORMAL HIGH (ref 70–99)
Glucose-Capillary: 341 mg/dL — ABNORMAL HIGH (ref 70–99)

## 2024-01-07 MED ORDER — NOREPINEPHRINE 4 MG/250ML-% IV SOLN
0.0000 ug/min | INTRAVENOUS | Status: DC
  Filled 2024-01-07: qty 250

## 2024-01-07 MED ORDER — MANNITOL 20 % IV SOLN
Freq: Once | INTRAVENOUS | Status: DC
  Filled 2024-01-07 (×2): qty 13

## 2024-01-07 MED ORDER — HEPARIN BOLUS VIA INFUSION
4000.0000 [IU] | Freq: Once | INTRAVENOUS | Status: AC
  Administered 2024-01-07: 4000 [IU] via INTRAVENOUS
  Filled 2024-01-07: qty 4000

## 2024-01-07 MED ORDER — CEFAZOLIN SODIUM-DEXTROSE 2-4 GM/100ML-% IV SOLN
2.0000 g | INTRAVENOUS | Status: DC
  Filled 2024-01-07: qty 100

## 2024-01-07 MED ORDER — VANCOMYCIN HCL 1.5 G IV SOLR
1500.0000 mg | INTRAVENOUS | Status: DC
  Filled 2024-01-07: qty 30

## 2024-01-07 MED ORDER — METOPROLOL TARTRATE 12.5 MG HALF TABLET: 12.5000 mg | ORAL_TABLET | Freq: Once | ORAL | Status: DC

## 2024-01-07 MED ORDER — TEMAZEPAM 15 MG PO CAPS: 15.0000 mg | ORAL_CAPSULE | Freq: Once | ORAL | Status: DC | PRN

## 2024-01-07 MED ORDER — CHLORHEXIDINE GLUCONATE CLOTH 2 % EX PADS
6.0000 | MEDICATED_PAD | Freq: Once | CUTANEOUS | Status: AC
  Administered 2024-01-07: 6 via TOPICAL

## 2024-01-07 MED ORDER — HEPARIN 30,000 UNITS/1000 ML (OHS) CELLSAVER SOLUTION
Status: DC
  Filled 2024-01-07: qty 1000

## 2024-01-07 MED ORDER — NITROGLYCERIN IN D5W 200-5 MCG/ML-% IV SOLN
2.0000 ug/min | INTRAVENOUS | Status: DC
  Filled 2024-01-07: qty 250

## 2024-01-07 MED ORDER — BISACODYL 5 MG PO TBEC
5.0000 mg | DELAYED_RELEASE_TABLET | Freq: Once | ORAL | Status: AC
  Administered 2024-01-07: 5 mg via ORAL
  Filled 2024-01-07: qty 1

## 2024-01-07 MED ORDER — TRANEXAMIC ACID (OHS) PUMP PRIME SOLUTION
2.0000 mg/kg | INTRAVENOUS | Status: DC
  Filled 2024-01-07 (×2): qty 2.63

## 2024-01-07 MED ORDER — TRANEXAMIC ACID (OHS) BOLUS VIA INFUSION
15.0000 mg/kg | INTRAVENOUS | Status: AC
  Administered 2024-01-08: 1972.5 mg via INTRAVENOUS
  Filled 2024-01-07: qty 1973

## 2024-01-07 MED ORDER — POTASSIUM CHLORIDE 2 MEQ/ML IV SOLN
80.0000 meq | INTRAVENOUS | Status: DC
  Filled 2024-01-07: qty 40

## 2024-01-07 MED ORDER — CHLORHEXIDINE GLUCONATE 0.12 % MT SOLN
15.0000 mL | Freq: Once | OROMUCOSAL | Status: AC
  Administered 2024-01-08: 15 mL via OROMUCOSAL

## 2024-01-07 MED ORDER — PLASMA-LYTE A IV SOLN
INTRAVENOUS | Status: AC
  Administered 2024-01-08: 500 mL
  Filled 2024-01-07: qty 2.5

## 2024-01-07 MED ORDER — TRANEXAMIC ACID 1000 MG/10ML IV SOLN
1.5000 mg/kg/h | INTRAVENOUS | Status: AC
  Administered 2024-01-08: 1.5 mg/kg/h via INTRAVENOUS
  Filled 2024-01-07 (×3): qty 25

## 2024-01-07 MED ORDER — CEFAZOLIN SODIUM-DEXTROSE 3-4 GM/150ML-% IV SOLN
3.0000 g | INTRAVENOUS | Status: AC
  Administered 2024-01-08: 2 g via INTRAVENOUS
  Administered 2024-01-08: 3 g via INTRAVENOUS
  Filled 2024-01-07 (×2): qty 150

## 2024-01-07 MED ORDER — MAGNESIUM SULFATE 50 % IJ SOLN: 40.0000 meq | INTRAMUSCULAR | Status: DC

## 2024-01-07 MED ORDER — INSULIN REGULAR(HUMAN) IN NACL 100-0.9 UT/100ML-% IV SOLN
INTRAVENOUS | Status: AC
  Administered 2024-01-08: 3.4 [IU]/h via INTRAVENOUS
  Filled 2024-01-07 (×2): qty 100

## 2024-01-07 MED ORDER — DEXMEDETOMIDINE HCL IN NACL 400 MCG/100ML IV SOLN
0.1000 ug/kg/h | INTRAVENOUS | Status: AC
  Administered 2024-01-08: .7 ug/kg/h via INTRAVENOUS
  Filled 2024-01-07: qty 100

## 2024-01-07 MED ORDER — CEFAZOLIN SODIUM-DEXTROSE 2-4 GM/100ML-% IV SOLN: 2.0000 g | INTRAVENOUS | Status: DC

## 2024-01-07 MED ORDER — VANCOMYCIN HCL 1500 MG/300ML IV SOLN
1500.0000 mg | Freq: Once | INTRAVENOUS | Status: AC
  Administered 2024-01-08: 1500 mg via INTRAVENOUS
  Filled 2024-01-07 (×2): qty 300

## 2024-01-07 MED ORDER — EPINEPHRINE HCL 5 MG/250ML IV SOLN IN NS
0.0000 ug/min | INTRAVENOUS | Status: DC
  Filled 2024-01-07: qty 250

## 2024-01-07 MED ORDER — MILRINONE LACTATE IN DEXTROSE 20-5 MG/100ML-% IV SOLN
0.3000 ug/kg/min | INTRAVENOUS | Status: DC
  Filled 2024-01-07: qty 100

## 2024-01-07 MED ORDER — CHLORHEXIDINE GLUCONATE CLOTH 2 % EX PADS: 6.0000 | MEDICATED_PAD | Freq: Every day | CUTANEOUS | Status: DC

## 2024-01-07 MED ORDER — PHENYLEPHRINE HCL-NACL 20-0.9 MG/250ML-% IV SOLN
30.0000 ug/min | INTRAVENOUS | Status: AC
  Administered 2024-01-08: 25 ug/min via INTRAVENOUS
  Filled 2024-01-07: qty 250

## 2024-01-07 MED ORDER — CHLORHEXIDINE GLUCONATE CLOTH 2 % EX PADS
6.0000 | MEDICATED_PAD | Freq: Once | CUTANEOUS | Status: AC
  Administered 2024-01-08: 6 via TOPICAL

## 2024-01-07 NOTE — Plan of Care (Signed)

## 2024-01-07 NOTE — Progress Notes (Signed)
 Patient's blood sugar has been elevated today. Notified MD.

## 2024-01-07 NOTE — Progress Notes (Signed)
 PHARMACY - ANTICOAGULATION CONSULT NOTE  Pharmacy Consult for Heparin  Indication: chest pain/ACS, 3V CAD, awaiting CABG  Allergies  Allergen Reactions   Latex Hives   Lodine [Etodolac] Nausea And Vomiting    Patient Measurements: Height: 6' (182.9 cm) Weight: 131.5 kg (290 lb) IBW/kg (Calculated) : 77.6 HEPARIN  DW (KG): 107.4  Vital Signs: Temp: 97.6 F (36.4 C) (12/07 2000) Temp Source: Oral (12/07 2000) BP: 135/60 (12/07 2000) Pulse Rate: 54 (12/07 1628)  Labs: Recent Labs    01/05/24 1018 01/06/24 0417 01/06/24 1053 01/06/24 2147 01/07/24 0454 01/07/24 0918 01/07/24 1919  HGB 9.6* 9.9*  --   --  8.7*  --   --   HCT 30.0* 30.2*  --   --  26.2*  --   --   PLT 253 280  --   --  247  --   --   APTT  --   --   --   --   --  56*  --   LABPROT  --   --   --   --   --  14.6  --   INR  --   --   --   --   --  1.1  --   HEPARINUNFRC  --   --    < > 0.17*  --  0.21* 0.45  CREATININE 5.29* 4.02*  --   --  5.89*  --   --    < > = values in this interval not displayed.    Estimated Creatinine Clearance: 16.4 mL/min (A) (by C-G formula based on SCr of 5.89 mg/dL (H)).  Assessment: 68 year old male presents from dialysis with chest pain ongoing for the past three days. Cardiac enzymes are elevated now s/p heart cath 12/5 and found to have severe 3 vessel disease. Planning CABG on 01/08/24.  No anticoagulation prior to admission. Pharmacy consulted for heparin  dosing.  For dialysis in am then CABG in the afternoon.  Heparin  level came back therapeutic tonight. Cont current rate.   Goal of Therapy:  Heparin  level 0.3-0.7 units/ml Monitor platelets by anticoagulation protocol: Yes   Plan:  Cont heparin  drip 2400 units/hr. Daily heparin  level and CBC while on heparin .   Sergio Batch, PharmD, BCIDP, AAHIVP, CPP Infectious Disease Pharmacist 01/07/2024 8:12 PM

## 2024-01-07 NOTE — Progress Notes (Signed)
 Precabg duplex  has been completed. Refer to [facility] under chart review to view preliminary results.   01/07/2024  3:14 PM Harland Aguiniga, Ricka BIRCH

## 2024-01-07 NOTE — Progress Notes (Signed)
 Patient had a 6 beat run of brady down to 28. He is asymptomatic. Notified MD.

## 2024-01-07 NOTE — Progress Notes (Signed)
 PROGRESS NOTE    [name]  FMW:[id number] DOB: 1955/11/18 DOA: 01/05/2024 PCP: Baird Comer GAILS, NP    Brief Narrative:  68 year old ESRD on hemodialysis, type 2 diabetes, hypertension, obesity, chronic anemia developed precordial chest pain and pressure sensation while receiving dialysis.  Brought to the emergency room.  Troponins were Elevated.  Started on heparin  infusion and transferred to [facility] for cardiac cath. Underwent cardiac cath, found to have triple-vessel disease and congestive heart failure.  Subjective: Patient seen and examined.  Denies any chest pain or shortness of breath.  Remains on heparin  drip.  Possible CABG tomorrow.   Assessment & Plan:   Coronary artery disease, non-STEMI:  Cardiac cath 12/5, triple-vessel disease Ejection fraction 40 to 45% Currently no active chest pain.  Remains on heparin  infusion. Aspirin , Lipitor, carvedilol , fenofibrate , Imdur .  Cardiology following.  CT surgery following, anticipating CABG tomorrow.    ESRD on hemodialysis, received dialysis.  Next dialysis tomorrow morning.  Nephrology following.   Type 2 diabetes on insulin : Well-controlled.  Resumed home dose of insulin .  Hypertension: Blood pressure stable on current regimen carvedilol  and Imdur .    DVT prophylaxis: SCDs   Code Status: Full code Family Communication: None at bedside Disposition Plan: Status is: Inpatient Remains inpatient appropriate because: Active treatment, anticipate surgery     Consultants:  Cardiology Nephrology Cardiothoracic surgery  Procedures:  Cardiac cath 12/5  Antimicrobials:  None     Objective: Vitals:   01/06/24 2358 01/07/24 0351 01/07/24 0743 01/07/24 0908  BP: (!) 108/49  (!) 118/53 (!) 118/53  Pulse: (!) 53  (!) 53   Resp: 17 16 16    Temp: 98 F (36.7 C) (!) 97.5 F (36.4 C) (!) 97.5 F (36.4 C)   TempSrc: Oral Oral Oral   SpO2: 96%  96%   Weight:      Height:         Intake/Output Summary (Last 24 hours) at 01/07/2024 1006 Last data filed at 01/07/2024 0745 Gross per 24 hour  Intake 652.8 ml  Output --  Net 652.8 ml   Filed Weights   01/05/24 1011  Weight: 131.5 kg    Examination:  General exam: Comfortable.  On room air.  Pleasant and interactive. Respiratory system: Clear to auscultation. Respiratory effort normal. Right IJ permacath present. Cardiovascular system: S1 & S2 heard, RRR.  Gastrointestinal system: Abdomen is nondistended, soft and nontender. No organomegaly or masses felt. Normal bowel sounds heard. Central nervous system: Alert and oriented. No focal neurological deficits.    Data Reviewed: I have personally reviewed following labs and imaging studies  CBC: Recent Labs  Lab 01/05/24 1018 01/06/24 0417 01/07/24 0454  WBC 12.2* 12.6* 11.1*  HGB 9.6* 9.9* 8.7*  HCT 30.0* 30.2* 26.2*  MCV 96.5 93.5 92.3  PLT 253 280 247   Basic Metabolic Panel: Recent Labs  Lab 01/05/24 1018 01/06/24 0417 01/07/24 0454  NA 136 134* 133*  K 4.2 3.9 3.8  CL 100 97* 97*  CO2 26 26 25   GLUCOSE 306* 194* 210*  BUN 48* 29* 52*  CREATININE 5.29* 4.02* 5.89*  CALCIUM  8.9 8.2* 7.9*  MG  --  1.7  --   PHOS  --  3.1 5.0*   GFR: Estimated Creatinine Clearance: 16.4 mL/min (A) (by C-G formula based on SCr of 5.89 mg/dL (H)). Liver Function Tests: Recent Labs  Lab 01/06/24 0417 01/07/24 0454  ALBUMIN  2.9* 2.6*   No results for input(s): LIPASE, AMYLASE in the last 168  hours. No results for input(s): AMMONIA in the last 168 hours. Coagulation Profile: No results for input(s): INR, PROTIME in the last 168 hours. Cardiac Enzymes: No results for input(s): CKTOTAL, CKMB, CKMBINDEX, TROPONINI in the last 168 hours. BNP (last 3 results) No results for input(s): PROBNP in the last 8760 hours. HbA1C: Recent Labs    01/05/24 1210  HGBA1C 9.9*   CBG: Recent Labs  Lab 01/06/24 0846 01/06/24 1145  01/06/24 1640 01/06/24 2058 01/07/24 0741  GLUCAP 282* 320* 363* 411* 180*   Lipid Profile: Recent Labs    01/06/24 0417  CHOL 88  HDL 34*  LDLCALC 33  TRIG 892  CHOLHDL 2.6   Thyroid Function Tests: Recent Labs    01/05/24 1210  TSH 2.190   Anemia Panel: Recent Labs    01/05/24 1210  VITAMINB12 819  FERRITIN 1,105*  TIBC 298  IRON 36*  RETICCTPCT 1.7   Sepsis Labs: No results for input(s): PROCALCITON, LATICACIDVEN in the last 168 hours.  Recent Results (from the past 240 hours)  Surgical pcr screen     Status: None   Collection Time: 01/06/24  5:49 PM   Specimen: Nasal Mucosa; Nasal Swab  Result Value Ref Range Status   MRSA, PCR NEGATIVE NEGATIVE Final   Staphylococcus aureus NEGATIVE NEGATIVE Final    Comment: (NOTE) The Xpert SA Assay (FDA approved for NASAL specimens in patients 49 years of age and older), is one component of a comprehensive surveillance program. It is not intended to diagnose infection nor to guide or monitor treatment. Performed at [facility], [address], [place], [place] [address]          Radiology Studies: ECHOCARDIOGRAM COMPLETE Result Date: 01/05/2024    ECHOCARDIOGRAM REPORT   Patient Name:   Timothy Holloway Date of Exam: 01/05/2024 Medical Rec #:  [id number]           Height:       72.0 in Accession #:    [id number]          Weight:       290.0 lb Date of Birth:  [birth year]            BSA:          2.495 m Patient Age:    68 years            BP:           131/60 mmHg Patient Gender: M                   HR:           55 bpm. Exam Location:  Inpatient Procedure: 2D Echo, Cardiac Doppler and Color Doppler (Both Spectral and Color            Flow Doppler were utilized during procedure). Indications:    CAD  History:        Patient has no prior history of Echocardiogram examinations.                 Previous Myocardial Infarction, Signs/Symptoms:Chest Pain and                 Edema; Risk Factors:Hypertension,  Dyslipidemia and Diabetes.  Sonographer:    Juliene Rucks Referring Phys: [id number] HARRELL O LIGHTFOOT  Sonographer Comments: Patient is obese. IMPRESSIONS  1. Extremely limited; there appears to be hypokinesis of the distal anteroseptal wall and apex with overall mild to moderate LV dysfunction.  2.  Left ventricular ejection fraction, by estimation, is 40 to 45%. The left ventricle has mildly decreased function. The left ventricle demonstrates regional wall motion abnormalities (see scoring diagram/findings for description). The left ventricular  internal cavity size was mildly dilated. There is moderate left ventricular hypertrophy. Left ventricular diastolic parameters are indeterminate.  3. Right ventricular systolic function is normal. The right ventricular size is normal.  4. The mitral valve is normal in structure. Trivial mitral valve regurgitation. No evidence of mitral stenosis. Severe mitral annular calcification.  5. The aortic valve is tricuspid. Aortic valve regurgitation is not visualized. Aortic valve sclerosis/calcification is present, without any evidence of aortic stenosis.  6. The inferior vena cava is dilated in size with >50% respiratory variability, suggesting right atrial pressure of 8 mmHg. Comparison(s): No prior Echocardiogram. FINDINGS  Left Ventricle: Left ventricular ejection fraction, by estimation, is 40 to 45%. The left ventricle has mildly decreased function. The left ventricle demonstrates regional wall motion abnormalities. The left ventricular internal cavity size was mildly dilated. There is moderate left ventricular hypertrophy. Left ventricular diastolic function could not be evaluated due to mitral annular calcification (moderate or greater). Left ventricular diastolic parameters are indeterminate. Right Ventricle: The right ventricular size is normal. Right ventricular systolic function is normal. Left Atrium: Left atrial size was normal in size. Right Atrium: Right atrial  size was normal in size. Pericardium: Trivial pericardial effusion is present. Mitral Valve: The mitral valve is normal in structure. Severe mitral annular calcification. Trivial mitral valve regurgitation. No evidence of mitral valve stenosis. Tricuspid Valve: The tricuspid valve is normal in structure. Tricuspid valve regurgitation is not demonstrated. No evidence of tricuspid stenosis. Aortic Valve: The aortic valve is tricuspid. Aortic valve regurgitation is not visualized. Aortic valve sclerosis/calcification is present, without any evidence of aortic stenosis. Pulmonic Valve: The pulmonic valve was not well visualized. Pulmonic valve regurgitation is not visualized. No evidence of pulmonic stenosis. Aorta: The aortic root is normal in size and structure. Venous: The inferior vena cava is dilated in size with greater than 50% respiratory variability, suggesting right atrial pressure of 8 mmHg. IAS/Shunts: The interatrial septum was not well visualized. Additional Comments: Extremely limited; there appears to be hypokinesis of the distal anteroseptal wall and apex with overall mild to moderate LV dysfunction.  LEFT VENTRICLE PLAX 2D LVIDd:         5.60 cm   Diastology LVIDs:         4.40 cm   LV e' medial:  5.87 cm/s LV PW:         1.40 cm   LV e' lateral: 4.73 cm/s LV IVS:        1.40 cm LVOT diam:     2.20 cm LV SV:         76 LV SV Index:   30 LVOT Area:     3.80 cm  RIGHT VENTRICLE          IVC RV Basal diam:  3.70 cm  IVC diam: 2.70 cm RV Mid diam:    3.60 cm LEFT ATRIUM           Index        RIGHT ATRIUM           Index LA diam:      4.60 cm 1.84 cm/m   RA Area:     15.20 cm LA Vol (A4C): 67.4 ml 27.01 ml/m  RA Volume:   37.10 ml  14.87 ml/m  AORTIC VALVE LVOT  Vmax:   82.80 cm/s LVOT Vmean:  52.900 cm/s LVOT VTI:    0.199 m  AORTA Ao Root diam: 3.50 cm  SHUNTS Systemic VTI:  0.20 m Systemic Diam: 2.20 cm Redell Shallow MD Electronically signed by Redell Shallow MD Signature Date/Time:  01/05/2024/6:09:09 PM    Final    CARDIAC CATHETERIZATION Result Date: 01/05/2024   Ost LAD to Prox LAD lesion is 95% stenosed.   Mid Cx lesion is 50% stenosed.   Prox Cx lesion is 90% stenosed.   Prox RCA lesion is 70% stenosed.   Dist Cx lesion is 50% stenosed.   There is moderate to severe left ventricular systolic dysfunction.   LV end diastolic pressure is mildly elevated. Severe 3 vessel obstructive CAD Mod- severe LV dysfunction. EF estimated at 30-35% Mildly elevated LVEDP 19 mm Hg Plan: check Echo. CT surgery consult.   DG Chest Portable 1 View Result Date: 01/05/2024 CLINICAL DATA:  Three day history of chest pain EXAM: PORTABLE CHEST 1 VIEW COMPARISON:  Chest radiograph dated 10/21/2023 FINDINGS: Lines/tubes: Right internal jugular venous catheter tip projects over the superior cavoatrial junction. Lungs: Mildly low lung volumes. Diffusely increased interstitial opacities. No focal consolidations. Pleura: No pneumothorax or pleural effusion. Heart/mediastinum: Increased enlargement of the cardiomediastinal silhouette. Bones: No acute osseous abnormality. IMPRESSION: Increased cardiomegaly and diffusely increased interstitial opacities, which may represent pulmonary edema. Electronically Signed   By: Limin  Xu M.D.   On: 01/05/2024 11:00        Scheduled Meds:  aspirin  EC  81 mg Oral Daily   atorvastatin   80 mg Oral Daily   carvedilol   6.25 mg Oral BID WC   [START ON 01/08/2024] chlorhexidine   15 mL Mouth/Throat Once   Chlorhexidine  Gluconate Cloth  6 each Topical Q0600   Chlorhexidine  Gluconate Cloth  6 each Topical Once   And   [START ON 01/08/2024] Chlorhexidine  Gluconate Cloth  6 each Topical Once   cloNIDine   0.1 mg Oral BID   fenofibrate   54 mg Oral Daily   hydrALAZINE   100 mg Oral BID   insulin  aspart  0-6 Units Subcutaneous TID WC   insulin  glargine  40 Units Subcutaneous QHS   isosorbide  mononitrate  30 mg Oral QHS   [START ON 01/08/2024] metoprolol  tartrate  12.5 mg Oral  Once   sodium chloride  flush  3 mL Intravenous Q12H   sucroferric oxyhydroxide  500 mg Oral TID WC   Continuous Infusions:  heparin  2,100 Units/hr (01/07/24 0630)     LOS: 2 days       Renato Applebaum, MD [facility]

## 2024-01-07 NOTE — Progress Notes (Signed)
 PHARMACY - ANTICOAGULATION CONSULT NOTE  Pharmacy Consult for Heparin  Indication: chest pain/ACS, 3V CAD, awaiting CABG  Allergies  Allergen Reactions   Latex Hives   Lodine [Etodolac] Nausea And Vomiting    Patient Measurements: Height: 6' (182.9 cm) Weight: 131.5 kg (290 lb) IBW/kg (Calculated) : 77.6 HEPARIN  DW (KG): 107.4  Vital Signs: Temp: 97.5 F (36.4 C) (12/07 0743) Temp Source: Oral (12/07 0743) BP: 118/53 (12/07 0908) Pulse Rate: 53 (12/07 0743)  Labs: Recent Labs    01/05/24 1018 01/06/24 0417 01/06/24 1053 01/06/24 2147 01/07/24 0454 01/07/24 0918  HGB 9.6* 9.9*  --   --  8.7*  --   HCT 30.0* 30.2*  --   --  26.2*  --   PLT 253 280  --   --  247  --   APTT  --   --   --   --   --  56*  LABPROT  --   --   --   --   --  14.6  INR  --   --   --   --   --  1.1  HEPARINUNFRC  --   --  <0.10* 0.17*  --  0.21*  CREATININE 5.29* 4.02*  --   --  5.89*  --     Estimated Creatinine Clearance: 16.4 mL/min (A) (by C-G formula based on SCr of 5.89 mg/dL (H)).  Assessment: 68 year old male presents from dialysis with chest pain ongoing for the past three days. Cardiac enzymes are elevated now s/p heart cath 12/5 and found to have severe 3 vessel disease. Planning CABG on 01/08/24.  No anticoagulation prior to admission. Pharmacy consulted for heparin  dosing.  Heparin  level remains subtherapeutic (0.21) on 2100 units/hr after bolus and rate increase.  Hemoglobin trended down some, no bleeding reported. For dialysis in am then CABG in the afternoon.  Goal of Therapy:  Heparin  level 0.3-0.7 units/ml Monitor platelets by anticoagulation protocol: Yes   Plan:  Heparin  4000 units IV bolus Increase heparin  drip to 2400 units/hr. Heparin  level ~8 hrs after rate change. Daily heparin  level and CBC while on heparin .  Genaro Zebedee Calin, RPh 01/07/2024,11:14 AM

## 2024-01-07 NOTE — Progress Notes (Addendum)
 [facility] Progress Note   Subjective:   Patient seen and examined at bedside.  Sitting on the side of the bed.  Just finished breakfast.  Denies chest pain, shortness of breath, abdominal pain, nausea, vomiting and edema.    Objective Vitals:   01/06/24 1615 01/06/24 2358 01/07/24 0351 01/07/24 0743  BP:  (!) 108/49  (!) 118/53  Pulse:  (!) 53  (!) 53  Resp: 17 17 16 16   Temp: (!) 97.5 F (36.4 C) 98 F (36.7 C) (!) 97.5 F (36.4 C) (!) 97.5 F (36.4 C)  TempSrc: Oral Oral Oral Oral  SpO2: 92% 96%  96%  Weight:      Height:       Physical Exam General:alert male, sitting on side of bed in NAD Heart:RRR Lungs:CTAB, nml WOB on RA Abdomen:soft, NTND Extremities:trace LE edema Dialysis Access: [facility] [place]   Filed Weights   01/05/24 1011  Weight: 131.5 kg    Intake/Output Summary (Last 24 hours) at 01/07/2024 0835 Last data filed at 01/07/2024 0745 Gross per 24 hour  Intake 892.8 ml  Output --  Net 892.8 ml    Additional Objective Labs: Basic Metabolic Panel: Recent Labs  Lab 01/05/24 1018 01/06/24 0417 01/07/24 0454  NA 136 134* 133*  K 4.2 3.9 3.8  CL 100 97* 97*  CO2 26 26 25   GLUCOSE 306* 194* 210*  BUN 48* 29* 52*  CREATININE 5.29* 4.02* 5.89*  CALCIUM  8.9 8.2* 7.9*  PHOS  --  3.1 5.0*   Liver Function Tests: Recent Labs  Lab 01/06/24 0417 01/07/24 0454  ALBUMIN  2.9* 2.6*   CBC: Recent Labs  Lab 01/05/24 1018 01/06/24 0417 01/07/24 0454  WBC 12.2* 12.6* 11.1*  HGB 9.6* 9.9* 8.7*  HCT 30.0* 30.2* 26.2*  MCV 96.5 93.5 92.3  PLT 253 280 247   CBG: Recent Labs  Lab 01/06/24 0846 01/06/24 1145 01/06/24 1640 01/06/24 2058 01/07/24 0741  GLUCAP 282* 320* 363* 411* 180*   Iron Studies:  Recent Labs    01/05/24 1210  IRON 36*  TIBC 298  FERRITIN 1,105*   Lab Results  Component Value Date   INR 1.1 11/11/2022   INR 1.05 04/06/2009   Studies/Results: ECHOCARDIOGRAM COMPLETE Result Date: 01/05/2024    ECHOCARDIOGRAM  REPORT   Patient Name:   Timothy Holloway Date of Exam: 01/05/2024 Medical Rec #:  [id number]           Height:       72.0 in Accession #:    [id number]          Weight:       290.0 lb Date of Birth:  10-Jan-1956            BSA:          2.495 m Patient Age:    68 years            BP:           131/60 mmHg Patient Gender: M                   HR:           55 bpm. Exam Location:  Inpatient Procedure: 2D Echo, Cardiac Doppler and Color Doppler (Both Spectral and Color            Flow Doppler were utilized during procedure). Indications:    CAD  History:        Patient has no prior history of Echocardiogram  examinations.                 Previous Myocardial Infarction, Signs/Symptoms:Chest Pain and                 Edema; Risk Factors:Hypertension, Dyslipidemia and Diabetes.  Sonographer:    Juliene Rucks Referring Phys: [id number] HARRELL O LIGHTFOOT  Sonographer Comments: Patient is obese. IMPRESSIONS  1. Extremely limited; there appears to be hypokinesis of the distal anteroseptal wall and apex with overall mild to moderate LV dysfunction.  2. Left ventricular ejection fraction, by estimation, is 40 to 45%. The left ventricle has mildly decreased function. The left ventricle demonstrates regional wall motion abnormalities (see scoring diagram/findings for description). The left ventricular  internal cavity size was mildly dilated. There is moderate left ventricular hypertrophy. Left ventricular diastolic parameters are indeterminate.  3. Right ventricular systolic function is normal. The right ventricular size is normal.  4. The mitral valve is normal in structure. Trivial mitral valve regurgitation. No evidence of mitral stenosis. Severe mitral annular calcification.  5. The aortic valve is tricuspid. Aortic valve regurgitation is not visualized. Aortic valve sclerosis/calcification is present, without any evidence of aortic stenosis.  6. The inferior vena cava is dilated in size with >50% respiratory variability,  suggesting right atrial pressure of 8 mmHg. Comparison(s): No prior Echocardiogram. FINDINGS  Left Ventricle: Left ventricular ejection fraction, by estimation, is 40 to 45%. The left ventricle has mildly decreased function. The left ventricle demonstrates regional wall motion abnormalities. The left ventricular internal cavity size was mildly dilated. There is moderate left ventricular hypertrophy. Left ventricular diastolic function could not be evaluated due to mitral annular calcification (moderate or greater). Left ventricular diastolic parameters are indeterminate. Right Ventricle: The right ventricular size is normal. Right ventricular systolic function is normal. Left Atrium: Left atrial size was normal in size. Right Atrium: Right atrial size was normal in size. Pericardium: Trivial pericardial effusion is present. Mitral Valve: The mitral valve is normal in structure. Severe mitral annular calcification. Trivial mitral valve regurgitation. No evidence of mitral valve stenosis. Tricuspid Valve: The tricuspid valve is normal in structure. Tricuspid valve regurgitation is not demonstrated. No evidence of tricuspid stenosis. Aortic Valve: The aortic valve is tricuspid. Aortic valve regurgitation is not visualized. Aortic valve sclerosis/calcification is present, without any evidence of aortic stenosis. Pulmonic Valve: The pulmonic valve was not well visualized. Pulmonic valve regurgitation is not visualized. No evidence of pulmonic stenosis. Aorta: The aortic root is normal in size and structure. Venous: The inferior vena cava is dilated in size with greater than 50% respiratory variability, suggesting right atrial pressure of 8 mmHg. IAS/Shunts: The interatrial septum was not well visualized. Additional Comments: Extremely limited; there appears to be hypokinesis of the distal anteroseptal wall and apex with overall mild to moderate LV dysfunction.  LEFT VENTRICLE PLAX 2D LVIDd:         5.60 cm   Diastology  LVIDs:         4.40 cm   LV e' medial:  5.87 cm/s LV PW:         1.40 cm   LV e' lateral: 4.73 cm/s LV IVS:        1.40 cm LVOT diam:     2.20 cm LV SV:         76 LV SV Index:   30 LVOT Area:     3.80 cm  RIGHT VENTRICLE          IVC RV Basal diam:  3.70 cm  IVC diam: 2.70 cm RV Mid diam:    3.60 cm LEFT ATRIUM           Index        RIGHT ATRIUM           Index LA diam:      4.60 cm 1.84 cm/m   RA Area:     15.20 cm LA Vol (A4C): 67.4 ml 27.01 ml/m  RA Volume:   37.10 ml  14.87 ml/m  AORTIC VALVE LVOT Vmax:   82.80 cm/s LVOT Vmean:  52.900 cm/s LVOT VTI:    0.199 m  AORTA Ao Root diam: 3.50 cm  SHUNTS Systemic VTI:  0.20 m Systemic Diam: 2.20 cm Redell Shallow MD Electronically signed by Redell Shallow MD Signature Date/Time: 01/05/2024/6:09:09 PM    Final    CARDIAC CATHETERIZATION Result Date: 01/05/2024   Ost LAD to Prox LAD lesion is 95% stenosed.   Mid Cx lesion is 50% stenosed.   Prox Cx lesion is 90% stenosed.   Prox RCA lesion is 70% stenosed.   Dist Cx lesion is 50% stenosed.   There is moderate to severe left ventricular systolic dysfunction.   LV end diastolic pressure is mildly elevated. Severe 3 vessel obstructive CAD Mod- severe LV dysfunction. EF estimated at 30-35% Mildly elevated LVEDP 19 mm Hg Plan: check Echo. CT surgery consult.   DG Chest Portable 1 View Result Date: 01/05/2024 CLINICAL DATA:  Three day history of chest pain EXAM: PORTABLE CHEST 1 VIEW COMPARISON:  Chest radiograph dated 10/21/2023 FINDINGS: Lines/tubes: Right internal jugular venous catheter tip projects over the superior cavoatrial junction. Lungs: Mildly low lung volumes. Diffusely increased interstitial opacities. No focal consolidations. Pleura: No pneumothorax or pleural effusion. Heart/mediastinum: Increased enlargement of the cardiomediastinal silhouette. Bones: No acute osseous abnormality. IMPRESSION: Increased cardiomegaly and diffusely increased interstitial opacities, which may represent pulmonary edema.  Electronically Signed   By: Limin  Xu M.D.   On: 01/05/2024 11:00    Medications:  heparin  2,100 Units/hr (01/07/24 0630)    aspirin  EC  81 mg Oral Daily   atorvastatin   80 mg Oral Daily   bisacodyl   5 mg Oral Once   carvedilol   6.25 mg Oral BID WC   [START ON 01/08/2024] chlorhexidine   15 mL Mouth/Throat Once   Chlorhexidine  Gluconate Cloth  6 each Topical Q0600   Chlorhexidine  Gluconate Cloth  6 each Topical Once   And   [START ON 01/08/2024] Chlorhexidine  Gluconate Cloth  6 each Topical Once   cloNIDine   0.1 mg Oral BID   fenofibrate   54 mg Oral Daily   hydrALAZINE   100 mg Oral BID   hydrochlorothiazide   25 mg Oral Daily   insulin  aspart  0-6 Units Subcutaneous TID WC   insulin  glargine  40 Units Subcutaneous QHS   isosorbide  mononitrate  30 mg Oral QHS   [START ON 01/08/2024] metoprolol  tartrate  12.5 mg Oral Once   sodium chloride  flush  3 mL Intravenous Q12H   sucroferric oxyhydroxide  500 mg Oral TID WC    Dialysis Orders: [facility] MWF 4:00 350/500 EDW 133.5 kg 2K/2.5 Ca TDC Heparin  2000 + 1400/hr  Calcitriol 1.25 three times per week Mircera 90 mcg q 2 wks (last 12/1)    Assessment/Plan: NSTEMI - LHC 12/5 with severe 3V CAD. EF 30-35%. Plan for CABG tomorrow at 9:45AM with CTS.   ESRD - HD MWF. Next HD tomorrow.  Will plan for some time post surgery.  If  volume/labs stable could wait until Tuesday if needed.  Access- Using [employer] H/o failed AVF x 2  Hypertension - BP acceptable. Continue home meds - carvedilol  6.25mg  BID, clonidine  0.1mg  BID, hydralazine  100mg  BID, hydrochlorothiazide  25mg  every day and losartan  100mg  every day. Will stop hydrochlorothiazide . Volume - CXR pre HD with possible pulmonary edema.  Does not appear grossly overloaded. On RA.  Below EDW by weights here. Continue UF as tolerated.  Anemia - Hgb 8.7. ESA recently dosed as outpatient. No iron with high ferritin.  Continue to monitor.  Transfuse prn for Hgb<7.  Metabolic bone disease - Ca and phos  in goal. Continue home binder and VDRA. Nutrition - Renal diet with fluid restriction  DMT2 - Insulin  per primary   Manuelita Labella, PA-C [facility] 01/07/2024,8:35 AM  LOS: 2 days

## 2024-01-08 ENCOUNTER — Other Ambulatory Visit: Payer: Self-pay

## 2024-01-08 ENCOUNTER — Encounter (HOSPITAL_COMMUNITY): Payer: Self-pay | Admitting: Family Medicine

## 2024-01-08 ENCOUNTER — Inpatient Hospital Stay (HOSPITAL_COMMUNITY)

## 2024-01-08 ENCOUNTER — Inpatient Hospital Stay (HOSPITAL_COMMUNITY): Admitting: Anesthesiology

## 2024-01-08 ENCOUNTER — Inpatient Hospital Stay (HOSPITAL_COMMUNITY)
Admission: EM | Disposition: A | Discharge status: Rehabilitation Facility | Payer: Self-pay | Source: Home / Self Care | Attending: Family Medicine

## 2024-01-08 DIAGNOSIS — I5021 Acute systolic (congestive) heart failure: Secondary | ICD-10-CM

## 2024-01-08 DIAGNOSIS — E119 Type 2 diabetes mellitus without complications: Secondary | ICD-10-CM

## 2024-01-08 DIAGNOSIS — Z9911 Dependence on respirator [ventilator] status: Secondary | ICD-10-CM

## 2024-01-08 HISTORY — PX: TEE WITHOUT CARDIOVERSION: SHX5443

## 2024-01-08 HISTORY — PX: CORONARY ARTERY BYPASS GRAFT: SHX141

## 2024-01-08 LAB — POCT I-STAT 7, (LYTES, BLD GAS, ICA,H+H)
Acid-Base Excess: 1 mmol/L (ref 0.0–2.0)
Acid-Base Excess: 1 mmol/L (ref 0.0–2.0)
Acid-Base Excess: 2 mmol/L (ref 0.0–2.0)
Acid-Base Excess: 1 mmol/L (ref 0.0–2.0)
Acid-base deficit: 1 mmol/L (ref 0.0–2.0)
Acid-base deficit: 6 mmol/L — ABNORMAL HIGH (ref 0.0–2.0)
Acid-base deficit: 6 mmol/L — ABNORMAL HIGH (ref 0.0–2.0)
Bicarbonate: 25.8 mmol/L (ref 20.0–28.0)
Bicarbonate: 25.2 mmol/L (ref 20.0–28.0)
Bicarbonate: 26.3 mmol/L (ref 20.0–28.0)
Bicarbonate: 25.5 mmol/L (ref 20.0–28.0)
Bicarbonate: 23.6 mmol/L (ref 20.0–28.0)
Bicarbonate: 19.4 mmol/L — ABNORMAL LOW (ref 20.0–28.0)
Bicarbonate: 19 mmol/L — ABNORMAL LOW (ref 20.0–28.0)
Calcium, Ion: 1.08 mmol/L — ABNORMAL LOW (ref 1.15–1.40)
Calcium, Ion: 0.96 mmol/L — ABNORMAL LOW (ref 1.15–1.40)
Calcium, Ion: 1.04 mmol/L — ABNORMAL LOW (ref 1.15–1.40)
Calcium, Ion: 1.18 mmol/L (ref 1.15–1.40)
Calcium, Ion: 1.11 mmol/L — ABNORMAL LOW (ref 1.15–1.40)
Calcium, Ion: 1.08 mmol/L — ABNORMAL LOW (ref 1.15–1.40)
Calcium, Ion: 1.04 mmol/L — ABNORMAL LOW (ref 1.15–1.40)
HCT: 27 % — ABNORMAL LOW (ref 39.0–52.0)
HCT: 22 % — ABNORMAL LOW (ref 39.0–52.0)
HCT: 23 % — ABNORMAL LOW (ref 39.0–52.0)
HCT: 24 % — ABNORMAL LOW (ref 39.0–52.0)
HCT: 23 % — ABNORMAL LOW (ref 39.0–52.0)
HCT: 23 % — ABNORMAL LOW (ref 39.0–52.0)
HCT: 24 % — ABNORMAL LOW (ref 39.0–52.0)
Hemoglobin: 9.2 g/dL — ABNORMAL LOW (ref 13.0–17.0)
Hemoglobin: 7.5 g/dL — ABNORMAL LOW (ref 13.0–17.0)
Hemoglobin: 7.8 g/dL — ABNORMAL LOW (ref 13.0–17.0)
Hemoglobin: 8.2 g/dL — ABNORMAL LOW (ref 13.0–17.0)
Hemoglobin: 7.8 g/dL — ABNORMAL LOW (ref 13.0–17.0)
Hemoglobin: 7.8 g/dL — ABNORMAL LOW (ref 13.0–17.0)
Hemoglobin: 8.2 g/dL — ABNORMAL LOW (ref 13.0–17.0)
O2 Saturation: 100 %
O2 Saturation: 100 %
O2 Saturation: 100 %
O2 Saturation: 100 %
O2 Saturation: 96 %
O2 Saturation: 98 %
O2 Saturation: 99 %
Patient temperature: 98.4
Patient temperature: 97.5
Patient temperature: 98.1
Potassium: 3.8 mmol/L (ref 3.5–5.1)
Potassium: 3.9 mmol/L (ref 3.5–5.1)
Potassium: 4.6 mmol/L (ref 3.5–5.1)
Potassium: 4.4 mmol/L (ref 3.5–5.1)
Potassium: 4.4 mmol/L (ref 3.5–5.1)
Potassium: 4.4 mmol/L (ref 3.5–5.1)
Potassium: 4.2 mmol/L (ref 3.5–5.1)
Sodium: 134 mmol/L — ABNORMAL LOW (ref 135–145)
Sodium: 134 mmol/L — ABNORMAL LOW (ref 135–145)
Sodium: 134 mmol/L — ABNORMAL LOW (ref 135–145)
Sodium: 136 mmol/L (ref 135–145)
Sodium: 132 mmol/L — ABNORMAL LOW (ref 135–145)
Sodium: 136 mmol/L (ref 135–145)
Sodium: 137 mmol/L (ref 135–145)
TCO2: 27 mmol/L (ref 22–32)
TCO2: 26 mmol/L (ref 22–32)
TCO2: 28 mmol/L (ref 22–32)
TCO2: 27 mmol/L (ref 22–32)
TCO2: 25 mmol/L (ref 22–32)
TCO2: 21 mmol/L — ABNORMAL LOW (ref 22–32)
TCO2: 20 mmol/L — ABNORMAL LOW (ref 22–32)
pCO2 arterial: 40.1 mmHg (ref 32–48)
pCO2 arterial: 39.1 mmHg (ref 32–48)
pCO2 arterial: 41.5 mmHg (ref 32–48)
pCO2 arterial: 41.5 mmHg (ref 32–48)
pCO2 arterial: 38.3 mmHg (ref 32–48)
pCO2 arterial: 34.3 mmHg (ref 32–48)
pCO2 arterial: 35.5 mmHg (ref 32–48)
pH, Arterial: 7.416 (ref 7.35–7.45)
pH, Arterial: 7.418 (ref 7.35–7.45)
pH, Arterial: 7.41 (ref 7.35–7.45)
pH, Arterial: 7.396 (ref 7.35–7.45)
pH, Arterial: 7.398 (ref 7.35–7.45)
pH, Arterial: 7.359 (ref 7.35–7.45)
pH, Arterial: 7.337 — ABNORMAL LOW (ref 7.35–7.45)
pO2, Arterial: 197 mmHg — ABNORMAL HIGH (ref 83–108)
pO2, Arterial: 399 mmHg — ABNORMAL HIGH (ref 83–108)
pO2, Arterial: 361 mmHg — ABNORMAL HIGH (ref 83–108)
pO2, Arterial: 178 mmHg — ABNORMAL HIGH (ref 83–108)
pO2, Arterial: 82 mmHg — ABNORMAL LOW (ref 83–108)
pO2, Arterial: 106 mmHg (ref 83–108)
pO2, Arterial: 121 mmHg — ABNORMAL HIGH (ref 83–108)

## 2024-01-08 LAB — POCT I-STAT EG7
Acid-Base Excess: 3 mmol/L — ABNORMAL HIGH (ref 0.0–2.0)
Bicarbonate: 28 mmol/L (ref 20.0–28.0)
Calcium, Ion: 1.01 mmol/L — ABNORMAL LOW (ref 1.15–1.40)
HCT: 23 % — ABNORMAL LOW (ref 39.0–52.0)
Hemoglobin: 7.8 g/dL — ABNORMAL LOW (ref 13.0–17.0)
O2 Saturation: 76 %
Potassium: 4.6 mmol/L (ref 3.5–5.1)
Sodium: 134 mmol/L — ABNORMAL LOW (ref 135–145)
TCO2: 29 mmol/L (ref 22–32)
pCO2, Ven: 45.1 mmHg (ref 44–60)
pH, Ven: 7.401 (ref 7.25–7.43)
pO2, Ven: 41 mmHg (ref 32–45)

## 2024-01-08 LAB — CBC
HCT: 24.9 % — ABNORMAL LOW (ref 39.0–52.0)
HCT: 26 % — ABNORMAL LOW (ref 39.0–52.0)
HCT: 26.7 % — ABNORMAL LOW (ref 39.0–52.0)
Hemoglobin: 8.3 g/dL — ABNORMAL LOW (ref 13.0–17.0)
Hemoglobin: 8.5 g/dL — ABNORMAL LOW (ref 13.0–17.0)
Hemoglobin: 8.6 g/dL — ABNORMAL LOW (ref 13.0–17.0)
MCH: 30.7 pg (ref 26.0–34.0)
MCH: 30.9 pg (ref 26.0–34.0)
MCH: 30.9 pg (ref 26.0–34.0)
MCHC: 31.8 g/dL (ref 30.0–36.0)
MCHC: 33.1 g/dL (ref 30.0–36.0)
MCHC: 33.3 g/dL (ref 30.0–36.0)
MCV: 92.6 fL (ref 80.0–100.0)
MCV: 93.5 fL (ref 80.0–100.0)
MCV: 96.4 fL (ref 80.0–100.0)
Platelets: 216 K/uL (ref 150–400)
Platelets: 246 K/uL (ref 150–400)
Platelets: 246 K/uL (ref 150–400)
RBC: 2.69 MIL/uL — ABNORMAL LOW (ref 4.22–5.81)
RBC: 2.77 MIL/uL — ABNORMAL LOW (ref 4.22–5.81)
RBC: 2.78 MIL/uL — ABNORMAL LOW (ref 4.22–5.81)
RDW: 14.9 % (ref 11.5–15.5)
RDW: 14.9 % (ref 11.5–15.5)
RDW: 15 % (ref 11.5–15.5)
WBC: 10.4 K/uL (ref 4.0–10.5)
WBC: 11.4 K/uL — ABNORMAL HIGH (ref 4.0–10.5)
WBC: 17.5 K/uL — ABNORMAL HIGH (ref 4.0–10.5)
nRBC: 0 % (ref 0.0–0.2)
nRBC: 0 % (ref 0.0–0.2)
nRBC: 0 % (ref 0.0–0.2)

## 2024-01-08 LAB — POCT I-STAT, CHEM 8
BUN: 38 mg/dL — ABNORMAL HIGH (ref 8–23)
BUN: 35 mg/dL — ABNORMAL HIGH (ref 8–23)
BUN: 35 mg/dL — ABNORMAL HIGH (ref 8–23)
BUN: 36 mg/dL — ABNORMAL HIGH (ref 8–23)
BUN: 36 mg/dL — ABNORMAL HIGH (ref 8–23)
BUN: 38 mg/dL — ABNORMAL HIGH (ref 8–23)
Calcium, Ion: 1.04 mmol/L — ABNORMAL LOW (ref 1.15–1.40)
Calcium, Ion: 1.06 mmol/L — ABNORMAL LOW (ref 1.15–1.40)
Calcium, Ion: 1.05 mmol/L — ABNORMAL LOW (ref 1.15–1.40)
Calcium, Ion: 1.03 mmol/L — ABNORMAL LOW (ref 1.15–1.40)
Calcium, Ion: 1.03 mmol/L — ABNORMAL LOW (ref 1.15–1.40)
Calcium, Ion: 1.2 mmol/L (ref 1.15–1.40)
Chloride: 96 mmol/L — ABNORMAL LOW (ref 98–111)
Chloride: 96 mmol/L — ABNORMAL LOW (ref 98–111)
Chloride: 97 mmol/L — ABNORMAL LOW (ref 98–111)
Chloride: 96 mmol/L — ABNORMAL LOW (ref 98–111)
Chloride: 97 mmol/L — ABNORMAL LOW (ref 98–111)
Chloride: 99 mmol/L (ref 98–111)
Creatinine, Ser: 4.3 mg/dL — ABNORMAL HIGH (ref 0.61–1.24)
Creatinine, Ser: 4.5 mg/dL — ABNORMAL HIGH (ref 0.61–1.24)
Creatinine, Ser: 4.5 mg/dL — ABNORMAL HIGH (ref 0.61–1.24)
Creatinine, Ser: 4.6 mg/dL — ABNORMAL HIGH (ref 0.61–1.24)
Creatinine, Ser: 4.5 mg/dL — ABNORMAL HIGH (ref 0.61–1.24)
Creatinine, Ser: 4.5 mg/dL — ABNORMAL HIGH (ref 0.61–1.24)
Glucose, Bld: 117 mg/dL — ABNORMAL HIGH (ref 70–99)
Glucose, Bld: 163 mg/dL — ABNORMAL HIGH (ref 70–99)
Glucose, Bld: 145 mg/dL — ABNORMAL HIGH (ref 70–99)
Glucose, Bld: 136 mg/dL — ABNORMAL HIGH (ref 70–99)
Glucose, Bld: 127 mg/dL — ABNORMAL HIGH (ref 70–99)
Glucose, Bld: 129 mg/dL — ABNORMAL HIGH (ref 70–99)
HCT: 29 % — ABNORMAL LOW (ref 39.0–52.0)
HCT: 26 % — ABNORMAL LOW (ref 39.0–52.0)
HCT: 24 % — ABNORMAL LOW (ref 39.0–52.0)
HCT: 22 % — ABNORMAL LOW (ref 39.0–52.0)
HCT: 21 % — ABNORMAL LOW (ref 39.0–52.0)
HCT: 25 % — ABNORMAL LOW (ref 39.0–52.0)
Hemoglobin: 9.9 g/dL — ABNORMAL LOW (ref 13.0–17.0)
Hemoglobin: 8.8 g/dL — ABNORMAL LOW (ref 13.0–17.0)
Hemoglobin: 8.2 g/dL — ABNORMAL LOW (ref 13.0–17.0)
Hemoglobin: 7.5 g/dL — ABNORMAL LOW (ref 13.0–17.0)
Hemoglobin: 7.1 g/dL — ABNORMAL LOW (ref 13.0–17.0)
Hemoglobin: 8.5 g/dL — ABNORMAL LOW (ref 13.0–17.0)
Potassium: 3.6 mmol/L (ref 3.5–5.1)
Potassium: 3.8 mmol/L (ref 3.5–5.1)
Potassium: 3.6 mmol/L (ref 3.5–5.1)
Potassium: 4.3 mmol/L (ref 3.5–5.1)
Potassium: 4.8 mmol/L (ref 3.5–5.1)
Potassium: 4.5 mmol/L (ref 3.5–5.1)
Sodium: 134 mmol/L — ABNORMAL LOW (ref 135–145)
Sodium: 133 mmol/L — ABNORMAL LOW (ref 135–145)
Sodium: 134 mmol/L — ABNORMAL LOW (ref 135–145)
Sodium: 133 mmol/L — ABNORMAL LOW (ref 135–145)
Sodium: 134 mmol/L — ABNORMAL LOW (ref 135–145)
Sodium: 136 mmol/L (ref 135–145)
TCO2: 27 mmol/L (ref 22–32)
TCO2: 27 mmol/L (ref 22–32)
TCO2: 27 mmol/L (ref 22–32)
TCO2: 28 mmol/L (ref 22–32)
TCO2: 27 mmol/L (ref 22–32)
TCO2: 24 mmol/L (ref 22–32)

## 2024-01-08 LAB — BASIC METABOLIC PANEL WITH GFR
Anion gap: 13 (ref 5–15)
BUN: 71 mg/dL — ABNORMAL HIGH (ref 8–23)
CO2: 22 mmol/L (ref 22–32)
Calcium: 8 mg/dL — ABNORMAL LOW (ref 8.9–10.3)
Chloride: 96 mmol/L — ABNORMAL LOW (ref 98–111)
Creatinine, Ser: 7.18 mg/dL — ABNORMAL HIGH (ref 0.61–1.24)
GFR, Estimated: 8 mL/min — ABNORMAL LOW (ref >60)
Glucose, Bld: 320 mg/dL — ABNORMAL HIGH (ref 70–99)
Potassium: 4.3 mmol/L (ref 3.5–5.1)
Sodium: 131 mmol/L — ABNORMAL LOW (ref 135–145)

## 2024-01-08 LAB — RENAL FUNCTION PANEL
Albumin: 2.6 g/dL — ABNORMAL LOW (ref 3.5–5.0)
Anion gap: 13 (ref 5–15)
BUN: 74 mg/dL — ABNORMAL HIGH (ref 8–23)
CO2: 25 mmol/L (ref 22–32)
Calcium: 8.1 mg/dL — ABNORMAL LOW (ref 8.9–10.3)
Chloride: 96 mmol/L — ABNORMAL LOW (ref 98–111)
Creatinine, Ser: 7.49 mg/dL — ABNORMAL HIGH (ref 0.61–1.24)
GFR, Estimated: 7 mL/min — ABNORMAL LOW (ref >60)
Glucose, Bld: 180 mg/dL — ABNORMAL HIGH (ref 70–99)
Phosphorus: 5.8 mg/dL — ABNORMAL HIGH (ref 2.5–4.6)
Potassium: 4.1 mmol/L (ref 3.5–5.1)
Sodium: 134 mmol/L — ABNORMAL LOW (ref 135–145)

## 2024-01-08 LAB — GLUCOSE, CAPILLARY
Glucose-Capillary: 201 mg/dL — ABNORMAL HIGH (ref 70–99)
Glucose-Capillary: 118 mg/dL — ABNORMAL HIGH (ref 70–99)
Glucose-Capillary: 114 mg/dL — ABNORMAL HIGH (ref 70–99)
Glucose-Capillary: 128 mg/dL — ABNORMAL HIGH (ref 70–99)
Glucose-Capillary: 143 mg/dL — ABNORMAL HIGH (ref 70–99)

## 2024-01-08 LAB — HEMOGLOBIN AND HEMATOCRIT, BLOOD
HCT: 22.9 % — ABNORMAL LOW (ref 39.0–52.0)
Hemoglobin: 7.5 g/dL — ABNORMAL LOW (ref 13.0–17.0)

## 2024-01-08 LAB — COOXEMETRY PANEL
Carboxyhemoglobin: 0.4 % — ABNORMAL LOW (ref 0.5–1.5)
Methemoglobin: <0.7 % (ref 0.0–1.5)
O2 Saturation: 56.8 %
Total hemoglobin: 7.7 g/dL — ABNORMAL LOW (ref 12.0–16.0)

## 2024-01-08 LAB — HEPARIN LEVEL (UNFRACTIONATED): Heparin Unfractionated: 0.37 [IU]/mL (ref 0.30–0.70)

## 2024-01-08 LAB — PROTIME-INR
INR: 1.3 — ABNORMAL HIGH (ref 0.8–1.2)
Prothrombin Time: 16.4 s — ABNORMAL HIGH (ref 11.4–15.2)

## 2024-01-08 LAB — PLATELET COUNT: Platelets: 231 K/uL (ref 150–400)

## 2024-01-08 LAB — APTT: aPTT: 45 s — ABNORMAL HIGH (ref 24–36)

## 2024-01-08 LAB — PREPARE RBC (CROSSMATCH)

## 2024-01-08 SURGERY — CORONARY ARTERY BYPASS GRAFTING (CABG)
Anesthesia: General | Site: Chest

## 2024-01-08 MED ORDER — PANTOPRAZOLE SODIUM 40 MG PO TBEC
40.0000 mg | DELAYED_RELEASE_TABLET | Freq: Every day | ORAL | Status: DC
  Filled 2024-01-08: qty 1

## 2024-01-08 MED ORDER — LIDOCAINE HCL (PF) 1 % IJ SOLN: 5.0000 mL | INTRAMUSCULAR | Status: DC | PRN

## 2024-01-08 MED ORDER — ACETAMINOPHEN 160 MG/5ML PO SOLN
650.0000 mg | Freq: Once | ORAL | Status: AC
  Administered 2024-01-08: 650 mg
  Filled 2024-01-08: qty 20.3

## 2024-01-08 MED ORDER — LACTATED RINGERS IV SOLN: INTRAVENOUS | Status: AC

## 2024-01-08 MED ORDER — ANTICOAGULANT SODIUM CITRATE 4% (200MG/5ML) IV SOLN: 5.0000 mL | Status: DC | PRN

## 2024-01-08 MED ORDER — HEMOSTATIC AGENTS (NO CHARGE) OPTIME
TOPICAL | Status: DC | PRN
  Administered 2024-01-08: 1 via TOPICAL

## 2024-01-08 MED ORDER — SODIUM CHLORIDE 0.9 % IV SOLN: INTRAVENOUS | Status: AC

## 2024-01-08 MED ORDER — TRANEXAMIC ACID 1000 MG/10ML IV SOLN
1.5000 mg/kg/h | INTRAVENOUS | Status: DC
  Filled 2024-01-08: qty 25

## 2024-01-08 MED ORDER — INSULIN REGULAR(HUMAN) IN NACL 100-0.9 UT/100ML-% IV SOLN: INTRAVENOUS | Status: DC

## 2024-01-08 MED ORDER — MORPHINE SULFATE (PF) 2 MG/ML IV SOLN: 1.0000 mg | INTRAVENOUS | Status: DC | PRN

## 2024-01-08 MED ORDER — VANCOMYCIN HCL IN DEXTROSE 1-5 GM/200ML-% IV SOLN: 1000.0000 mg | Freq: Once | INTRAVENOUS | Status: DC

## 2024-01-08 MED ORDER — MIDAZOLAM HCL 2 MG/2ML IJ SOLN
INTRAMUSCULAR | Status: AC
  Administered 2024-01-08: 2 mg via INTRAVENOUS
  Filled 2024-01-08: qty 2

## 2024-01-08 MED ORDER — VANCOMYCIN HCL IN DEXTROSE 1-5 GM/200ML-% IV SOLN
1000.0000 mg | Freq: Once | INTRAVENOUS | Status: AC
  Administered 2024-01-09: 1000 mg via INTRAVENOUS
  Filled 2024-01-08: qty 200

## 2024-01-08 MED ORDER — PENTAFLUOROPROP-TETRAFLUOROETH EX AERO: 1.0000 | INHALATION_SPRAY | CUTANEOUS | Status: DC | PRN

## 2024-01-08 MED ORDER — CHLORHEXIDINE GLUCONATE 0.12 % MT SOLN
15.0000 mL | OROMUCOSAL | Status: AC
  Administered 2024-01-08: 15 mL via OROMUCOSAL
  Filled 2024-01-08: qty 15

## 2024-01-08 MED ORDER — LIDOCAINE-PRILOCAINE 2.5-2.5 % EX CREA: 1.0000 | TOPICAL_CREAM | CUTANEOUS | Status: DC | PRN

## 2024-01-08 MED ORDER — ROCURONIUM BROMIDE 100 MG/10ML IV SOLN
INTRAVENOUS | Status: DC | PRN
  Administered 2024-01-08: 70 mg via INTRAVENOUS
  Administered 2024-01-08: 10 mg via INTRAVENOUS
  Administered 2024-01-08: 50 mg via INTRAVENOUS
  Administered 2024-01-08: 30 mg via INTRAVENOUS

## 2024-01-08 MED ORDER — TRAMADOL HCL 50 MG PO TABS: 50.0000 mg | ORAL_TABLET | ORAL | Status: DC | PRN

## 2024-01-08 MED ORDER — METOPROLOL TARTRATE 5 MG/5ML IV SOLN: 2.5000 mg | INTRAVENOUS | Status: DC | PRN

## 2024-01-08 MED ORDER — ASPIRIN 325 MG PO TBEC
325.0000 mg | DELAYED_RELEASE_TABLET | Freq: Every day | ORAL | Status: DC
  Administered 2024-01-09: 325 mg via ORAL
  Filled 2024-01-08 (×2): qty 1

## 2024-01-08 MED ORDER — MORPHINE SULFATE (PF) 2 MG/ML IV SOLN
INTRAVENOUS | Status: DC
  Filled 2024-01-08: qty 2

## 2024-01-08 MED ORDER — ONDANSETRON HCL 4 MG/2ML IJ SOLN: 4.0000 mg | Freq: Four times a day (QID) | INTRAMUSCULAR | Status: DC | PRN

## 2024-01-08 MED ORDER — NOREPINEPHRINE 4 MG/250ML-% IV SOLN
0.0000 ug/min | INTRAVENOUS | Status: DC
  Administered 2024-01-09: 12 ug/min via INTRAVENOUS
  Administered 2024-01-10: 9 ug/min via INTRAVENOUS
  Administered 2024-01-10: 8 ug/min via INTRAVENOUS
  Filled 2024-01-08 (×4): qty 250

## 2024-01-08 MED ORDER — HEPARIN SODIUM (PORCINE) 1000 UNIT/ML DIALYSIS: 1000.0000 [IU] | INTRAMUSCULAR | Status: DC | PRN

## 2024-01-08 MED ORDER — DEXMEDETOMIDINE HCL IN NACL 400 MCG/100ML IV SOLN: 0.0000 ug/kg/h | INTRAVENOUS | Status: DC

## 2024-01-08 MED ORDER — SODIUM CHLORIDE 0.45 % IV SOLN: INTRAVENOUS | Status: AC | PRN

## 2024-01-08 MED ORDER — FENTANYL CITRATE (PF) 100 MCG/2ML IJ SOLN
INTRAMUSCULAR | Status: DC | PRN
  Administered 2024-01-08 (×4): 100 ug via INTRAVENOUS
  Administered 2024-01-08: 200 ug via INTRAVENOUS

## 2024-01-08 MED ORDER — SODIUM CHLORIDE (PF) 0.9 % IJ SOLN
OROMUCOSAL | Status: DC | PRN
  Administered 2024-01-08 (×2): 4 mL via TOPICAL

## 2024-01-08 MED ORDER — BISACODYL 5 MG PO TBEC
10.0000 mg | DELAYED_RELEASE_TABLET | Freq: Every day | ORAL | Status: DC
  Administered 2024-01-09 – 2024-01-18 (×6): 10 mg via ORAL
  Filled 2024-01-08 (×7): qty 2

## 2024-01-08 MED ORDER — FENTANYL CITRATE (PF) 250 MCG/5ML IJ SOLN
INTRAMUSCULAR | Status: AC
  Filled 2024-01-08: qty 5

## 2024-01-08 MED ORDER — PROTAMINE SULFATE 10 MG/ML IV SOLN
INTRAVENOUS | Status: DC | PRN
  Administered 2024-01-08: 40 mg via INTRAVENOUS

## 2024-01-08 MED ORDER — PROPOFOL 10 MG/ML IV BOLUS
INTRAVENOUS | Status: DC | PRN
  Administered 2024-01-08: 30 mg via INTRAVENOUS
  Administered 2024-01-08: 50 mg via INTRAVENOUS

## 2024-01-08 MED ORDER — OXYCODONE HCL 5 MG PO TABS
5.0000 mg | ORAL_TABLET | ORAL | Status: DC | PRN
  Administered 2024-01-09 (×3): 10 mg via ORAL
  Administered 2024-01-10: 5 mg via ORAL
  Filled 2024-01-08 (×2): qty 2
  Filled 2024-01-08: qty 1
  Filled 2024-01-08 (×2): qty 2

## 2024-01-08 MED ORDER — MIDAZOLAM HCL (PF) 5 MG/ML IJ SOLN
INTRAMUSCULAR | Status: DC | PRN
  Administered 2024-01-08: 2 mg via INTRAVENOUS

## 2024-01-08 MED ORDER — ASPIRIN 81 MG PO CHEW
324.0000 mg | CHEWABLE_TABLET | Freq: Every day | ORAL | Status: DC
  Administered 2024-01-11: 324 mg
  Filled 2024-01-08: qty 4

## 2024-01-08 MED ORDER — LIDOCAINE HCL (CARDIAC) PF 100 MG/5ML IV SOSY
PREFILLED_SYRINGE | INTRAVENOUS | Status: DC | PRN
  Administered 2024-01-08: 60 mg via INTRAVENOUS

## 2024-01-08 MED ORDER — METOCLOPRAMIDE HCL 5 MG/ML IJ SOLN
10.0000 mg | Freq: Four times a day (QID) | INTRAMUSCULAR | Status: AC
  Administered 2024-01-08 – 2024-01-10 (×6): 10 mg via INTRAVENOUS
  Filled 2024-01-08 (×6): qty 2

## 2024-01-08 MED ORDER — SODIUM CHLORIDE 0.9% FLUSH: 3.0000 mL | INTRAVENOUS | Status: DC | PRN

## 2024-01-08 MED ORDER — SODIUM CHLORIDE 0.9 % IV SOLN: 250.0000 mL | INTRAVENOUS | Status: AC

## 2024-01-08 MED ORDER — DOCUSATE SODIUM 100 MG PO CAPS
200.0000 mg | ORAL_CAPSULE | Freq: Every day | ORAL | Status: DC
  Administered 2024-01-09: 200 mg via ORAL
  Filled 2024-01-08 (×2): qty 2

## 2024-01-08 MED ORDER — ALBUMIN HUMAN 5 % IV SOLN: INTRAVENOUS | Status: DC | PRN

## 2024-01-08 MED ORDER — SODIUM CHLORIDE 0.9% FLUSH
3.0000 mL | Freq: Two times a day (BID) | INTRAVENOUS | Status: DC
  Administered 2024-01-09 – 2024-01-15 (×14): 3 mL via INTRAVENOUS

## 2024-01-08 MED ORDER — BISACODYL 10 MG RE SUPP: 10.0000 mg | Freq: Every day | RECTAL | Status: DC

## 2024-01-08 MED ORDER — SODIUM CHLORIDE 0.9 % IV SOLN: INTRAVENOUS | Status: DC

## 2024-01-08 MED ORDER — CHLORHEXIDINE GLUCONATE 0.12 % MT SOLN: 15.0000 mL | Freq: Once | OROMUCOSAL | Status: DC

## 2024-01-08 MED ORDER — METOPROLOL TARTRATE 25 MG/10 ML ORAL SUSPENSION: 12.5000 mg | Freq: Two times a day (BID) | ORAL | Status: DC

## 2024-01-08 MED ORDER — MAGNESIUM SULFATE 4 GM/100ML IV SOLN
4.0000 g | Freq: Once | INTRAVENOUS | Status: AC
  Administered 2024-01-08: 4 g via INTRAVENOUS
  Filled 2024-01-08: qty 100

## 2024-01-08 MED ORDER — LACTATED RINGERS IV SOLN: INTRAVENOUS | Status: DC | PRN

## 2024-01-08 MED ORDER — TRAMADOL HCL 50 MG PO TABS
50.0000 mg | ORAL_TABLET | Freq: Three times a day (TID) | ORAL | Status: DC | PRN
  Administered 2024-01-08: 50 mg
  Administered 2024-01-09: 100 mg
  Filled 2024-01-08: qty 1
  Filled 2024-01-08 (×2): qty 2

## 2024-01-08 MED ORDER — PANTOPRAZOLE SODIUM 40 MG IV SOLR
40.0000 mg | Freq: Every day | INTRAVENOUS | Status: AC
  Administered 2024-01-08 – 2024-01-09 (×2): 40 mg via INTRAVENOUS
  Filled 2024-01-08 (×2): qty 10

## 2024-01-08 MED ORDER — MIDAZOLAM HCL 2 MG/2ML IJ SOLN
INTRAMUSCULAR | Status: AC
  Filled 2024-01-08: qty 2

## 2024-01-08 MED ORDER — POTASSIUM CHLORIDE 10 MEQ/50ML IV SOLN: 10.0000 meq | INTRAVENOUS | Status: AC

## 2024-01-08 MED ORDER — ACETAMINOPHEN 500 MG PO TABS
1000.0000 mg | ORAL_TABLET | Freq: Four times a day (QID) | ORAL | Status: AC
  Administered 2024-01-09 – 2024-01-13 (×14): 1000 mg via ORAL
  Filled 2024-01-08 (×15): qty 2

## 2024-01-08 MED ORDER — METOPROLOL TARTRATE 12.5 MG HALF TABLET: 12.5000 mg | ORAL_TABLET | Freq: Two times a day (BID) | ORAL | Status: DC

## 2024-01-08 MED ORDER — HEPARIN SODIUM (PORCINE) 1000 UNIT/ML IJ SOLN
3200.0000 [IU] | Freq: Once | INTRAMUSCULAR | Status: AC
  Administered 2024-01-08: 3200 [IU]

## 2024-01-08 MED ORDER — ACETAMINOPHEN 160 MG/5ML PO SOLN
1000.0000 mg | Freq: Four times a day (QID) | ORAL | Status: AC
  Administered 2024-01-10 – 2024-01-11 (×2): 1000 mg
  Filled 2024-01-08 (×2): qty 40.6

## 2024-01-08 MED ORDER — SUGAMMADEX SODIUM 200 MG/2ML IV SOLN
INTRAVENOUS | Status: DC | PRN
  Administered 2024-01-08: 200 mg via INTRAVENOUS

## 2024-01-08 MED ORDER — PROPOFOL 10 MG/ML IV BOLUS
INTRAVENOUS | Status: AC
  Filled 2024-01-08: qty 20

## 2024-01-08 MED ORDER — ASPIRIN 81 MG PO CHEW
324.0000 mg | CHEWABLE_TABLET | Freq: Once | ORAL | Status: AC
  Administered 2024-01-08: 324 mg via ORAL
  Filled 2024-01-08: qty 4

## 2024-01-08 MED ORDER — CLEVIDIPINE BUTYRATE 0.5 MG/ML IV EMUL
INTRAVENOUS | Status: DC | PRN
  Administered 2024-01-08: 2 mg/h via INTRAVENOUS

## 2024-01-08 MED ORDER — HEPARIN SODIUM (PORCINE) 1000 UNIT/ML IJ SOLN
INTRAMUSCULAR | Status: DC | PRN
  Administered 2024-01-08: 50000 [IU] via INTRAVENOUS

## 2024-01-08 MED ORDER — ORAL CARE MOUTH RINSE: 15.0000 mL | Freq: Once | OROMUCOSAL | Status: DC

## 2024-01-08 MED ORDER — MIDAZOLAM HCL (PF) 2 MG/2ML IJ SOLN: 2.0000 mg | INTRAMUSCULAR | Status: DC | PRN

## 2024-01-08 MED ORDER — HEPARIN SODIUM (PORCINE) 1000 UNIT/ML IJ SOLN
INTRAMUSCULAR | Status: AC
  Filled 2024-01-08: qty 4

## 2024-01-08 MED ORDER — PHENYLEPHRINE 80 MCG/ML (10ML) SYRINGE FOR IV PUSH (FOR BLOOD PRESSURE SUPPORT)
PREFILLED_SYRINGE | INTRAVENOUS | Status: DC | PRN
  Administered 2024-01-08: 160 ug via INTRAVENOUS

## 2024-01-08 MED ORDER — LACTATED RINGERS IV SOLN: INTRAVENOUS | Status: DC

## 2024-01-08 MED ORDER — ALTEPLASE 2 MG IJ SOLR: 2.0000 mg | Freq: Once | INTRAMUSCULAR | Status: DC | PRN

## 2024-01-08 MED ORDER — FENTANYL CITRATE (PF) 50 MCG/ML IJ SOSY
25.0000 ug | PREFILLED_SYRINGE | INTRAMUSCULAR | Status: DC | PRN
  Administered 2024-01-08: 50 ug via INTRAVENOUS
  Filled 2024-01-08: qty 1

## 2024-01-08 MED ORDER — CEFAZOLIN SODIUM-DEXTROSE 2-4 GM/100ML-% IV SOLN
2.0000 g | INTRAVENOUS | Status: DC
  Administered 2024-01-08: 2 g via INTRAVENOUS
  Filled 2024-01-08: qty 100

## 2024-01-08 MED ORDER — SUCCINYLCHOLINE CHLORIDE 200 MG/10ML IV SOSY
PREFILLED_SYRINGE | INTRAVENOUS | Status: DC | PRN
  Administered 2024-01-08: 140 mg via INTRAVENOUS

## 2024-01-08 MED ORDER — DEXTROSE 50 % IV SOLN: 0.0000 mL | INTRAVENOUS | Status: DC | PRN

## 2024-01-08 MED ORDER — DEXMEDETOMIDINE HCL IN NACL 400 MCG/100ML IV SOLN
INTRAVENOUS | Status: AC
  Administered 2024-01-08: 0.7 ug/kg/h via INTRAVENOUS
  Filled 2024-01-08: qty 100

## 2024-01-08 MED ORDER — EPHEDRINE SULFATE (PRESSORS) 25 MG/5ML IV SOSY
PREFILLED_SYRINGE | INTRAVENOUS | Status: DC | PRN
  Administered 2024-01-08: 10 mg via INTRAVENOUS

## 2024-01-08 MED ORDER — ALBUMIN HUMAN 5 % IV SOLN
250.0000 mL | INTRAVENOUS | Status: DC | PRN
  Administered 2024-01-08 (×2): 12.5 g via INTRAVENOUS
  Filled 2024-01-08: qty 250

## 2024-01-08 MED ORDER — SODIUM CHLORIDE 0.9% IV SOLUTION: Freq: Once | INTRAVENOUS | Status: DC

## 2024-01-08 SURGICAL SUPPLY — 73 items
ADAPTER MULTI PERFUSION 15 (ADAPTER) ×2 IMPLANT
BAG DECANTER FOR FLEXI CONT (MISCELLANEOUS) ×2 IMPLANT
BLADE CLIPPER SURG (BLADE) ×2 IMPLANT
BLADE STERNUM SYSTEM 6 (BLADE) ×2 IMPLANT
BLOOD HAEMOCONCENTR 700 MIDI (MISCELLANEOUS) IMPLANT
BNDG ELASTIC 4X5.8 VLCR STR LF (GAUZE/BANDAGES/DRESSINGS) ×2 IMPLANT
BNDG ELASTIC 6INX 5YD STR LF (GAUZE/BANDAGES/DRESSINGS) ×2 IMPLANT
BNDG GAUZE DERMACEA FLUFF 4 (GAUZE/BANDAGES/DRESSINGS) ×2 IMPLANT
CANISTER SUCTION 3000ML PPV (SUCTIONS) ×2 IMPLANT
CANNULA AORTIC ROOT 9FR (CANNULA) ×2 IMPLANT
CANNULA MC2 2 STG 29/37 NON-V (CANNULA) ×2 IMPLANT
CANNULA MC2 2 STG 32/40 NON-V (CANNULA) IMPLANT
CANNULA NON VENT 20FR 12 (CANNULA) IMPLANT
CANNULA NON VENT 22FR 12 (CANNULA) IMPLANT
CATH ROBINSON RED A/P 18FR (CATHETERS) ×4 IMPLANT
CLIP TI MEDIUM 24 (CLIP) IMPLANT
CONNECTOR BLAKE 2:1 CARIO BLK (MISCELLANEOUS) ×2 IMPLANT
CONTAINER PROTECT SURGISLUSH (MISCELLANEOUS) ×4 IMPLANT
DERMABOND ADVANCED .7 DNX12 (GAUZE/BANDAGES/DRESSINGS) IMPLANT
DRAIN CHANNEL 19F RND (DRAIN) ×4 IMPLANT
DRAIN CONNECTOR BLAKE 1:1 (MISCELLANEOUS) IMPLANT
DRAPE SRG 135X102X78XABS (DRAPES) ×2 IMPLANT
DRAPE WARM FLUID 44X44 (DRAPES) ×2 IMPLANT
DRESSING AQUACEL AG SP 3.5X10 (GAUZE/BANDAGES/DRESSINGS) IMPLANT
DRSG AQUACEL AG ADV 3.5X10 (GAUZE/BANDAGES/DRESSINGS) ×2 IMPLANT
ELECTRODE BLDE 4.0 EZ CLN MEGD (MISCELLANEOUS) ×2 IMPLANT
ELECTRODE REM PT RTRN 9FT ADLT (ELECTROSURGICAL) ×4 IMPLANT
FELT TEFLON 1X6 (MISCELLANEOUS) ×2 IMPLANT
GAUZE SPONGE 4X4 12PLY STRL (GAUZE/BANDAGES/DRESSINGS) ×4 IMPLANT
GLOVE BIO SURGEON STRL SZ7 (GLOVE) IMPLANT
GLOVE BIOGEL M STRL SZ7.5 (GLOVE) IMPLANT
GOWN STRL REUS W/ TWL LRG LVL3 (GOWN DISPOSABLE) ×8 IMPLANT
GOWN STRL REUS W/ TWL XL LVL3 (GOWN DISPOSABLE) ×4 IMPLANT
GOWN STRL SURGICAL XL XLNG (GOWN DISPOSABLE) ×4 IMPLANT
HEMOSTAT POWDER SURGIFOAM 1G (HEMOSTASIS) ×4 IMPLANT
HEMOSTAT SURGICEL 2X14 (HEMOSTASIS) IMPLANT
INSERT FOGARTY XLG (MISCELLANEOUS) ×2 IMPLANT
INSERT SUTURE HOLDER (MISCELLANEOUS) ×2 IMPLANT
KIT BASIN OR (CUSTOM PROCEDURE TRAY) ×2 IMPLANT
KIT TURNOVER KIT B (KITS) ×2 IMPLANT
KIT VASOVIEW HEMOPRO 2 VH 4000 (KITS) ×2 IMPLANT
LEAD PACING MYOCARDI (MISCELLANEOUS) ×2 IMPLANT
MARKER DISTAL GRAFT W/ HOLDER (MISCELLANEOUS) ×6 IMPLANT
PACK E OPEN HEART (SUTURE) ×2 IMPLANT
PACK OPEN HEART (CUSTOM PROCEDURE TRAY) ×2 IMPLANT
PAD ARMBOARD POSITIONER FOAM (MISCELLANEOUS) ×4 IMPLANT
PAD ELECT DEFIB RADIOL ZOLL (MISCELLANEOUS) ×2 IMPLANT
PENCIL BUTTON HOLSTER BLD 10FT (ELECTRODE) ×2 IMPLANT
POSITIONER HEAD DONUT 9IN (MISCELLANEOUS) ×2 IMPLANT
PUNCH AORTIC ROTATE 4.0MM (MISCELLANEOUS) ×2 IMPLANT
SET MPS 3-ND DEL (MISCELLANEOUS) IMPLANT
SOLN 0.9% NACL POUR BTL 1000ML (IV SOLUTION) ×10 IMPLANT
SOLN STERILE WATER BTL 1000 ML (IV SOLUTION) ×4 IMPLANT
SUPPORT HEART JANKE-BARRON (MISCELLANEOUS) ×2 IMPLANT
SUT ETHIBOND X763 2 0 SH 1 (SUTURE) ×4 IMPLANT
SUT MNCRL AB 3-0 PS2 18 (SUTURE) ×4 IMPLANT
SUT MNCRL AB 4-0 PS2 18 (SUTURE) IMPLANT
SUT PDS AB 1 CTX 36 (SUTURE) ×4 IMPLANT
SUT PROLENE 4 0 SH DA (SUTURE) ×2 IMPLANT
SUT PROLENE 5 0 C 1 36 (SUTURE) ×6 IMPLANT
SUT PROLENE 7 0 BV 1 (SUTURE) IMPLANT
SUT PROLENE 7 0 BV1 MDA (SUTURE) ×2 IMPLANT
SUT STEEL 6MS V (SUTURE) IMPLANT
SUT STEEL STERNAL CCS#1 18IN (SUTURE) IMPLANT
SUT VIC AB 2-0 CT1 TAPERPNT 27 (SUTURE) IMPLANT
SYSTEM SAHARA CHEST DRAIN ATS (WOUND CARE) ×2 IMPLANT
TOWEL GREEN STERILE (TOWEL DISPOSABLE) ×2 IMPLANT
TOWEL GREEN STERILE FF (TOWEL DISPOSABLE) ×2 IMPLANT
TRAY FOLEY SLVR 16FR TEMP STAT (SET/KITS/TRAYS/PACK) ×2 IMPLANT
TUBE SUCT INTRACARD DLP 20F (MISCELLANEOUS) ×2 IMPLANT
TUBE SUCTION CARDIAC 10FR (CANNULA) ×2 IMPLANT
TUBING LAP HI FLOW INSUFFLATIO (TUBING) ×2 IMPLANT
UNDERPAD 30X36 HEAVY ABSORB (UNDERPADS AND DIAPERS) ×2 IMPLANT

## 2024-01-08 NOTE — Progress Notes (Signed)
 PROGRESS NOTE    Timothy Holloway  FMW:[id number] DOB: September 25, 1955 DOA: 01/05/2024 PCP: Timothy Holloway    Brief Narrative:  68 year old ESRD on hemodialysis, type 2 diabetes, hypertension, obesity, chronic anemia developed precordial chest pain and pressure sensation while receiving dialysis.  Brought to the emergency room.  Troponins were Elevated.  Started on heparin  infusion and transferred to [facility] for cardiac cath. Underwent cardiac cath, found to have triple-vessel disease and congestive heart failure.  Subjective: Seen at HD receiving dialysis.  Patient for CABG today.  Denies any chest pain or shortness of breath.   Assessment & Plan:   Coronary artery disease, non-STEMI:  Cardiac cath 12/5, triple-vessel disease Ejection fraction 40 to 45% Currently no active chest pain.  Remains on heparin  infusion. Aspirin , Lipitor, carvedilol , fenofibrate , Imdur .  Cardiology following.  CT surgery following, anticipating CABG today afternoon.  ESRD on hemodialysis, received dialysis.  Nephrology following.   Type 2 diabetes on insulin : Well-controlled.  Resumed home dose of insulin .  Hypertension: Blood pressure stable on current regimen carvedilol  and Imdur .  Patient will transfer to CTVS service after surgery.    DVT prophylaxis: SCDs   Code Status: Full code Family Communication: None at bedside Disposition Plan: Status is: Inpatient Remains inpatient appropriate because: Active treatment, anticipate surgery today.     Consultants:  Cardiology Nephrology Cardiothoracic surgery  Procedures:  Cardiac cath 12/5  Antimicrobials:  None     Objective: Vitals:   01/08/24 0900 01/08/24 0930 01/08/24 1000 01/08/24 1030  BP: (!) 137/46 (!) 143/60 138/66 (!) 155/64  Pulse: (!) 49 (!) 47 (!) 47 (!) 50  Resp: 13 13 11 12   Temp:      TempSrc:      SpO2: 96% 98% 100% 100%  Weight:      Height:        Intake/Output Summary (Last 24 hours)  at 01/08/2024 1042 Last data filed at 01/08/2024 0400 Gross per 24 hour  Intake 959.86 ml  Output 250 ml  Net 709.86 ml   Filed Weights   01/05/24 1011 01/08/24 0715 01/08/24 0730  Weight: 131.5 kg 134 kg 133.2 kg    Examination:  General exam: Comfortable.  On room air.  Pleasant and interactive. Respiratory system: Clear to auscultation. Respiratory effort normal. Right IJ permacath present. Cardiovascular system: S1 & S2 heard, RRR.  Gastrointestinal system: Abdomen is nondistended, soft and nontender. No organomegaly or masses felt. Normal bowel sounds heard. Central nervous system: Alert and oriented. No focal neurological deficits.    Data Reviewed: I have personally reviewed following labs and imaging studies  CBC: Recent Labs  Lab 01/05/24 1018 01/06/24 0417 01/07/24 0454 01/07/24 2355 01/08/24 0653  WBC 12.2* 12.6* 11.1* 10.4 11.4*  HGB 9.6* 9.9* 8.7* 8.3* 8.6*  HCT 30.0* 30.2* 26.2* 24.9* 26.0*  MCV 96.5 93.5 92.3 92.6 93.5  PLT 253 280 247 246 246   Basic Metabolic Panel: Recent Labs  Lab 01/05/24 1018 01/06/24 0417 01/07/24 0454 01/07/24 2355 01/08/24 0653  NA 136 134* 133* 131* 134*  K 4.2 3.9 3.8 4.3 4.1  CL 100 97* 97* 96* 96*  CO2 26 26 25 22 25   GLUCOSE 306* 194* 210* 320* 180*  BUN 48* 29* 52* 71* 74*  CREATININE 5.29* 4.02* 5.89* 7.18* 7.49*  CALCIUM  8.9 8.2* 7.9* 8.0* 8.1*  MG  --  1.7  --   --   --   PHOS  --  3.1 5.0*  --  5.8*  GFR: Estimated Creatinine Clearance: 13 mL/min (A) (by C-G formula based on SCr of 7.49 mg/dL (H)). Liver Function Tests: Recent Labs  Lab 01/06/24 0417 01/07/24 0454 01/08/24 0653  ALBUMIN  2.9* 2.6* 2.6*   No results for input(s): LIPASE, AMYLASE in the last 168 hours. No results for input(s): AMMONIA in the last 168 hours. Coagulation Profile: Recent Labs  Lab 01/07/24 0918  INR 1.1   Cardiac Enzymes: No results for input(s): CKTOTAL, CKMB, CKMBINDEX, TROPONINI in the last 168  hours. BNP (last 3 results) No results for input(s): PROBNP in the last 8760 hours. HbA1C: Recent Labs    01/05/24 1210  HGBA1C 9.9*   CBG: Recent Labs  Lab 01/07/24 0741 01/07/24 1140 01/07/24 1625 01/07/24 2238 01/08/24 0500  GLUCAP 180* 303* 354* 341* 201*   Lipid Profile: Recent Labs    01/06/24 0417  CHOL 88  HDL 34*  LDLCALC 33  TRIG 892  CHOLHDL 2.6   Thyroid Function Tests: Recent Labs    01/05/24 1210  TSH 2.190   Anemia Panel: Recent Labs    01/05/24 1210 01/05/24 1549  VITAMINB12 819  --   FOLATE  --  11.1  FERRITIN 1,105*  --   TIBC 298  --   IRON 36*  --   RETICCTPCT 1.7  --    Sepsis Labs: No results for input(s): PROCALCITON, LATICACIDVEN in the last 168 hours.  Recent Results (from the past 240 hours)  Surgical pcr screen     Status: None   Collection Time: 01/06/24  5:49 PM   Specimen: Nasal Mucosa; Nasal Swab  Result Value Ref Range Status   MRSA, PCR NEGATIVE NEGATIVE Final   Staphylococcus aureus NEGATIVE NEGATIVE Final    Comment: (NOTE) The Xpert SA Assay (FDA approved for NASAL specimens in patients 54 years of age and older), is one component of a comprehensive surveillance program. It is not intended to diagnose infection nor to guide or monitor treatment. Performed at [facility], [address], [place], [place] [address]          Radiology Studies: VAS US  DOPPLER PRE CABG Result Date: 01/07/2024 PREOPERATIVE VASCULAR EVALUATION Patient Name:  Timothy Holloway  Date of Exam:   01/07/2024 Medical Rec #: [id number]            Accession #:    [id number] Date of Birth: Mar 20, 1955             Patient Gender: M Patient Age:   65 years Exam Location:  [facility] Procedure:      VAS US  DOPPLER PRE CABG Referring Phys: HARRELL LIGHTFOOT --------------------------------------------------------------------------------  Indications:            Pre-CABG. Risk Factors:           Hypertension, hyperlipidemia,  Diabetes, coronary artery                         disease, PAD. Other Factors:          Obesity, on HD. Vascular Interventions: 07/26/22- Right upper arm AVF creation.                         01/11/22 - Left arm AVF creation. Performing Technologist: Ricka Sturdivant-Jones RDMS, RVT  Examination Guidelines: A complete evaluation includes B-mode imaging, spectral Doppler, color Doppler, and power Doppler as needed of all accessible portions of each vessel. Bilateral testing is considered an integral  part of a complete examination. Limited examinations for reoccurring indications may be performed as noted.  Right Carotid Findings: +----------+--------+--------+--------+--------+--------+           PSV cm/sEDV cm/sStenosisDescribeComments +----------+--------+--------+--------+--------+--------+ CCA Prox  60      20                               +----------+--------+--------+--------+--------+--------+ CCA Distal86      27                               +----------+--------+--------+--------+--------+--------+ ICA Prox  153     46      40-59%  calcific         +----------+--------+--------+--------+--------+--------+ ICA Mid   169     46      40-59%  calcific         +----------+--------+--------+--------+--------+--------+ ICA Distal100     29                               +----------+--------+--------+--------+--------+--------+ ECA       93      18                               +----------+--------+--------+--------+--------+--------+ +----------+--------+-------+----------------+------------+           PSV cm/sEDV cmsDescribe        Arm Pressure +----------+--------+-------+----------------+------------+ [id number]            Multiphasic, [id number]          +----------+--------+-------+----------------+------------+ +---------+--------+--+--------+--+---------+ VertebralPSV cm/s52EDV cm/s19Antegrade +---------+--------+--+--------+--+---------+ Left  Carotid Findings: +----------+--------+--------+--------+----------------------+---------+           PSV cm/sEDV cm/sStenosisDescribe              Comments  +----------+--------+--------+--------+----------------------+---------+ CCA Prox  61      0                                               +----------+--------+--------+--------+----------------------+---------+ CCA Distal31      8                                               +----------+--------+--------+--------+----------------------+---------+ ICA Prox  358     126     80-99%  irregular and calcificShadowing +----------+--------+--------+--------+----------------------+---------+ ICA Mid   91      26                                              +----------+--------+--------+--------+----------------------+---------+ ICA Distal75      10                                              +----------+--------+--------+--------+----------------------+---------+ ECA       381     52      >50%                                    +----------+--------+--------+--------+----------------------+---------+ +----------+--------+--------+--------+------------+  SubclavianPSV cm/sEDV cm/sDescribeArm Pressure +----------+--------+--------+--------+------------+           97                                   +----------+--------+--------+--------+------------+ +---------+--------+--+--------+--+ VertebralPSV cm/s85EDV cm/s23 +---------+--------+--+--------+--+  ABI Findings: +------------------+-----+---------+----------------+ Rt Pressure (mmHg)IndexWaveform Comment          +------------------+-----+---------+----------------+ 176                    triphasic                 +------------------+-----+---------+----------------+                        biphasic non compressible +------------------+-----+---------+----------------+                        biphasic non compressible  +------------------+-----+---------+----------------+ 84                0.48 Abnormal                  +------------------+-----+---------+----------------+ +------------------+-----+---------+----------------+ Lt Pressure (mmHg)IndexWaveform Comment          +------------------+-----+---------+----------------+                        triphasicNon compressible +------------------+-----+---------+----------------+                                 non compressible +------------------+-----+---------+----------------+                                 non compressible +------------------+-----+---------+----------------+ 90                0.51 Abnormal                  +------------------+-----+---------+----------------+ +-------+---------------+ ABI/TBIToday's ABI/TBI +-------+---------------+ Right  [place] / 0.48       +-------+---------------+ Left   [place] / 0.51       +-------+---------------+  Right Doppler Findings: +--------+--------+---------+ Site    PressureDoppler   +--------+--------+---------+ [id number]     triphasic +--------+--------+---------+ Radial          triphasic +--------+--------+---------+ Ulnar           triphasic +--------+--------+---------+  Left Doppler Findings: +--------+---------+----------------+ Site    Doppler  Comments         +--------+---------+----------------+ BrachialtriphasicNon compressible +--------+---------+----------------+ Radial  triphasic                 +--------+---------+----------------+ Ulnar   triphasic                 +--------+---------+----------------+   Summary: Right Carotid: Velocities in the right ICA are consistent with a 40-59%                stenosis. Left Carotid: Velocities in the left ICA are consistent with a 80-99% stenosis. Vertebrals:  Bilateral vertebral arteries demonstrate antegrade flow. Subclavians: Normal flow hemodynamics were seen in bilateral subclavian               arteries. Right ABI: Resting right ankle-brachial index indicates noncompressible right lower extremity arteries. The right toe-brachial index is abnormal. Left ABI: Resting left ankle-brachial index indicates noncompressible left lower extremity arteries. The left toe-brachial index is  abnormal. Right Upper Extremity: Doppler waveform obliterate with right radial compression. Doppler waveforms decrease 50% with right ulnar compression. Left Upper Extremity: Doppler waveform obliterate with left radial compression. Doppler waveforms decrease 50% with left ulnar compression.  Electronically signed by Debby Robertson on 01/07/2024 at 8:40:42 PM.    Final    DG Chest 2 View Result Date: 01/07/2024 EXAM: 2 VIEW(S) XRAY OF THE CHEST 01/07/2024 08:33:00 AM COMPARISON: 01/05/2024 CLINICAL HISTORY: CABG (coronary artery bypass graft) planned. Coronary artery disease. FINDINGS: LINES, TUBES AND DEVICES: Tunneled right IJ hemodialysis catheter in stable position, tip projects over the SVC. LUNGS AND PLEURA: Upper zone pulmonary vascular prominence favoring pulmonary venous hypertension. No focal pulmonary opacity. No pleural effusion. No pneumothorax. HEART AND MEDIASTINUM: Stable mild cardiomegaly. Aortic atherosclerosis. BONES AND SOFT TISSUES: Multilevel thoracic osteophytosis. No acute osseous abnormality. IMPRESSION: 1. Stable mild cardiomegaly and aortic atherosclerosis. 2. Upper zone pulmonary vascular prominence favoring pulmonary venous hypertension. 3. Several chronic and incidental findings are present, including a tunneled right IJ hemodialysis catheter in stable position and multilevel thoracic osteophytosis. Electronically signed by: Ryan Salvage MD 01/07/2024 12:43 PM EST RP Workstation: [id number]        Scheduled Meds:  aspirin  EC  81 mg Oral Daily   atorvastatin   80 mg Oral Daily    ceFAZolin  (ANCEF ) IV  3 g Intravenous To OR   chlorhexidine   15 mL Mouth/Throat Once   Chlorhexidine  Gluconate  Cloth  6 each Topical Q0600   cloNIDine   0.1 mg Oral BID   epinephrine   0-10 mcg/min Intravenous To OR   fenofibrate   54 mg Oral Daily   heparin  sodium (porcine) 2,500 Units, papaverine  30 mg in electrolyte-A (PLASMALYTE-A PH 7.4) 500 mL irrigation   Irrigation To OR   hydrALAZINE   100 mg Oral BID   insulin  aspart  0-6 Units Subcutaneous TID WC   insulin  glargine  40 Units Subcutaneous QHS   insulin    Intravenous To OR   isosorbide  mononitrate  30 mg Oral QHS   [place] Blood Cardioplegia vial (lidocaine /magnesium /mannitol  0.26g-4g-6.4g)   Intracoronary Once   metoprolol  tartrate  12.5 mg Oral Once   phenylephrine   30-200 mcg/min Intravenous To OR   potassium chloride   80 mEq Other To OR   sodium chloride  flush  3 mL Intravenous Q12H   sucroferric oxyhydroxide  500 mg Oral TID WC   tranexamic acid   15 mg/kg Intravenous To OR   tranexamic acid   2 mg/kg Intracatheter To OR   Continuous Infusions:  anticoagulant sodium citrate       ceFAZolin  (ANCEF ) IV     dexmedetomidine      heparin  30,000 units/NS 1000 mL solution for CELLSAVER     heparin  2,400 Units/hr (01/08/24 0430)   milrinone      nitroGLYCERIN      norepinephrine      tranexamic acid  (CYKLOKAPRON ) 2,500 mg in sodium chloride  0.9 % 250 mL (10 mg/mL) infusion     vancomycin        LOS: 3 days       Renato Applebaum, MD [facility]

## 2024-01-08 NOTE — Progress Notes (Addendum)
     [address] [place] [address]             [phone]        No events     Vitals:    01/08/24 0442 01/08/24 0638  BP: 111/80 118/88  Pulse: 67 67  Resp: 20 20  Temp: 97.7 F (36.5 C) 98.1 F (36.7 C)  SpO2: 99% 99%    Alert NAD Sinus EWOB   OR for CABG

## 2024-01-08 NOTE — Consult Note (Signed)
 NAME:  Timothy Holloway, MRN:  [id number], DOB:  01-16-1956, LOS: 3 ADMISSION DATE:  01/05/2024, CONSULTATION DATE:  01/08/24 REFERRING MD:  Raenelle POUR., CHIEF COMPLAINT:  s/p CABG   History of Present Illness:  Timothy Holloway is a 68 year old male with past medical history significant for ESRD on HD, T2DM, HTN, obesity, chronic anemia who presented to [facility] ED 01/05/24 after experiencing chest pain/pressure during dialysis. In the ED, troponins were elevated, he was started on a heparin  gtt and transferred to [facility] for a cardiac cath where he was found to have triple-vessel disease and CHF. TCTS to take patient for CABG 12/8. PCCM consulted for post-operative medical management.  OR course EBL: 1100 Cell saver 482 Products 0 Bypass time: 1hr 58 min  Clamp time 1hr 21 min   Pertinent Medical History:   Past Medical History:  Diagnosis Date   Anemia    Cataract    Chronic kidney disease (CKD), stage III (moderate) (HCC)    dr Faythe   Depression    Diabetes mellitus without complication (HCC)    DVT (deep venous thrombosis) (HCC)    as a child   ESRD on dialysis (HCC)    M-W-F   Hyperlipidemia    Hypertension    Impotence    Insomnia    Obesity    Steal syndrome as complication of dialysis access    Significant Hospital Events: Including procedures, antibiotic start and stop dates in addition to other pertinent events   12/5: Transfer from [facility]->cath showed triple-vessel disease/CHF 12/8: iHD in am Pre-op   CABG w/ Dr. Shyrl  Interim History / Subjective:  PCCM consulted for post-operative medical management  Objective    Blood pressure (!) 152/49, pulse (!) 55, temperature 97.8 F (36.6 C), temperature source Oral, resp. rate 18, height 6' (1.829 m), weight 133.8 kg, SpO2 95%.        Intake/Output Summary (Last 24 hours) at 01/08/2024 1400 Last data filed at 01/08/2024 1344 Gross per 24 hour  Intake 1082.86 ml  Output 5650 ml  Net -4567.14 ml   Filed Weights    01/08/24 0730 01/08/24 1125 01/08/24 1208  Weight: 133.2 kg 130.2 kg 133.8 kg   Examination: General: obese 68 year old white male sedated on dex  HENT: orally intubated PERRL, there is left internal jugular CVL and right tunneled HD Lungs: scattered rhonchi. Spont ventilating 8 breaths over set rate CT w/ 20 ml  Cardiovascular: regular V paced at 80 bpm sternal dressing intact  Abdomen: soft  Extremities: warm RLE wrapped  Neuro: sedated on dex. Moves all ext  GU: UOP w/ CNC yellow uop via fc   Resolved Problem List:   Assessment and Plan:   S/p NSTEMI c/b acute systolic HF (pre-op  EF 30-35% intra-op reported as NML) w/  s/p CABG x3 w/ R SVG plan F/u post operative ABG, CXR, coags,CBC, chemistry and 12-lead Passive rewarming and initiate precedex  wean once normothermic and hemodynamically stable Continue post operative telemetry monitoring for conduction disturbances Ensure temporary epicardial pacing wires in place with back up rate for symptomatic bradycardia or AVB MAP goal 65-80; SBP <120 using  post-operative orderset w/ PRN pressors, inoptropes and antihypertensive management as indicated.  Ensure euvolemic Close observation for evidence of bleeding, including chest tube output and surgical site.Monitor Chest tube output if  >400cc in 1 hr, >300 for 2 consecutive hours, >200cc for three consecutive hours notify surgical team Tight glycemic control Multi-modal analgesia  SCDs immediate post-op; w/  DVT prophylaxis vs systemic AC to be initiated at surgical team discretion  Complete prophylactic antibiotics    Post op Vent management  Plan F/u pcxr and abg Initiate wean protocol once hemodynamics stable and normothermic VAP bundle  Expected post-operative ABLA Expected post-operative consumptive thrombocytopenia  Preop hgb 8.6 now 8.4 Plan trend  transfuse for hgb <8 or significant bleeding  Low threshold fort DDAVP given ESRD correct coagulopathy with significant  bleeding    ESRD on HD plan HD MWF via [facility]  T2DM plan Insulin  gtt post-op Trend CBG SSI when gtt is stopped Goal BG 100-180  Labs:  CBC: Recent Labs  Lab 01/05/24 1018 01/06/24 0417 01/07/24 0454 01/07/24 2355 01/08/24 0653 01/08/24 1223 01/08/24 1340 01/08/24 1344  WBC 12.2* 12.6* 11.1* 10.4 11.4*  --   --   --   HGB 9.6* 9.9* 8.7* 8.3* 8.6* 9.9* 8.8* 9.2*  HCT 30.0* 30.2* 26.2* 24.9* 26.0* 29.0* 26.0* 27.0*  MCV 96.5 93.5 92.3 92.6 93.5  --   --   --   PLT 253 280 247 246 246  --   --   --     Basic Metabolic Panel: Recent Labs  Lab 01/05/24 1018 01/06/24 0417 01/07/24 0454 01/07/24 2355 01/08/24 0653 01/08/24 1223 01/08/24 1340 01/08/24 1344  NA 136 134* 133* 131* 134* 134* 133* 134*  K 4.2 3.9 3.8 4.3 4.1 3.6 3.8 3.8  CL 100 97* 97* 96* 96* 96* 96*  --   CO2 26 26 25 22 25   --   --   --   GLUCOSE 306* 194* 210* 320* 180* 117* 163*  --   BUN 48* 29* 52* 71* 74* 38* 35*  --   CREATININE 5.29* 4.02* 5.89* 7.18* 7.49* 4.30* 4.50*  --   CALCIUM  8.9 8.2* 7.9* 8.0* 8.1*  --   --   --   MG  --  1.7  --   --   --   --   --   --   PHOS  --  3.1 5.0*  --  5.8*  --   --   --    GFR: Estimated Creatinine Clearance: 21.6 mL/min (A) (by C-G formula based on SCr of 4.5 mg/dL (H)). Recent Labs  Lab 01/06/24 0417 01/07/24 0454 01/07/24 2355 01/08/24 0653  WBC 12.6* 11.1* 10.4 11.4*    Liver Function Tests: Recent Labs  Lab 01/06/24 0417 01/07/24 0454 01/08/24 0653  ALBUMIN  2.9* 2.6* 2.6*   No results for input(s): LIPASE, AMYLASE in the last 168 hours. No results for input(s): AMMONIA in the last 168 hours.  ABG    Component Value Date/Time   PHART 7.416 01/08/2024 1344   PCO2ART 40.1 01/08/2024 1344   PO2ART 197 (H) 01/08/2024 1344   HCO3 25.8 01/08/2024 1344   TCO2 27 01/08/2024 1344   O2SAT 100 01/08/2024 1344     Coagulation Profile: Recent Labs  Lab 01/07/24 0918  INR 1.1    Cardiac Enzymes: No results for input(s):  CKTOTAL, CKMB, CKMBINDEX, TROPONINI in the last 168 hours.  HbA1C: Hgb A1c MFr Bld  Date/Time Value Ref Range Status  01/05/2024 12:10 PM 9.9 (H) 4.8 - 5.6 % Final    Comment:    (NOTE) Diagnosis of Diabetes The following HbA1c ranges recommended by the American Diabetes Association (ADA) may be used as an aid in the diagnosis of diabetes mellitus.  Hemoglobin             Suggested A1C NGSP%  Diagnosis  <5.7                   Non Diabetic  5.7-6.4                Pre-Diabetic  >6.4                   Diabetic  <7.0                   Glycemic control for                       adults with diabetes.    07/22/2021 03:39 AM 8.0 (H) 4.8 - 5.6 % Final    Comment:    (NOTE) Pre diabetes:          5.7%-6.4%  Diabetes:              >6.4%  Glycemic control for   <7.0% adults with diabetes     CBG: Recent Labs  Lab 01/07/24 1140 01/07/24 1625 01/07/24 2238 01/08/24 0500 01/08/24 1217  GLUCAP 303* 354* 341* 201* 118*    Review of Systems:   Not able   Past Medical History:  He,  has a past medical history of Anemia, Cataract, Chronic kidney disease (CKD), stage III (moderate) (HCC), Depression, Diabetes mellitus without complication (HCC), DVT (deep venous thrombosis) (HCC), ESRD on dialysis (HCC), Hyperlipidemia, Hypertension, Impotence, Insomnia, Obesity, and Steal syndrome as complication of dialysis access.   Surgical History:   Past Surgical History:  Procedure Laterality Date   A/V FISTULAGRAM Right 10/11/2022   Procedure: A/V Fistulagram;  Surgeon: Serene Gaile ORN, MD;  Location: [facility] INVASIVE CV LAB;  Service: Cardiovascular;  Laterality: Right;   AV FISTULA PLACEMENT Left 01/11/2022   Procedure: LEFT ARM ARTERIOVENOUS (AV) FISTULA CREATION;  Surgeon: Lanis Fonda BRAVO, MD;  Location: [facility] OR;  Service: Vascular;  Laterality: Left;  PERIPHERAL NERVE BLOCK   AV FISTULA PLACEMENT Right 07/26/2022   Procedure: RIGHT UPPER EXTREMITY ARTERIOVENOUS  (AV) FISTULA CREATION;  Surgeon: Oris Krystal FALCON, MD;  Location: [facility] ORS;  Service: Vascular;  Laterality: Right;   DIALYSIS/PERMA CATHETER INSERTION Right 10/23/2023   Procedure: DIALYSIS/PERMA CATHETER INSERTION;  Surgeon: Magda Debby SAILOR, MD;  Location: [facility] PV LAB;  Service: Cardiovascular;  Laterality: Right;   INSERTION OF DIALYSIS CATHETER Right 06/16/2022   Procedure: INSERTION OF RIGHT iNTERNAL Jugular TUNNELED DIALYSIS CATHETER;  Surgeon: Serene Gaile ORN, MD;  Location: [facility] OR;  Service: Vascular;  Laterality: Right;   KNEE SURGERY Left 01-31-2002   LEFT HEART CATH AND CORONARY ANGIOGRAPHY N/A 01/05/2024   Procedure: LEFT HEART CATH AND CORONARY ANGIOGRAPHY;  Surgeon: Jordan, Lashica Hannay M, MD;  Location: [facility] INVASIVE CV LAB;  Service: Cardiovascular;  Laterality: N/A;   LIGATION OF ARTERIOVENOUS  FISTULA Left 06/16/2022   Procedure: LEFT ARM FISTULA LIGATION;  Surgeon: Serene Gaile ORN, MD;  Location: [facility] OR;  Service: Vascular;  Laterality: Left;   LIGATION OF ARTERIOVENOUS  FISTULA Right 11/08/2022   Procedure: LIGATION of RIGHT RADIOCEPHALIC FISTULA;  Surgeon: Sheree Penne Bruckner, MD;  Location: [facility] OR;  Service: Vascular;  Laterality: Right;   SPINE SURGERY     fusion of L4-L5   UPPER EXTREMITY ANGIOGRAPHY Right 11/03/2022   Procedure: Upper Extremity Angiography;  Surgeon: Gretta Bruckner PARAS, MD;  Location: [facility] INVASIVE CV LAB;  Service: Cardiovascular;  Laterality: Right;     Social History:   reports that he has never smoked. He has  never been exposed to tobacco smoke. He does not have any smokeless tobacco history on file. He reports that he does not drink alcohol and does not use drugs.   Family History:  His family history includes Cancer in his father; Diabetes in his mother.   Allergies Allergies  Allergen Reactions   Latex Hives   Lodine [Etodolac] Nausea And Vomiting     Home Medications  Prior to Admission medications   Medication Sig Start Date End Date Taking?  Authorizing Provider  acetaminophen  (TYLENOL ) 325 MG tablet Take 650 mg by mouth every 6 (six) hours as needed for moderate pain. 06/06/20  Yes [provider]  albuterol  (VENTOLIN  HFA) 108 (90 Base) MCG/ACT inhaler Inhale 2 puffs into the lungs every 6 (six) hours as needed for wheezing. 09/14/23  Yes [provider]  aspirin  81 MG tablet Take 81 mg by mouth daily.   Yes [provider]  atorvastatin  (LIPITOR) 10 MG tablet Take 1 tablet (10 mg total) by mouth daily. Patient taking differently: Take 10 mg by mouth at bedtime. 08/06/12  Yes Cyrena Gwenn SQUIBB, MD  carvedilol  (COREG ) 6.25 MG tablet Take 6.25 mg by mouth 2 (two) times daily with a meal. 09/14/23  Yes [provider]  Cholecalciferol (VITAMIN D) 2000 UNITS CAPS Take 2,000 Units by mouth at bedtime.   Yes [provider]  cloNIDine  (CATAPRES ) 0.1 MG tablet Take 0.1 mg by mouth 2 (two) times daily. 05/16/22  Yes [provider]  diclofenac Sodium (VOLTAREN) 1 % GEL Apply 2 g topically daily as needed (pain).   Yes [provider]  EPINEPHrine  0.3 mg/0.3 mL IJ SOAJ injection Inject 0.3 mg into the muscle as needed for anaphylaxis. 09/14/23  Yes [provider]  fenofibrate  (TRICOR ) 145 MG tablet Take 1/2 tablet daily Patient taking differently: Take 72.5 mg by mouth every morning. 09/06/13  Yes Georgina Nancyann ORN, MD  furosemide  (LASIX ) 80 MG tablet Take 80 mg by mouth daily. 12/28/21  Yes [provider]  Glucosamine-Chondroit-Vit C-Mn (GLUCOSAMINE CHONDR 1500 COMPLX) CAPS Take 1 capsule by mouth 2 (two) times daily.   Yes [provider]  HUMALOG KWIKPEN 200 UNIT/ML KwikPen Inject 10-20 Units into the skin with breakfast, with lunch, and with evening meal. Sliding scale 07/19/21  Yes [provider]  hydrALAZINE  (APRESOLINE ) 100 MG tablet Take 100 mg by mouth 2 (two) times daily. 10/13/23  Yes [provider]  hydrochlorothiazide  (HYDRODIURIL ) 25 MG  tablet Take 25 mg by mouth daily. 12/29/21  Yes [provider]  isosorbide  mononitrate (IMDUR ) 30 MG 24 hr tablet Take 30 mg by mouth at bedtime.   Yes [provider]  LANTUS  SOLOSTAR 100 UNIT/ML Solostar Pen Inject 50 Units into the skin at bedtime. 10/25/21  Yes [provider]  losartan  (COZAAR ) 100 MG tablet Take 100 mg by mouth at bedtime. 10/25/21  Yes [provider]  meclizine (ANTIVERT) 25 MG tablet Take 25 mg by mouth daily as needed for dizziness. 04/24/15  Yes [provider]  methocarbamol (ROBAXIN) 500 MG tablet Take 1 tablet by mouth daily as needed for muscle spasms. 10/14/19  Yes [provider]  Multiple Vitamin (MULTIVITAMIN) tablet Take 1 tablet by mouth daily.   Yes [provider]  sertraline (ZOLOFT) 50 MG tablet Take 50 mg by mouth daily as needed (mood). 12/30/21  Yes [provider]  sodium chloride  (OCEAN) 0.65 % SOLN nasal spray Place 1 spray into both nostrils daily as needed  for congestion.   Yes [provider]  sucroferric oxyhydroxide (VELPHORO ) 500 MG chewable tablet Chew 500 mg by mouth 3 (three) times daily with meals. 05/26/22  Yes [provider]  valACYclovir (VALTREX) 1000 MG tablet Take 1,000 mg by mouth 2 (two) times daily. 01/10/17  Yes [provider]  zolpidem  (AMBIEN ) 10 MG tablet Take 1 tablet (10 mg total) by mouth at bedtime as needed for sleep. 01/11/13  Yes Cyrena Gwenn SQUIBB, MD    Critical care time:  I personally  spent 32 minutes  on this patient which included: review of medical records, nursing notes, progress notes, evaluation, interpretation of lab data and diagnostic studies, taking independent history, performing exam, documenting plan, ordering diagnostics and interventions for the following critical care issues: Circulatory shock, Acute respiratory failure with the following interventions which included: titration of ventilatory support, titration of  hemodynamic drips to desired MAP

## 2024-01-08 NOTE — Anesthesia Preprocedure Evaluation (Signed)
 Anesthesia Evaluation  Patient identified by MRN, date of birth, ID band Patient awake    Reviewed: Allergy & Precautions, NPO status , Patient's Chart, lab work & pertinent test results  History of Anesthesia Complications Negative for: history of anesthetic complications  Airway Mallampati: Unable to assess  TM Distance: >3 FB Neck ROM: Full    Dental  (+) Dental Advisory Given   Pulmonary shortness of breath, neg COPD, neg recent URI   breath sounds clear to auscultation       Cardiovascular hypertension, + CAD and + Past MI   Rhythm:Regular   1. Extremely limited; there appears to be hypokinesis of the distal  anteroseptal wall and apex with overall mild to moderate LV dysfunction.   2. Left ventricular ejection fraction, by estimation, is 40 to 45%. The  left ventricle has mildly decreased function. The left ventricle  demonstrates regional wall motion abnormalities (see scoring  diagram/findings for description). The left ventricular   internal cavity size was mildly dilated. There is moderate left  ventricular hypertrophy. Left ventricular diastolic parameters are  indeterminate.   3. Right ventricular systolic function is normal. The right ventricular  size is normal.   4. The mitral valve is normal in structure. Trivial mitral valve  regurgitation. No evidence of mitral stenosis. Severe mitral annular  calcification.   5. The aortic valve is tricuspid. Aortic valve regurgitation is not  visualized. Aortic valve sclerosis/calcification is present, without any  evidence of aortic stenosis.   6. The inferior vena cava is dilated in size with >50% respiratory  variability, suggesting right atrial pressure of 8 mmHg.      Ost LAD to Prox LAD lesion is 95% stenosed.   Mid Cx lesion is 50% stenosed.   Prox Cx lesion is 90% stenosed.   Prox RCA lesion is 70% stenosed.   Dist Cx lesion is 50% stenosed.   There is  moderate to severe left ventricular systolic dysfunction.   LV end diastolic pressure is mildly elevated.   1. Severe 3 vessel obstructive CAD 2. Mod- severe LV dysfunction. EF estimated at 30-35% 3. Mildly elevated LVEDP 19 mm Hg   Plan: check Echo. CT surgery consult.     Neuro/Psych    GI/Hepatic negative GI ROS, Neg liver ROS,,,  Endo/Other  diabetes    Renal/GU ESRF and DialysisRenal diseaseLab Results      Component                Value               Date                      NA                       134 (L)             01/08/2024                K                        3.6                 01/08/2024                CO2                      25  01/08/2024                GLUCOSE                  145 (H)             01/08/2024                BUN                      35 (H)              01/08/2024                CREATININE               4.50 (H)            01/08/2024                CALCIUM                   8.1 (L)             01/08/2024                GFRNONAA                 7 (L)               01/08/2024                Musculoskeletal   Abdominal   Peds  Hematology  (+) Blood dyscrasia, anemia Lab Results      Component                Value               Date                      WBC                      11.4 (H)            01/08/2024                HGB                      8.2 (L)             01/08/2024                HCT                      24.0 (L)            01/08/2024                MCV                      93.5                01/08/2024                PLT                      246                 01/08/2024              Anesthesia Other Findings   Reproductive/Obstetrics  Anesthesia Physical Anesthesia Plan  ASA: 4  Anesthesia Plan: General   Post-op Pain Management:    Induction: Intravenous  PONV Risk Score and Plan: 3 and Ondansetron   Airway Management Planned: Oral  ETT  Additional Equipment: Arterial line, CVP, TEE and Ultrasound Guidance Line Placement  Intra-op Plan:   Post-operative Plan: Post-operative intubation/ventilation  Informed Consent: I have reviewed the patients History and Physical, chart, labs and discussed the procedure including the risks, benefits and alternatives for the proposed anesthesia with the patient or authorized representative who has indicated his/her understanding and acceptance.     Dental advisory given  Plan Discussed with: CRNA  Anesthesia Plan Comments:          Anesthesia Quick Evaluation

## 2024-01-08 NOTE — Transfer of Care (Signed)
 Immediate Anesthesia Transfer of Care Note  Patient: Timothy Holloway  Procedure(s) Performed: CORONARY ARTERY BYPASS GRAFTING (CABG) TIMES 3 USING LEFT INTERNAL MAMMARY ARTERY AND ENDOSCOPICALLY HARVESTED RIGHT GREATER SAPHENOUS VEIN (Chest) ECHOCARDIOGRAM, TRANSESOPHAGEAL  Patient Location: SICU  Anesthesia Type:General  Level of Consciousness: drowsy and responds to stimulation  Airway & Oxygen Therapy: Patient remains intubated per anesthesia plan and Patient placed on Ventilator (see vital sign flow sheet for setting)  Post-op Assessment: Report given to RN, Post -op Vital signs reviewed and stable, and Patient moving all extremities X 4  Post vital signs: Reviewed and stable  Last Vitals:  Vitals Value Taken Time  BP    Temp    Pulse 128 01/08/24 18:48  Resp 21 01/08/24 18:48  SpO2 89 % 01/08/24 18:48  Vitals shown include unfiled device data.  Last Pain:  Vitals:   01/08/24 1217  TempSrc:   PainSc: 0-No pain         Complications: No notable events documented.

## 2024-01-08 NOTE — Progress Notes (Signed)
  Echocardiogram Echocardiogram Transesophageal has been performed.  Devora Ellouise SAUNDERS 01/08/2024, 3:13 PM

## 2024-01-08 NOTE — Hospital Course (Addendum)
 History of Present Illness:  Timothy Holloway is a 68 year old gentleman with past history of end-stage renal disease on hemodialysis for the past year and a half, type 2 diabetes mellitus, dyslipidemia, and obesity.  He developed chest pain and shortness of breath shortly after arriving for his dialysis treatment earlier today.  He was taken to the emergency room at [facility] by way of EMS for further evaluation.  He described having similar episodes several times over the past few days, each lasting about an hour and resolving without any particular intervention.  In the emergency room, his EKG showed normal sinus rhythm with first-degree AV block.  Chest x-ray was notable for cardiomegaly with interstitial opacities consistent with pulmonary edema.  Initial high-sensitivity troponin was 1100 and later rose to 1300.  Having ruled in for non-ST elevation myocardial infarction, he was started on aspirin , atorvastatin , and a heparin  infusion.  His chest pain resolved. He was transferred to [place] [facility] Course:  Cardiology consultation was provided by Dr. Deward Gull.  She recommended proceeding with left heart catheterization for further evaluation.  The study was conducted by Dr. Peter Jordan earlier today and shows severe three-vessel coronary artery disease with high-grade proximal stenoses in the LAD, circumflex, and RCA.  LVEDP was evaded at 19 and left ventricular ejection fraction was estimated at 30 to 35%. CT surgery has been asked to evaluate Timothy Holloway for consideration of coronary bypass grafting.  The patient was evaluated by Dr. Shyrl who was in agreement coronary bypass grafting would be indicated. The risks and benefits of the procedure were explained to the patient and he was agreeable to proceed.  He was taken to the Operating room on 01/08/2024 and underwent Coronary Bypass Grafting x 3 utilizing LIMA to LAD, SVG to OM, and SVG to Distal RCA.  He  tolerated the procedure without difficulty and was taken to the SICU in stable condition.  The patient was extubated the evening of surgery.  The patient required support with levophed  which was weaned as tolerated.  He developed difficulty with words and Neurology consultation was obtained.  Non contrast CT of the head was unremarkable.  CTA showed left ICA stenosis.  Initially felt his symptoms could likely be TIA vs stroke vs toxic metabolic encephalopathy.  However he developed acute facial droop and decreased movement of his right side.  He was noted to be hypotensive during the event despite Levophed .  It was recommended he have a higher MAP to prevent further recurrence of symptoms.  EEG was also obtained and showed generalized cerebral dysfunction.  He was started on Midodrine  to help with hypotension.  Nephrology followed patient and recommended using CRRT in place of HD to avoid drops in blood pressure.  Vascular surgery was consulted and they felt his neurologic symptoms were likely due to his carotid disease.  They agreed he would benefit from MRI after pacing wire removal for further workup.  They feel TCAR would be indicated.  Patient continued to have neurologic symptoms with progression into right sided weakness.  Due to this the patient underwent urgent TCAR on 01/10/2024.  He was maintaining NSR and his pacing wires were removed without difficulty.  He required transfusion due to worsening hemoglobin/hematocrit. CRRT was transitioned to intermittent HD. He began tolerating full liquids, tube feeds were decreased.

## 2024-01-08 NOTE — Progress Notes (Signed)
 Heart Failure Navigator Progress Note  Assessed for Heart & Vascular TOC clinic readiness.  Patient does not meet criteria due to ESRD on hemodialysis.No HF TOC. .   Navigator will sign off at this time.,  Stephane Haddock, BSN, Scientist, Clinical (histocompatibility And Immunogenetics) Only

## 2024-01-08 NOTE — Op Note (Signed)
 [address] [place] [address]             [phone]                                     01/08/2024 Patient:  Timothy Holloway Pre-Op  Dx: NSTEMI 3C CAD   ESRD CHF DM Mrobid obesity Post-op Dx:  same Procedure: CABG X 3.  LIMA LAD,  RSVG OM, dRCA Endoscopic greater saphenous vein harvest on the right   Surgeon and Role:      * Alianis Trimmer, Linnie KIDD, MD - Primary    * E. Barrett , PA-C - assisting An experienced assistant was required given the complexity of this surgery and the standard of surgical care. The assistant was needed for exposure, dissection, suctioning, retraction of delicate tissues and sutures, instrument exchange and for overall help during this procedure.    Anesthesia  general EBL:  500ml Blood Administration: see anesthesia records Xclamp Time:  79 min Pump Time:   Drains: 35 F blake drain:  R, L, mediastinal  Wires: V Counts: correct   Indications: 68 yo male with 3V CAD, ESRD, and CHF. No good PCI options. Will plan for CABG  Findings: Calcified vein and LIMA.  Intramyocardial LAD.  Good dRCA.  Intramyocardial OM  Operative Technique: All invasive lines were placed in pre-op  holding.  After the risks, benefits and alternatives were thoroughly discussed, the patient was brought to the operative theatre.  Anesthesia was induced, and the patient was prepped and draped in normal sterile fashion.  An appropriate surgical pause was performed, and pre-operative antibiotics were dosed accordingly.  We began with simultaneous incisions along the right leg for harvesting of the greater saphenous vein and the chest for the sternotomy.  In regards to the sternotomy, this was carried down with bovie cautery, and the sternum was divided with a reciprocating saw.  Meticulous hemostasis was obtained.  The left internal thoracic artery was exposed and harvested in in pedicled fashion.  The patient was systemically heparinized,  and the artery was divided distally, and placed in a papaverine  sponge.    The sternal elevator was removed, and a retractor was placed.  The pericardium was divided in the midline and fashioned into a cradle with pericardial stitches.   After we confirmed an appropriate ACT, the ascending aorta was cannulated in standard fashion.  The right atrial appendage was used for venous cannulation site.  Cardiopulmonary bypass was initiated, and the heart retractor was placed. The cross clamp was applied, and a dose of anterograde cardioplegia was given with good arrest of the heart.  We moved to the posterior wall of the heart, and found a good target on the distal RCA.  An arteriotomy was made, and the vein graft was anastomosed to it in an end to side fashion.  Next we exposed the lateral wall, and found a good target on the OM.  An end to side anastomosis with the vein graft was then created.  Finally, we exposed a good target on the  LAD, and fashioned an end to side anastomosis between it and the LITA.  We began to re-warm, and a re-animation dose of cardioplegia was given.  The heart was de-aired, and the cross clamp was removed.  Meticulous hemostasis was obtained.    A partial occludding clamp was then  placed on the ascending aorta, and we created an end to side anastomosis between it and the proximal vein grafts.  Rings were placed on the proximal anastomosis.  Hemostasis was obtained, and we separated from cardiopulmonary bypass without event.  The heparin  was reversed with protamine .  Chest tubes and wires were placed, and the sternum was re-approximated with sternal wires.  The soft tissue and skin were re-approximated wth absorbable suture.    The patient tolerated the procedure without any immediate complications, and was transferred to the ICU in guarded condition.  Timothy Holloway Timothy Holloway

## 2024-01-08 NOTE — Anesthesia Procedure Notes (Addendum)
 Central Venous Catheter Insertion Performed by: Leopoldo Bruckner, MD, anesthesiologist Start/End12/09/2023 1:14 PM, 01/08/2024 1:26 PM Patient location: OR. Preanesthetic checklist: patient identified, IV checked, site marked, risks and benefits discussed, surgical consent, monitors and equipment checked, pre-op  evaluation, timeout performed and anesthesia consent Position: supine Patient sedated Hand hygiene performed  and maximum sterile barriers used  Catheter size: 8.5 Fr Sheath introducer Procedure performed using ultrasound to evaluate access site. Ultrasound Notes:relevant anatomy identified, ultrasound used to visualize needle entry, vessel patent under ultrasound and image(s) printed for medical record. Attempts: 1 Following insertion, line sutured and dressing applied. Post procedure assessment: blood return through all ports, free fluid flow and no air  Patient tolerated the procedure well with no immediate complications.

## 2024-01-08 NOTE — Progress Notes (Signed)
 eLink Physician-Brief Progress Note Patient Name: Timothy Holloway DOB: [birth year] MRN: [id number]   Date of Service  01/08/2024  HPI/Events of Note  68/M with ESRD, DM, presented 01/05/2024 with chest pain/pressure during HD. He was admitted to [facility] where cardiac cath revealed triple vessel CAD. He was then transferred to this facility for revascularization. CABG x3 was performed, EBL 1100. No complications reported. Pt was transferred to the ICU post op intubated, in stable condition.   Labs and imaging briefly reviewed   eICU Interventions  NSTEMI, s/p CABG x 3 - Admit to ICU - Maintain on continuous cardiac monitoring. - Monitor for signs of bleeding. Follow output per chest tube, mediastinal drain, surgical site.   - On minimal phenylephrine  at this time. Will wean as tolerated.  - Vent support as below - Monitor I/Os, daily weights - On insulin  drip for glucose control  Post op vent support - Pt kept intubated post op - Will maintain TV 4-9ml/kg PBW, target plateau pressures <30 - Downtitrate FiO2, PEEP to maintain SpO2 >90% - Currently sedated with precedex .  - Will plan for DSI, SBT as appropriate.   ESRD on HD - Will resume hemodialysis per nephrology  DVT Prophylaxis  - SCDs. Will defer initiaion of chemical anticoagulation to surgical service.         Ervin Rothbauer M DELA CRUZ 01/08/2024, 7:29 PM

## 2024-01-08 NOTE — Brief Op Note (Signed)
 01/05/2024 - 01/08/2024  6:06 PM  PATIENT:  Timothy Holloway  67 y.o. male  PRE-OPERATIVE DIAGNOSIS:  Coronary artery disease  POST-OPERATIVE DIAGNOSIS:  Coronary artery disease  PROCEDURE:  Procedure(s) with comments:  CORONARY ARTERY BYPASS GRAFTING x 3 -LIMA to LAD -SVG to OM -SVG to DISTAL RCA  ECHOCARDIOGRAM, TRANSESOPHAGEAL (N/A)  ENDOSCOPIC HARVEST GREATER SAPHENOUS VEIN -Right Leg Vein harvest time: 38 min Vein prep time: 15 min  SURGEON:  Surgeons and Role:    * Shyrl Linnie KIDD, MD - Primary  PHYSICIAN ASSISTANT: Rocky Shad PA-C, Con Helm PA-C  ASSISTANTS: Mariel Mayotte RNFA   ANESTHESIA:   general  EBL:  1100 mL   BLOOD ADMINISTERED: Per perfusion records  DRAINS: Left Pleural Chest Tube, Mediastinal Chest Drains   LOCAL MEDICATIONS USED:  NONE  SPECIMEN:  No Specimen  DISPOSITION OF SPECIMEN:  N/A  COUNTS:  YES  TOURNIQUET:  * No tourniquets in log *  DICTATION: .Dragon Dictation  PLAN OF CARE: Admit to inpatient   PATIENT DISPOSITION:  ICU - intubated and hemodynamically stable.   Delay start of Pharmacological VTE agent (>24hrs) due to surgical blood loss or risk of bleeding: yes

## 2024-01-08 NOTE — Progress Notes (Signed)
 Received patient in bed to unit.  Alert and oriented.  Informed consent signed and in chart.   TX duration: 3 hours and 16 minutes.  OR doctor said to get him off early and to stop heparin  drip  Patient tolerated well.  Transported back to the room  Alert, without acute distress.  Hand-off given to patient's nurse.   Access used: R HD cath IJ Access issues: BFR to 300 due to high arterial pressures  Total UF removed: 2.7L   01/08/24 1123  Vitals  Temp 97.6 F (36.4 C)  BP (!) 158/59  Pulse Rate (!) 51 (Simultaneous filing. User may not have seen previous data.)  ECG Heart Rate (!) 50  Resp 14 (Simultaneous filing. User may not have seen previous data.)  Oxygen Therapy  SpO2 95 % (Simultaneous filing. User may not have seen previous data.)  O2 Device Room Air  Patient Activity (if Appropriate) In bed  Pulse Oximetry Type Continuous  Oximetry Probe Site Changed No  During Treatment Monitoring  Blood Flow Rate (mL/min) 300 mL/min  Arterial Pressure (mmHg) -142.42 mmHg  Venous Pressure (mmHg) 136.36 mmHg  TMP (mmHg) 26.46 mmHg  Ultrafiltration Rate (mL/min) 1212 mL/min  Dialysate Flow Rate (mL/min) 300 ml/min  Dialysate Potassium Concentration 3  Dialysate Calcium  Concentration 2.5  Duration of HD Treatment -hour(s) 3.22 hour(s)  Cumulative Fluid Removed (mL) per Treatment  2657.05  HD Safety Checks Performed Yes  Intra-Hemodialysis Comments See progress note (OR doctor had him come off at 3 hours and 13 minutes and to stop Heparin  drip)  Post Treatment  Dialyzer Clearance Heavily streaked  Liters Processed 59.9  Fluid Removed (mL) 2700 mL  Tolerated HD Treatment Yes  Hemodialysis Catheter Right Internal jugular Double lumen Permanent (Tunneled)  Placement Date/Time: 10/23/23 0931   Placed prior to admission: No  Serial / Lot #: [id number]  Expiration Date: 06/30/28  Time Out: Correct patient;Correct site;Correct procedure  Maximum sterile barrier precautions: Hand  hygiene;Large sterile sheet;C...  Site Condition No complications  Blue Lumen Status Flushed;Antimicrobial dead end cap;Heparin  locked  Red Lumen Status Flushed;Antimicrobial dead end cap;Heparin  locked  Purple Lumen Status N/A  Catheter fill solution Heparin  1000 units/ml  Catheter fill volume (Arterial) 1.6 cc  Catheter fill volume (Venous) 1.6  Dressing Type Transparent  Dressing Status Clean, Dry, Intact  Drainage Description None  Dressing Change Due 01/12/24  Post treatment catheter status Capped and Clamped     Camellia Brasil LPN [facility]

## 2024-01-08 NOTE — Progress Notes (Signed)
 PHARMACY - ANTICOAGULATION CONSULT NOTE  Pharmacy Consult for Heparin  Indication: chest pain/ACS, 3V CAD, awaiting CABG  Allergies  Allergen Reactions   Latex Hives   Lodine [Etodolac] Nausea And Vomiting    Patient Measurements: Height: 6' (182.9 cm) Weight: 133.2 kg (293 lb 10.4 oz) IBW/kg (Calculated) : 77.6 HEPARIN  DW (KG): 107.4  Vital Signs: Temp: 97.5 F (36.4 C) (12/08 0730) Temp Source: Oral (12/08 0500) BP: 143/60 (12/08 0930) Pulse Rate: 47 (12/08 0930)  Labs: Recent Labs    01/07/24 0454 01/07/24 0918 01/07/24 1919 01/07/24 2355 01/08/24 0653  HGB 8.7*  --   --  8.3* 8.6*  HCT 26.2*  --   --  24.9* 26.0*  PLT 247  --   --  246 246  APTT  --  56*  --   --   --   LABPROT  --  14.6  --   --   --   INR  --  1.1  --   --   --   HEPARINUNFRC  --  0.21* 0.45  --  0.37  CREATININE 5.89*  --   --  7.18* 7.49*    Estimated Creatinine Clearance: 13 mL/min (A) (by C-G formula based on SCr of 7.49 mg/dL (H)).  Assessment: 68 year old male presents from dialysis with chest pain ongoing for the past three days. Cardiac enzymes are elevated now s/p heart cath 12/5 and found to have severe 3 vessel disease. Planning CABG on 01/08/24.  No anticoagulation prior to admission. Pharmacy consulted for heparin  dosing.  Heparin  level therapeutic, CABG later today.  Goal of Therapy:  Heparin  level 0.3-0.7 units/ml Monitor platelets by anticoagulation protocol: Yes   Plan:  Cont heparin  drip 2400 units/hr. Daily heparin  level and CBC while on heparin .   Ozell Jamaica, PharmD, BCPS, [facility] Clinical Pharmacist [phone] Please check AMION for all [facility] Pharmacy numbers 01/08/2024

## 2024-01-08 NOTE — Procedures (Signed)
 Extubation Procedure Note  Patient Details:   Name: Timothy Holloway DOB: [birth year] MRN: [id number]   Airway Documentation:    Vent end date: 01/08/24 Vent end time: 2110   Evaluation  O2 sats: stable throughout Complications: No apparent complications Patient did tolerate procedure well. Bilateral Breath Sounds: Clear, Diminished   Yes  Pt. Extubated per Rapid wean protocol. Pt. Performed -27 on the NIF and .8 liters on the vital capacity. Pt. Had a positive leak test before extubation. Pt. Was able to speak his name after the tube was removed. Pt. Placed on 4 liter nasal cannula. Pt. Performed 500 x 5 on the IS. RN at bedside. RT will continue to monitor.  Delores Elsie Shove 01/08/2024, 9:16 PM

## 2024-01-08 NOTE — Progress Notes (Addendum)
Time adjustment

## 2024-01-08 NOTE — Progress Notes (Signed)
 [facility] Progress Note   Subjective:   Seen in [facility]. On dialysis. No complaints this am. Denies cp, sob.  For CABG this afternoon   Objective Vitals:   01/08/24 0715 01/08/24 0730 01/08/24 0736 01/08/24 0745  BP:   (!) 144/58 135/61  Pulse:  (!) 53 (!) 53 (!) 52  Resp:  13 14 13   Temp:  (!) 97.5 F (36.4 C)    TempSrc:      SpO2:  94% 97% 97%  Weight: 134 kg 133.2 kg    Height:       Physical Exam General: alert, nad  Heart: RRR Lungs: CTAB, nml WOB on RA Abdomen: soft, NTND Extremities: trace LE edema Dialysis Access: [facility]   Filed Weights   01/05/24 1011 01/08/24 0715 01/08/24 0730  Weight: 131.5 kg 134 kg 133.2 kg    Intake/Output Summary (Last 24 hours) at 01/08/2024 0826 Last data filed at 01/08/2024 0400 Gross per 24 hour  Intake 959.86 ml  Output 250 ml  Net 709.86 ml    Additional Objective Labs: Basic Metabolic Panel: Recent Labs  Lab 01/06/24 0417 01/07/24 0454 01/07/24 2355 01/08/24 0653  NA 134* 133* 131* 134*  K 3.9 3.8 4.3 4.1  CL 97* 97* 96* 96*  CO2 26 25 22 25   GLUCOSE 194* 210* 320* 180*  BUN 29* 52* 71* 74*  CREATININE 4.02* 5.89* 7.18* 7.49*  CALCIUM  8.2* 7.9* 8.0* 8.1*  PHOS 3.1 5.0*  --  5.8*   Liver Function Tests: Recent Labs  Lab 01/06/24 0417 01/07/24 0454 01/08/24 0653  ALBUMIN  2.9* 2.6* 2.6*   CBC: Recent Labs  Lab 01/05/24 1018 01/06/24 0417 01/07/24 0454 01/07/24 2355 01/08/24 0653  WBC 12.2* 12.6* 11.1* 10.4 11.4*  HGB 9.6* 9.9* 8.7* 8.3* 8.6*  HCT 30.0* 30.2* 26.2* 24.9* 26.0*  MCV 96.5 93.5 92.3 92.6 93.5  PLT 253 280 247 246 246   CBG: Recent Labs  Lab 01/07/24 0741 01/07/24 1140 01/07/24 1625 01/07/24 2238 01/08/24 0500  GLUCAP 180* 303* 354* 341* 201*   Iron Studies:  Recent Labs    01/05/24 1210  IRON 36*  TIBC 298  FERRITIN 1,105*   Lab Results  Component Value Date   INR 1.1 01/07/2024   INR 1.1 11/11/2022   INR 1.05 04/06/2009   Studies/Results: VAS US   DOPPLER PRE CABG Result Date: 01/07/2024 PREOPERATIVE VASCULAR EVALUATION Patient Name:  Timothy Holloway  Date of Exam:   01/07/2024 Medical Rec #: [id number]            Accession #:    [id number] Date of Birth: 09-09-1955             Patient Gender: M Patient Age:   68 years Exam Location:  [facility] Procedure:      VAS US  DOPPLER PRE CABG Referring Phys: HARRELL LIGHTFOOT --------------------------------------------------------------------------------  Indications:            Pre-CABG. Risk Factors:           Hypertension, hyperlipidemia, Diabetes, coronary artery                         disease, PAD. Other Factors:          Obesity, on HD. Vascular Interventions: 07/26/22- Right upper arm AVF creation.                         01/11/22 - Left arm AVF creation.  Performing Technologist: Ricka Sturdivant-Jones RDMS, RVT  Examination Guidelines: A complete evaluation includes B-mode imaging, spectral Doppler, color Doppler, and power Doppler as needed of all accessible portions of each vessel. Bilateral testing is considered an integral part of a complete examination. Limited examinations for reoccurring indications may be performed as noted.  Right Carotid Findings: +----------+--------+--------+--------+--------+--------+           PSV cm/sEDV cm/sStenosisDescribeComments +----------+--------+--------+--------+--------+--------+ CCA Prox  60      20                               +----------+--------+--------+--------+--------+--------+ CCA Distal86      27                               +----------+--------+--------+--------+--------+--------+ ICA Prox  153     46      40-59%  calcific         +----------+--------+--------+--------+--------+--------+ ICA Mid   169     46      40-59%  calcific         +----------+--------+--------+--------+--------+--------+ ICA Distal100     29                               +----------+--------+--------+--------+--------+--------+  ECA       93      18                               +----------+--------+--------+--------+--------+--------+ +----------+--------+-------+----------------+------------+           PSV cm/sEDV cmsDescribe        Arm Pressure +----------+--------+-------+----------------+------------+ [id number]            Multiphasic, [id number]          +----------+--------+-------+----------------+------------+ +---------+--------+--+--------+--+---------+ VertebralPSV cm/s52EDV cm/s19Antegrade +---------+--------+--+--------+--+---------+ Left Carotid Findings: +----------+--------+--------+--------+----------------------+---------+           PSV cm/sEDV cm/sStenosisDescribe              Comments  +----------+--------+--------+--------+----------------------+---------+ CCA Prox  61      0                                               +----------+--------+--------+--------+----------------------+---------+ CCA Distal31      8                                               +----------+--------+--------+--------+----------------------+---------+ ICA Prox  358     126     80-99%  irregular and calcificShadowing +----------+--------+--------+--------+----------------------+---------+ ICA Mid   91      26                                              +----------+--------+--------+--------+----------------------+---------+ ICA Distal75      10                                              +----------+--------+--------+--------+----------------------+---------+  ECA       381     52      >50%                                    +----------+--------+--------+--------+----------------------+---------+ +----------+--------+--------+--------+------------+ SubclavianPSV cm/sEDV cm/sDescribeArm Pressure +----------+--------+--------+--------+------------+           97                                   +----------+--------+--------+--------+------------+  +---------+--------+--+--------+--+ VertebralPSV cm/s85EDV cm/s23 +---------+--------+--+--------+--+  ABI Findings: +------------------+-----+---------+----------------+ Rt Pressure (mmHg)IndexWaveform Comment          +------------------+-----+---------+----------------+ 176                    triphasic                 +------------------+-----+---------+----------------+                        biphasic non compressible +------------------+-----+---------+----------------+                        biphasic non compressible +------------------+-----+---------+----------------+ 84                0.48 Abnormal                  +------------------+-----+---------+----------------+ +------------------+-----+---------+----------------+ Lt Pressure (mmHg)IndexWaveform Comment          +------------------+-----+---------+----------------+                        triphasicNon compressible +------------------+-----+---------+----------------+                                 non compressible +------------------+-----+---------+----------------+                                 non compressible +------------------+-----+---------+----------------+ 90                0.51 Abnormal                  +------------------+-----+---------+----------------+ +-------+---------------+ ABI/TBIToday's ABI/TBI +-------+---------------+ Right  [place] / 0.48       +-------+---------------+ Left   South  / 0.51       +-------+---------------+  Right Doppler Findings: +--------+--------+---------+ Site    PressureDoppler   +--------+--------+---------+ [id number]     triphasic +--------+--------+---------+ Radial          triphasic +--------+--------+---------+ Ulnar           triphasic +--------+--------+---------+  Left Doppler Findings: +--------+---------+----------------+ Site    Doppler  Comments         +--------+---------+----------------+ BrachialtriphasicNon  compressible +--------+---------+----------------+ Radial  triphasic                 +--------+---------+----------------+ Ulnar   triphasic                 +--------+---------+----------------+   Summary: Right Carotid: Velocities in the right ICA are consistent with a 40-59%                stenosis. Left Carotid: Velocities in the left ICA are consistent with a 80-99% stenosis. Vertebrals:  Bilateral vertebral arteries demonstrate antegrade flow. Subclavians: Normal  flow hemodynamics were seen in bilateral subclavian              arteries. Right ABI: Resting right ankle-brachial index indicates noncompressible right lower extremity arteries. The right toe-brachial index is abnormal. Left ABI: Resting left ankle-brachial index indicates noncompressible left lower extremity arteries. The left toe-brachial index is abnormal. Right Upper Extremity: Doppler waveform obliterate with right radial compression. Doppler waveforms decrease 50% with right ulnar compression. Left Upper Extremity: Doppler waveform obliterate with left radial compression. Doppler waveforms decrease 50% with left ulnar compression.  Electronically signed by Debby Robertson on 01/07/2024 at 8:40:42 PM.    Final    DG Chest 2 View Result Date: 01/07/2024 EXAM: 2 VIEW(S) XRAY OF THE CHEST 01/07/2024 08:33:00 AM COMPARISON: 01/05/2024 CLINICAL HISTORY: CABG (coronary artery bypass graft) planned. Coronary artery disease. FINDINGS: LINES, TUBES AND DEVICES: Tunneled right IJ hemodialysis catheter in stable position, tip projects over the SVC. LUNGS AND PLEURA: Upper zone pulmonary vascular prominence favoring pulmonary venous hypertension. No focal pulmonary opacity. No pleural effusion. No pneumothorax. HEART AND MEDIASTINUM: Stable mild cardiomegaly. Aortic atherosclerosis. BONES AND SOFT TISSUES: Multilevel thoracic osteophytosis. No acute osseous abnormality. IMPRESSION: 1. Stable mild cardiomegaly and aortic atherosclerosis. 2. Upper  zone pulmonary vascular prominence favoring pulmonary venous hypertension. 3. Several chronic and incidental findings are present, including a tunneled right IJ hemodialysis catheter in stable position and multilevel thoracic osteophytosis. Electronically signed by: Ryan Salvage MD 01/07/2024 12:43 PM EST RP Workstation: [id number]    Medications:  anticoagulant sodium citrate       ceFAZolin  (ANCEF ) IV     dexmedetomidine      heparin  30,000 units/NS 1000 mL solution for CELLSAVER     heparin  2,400 Units/hr (01/08/24 0430)   milrinone      nitroGLYCERIN      norepinephrine      tranexamic acid  (CYKLOKAPRON ) 2,500 mg in sodium chloride  0.9 % 250 mL (10 mg/mL) infusion     vancomycin       aspirin  EC  81 mg Oral Daily   atorvastatin   80 mg Oral Daily    ceFAZolin  (ANCEF ) IV  3 g Intravenous To OR   chlorhexidine   15 mL Mouth/Throat Once   Chlorhexidine  Gluconate Cloth  6 each Topical Q0600   cloNIDine   0.1 mg Oral BID   epinephrine   0-10 mcg/min Intravenous To OR   fenofibrate   54 mg Oral Daily   heparin  sodium (porcine) 2,500 Units, papaverine  30 mg in electrolyte-A (PLASMALYTE-A PH 7.4) 500 mL irrigation   Irrigation To OR   hydrALAZINE   100 mg Oral BID   insulin  aspart  0-6 Units Subcutaneous TID WC   insulin  glargine  40 Units Subcutaneous QHS   insulin    Intravenous To OR   isosorbide  mononitrate  30 mg Oral QHS   [facility] Blood Cardioplegia vial (lidocaine /magnesium /mannitol  0.26g-4g-6.4g)   Intracoronary Once   metoprolol  tartrate  12.5 mg Oral Once   phenylephrine   30-200 mcg/min Intravenous To OR   potassium chloride   80 mEq Other To OR   sodium chloride  flush  3 mL Intravenous Q12H   sucroferric oxyhydroxide  500 mg Oral TID WC   tranexamic acid   15 mg/kg Intravenous To OR   tranexamic acid   2 mg/kg Intracatheter To OR    Dialysis Orders: [facility] MWF 4:00 350/500 EDW 133.5 kg 2K/2.5 Ca TDC Heparin  2000 + 1400/hr  Calcitriol 1.25 three times per week Mircera 90  mcg q 2 wks (last 12/1)    Assessment/Plan: NSTEMI - LHC 12/5 with severe  3V CAD. EF 30-35%. Plan for CABG this afternoon  ESRD - HD MWF. HD today Access- Using [facility]. H/o failed AVF x 2  Hypertension - BP acceptable. Continue home meds - carvedilol  6.25mg  BID, clonidine  0.1mg  BID, hydralazine  100mg  BID,  and losartan  100mg  every day.  Volume - CXR pre HD with possible pulmonary edema.  Does not appear grossly overloaded.  Continue UF as tolerated.  Anemia - Hgb 8.7. ESA recently dosed as outpatient. No iron with high ferritin.  Continue to monitor.  Transfuse prn for Hgb<7.  Metabolic bone disease - Ca and phos in goal. Continue home binder and VDRA. Nutrition - Renal diet with fluid restriction  DMT2 - Insulin  per primary   Maisie Ronnald Acosta PA-C [facility] 01/08/2024,8:26 AM

## 2024-01-08 NOTE — Anesthesia Procedure Notes (Signed)
 Arterial Line Insertion Start/End12/09/2023 1:00 PM, 01/08/2024 1:02 PM Performed by: Lansing Lash, RN  Patient location: Pre-op . Preanesthetic checklist: patient identified, IV checked, site marked, risks and benefits discussed, surgical consent, monitors and equipment checked, pre-op  evaluation, timeout performed and anesthesia consent Lidocaine  1% used for infiltration Left, radial was placed Catheter size: 20 G Hand hygiene performed  and maximum sterile barriers used   Attempts: 1 Following insertion, dressing applied and Biopatch. Post procedure assessment: normal and unchanged  Patient tolerated the procedure well with no immediate complications.

## 2024-01-08 NOTE — Progress Notes (Signed)
 Wean protocol initiated.

## 2024-01-08 NOTE — Anesthesia Procedure Notes (Addendum)
 Procedure Name: Intubation Date/Time: 01/08/2024 1:05 PM  Performed by: Leopoldo Bruckner, MDPre-anesthesia Checklist: Patient identified, Emergency Drugs available, Suction available and Patient being monitored Patient Re-evaluated:Patient Re-evaluated prior to induction Oxygen Delivery Method: Circle System Utilized Preoxygenation: Pre-oxygenation with 100% oxygen Induction Type: IV induction Ventilation: Mask ventilation without difficulty Laryngoscope Size: Mac and 4 Grade View: Grade I Tube type: Oral Tube size: 8.0 mm Number of attempts: 1 Airway Equipment and Method: Stylet and Oral airway Placement Confirmation: ETT inserted through vocal cords under direct vision, positive ETCO2 and breath sounds checked- equal and bilateral Secured at: 23 cm Tube secured with: Tape Dental Injury: Teeth and Oropharynx as per pre-operative assessment

## 2024-01-08 NOTE — Anesthesia Postprocedure Evaluation (Signed)
 Anesthesia Post Note  Patient: Timothy Holloway  Procedure(s) Performed: CORONARY ARTERY BYPASS GRAFTING (CABG) TIMES 3 USING LEFT INTERNAL MAMMARY ARTERY AND ENDOSCOPICALLY HARVESTED RIGHT GREATER SAPHENOUS VEIN (Chest) ECHOCARDIOGRAM, TRANSESOPHAGEAL     Patient location during evaluation: SICU Anesthesia Type: General Level of consciousness: sedated Pain management: pain level controlled Vital Signs Assessment: post-procedure vital signs reviewed and stable Respiratory status: patient remains intubated per anesthesia plan Cardiovascular status: stable Postop Assessment: no apparent nausea or vomiting Anesthetic complications: no   No notable events documented.  Last Vitals:  Vitals:   01/08/24 1125 01/08/24 1208  BP: 136/67 (!) 152/49  Pulse: (!) 52 (!) 55  Resp: 14 18  Temp: 36.6 C 36.6 C  SpO2: 98% 95%    Last Pain:  Vitals:   01/08/24 1217  TempSrc:   PainSc: 0-No pain                 Kenndra Morris,W. EDMOND

## 2024-01-08 NOTE — Plan of Care (Signed)
   Problem: Clinical Measurements: Goal: Diagnostic test results will improve Outcome: Progressing Goal: Cardiovascular complication will be avoided Outcome: Progressing   Problem: Coping: Goal: Level of anxiety will decrease Outcome: Progressing

## 2024-01-09 ENCOUNTER — Encounter (HOSPITAL_COMMUNITY): Payer: Self-pay | Admitting: Thoracic Surgery (Cardiothoracic Vascular Surgery)

## 2024-01-09 ENCOUNTER — Inpatient Hospital Stay (HOSPITAL_COMMUNITY)

## 2024-01-09 DIAGNOSIS — Z992 Dependence on renal dialysis: Secondary | ICD-10-CM | POA: Diagnosis not present

## 2024-01-09 DIAGNOSIS — N186 End stage renal disease: Secondary | ICD-10-CM | POA: Diagnosis not present

## 2024-01-09 DIAGNOSIS — I5021 Acute systolic (congestive) heart failure: Secondary | ICD-10-CM | POA: Diagnosis not present

## 2024-01-09 DIAGNOSIS — Z951 Presence of aortocoronary bypass graft: Secondary | ICD-10-CM

## 2024-01-09 DIAGNOSIS — I214 Non-ST elevation (NSTEMI) myocardial infarction: Secondary | ICD-10-CM | POA: Diagnosis not present

## 2024-01-09 LAB — BASIC METABOLIC PANEL WITH GFR
Anion gap: 14 (ref 5–15)
Anion gap: 8 (ref 5–15)
BUN: 38 mg/dL — ABNORMAL HIGH (ref 8–23)
BUN: 48 mg/dL — ABNORMAL HIGH (ref 8–23)
CO2: 18 mmol/L — ABNORMAL LOW (ref 22–32)
CO2: 27 mmol/L (ref 22–32)
Calcium: 7.8 mg/dL — ABNORMAL LOW (ref 8.9–10.3)
Calcium: 7.8 mg/dL — ABNORMAL LOW (ref 8.9–10.3)
Chloride: 97 mmol/L — ABNORMAL LOW (ref 98–111)
Chloride: 99 mmol/L (ref 98–111)
Creatinine, Ser: 4.91 mg/dL — ABNORMAL HIGH (ref 0.61–1.24)
Creatinine, Ser: 5.94 mg/dL — ABNORMAL HIGH (ref 0.61–1.24)
GFR, Estimated: 10 mL/min — ABNORMAL LOW (ref >60)
GFR, Estimated: 12 mL/min — ABNORMAL LOW (ref >60)
Glucose, Bld: 135 mg/dL — ABNORMAL HIGH (ref 70–99)
Glucose, Bld: 223 mg/dL — ABNORMAL HIGH (ref 70–99)
Potassium: 4.7 mmol/L (ref 3.5–5.1)
Potassium: 5.7 mmol/L — ABNORMAL HIGH (ref 3.5–5.1)
Sodium: 129 mmol/L — ABNORMAL LOW (ref 135–145)
Sodium: 134 mmol/L — ABNORMAL LOW (ref 135–145)

## 2024-01-09 LAB — RENAL FUNCTION PANEL
Albumin: 2.6 g/dL — ABNORMAL LOW (ref 3.5–5.0)
Anion gap: 19 — ABNORMAL HIGH (ref 5–15)
BUN: 40 mg/dL — ABNORMAL HIGH (ref 8–23)
CO2: 15 mmol/L — ABNORMAL LOW (ref 22–32)
Calcium: 7.9 mg/dL — ABNORMAL LOW (ref 8.9–10.3)
Chloride: 100 mmol/L (ref 98–111)
Creatinine, Ser: 5.08 mg/dL — ABNORMAL HIGH (ref 0.61–1.24)
GFR, Estimated: 12 mL/min — ABNORMAL LOW (ref >60)
Glucose, Bld: 131 mg/dL — ABNORMAL HIGH (ref 70–99)
Phosphorus: 5.8 mg/dL — ABNORMAL HIGH (ref 2.5–4.6)
Potassium: 4.8 mmol/L (ref 3.5–5.1)
Sodium: 134 mmol/L — ABNORMAL LOW (ref 135–145)

## 2024-01-09 LAB — CBC
HCT: 26.1 % — ABNORMAL LOW (ref 39.0–52.0)
HCT: 26.3 % — ABNORMAL LOW (ref 39.0–52.0)
HCT: 27.3 % — ABNORMAL LOW (ref 39.0–52.0)
Hemoglobin: 8.5 g/dL — ABNORMAL LOW (ref 13.0–17.0)
Hemoglobin: 8.6 g/dL — ABNORMAL LOW (ref 13.0–17.0)
Hemoglobin: 8.8 g/dL — ABNORMAL LOW (ref 13.0–17.0)
MCH: 30.9 pg (ref 26.0–34.0)
MCH: 31.4 pg (ref 26.0–34.0)
MCH: 31.7 pg (ref 26.0–34.0)
MCHC: 32.2 g/dL (ref 30.0–36.0)
MCHC: 32.3 g/dL (ref 30.0–36.0)
MCHC: 33 g/dL (ref 30.0–36.0)
MCV: 95.6 fL (ref 80.0–100.0)
MCV: 96.3 fL (ref 80.0–100.0)
MCV: 97.5 fL (ref 80.0–100.0)
Platelets: 226 K/uL (ref 150–400)
Platelets: 237 K/uL (ref 150–400)
Platelets: 239 K/uL (ref 150–400)
RBC: 2.71 MIL/uL — ABNORMAL LOW (ref 4.22–5.81)
RBC: 2.75 MIL/uL — ABNORMAL LOW (ref 4.22–5.81)
RBC: 2.8 MIL/uL — ABNORMAL LOW (ref 4.22–5.81)
RDW: 15.1 % (ref 11.5–15.5)
RDW: 15.2 % (ref 11.5–15.5)
RDW: 15.6 % — ABNORMAL HIGH (ref 11.5–15.5)
WBC: 16.3 K/uL — ABNORMAL HIGH (ref 4.0–10.5)
WBC: 16.9 K/uL — ABNORMAL HIGH (ref 4.0–10.5)
WBC: 19 K/uL — ABNORMAL HIGH (ref 4.0–10.5)
nRBC: 0 % (ref 0.0–0.2)
nRBC: 0 % (ref 0.0–0.2)
nRBC: 0.1 % (ref 0.0–0.2)

## 2024-01-09 LAB — COOXEMETRY PANEL
Carboxyhemoglobin: 2.1 % — ABNORMAL HIGH (ref 0.5–1.5)
Methemoglobin: <0.7 % (ref 0.0–1.5)
O2 Saturation: 68.6 %
Total hemoglobin: 7.5 g/dL — ABNORMAL LOW (ref 12.0–16.0)

## 2024-01-09 LAB — GLUCOSE, CAPILLARY
Glucose-Capillary: 126 mg/dL — ABNORMAL HIGH (ref 70–99)
Glucose-Capillary: 128 mg/dL — ABNORMAL HIGH (ref 70–99)
Glucose-Capillary: 130 mg/dL — ABNORMAL HIGH (ref 70–99)
Glucose-Capillary: 131 mg/dL — ABNORMAL HIGH (ref 70–99)
Glucose-Capillary: 134 mg/dL — ABNORMAL HIGH (ref 70–99)
Glucose-Capillary: 144 mg/dL — ABNORMAL HIGH (ref 70–99)
Glucose-Capillary: 176 mg/dL — ABNORMAL HIGH (ref 70–99)
Glucose-Capillary: 186 mg/dL — ABNORMAL HIGH (ref 70–99)
Glucose-Capillary: 209 mg/dL — ABNORMAL HIGH (ref 70–99)

## 2024-01-09 LAB — MAGNESIUM
Magnesium: 3 mg/dL — ABNORMAL HIGH (ref 1.7–2.4)
Magnesium: 3.1 mg/dL — ABNORMAL HIGH (ref 1.7–2.4)
Magnesium: 3.1 mg/dL — ABNORMAL HIGH (ref 1.7–2.4)

## 2024-01-09 MED ORDER — INSULIN ASPART 100 UNIT/ML IJ SOLN: 0.0000 [IU] | Freq: Every day | INTRAMUSCULAR | Status: DC

## 2024-01-09 MED ORDER — MIDODRINE HCL 5 MG PO TABS
10.0000 mg | ORAL_TABLET | Freq: Three times a day (TID) | ORAL | Status: DC
  Administered 2024-01-09: 10 mg via ORAL
  Filled 2024-01-09: qty 2

## 2024-01-09 MED ORDER — STROKE: EARLY STAGES OF RECOVERY BOOK
Freq: Once | Status: AC
  Filled 2024-01-09: qty 1

## 2024-01-09 MED ORDER — SODIUM CHLORIDE 0.9% FLUSH
10.0000 mL | Freq: Two times a day (BID) | INTRAVENOUS | Status: DC
  Administered 2024-01-09 – 2024-01-13 (×8): 10 mL
  Administered 2024-01-13: 40 mL
  Administered 2024-01-14 – 2024-01-18 (×7): 10 mL

## 2024-01-09 MED ORDER — ENOXAPARIN SODIUM 30 MG/0.3ML IJ SOSY
30.0000 mg | PREFILLED_SYRINGE | Freq: Every day | INTRAMUSCULAR | Status: DC
  Administered 2024-01-09: 30 mg via SUBCUTANEOUS
  Filled 2024-01-09: qty 0.3

## 2024-01-09 MED ORDER — SODIUM CHLORIDE 0.9 % IV SOLN: INTRAVENOUS | Status: DC

## 2024-01-09 MED ORDER — IOHEXOL 350 MG/ML SOLN
75.0000 mL | Freq: Once | INTRAVENOUS | Status: AC | PRN
  Administered 2024-01-09: 75 mL via INTRAVENOUS

## 2024-01-09 MED ORDER — SODIUM CHLORIDE 0.9% FLUSH: 10.0000 mL | INTRAVENOUS | Status: DC | PRN

## 2024-01-09 MED ORDER — ORAL CARE MOUTH RINSE: 15.0000 mL | OROMUCOSAL | Status: DC | PRN

## 2024-01-09 MED ORDER — INSULIN ASPART 100 UNIT/ML IJ SOLN
0.0000 [IU] | INTRAMUSCULAR | Status: DC
  Administered 2024-01-09 (×2): 2 [IU] via SUBCUTANEOUS
  Administered 2024-01-09: 4 [IU] via SUBCUTANEOUS
  Filled 2024-01-09: qty 2
  Filled 2024-01-09: qty 4
  Filled 2024-01-09: qty 2

## 2024-01-09 MED ORDER — INSULIN GLARGINE 100 UNIT/ML ~~LOC~~ SOLN
10.0000 [IU] | Freq: Every day | SUBCUTANEOUS | Status: DC
  Administered 2024-01-09 – 2024-01-10 (×2): 10 [IU] via SUBCUTANEOUS
  Filled 2024-01-09 (×2): qty 0.1

## 2024-01-09 MED ORDER — CEFAZOLIN SODIUM-DEXTROSE 2-4 GM/100ML-% IV SOLN
2.0000 g | INTRAVENOUS | Status: DC
  Administered 2024-01-09: 2 g via INTRAVENOUS
  Filled 2024-01-09: qty 100

## 2024-01-09 MED ORDER — INSULIN GLARGINE-YFGN 100 UNIT/ML ~~LOC~~ SOLN: 10.0000 [IU] | Freq: Every day | SUBCUTANEOUS | Status: DC

## 2024-01-09 MED ORDER — INSULIN ASPART 100 UNIT/ML IJ SOLN: 0.0000 [IU] | Freq: Three times a day (TID) | INTRAMUSCULAR | Status: DC

## 2024-01-09 MED ORDER — SODIUM ZIRCONIUM CYCLOSILICATE 10 G PO PACK
10.0000 g | PACK | Freq: Once | ORAL | Status: AC
  Administered 2024-01-09: 10 g via ORAL
  Filled 2024-01-09: qty 1

## 2024-01-09 MED ORDER — CHLORHEXIDINE GLUCONATE CLOTH 2 % EX PADS
6.0000 | MEDICATED_PAD | Freq: Every day | CUTANEOUS | Status: DC
  Administered 2024-01-09 – 2024-01-15 (×8): 6 via TOPICAL

## 2024-01-09 MED ORDER — CHLORHEXIDINE GLUCONATE CLOTH 2 % EX PADS: 6.0000 | MEDICATED_PAD | Freq: Every day | CUTANEOUS | Status: DC

## 2024-01-09 MED ORDER — TRAMADOL HCL 50 MG PO TABS: 50.0000 mg | ORAL_TABLET | Freq: Three times a day (TID) | ORAL | Status: DC | PRN

## 2024-01-09 NOTE — Progress Notes (Signed)
 Late entry:  Code stroke called this morning for new R sided weakness.  MAP 62 when I entered; NE increased for goal MAP >70. Weak but able to move the R, some mild R facial droop but symmetric smile.  More lethargic than previous exams this morning.  CT w/o acute findings. CTA showed L ICA stenosis.   Post-code stroke he was re-examined in the ICU with resolution of facial droop and R weakness. Now symmetric arm and LE strength.  Recurrent R weakness after getting up to the chair from bed with MAP in low 60s despite quickly titrating NE. V-pacing increased from 80 to 90 with improvement in MAP. I gave verbal orders at bedside for med titration; symptoms did not start to improve until MAP was about 80. He was lethargic, not answering questions, and only weakly moving R hand, otherwise not moving. After MAP 75-85 he began speaking, initially dysarthric and saying only his birthday despite being asked other questions. Slowly starting to move RUE & RLE more.  Neuro at bedside.   Will transfer back to bed; bedrest for now to prevent BP drops. Keep MAP as high as he can tolerate; post-op recommend 70-80 if symptoms are resolved. EEG, but low suspicion for seizure  Wife at bedside this afternoon, updated by CCM & Neuro.  This patient is critically ill with multiple organ system failure which requires frequent high complexity decision making, assessment, support, evaluation, and titration of therapies. This was completed through the application of advanced monitoring technologies and extensive interpretation of multiple databases. During this encounter critical care time was devoted to patient care services described in this note for 40 minutes.  [name], DO 01/09/24 2:51 PM [facility]  For contact information, see Amion. If no response to pager, please call PCCM consult pager. After hours, 7PM- 7AM, please call Elink.

## 2024-01-09 NOTE — Progress Notes (Addendum)
 NAME:  Timothy Holloway, MRN:  [id number], DOB:  01/15/1956, LOS: 4 ADMISSION DATE:  01/05/2024, CONSULTATION DATE:  01/08/24 REFERRING MD:  Raenelle POUR., CHIEF COMPLAINT:  s/p CABG   History of Present Illness:  Timothy Holloway is a 68 year old male with past medical history significant for ESRD on HD, T2DM, HTN, obesity, chronic anemia who presented to [facility] ED 01/05/24 after experiencing chest pain/pressure during dialysis. In the ED, troponins were elevated, he was started on a heparin  gtt and transferred to [facility] for a cardiac cath where he was found to have triple-vessel disease and CHF. TCTS to take patient for CABG 12/8. PCCM consulted for post-operative medical management.  OR course EBL: 1100 Cell saver 482 Products 0 Bypass time: 1hr 58 min  Clamp time 1hr 21 min   Pertinent Medical History:   Past Medical History:  Diagnosis Date   Anemia    Cataract    Chronic kidney disease (CKD), stage III (moderate) (HCC)    dr Faythe   Depression    Diabetes mellitus without complication (HCC)    DVT (deep venous thrombosis) (HCC)    as a child   ESRD on dialysis [facility])    M-W-F   Hyperlipidemia    Hypertension    Impotence    Insomnia    Obesity    Steal syndrome as complication of dialysis access    Significant Hospital Events: Including procedures, antibiotic start and stop dates in addition to other pertinent events   12/5: Transfer from [facility]->cath showed triple-vessel disease/CHF 12/8: iHD in am Pre-op   CABG w/ Dr. Shyrl, successfully extubated.  12/9 no distress. Weaning NE  Interim History / Subjective:  Pain tolerable Denies inc'd WOB Weaning NE   Objective    Blood pressure (!) 152/49, pulse 80, temperature 98.2 F (36.8 C), temperature source Oral, resp. rate 11, height 6' (1.829 m), weight 135.3 kg, SpO2 97%. CVP:  [4 mmHg-21 mmHg] 8 mmHg CO:  [4.2 L/min] 4.2 L/min CI:  [3.3 L/min/m2] 3.3 L/min/m2  Vent Mode: CPAP;PSV FiO2 (%):  [40 %-50 %] 40 % Set  Rate:  [4 bmp-16 bmp] 4 bmp Vt Set:  [620 mL] 620 mL PEEP:  [5 cmH20] 5 cmH20 Pressure Support:  [10 cmH20] 10 cmH20 Plateau Pressure:  [25 cmH20] 25 cmH20   Intake/Output Summary (Last 24 hours) at 01/09/2024 0814 Last data filed at 01/09/2024 0800 Gross per 24 hour  Intake 4450.12 ml  Output 7585 ml  Net -3134.88 ml   Filed Weights   01/08/24 1125 01/08/24 1208 01/09/24 0600  Weight: 130.2 kg 133.8 kg 135.3 kg   Examination: General resting in bed no distress HENT NCAT has left internal jugular CVL that is CD&I MMM Pulm clear. Currently 4 lpn [place] IS 249 ml.  Dec'd bases CT output 440 ml since surg CXR lines/tubes look to be in satisfactory position, there is Left basilar vol loss that is likely mix of effusion and atx  Card V  paced at 80 BPM, current CI 3.4 SVR 552 his NE has just been dec'd to 3 mcg/min  The sternal dressing is SD&I Ext warm brisk CR trace LE edema bilateral exts.  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  plan Cont tele  temporary epicardial pacing wires in place with back up rate for symptomatic bradycardia or AVB (Can prob dec back up rate) Cont to wean NE for SBP > 100 Cont to monitor CT output Drains/tubes per surgical team  Mobilize Multimodal pain rx Starting LMWH today  Complete prophylactic antibiotics   BB once hemodynamically appropriate Cont asa  Post-operative hypoxia w/ left basilar volume loss  Plan Cont to wean FIO2 for goal sats > 92% Cont pulse ox Encourage IS  Mobilize  Expected post-operative ABLA Expected post-operative consumptive thrombocytopenia  Preop hgb 8.6 Now 8.6 Plan trend  transfuse for hgb <8 or significant bleeding  Defer prophylactic AC to surgical team   ESRD on HD w/ mild  inc'd AGMA  plan HD MWF via [facility] Will defer to nephro but might need to consider CRRT   T2DM plan Transition to SSI Goal 140-180     Critical care time:  I personally  spent 32 minutes  on this patient which included: review of medical records, nursing notes, progress notes, evaluation, interpretation of lab data and diagnostic studies, taking independent history, performing exam, documenting plan, ordering diagnostics and interventions for the following critical care issues: Circulatory shock, Acute respiratory failure with the following interventions which included: titration of hemodynamic drips to desired MAP

## 2024-01-09 NOTE — Discharge Instructions (Signed)
 Discharge Instructions:  1. You may shower, please wash incisions daily with soap and water and keep dry.  If you wish to cover wounds with dressing you may do so but please keep clean and change daily.  No tub baths or swimming until incisions have completely healed.  If your incisions become red or develop any drainage please call our office at [phone]  2. No Driving until cleared by Dr. Lang office and you are no longer using narcotic pain medications  3. Monitor your weight daily.. Please use the same scale and weigh at same time... If you gain 5-10 lbs in 48 hours with associated lower extremity swelling, please contact our office at [phone]  4. Fever of 101.5 for at least 24 hours with no source, please contact our office at [phone]  5. Activity- up as tolerated, please walk at least 3 times per day.  Avoid strenuous activity, no lifting, pushing, or pulling with your arms over 8-10 lbs for a minimum of 6 weeks  6. If any questions or concerns arise, please do not hesitate to contact our office at [phone]

## 2024-01-09 NOTE — Progress Notes (Signed)
 [facility] Progress Note   Subjective:    Completed dialysis yesterday then underwent CABG x 3  This am stroke code for sudden right sided weakness. Neuro consulted  Seen in room. Up in recliner,  eating lunch. Breathing ok. No cp.  Wife at bedside, says he looks better than am.   Objective Vitals:   01/09/24 1315 01/09/24 1330 01/09/24 1345 01/09/24 1400  BP:      Pulse: 60 60 62 60  Resp: 10 15 15 11   Temp:      TempSrc:      SpO2: 99% 98% 94% 98%  Weight:      Height:       Physical Exam General: alert, nad  Heart: RRR Lungs: CTAB, nml WOB on RA Abdomen: soft, NTND Extremities: trace LE edema Dialysis Access: [facility]  Neuro: A&O, some speech slurring, bilateral grip strength intact   Filed Weights   01/08/24 1125 01/08/24 1208 01/09/24 0600  Weight: 130.2 kg 133.8 kg 135.3 kg    Intake/Output Summary (Last 24 hours) at 01/09/2024 1408 Last data filed at 01/09/2024 1100 Gross per 24 hour  Intake 4743.92 ml  Output 2225 ml  Net 2518.92 ml    Additional Objective Labs: Basic Metabolic Panel: Recent Labs  Lab 01/07/24 0454 01/07/24 2355 01/08/24 0653 01/08/24 1223 01/08/24 1745 01/08/24 1906 01/08/24 2222 01/09/24 0009 01/09/24 0412  NA 133*   < > 134*   < > 136   < > 137 134* 134*  K 3.8   < > 4.1   < > 4.5   < > 4.2 4.7 4.8  CL 97*   < > 96*   < > 99  --   --  99 100  CO2 25   < > 25  --   --   --   --  27 15*  GLUCOSE 210*   < > 180*   < > 129*  --   --  135* 131*  BUN 52*   < > 74*   < > 38*  --   --  38* 40*  CREATININE 5.89*   < > 7.49*   < > 4.50*  --   --  4.91* 5.08*  CALCIUM  7.9*   < > 8.1*  --   --   --   --  7.8* 7.9*  PHOS 5.0*  --  5.8*  --   --   --   --   --  5.8*   < > = values in this interval not displayed.   Liver Function Tests: Recent Labs  Lab 01/07/24 0454 01/08/24 0653 01/09/24 0412  ALBUMIN  2.6* 2.6* 2.6*   CBC: Recent Labs  Lab 01/07/24 2355 01/08/24 0653 01/08/24 1223 01/08/24 1905 01/08/24 1906  01/08/24 2222 01/09/24 0009 01/09/24 0412  WBC 10.4 11.4*  --  17.5*  --   --  16.9* 16.3*  HGB 8.3* 8.6*   < > 8.5*   < > 8.2* 8.5* 8.6*  HCT 24.9* 26.0*   < > 26.7*   < > 24.0* 26.3* 26.1*  MCV 92.6 93.5  --  96.4  --   --  95.6 96.3  PLT 246 246   < > 216  --   --  226 237   < > = values in this interval not displayed.   CBG: Recent Labs  Lab 01/09/24 0404 01/09/24 0626 01/09/24 0828 01/09/24 1037 01/09/24 1222  GLUCAP 131* 144* 176* 134* 130*  Iron Studies:  No results for input(s): IRON, TIBC, TRANSFERRIN, FERRITIN in the last 72 hours.  Lab Results  Component Value Date   INR 1.3 (H) 01/08/2024   INR 1.1 01/07/2024   INR 1.1 11/11/2022   Studies/Results: CT ANGIO HEAD NECK W WO CM (CODE STROKE) Result Date: 01/09/2024 EXAM: CTA HEAD AND NECK WITHOUT AND WITH 01/09/2024 09:47:00 AM TECHNIQUE: CTA of the head and neck was performed without and with the administration of 75 mL of intravenous iohexol  (OMNIPAQUE ) 350 MG/ML injection. Multiplanar 2D and/or 3D reformatted images are provided for review. Automated exposure control, iterative reconstruction, and/or weight based adjustment of the mA/kV was utilized to reduce the radiation dose to as low as reasonably achievable. Stenosis of the internal carotid arteries measured using NASCET criteria. COMPARISON: None available CLINICAL HISTORY: Stroke, follow up. FINDINGS: CTA NECK: AORTIC ARCH AND ARCH VESSELS: There is calcific atheromatous disease within the aortic arch. No dissection or arterial injury. No significant stenosis of the brachiocephalic arteries. There is moderate calcific atheromatous disease within the right subclavian artery. CERVICAL CAROTID ARTERIES: There is moderate calcific plaque within the right carotid bulb and origin of the internal carotid artery with approximately 50% luminal stenosis. There is advanced calcific atheromatous disease within the left carotid bulb and origin of the internal carotid  artery, with estimated stenosis of 80%. No dissection or arterial injury. CERVICAL VERTEBRAL ARTERIES: There is moderate-to-severe stenosis of the V2 segment of the right vertebral artery at the C4-C5 disc space level. There is also moderate stenosis of the right vertebral artery at the junction of the V3 and V4 segments and throughout the V4 segment. There is mild irregular stenosis of the left V4 segment. No dissection or arterial injury. LUNGS AND MEDIASTINUM: Unremarkable. SOFT TISSUES: No acute abnormality. BONES: No acute abnormality. CTA HEAD: ANTERIOR CIRCULATION: There is moderate calcific plaque throughout the carotid siphons and supraclinoid segments, with moderate diffuse stenosis bilaterally. No significant stenosis of the anterior cerebral arteries. No significant stenosis of the middle cerebral arteries. No aneurysm. POSTERIOR CIRCULATION: There is fetal type origin of the right posterior cerebral artery. No significant stenosis of the posterior cerebral arteries. No significant stenosis of the basilar artery. No significant stenosis of the vertebral arteries. No aneurysm. OTHER: No dural venous sinus thrombosis on this non-dedicated study. The above findings were communicated to Dr. Voncile at 10:05 AM 01/09/2024. IMPRESSION: 1. Advanced calcific atheromatous disease within the left carotid bulb and origin of the internal carotid artery, with estimated stenosis of 80%. 2. Moderate calcific plaque within the right carotid bulb and origin of the internal carotid artery with approximately 50% luminal stenosis. 3. Moderate-to-severe stenosis of the V2 segment of the right vertebral artery at the C4-5 disc space level. Moderate stenosis of the right vertebral artery at the junction of the V3 and V4 segments and throughout the V4 segment. Mild irregular stenosis of the left V4 segment. 4. Moderate calcific plaque throughout the carotid siphons and supraclinoid segments, with moderate diffuse stenosis  bilaterally. 5. Fetal type origin of the right posterior cerebral artery. 6. Critical results were communicated to Dr. Arora at 10:05 am on 01/09/2024. Electronically signed by: Evalene Coho MD 01/09/2024 10:06 AM EST RP Workstation: [id number]   CT HEAD CODE STROKE WO CONTRAST Result Date: 01/09/2024 CLINICAL DATA:  Code stroke.  Slurred speech, right-sided deficits EXAM: CT HEAD WITHOUT CONTRAST TECHNIQUE: Contiguous axial images were obtained from the base of the skull through the vertex without intravenous contrast. RADIATION DOSE REDUCTION: This  exam was performed according to the departmental dose-optimization program which includes automated exposure control, adjustment of the mA and/or kV according to patient size and/or use of iterative reconstruction technique. COMPARISON:  None Available. FINDINGS: There is no hemorrhage. No acute ischemic changes. No dense vessel. The ventricles are normal. ASPECTS [facility] Stroke Program Early CT Score) - Ganglionic level infarction (caudate, lentiform nuclei, internal capsule, insula, M1-M3 cortex): 7 - Supraganglionic infarction (M4-M6 cortex): 3 Total score (0-10 with 10 being normal): 10 IMPRESSION: 1. No acute abnormality 2. ASPECTS is 10 Electronically Signed   By: Nancyann Burns M.D.   On: 01/09/2024 09:37   DG Chest Port 1 View Result Date: 01/09/2024 EXAM: 1 VIEW XRAY OF THE CHEST 01/09/2024 05:52:00 AM COMPARISON: Portable chest radiograph yesterday at 7:14 pm. CLINICAL HISTORY: [id number] S/P CABG x 3 [id number] S/P CABG x 3 [id number] FINDINGS: LINES, TUBES AND DEVICES: Interval extubation and removal of NGT. Left IJ line remains with the tip in the mid SVC. Right IJ dialysis catheter terminates in the upper right atrium as before. Mediastinal and pericardial drains remain in place. LUNGS AND PLEURA: There is a small left pleural effusion with patchy atelectasis or consolidation in the left base. The lungs are otherwise clear allowing for low inspiration. Overall  aeration seems unchanged. There is no measurable pneumothorax. HEART AND MEDIASTINUM: Sternotomy and CABG changes are noted. Stable widening in the superior mediastinum. There is cardiomegaly and mild central vascular prominence without overt edema. BONES AND SOFT TISSUES: No acute osseous abnormality. IMPRESSION: 1. Small left pleural effusion with patchy left basilar atelectasis or consolidation. Overall aeration unchanged. 2. Cardiomegaly with mild central vascular prominence without overt edema. 3. Stable post-CABG support devices and lines as described in the body of the report. Electronically signed by: Francis Quam MD 01/09/2024 06:42 AM EST RP Workstation: [id number]   DG Abd 1 View Result Date: 01/08/2024 CLINICAL DATA:  Status post CABG, enteric catheter placement EXAM: ABDOMEN - 1 VIEW COMPARISON:  None Available. FINDINGS: Frontal view of the lower chest and upper abdomen excludes the right flank by collimation. Enteric catheter tip projects over the gastroesophageal junction, recommend advancing at least 6 cm to ensure side port placement within the gastric lumen. Bowel gas pattern is unremarkable. Postsurgical changes from CABG, with epicardial pacing wires in place. Patchy left basilar consolidation favoring atelectasis. Trace left effusion. IMPRESSION: 1. Enteric catheter tip projecting at the gastroesophageal junction, recommend advancing at least 6 cm to ensure side port placement within the gastric lumen. Electronically Signed   By: Ozell Daring M.D.   On: 01/08/2024 19:27   DG Chest Port 1 View Result Date: 01/08/2024 CLINICAL DATA:  Status post CABG, enteric catheter placement EXAM: PORTABLE CHEST 1 VIEW COMPARISON:  01/07/2024 FINDINGS: Single frontal view of the chest demonstrates endotracheal tube overlying tracheal air column, tip approximately 4 cm above carina. Enteric catheter is identified, tip projecting at the gastroesophageal junction. Right internal jugular catheter tip  projects over the superior vena cava. Cardiac silhouette remains enlarged, with postsurgical changes from interval CABG. Lung volumes are diminished, with central pulmonary vascular congestion. Minimal left basilar atelectasis and trace left effusion. No pneumothorax. IMPRESSION: 1. Enteric catheter tip projecting at the gastroesophageal junction. 2. Remaining support devices as above. 3. Low lung volumes, with left basilar atelectasis and small left effusion. Electronically Signed   By: Ozell Daring M.D.   On: 01/08/2024 19:26   VAS US  DOPPLER PRE CABG Result Date: 01/07/2024 PREOPERATIVE VASCULAR EVALUATION Patient Name:  Timothy Holloway  Date of Exam:   01/07/2024 Medical Rec #: [id number]            Accession #:    [id number] Date of Birth: 06-24-1955             Patient Gender: M Patient Age:   10 years Exam Location:  [facility] Procedure:      VAS US  DOPPLER PRE CABG Referring Phys: HARRELL LIGHTFOOT --------------------------------------------------------------------------------  Indications:            Pre-CABG. Risk Factors:           Hypertension, hyperlipidemia, Diabetes, coronary artery                         disease, PAD. Other Factors:          Obesity, on HD. Vascular Interventions: 07/26/22- Right upper arm AVF creation.                         01/11/22 - Left arm AVF creation. Performing Technologist: Ricka Sturdivant-Jones RDMS, RVT  Examination Guidelines: A complete evaluation includes B-mode imaging, spectral Doppler, color Doppler, and power Doppler as needed of all accessible portions of each vessel. Bilateral testing is considered an integral part of a complete examination. Limited examinations for reoccurring indications may be performed as noted.  Right Carotid Findings: +----------+--------+--------+--------+--------+--------+           PSV cm/sEDV cm/sStenosisDescribeComments +----------+--------+--------+--------+--------+--------+ CCA Prox  60      20                                +----------+--------+--------+--------+--------+--------+ CCA Distal86      27                               +----------+--------+--------+--------+--------+--------+ ICA Prox  153     46      40-59%  calcific         +----------+--------+--------+--------+--------+--------+ ICA Mid   169     46      40-59%  calcific         +----------+--------+--------+--------+--------+--------+ ICA Distal100     29                               +----------+--------+--------+--------+--------+--------+ ECA       93      18                               +----------+--------+--------+--------+--------+--------+ +----------+--------+-------+----------------+------------+           PSV cm/sEDV cmsDescribe        Arm Pressure +----------+--------+-------+----------------+------------+ [id number]            Multiphasic, [id number]          +----------+--------+-------+----------------+------------+ +---------+--------+--+--------+--+---------+ VertebralPSV cm/s52EDV cm/s19Antegrade +---------+--------+--+--------+--+---------+ Left Carotid Findings: +----------+--------+--------+--------+----------------------+---------+           PSV cm/sEDV cm/sStenosisDescribe              Comments  +----------+--------+--------+--------+----------------------+---------+ CCA Prox  61      0                                               +----------+--------+--------+--------+----------------------+---------+  CCA Distal31      8                                               +----------+--------+--------+--------+----------------------+---------+ ICA Prox  358     126     80-99%  irregular and calcificShadowing +----------+--------+--------+--------+----------------------+---------+ ICA Mid   91      26                                              +----------+--------+--------+--------+----------------------+---------+ ICA Distal75      10                                               +----------+--------+--------+--------+----------------------+---------+ ECA       381     52      >50%                                    +----------+--------+--------+--------+----------------------+---------+ +----------+--------+--------+--------+------------+ SubclavianPSV cm/sEDV cm/sDescribeArm Pressure +----------+--------+--------+--------+------------+           97                                   +----------+--------+--------+--------+------------+ +---------+--------+--+--------+--+ VertebralPSV cm/s85EDV cm/s23 +---------+--------+--+--------+--+  ABI Findings: +------------------+-----+---------+----------------+ Rt Pressure (mmHg)IndexWaveform Comment          +------------------+-----+---------+----------------+ 176                    triphasic                 +------------------+-----+---------+----------------+                        biphasic non compressible +------------------+-----+---------+----------------+                        biphasic non compressible +------------------+-----+---------+----------------+ 84                0.48 Abnormal                  +------------------+-----+---------+----------------+ +------------------+-----+---------+----------------+ Lt Pressure (mmHg)IndexWaveform Comment          +------------------+-----+---------+----------------+                        triphasicNon compressible +------------------+-----+---------+----------------+                                 non compressible +------------------+-----+---------+----------------+                                 non compressible +------------------+-----+---------+----------------+ 90                0.51 Abnormal                  +------------------+-----+---------+----------------+ +-------+---------------+ ABI/TBIToday's ABI/TBI +-------+---------------+ Right  [place] / 0.48        +-------+---------------+  Left   [place] / 0.51       +-------+---------------+  Right Doppler Findings: +--------+--------+---------+ Site    PressureDoppler   +--------+--------+---------+ [id number]     triphasic +--------+--------+---------+ Radial          triphasic +--------+--------+---------+ Ulnar           triphasic +--------+--------+---------+  Left Doppler Findings: +--------+---------+----------------+ Site    Doppler  Comments         +--------+---------+----------------+ BrachialtriphasicNon compressible +--------+---------+----------------+ Radial  triphasic                 +--------+---------+----------------+ Ulnar   triphasic                 +--------+---------+----------------+   Summary: Right Carotid: Velocities in the right ICA are consistent with a 40-59%                stenosis. Left Carotid: Velocities in the left ICA are consistent with a 80-99% stenosis. Vertebrals:  Bilateral vertebral arteries demonstrate antegrade flow. Subclavians: Normal flow hemodynamics were seen in bilateral subclavian              arteries. Right ABI: Resting right ankle-brachial index indicates noncompressible right lower extremity arteries. The right toe-brachial index is abnormal. Left ABI: Resting left ankle-brachial index indicates noncompressible left lower extremity arteries. The left toe-brachial index is abnormal. Right Upper Extremity: Doppler waveform obliterate with right radial compression. Doppler waveforms decrease 50% with right ulnar compression. Left Upper Extremity: Doppler waveform obliterate with left radial compression. Doppler waveforms decrease 50% with left ulnar compression.  Electronically signed by Debby Robertson on 01/07/2024 at 8:40:42 PM.    Final     Medications:  sodium chloride      sodium chloride      sodium chloride      sodium chloride  100 mL/hr at 01/09/24 1100   albumin  human Stopped (01/09/24 0043)    ceFAZolin  (ANCEF ) IV Stopped  (01/08/24 2033)   lactated ringers      lactated ringers  20 mL/hr at 01/09/24 1100   norepinephrine  (LEVOPHED ) Adult infusion 2 mcg/min (01/09/24 1100)   vancomycin       [START ON 01/10/2024]  stroke: early stages of recovery book   Does not apply Once   acetaminophen   1,000 mg Oral Q6H   Or   acetaminophen  (TYLENOL ) oral liquid 160 mg/5 mL  1,000 mg Per Tube Q6H   aspirin  EC  325 mg Oral Daily   Or   aspirin   324 mg Per Tube Daily   atorvastatin   80 mg Oral Daily   bisacodyl   10 mg Oral Daily   Or   bisacodyl   10 mg Rectal Daily   Chlorhexidine  Gluconate Cloth  6 each Topical Daily   docusate sodium   200 mg Oral Daily   enoxaparin  (LOVENOX ) injection  30 mg Subcutaneous QHS   fenofibrate   54 mg Oral Daily   insulin  aspart  0-24 Units Subcutaneous Q4H   insulin  glargine  10 Units Subcutaneous Daily   metoCLOPramide  (REGLAN ) injection  10 mg Intravenous Q6H   metoprolol  tartrate  12.5 mg Oral BID   Or   metoprolol  tartrate  12.5 mg Per Tube BID   [START ON 01/10/2024] pantoprazole   40 mg Oral Daily   pantoprazole  (PROTONIX ) IV  40 mg Intravenous QHS   sodium chloride  flush  10-40 mL Intracatheter Q12H   sodium chloride  flush  3 mL Intravenous Q12H   sucroferric oxyhydroxide  500 mg Oral TID WC    Dialysis Orders: [facility] MWF 4:00 350/500 EDW 133.5 kg 2K/2.5 Ca TDC Heparin  2000 + 1400/hr  Calcitriol 1.25 three times per week Mircera 90 mcg q 2 wks (last 12/1)    Assessment/Plan: NSTEMI - LHC 12/5 with severe 3V CAD. EF 30-35%. S/p CABG x 3 on 12/8.  Stroke like symptoms. Neuro consulted. No acute CVA on Head CT. Advanced carotid disease on CTA.  ESRD - HD MWF. Plan for HD Wed, bedside in ICU  Access- Using [facility]. H/o failed AVF x 2  Hypertension - BP perimeters per PCCM. MAP goal >70 Volume - Optimize volume with HD. UF as able.  Anemia - Hgb 8.6. follow post op trend. ESA recently dosed as outpatient. No iron with high ferritin.  Transfuse prn for Hgb<7.  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 Continue home binder and VDRA. Nutrition - Renal diet with fluid restriction  DMT2 - Insulin  per primary   Maisie Ronnald Acosta PA-C [facility] 01/09/2024,2:08 PM

## 2024-01-09 NOTE — Inpatient Diabetes Management (Signed)
 Inpatient Diabetes Program Recommendations  AACE/ADA: New Consensus Statement on Inpatient Glycemic Control (2015)  Target Ranges:  Prepandial:   less than 140 mg/dL      Peak postprandial:   less than 180 mg/dL (1-2 hours)      Critically ill patients:  140 - 180 mg/dL   Lab Results  Component Value Date   GLUCAP 176 (H) 01/09/2024   HGBA1C 9.9 (H) 01/05/2024    Review of Glycemic Control  Latest Reference Range & Units 01/08/24 23:04 01/09/24 00:06 01/09/24 02:02 01/09/24 04:04 01/09/24 06:26 01/09/24 08:28  Glucose-Capillary 70 - 99 mg/dL 856 (H) 873 (H) 871 (H) 131 (H) 144 (H) 176 (H)   Diabetes history: DM 2 Outpatient Diabetes medications:  Lantus  50 units q HS, Humalog 10-20 units tid with meals  Current orders for Inpatient glycemic control:  Novolog  0-15 units tid with meals and HS Semglee  10 units daily  Inpatient Diabetes Program Recommendations:    Note patient transitioning off insulin  drip this AM. May need increased dose of basal insulin  based on home dose.  Will follow.   Thanks,  Randall Bullocks, RN, BC-ADM Inpatient Diabetes Coordinator Pager [phone]  (8a-5p)

## 2024-01-09 NOTE — Progress Notes (Signed)
 [address] [place] [employer] [address]             [phone]                1 Day Post-Op Procedure(s) (LRB): CORONARY ARTERY BYPASS GRAFTING (CABG) TIMES 3 USING LEFT INTERNAL MAMMARY ARTERY AND ENDOSCOPICALLY HARVESTED RIGHT GREATER SAPHENOUS VEIN (N/A) ECHOCARDIOGRAM, TRANSESOPHAGEAL (N/A)   Events: No events _______________________________________________________________ Vitals: BP (!) 152/49 (BP Location: Left Arm)   Pulse 80   Temp 98.2 F (36.8 C) (Oral)   Resp 11   Ht 6' (1.829 m)   Wt 135.3 kg   SpO2 97%   BMI 40.45 kg/m  Filed Weights   01/08/24 1125 01/08/24 1208 01/09/24 0600  Weight: 130.2 kg 133.8 kg 135.3 kg     - Neuro: alert NAD  - Cardiovascular: sinus   Drips: levo 1.   CVP:  [4 mmHg-21 mmHg] 8 mmHg CO:  [4.2 L/min] 4.2 L/min CI:  [3.3 L/min/m2] 3.3 L/min/m2  - Pulm: EWOB  ABG    Component Value Date/Time   PHART 7.337 (L) 01/08/2024 2222   PCO2ART 35.5 01/08/2024 2222   PO2ART 121 (H) 01/08/2024 2222   HCO3 19.0 (L) 01/08/2024 2222   TCO2 20 (L) 01/08/2024 2222   ACIDBASEDEF 6.0 (H) 01/08/2024 2222   O2SAT 68.6 01/09/2024 0412    - Abd: ND - Extremity: trace edema  .Intake/Output      12/08 0701 12/09 0700 12/09 0701 12/10 0700   P.O.  240   I.V. (mL/kg) 2117.2 (15.6) 34.3 (0.3)   Blood 482    NG/GT 80    IV Piggyback 1496.7    Total Intake(mL/kg) 4175.8 (30.9) 274.3 (2)   Urine (mL/kg/hr) 665 (0.2) 20 (0.1)   Other 5400    Blood 1100    Chest Tube 380 20   Total Output 7545 40   Net -3369.2 +234.3           _______________________________________________________________ Labs:    Latest Ref Rng & Units 01/09/2024    4:12 AM 01/09/2024   12:09 AM 01/08/2024   10:22 PM  CBC  WBC 4.0 - 10.5 K/uL 16.3  16.9    Hemoglobin 13.0 - 17.0 g/dL 8.6  8.5  8.2   Hematocrit 39.0 - 52.0 % 26.1  26.3  24.0   Platelets 150 - 400 K/uL 237  226        Latest Ref Rng & Units 01/09/2024    4:12 AM  01/09/2024   12:09 AM 01/08/2024   10:22 PM  CMP  Glucose 70 - 99 mg/dL 868  864    BUN 8 - 23 mg/dL 40  38    Creatinine 9.38 - 1.24 mg/dL 4.91  5.08    Sodium 864 - 145 mmol/L 134  134  137   Potassium 3.5 - 5.1 mmol/L 4.8  4.7  4.2   Chloride 98 - 111 mmol/L 100  99    CO2 22 - 32 mmol/L 15  27    Calcium  8.9 - 10.3 mg/dL 7.9  7.8      CXR: PV congestion  _______________________________________________________________  Assessment and Plan: POD 1 s/p CABG  Neuro: pain controlled CV: weaning levo.  SBP > 100.  On A/S.  Will start BB Pulm: IS, ambulation Renal: ESRD.  K stable.  Dialysis likely tomorrow GI: on diet Heme: stable ID: [email or web address] Endo: SSI Dispo: continue ICU care  Linnie [name] 01/09/2024 8:21 AM

## 2024-01-09 NOTE — Procedures (Addendum)
 Patient Name: Timothy Holloway  MRN: [id number]  Epilepsy Attending: Arlin MALVA Krebs  Referring Physician/Provider: Jenna Maude BRAVO, NP  Date: 01/09/2024 Duration: 22.36 mins  Patient history:  68 y.o. male with past medical history as above who is postoperative day 1 from a CABG, was noted to have speech difficulty and right-sided weakness with a last known well of 8:40 AM. EEG to evaluate for seizure  Level of alertness: Awake  AEDs during EEG study: None  Technical aspects: This EEG study was done with scalp electrodes positioned according to the 10-20 International system of electrode placement. Electrical activity was reviewed with band pass filter of 1-70Hz , sensitivity of 7 uV/mm, display speed of 6mm/sec with a 60Hz  notched filter applied as appropriate. EEG data were recorded continuously and digitally stored.  Video monitoring was available and reviewed as appropriate.  Description: EEG showed continuous generalized 3 to 6 Hz theta-delta slowing. Hyperventilation and photic stimulation were not performed.     ABNORMALITY - Continuous slow, generalized  IMPRESSION: This study is suggestive of generalized cerebral dysfunction (encephalopathy). No seizures or epileptiform discharges were seen throughout the recording.  [name]

## 2024-01-09 NOTE — Plan of Care (Signed)
 Problem: Education: Goal: Knowledge of General Education information will improve Description: Including pain rating scale, medication(s)/side effects and non-pharmacologic comfort measures Outcome: Progressing   Problem: Health Behavior/Discharge Planning: Goal: Ability to manage health-related needs will improve Outcome: Progressing   Problem: Clinical Measurements: Goal: Ability to maintain clinical measurements within normal limits will improve Outcome: Progressing Goal: Will remain free from infection Outcome: Progressing Goal: Diagnostic test results will improve Outcome: Progressing Goal: Respiratory complications will improve Outcome: Progressing Goal: Cardiovascular complication will be avoided Outcome: Progressing   Problem: Activity: Goal: Risk for activity intolerance will decrease Outcome: Progressing   Problem: Nutrition: Goal: Adequate nutrition will be maintained Outcome: Progressing   Problem: Coping: Goal: Level of anxiety will decrease Outcome: Progressing   Problem: Elimination: Goal: Will not experience complications related to bowel motility Outcome: Progressing Goal: Will not experience complications related to urinary retention Outcome: Progressing   Problem: Pain Managment: Goal: General experience of comfort will improve and/or be controlled Outcome: Progressing   Problem: Safety: Goal: Ability to remain free from injury will improve Outcome: Progressing   Problem: Skin Integrity: Goal: Risk for impaired skin integrity will decrease Outcome: Progressing   Problem: Education: Goal: Ability to describe self-care measures that may prevent or decrease complications (Diabetes Survival Skills Education) will improve Outcome: Progressing Goal: Individualized Educational Video(s) Outcome: Progressing   Problem: Coping: Goal: Ability to adjust to condition or change in health will improve Outcome: Progressing   Problem: Fluid  Volume: Goal: Ability to maintain a balanced intake and output will improve Outcome: Progressing   Problem: Health Behavior/Discharge Planning: Goal: Ability to identify and utilize available resources and services will improve Outcome: Progressing Goal: Ability to manage health-related needs will improve Outcome: Progressing   Problem: Metabolic: Goal: Ability to maintain appropriate glucose levels will improve Outcome: Progressing   Problem: Nutritional: Goal: Maintenance of adequate nutrition will improve Outcome: Progressing Goal: Progress toward achieving an optimal weight will improve Outcome: Progressing   Problem: Skin Integrity: Goal: Risk for impaired skin integrity will decrease Outcome: Progressing   Problem: Tissue Perfusion: Goal: Adequacy of tissue perfusion will improve Outcome: Progressing   Problem: Education: Goal: Understanding of CV disease, CV risk reduction, and recovery process will improve Outcome: Progressing Goal: Individualized Educational Video(s) Outcome: Progressing   Problem: Activity: Goal: Ability to return to baseline activity level will improve Outcome: Progressing   Problem: Cardiovascular: Goal: Ability to achieve and maintain adequate cardiovascular perfusion will improve Outcome: Progressing Goal: Vascular access site(s) Level 0-1 will be maintained Outcome: Progressing   Problem: Health Behavior/Discharge Planning: Goal: Ability to safely manage health-related needs after discharge will improve Outcome: Progressing   Problem: Education: Goal: Will demonstrate proper wound care and an understanding of methods to prevent future damage Outcome: Progressing Goal: Knowledge of disease or condition will improve Outcome: Progressing Goal: Knowledge of the prescribed therapeutic regimen will improve Outcome: Progressing Goal: Individualized Educational Video(s) Outcome: Progressing   Problem: Activity: Goal: Risk for activity  intolerance will decrease Outcome: Progressing   Problem: Cardiac: Goal: Will achieve and/or maintain hemodynamic stability Outcome: Progressing   Problem: Clinical Measurements: Goal: Postoperative complications will be avoided or minimized Outcome: Progressing   Problem: Respiratory: Goal: Respiratory status will improve Outcome: Progressing   Problem: Skin Integrity: Goal: Wound healing without signs and symptoms of infection Outcome: Progressing Goal: Risk for impaired skin integrity will decrease Outcome: Progressing   Problem: Urinary Elimination: Goal: Ability to achieve and maintain adequate renal perfusion and functioning will improve Outcome: Progressing

## 2024-01-09 NOTE — TOC Initial Note (Signed)
 Transition of Care [facility]) - Initial/Assessment Note    Patient Details  Name: Timothy Holloway MRN: [id number] Date of Birth: 12/01/1955  [facility] CM/SW Contact:    Sudie Erminio Deems, RN Phone Number: 01/09/2024, 2:46 PM  Clinical Narrative:  Patient presented for chest pain-POD-1 CABG. Hx ESRD- HD session MWF. PTA patient was independent from home with spouse. Patient does not use any DME in the home. Patient  has a PCP and has no issues with transportation. Patient will benefit from PT/OT consult as he progresses. Per staff RN, patient has been having some stroke like  symptoms. ICM will continue to follow for additional disposition needs.              Expected Discharge Plan: Home w Home Health Services Barriers to Discharge: Continued Medical Work up  Expected Discharge Plan and Services   Discharge Planning Services: CM Consult Post Acute Care Choice: Home Health Living arrangements for the past 2 months: Single Family Home   Prior Living Arrangements/Services Living arrangements for the past 2 months: Single Family Home Lives with:: Spouse Patient language and need for interpreter reviewed:: Yes Do you feel safe going back to the place where you live?: Yes      Need for Family Participation in Patient Care: Yes (Comment)     Criminal Activity/Legal Involvement Pertinent to Current Situation/Hospitalization: No - Comment as needed  Activities of Daily Living   ADL Screening (condition at time of admission) Independently performs ADLs?: Yes (appropriate for developmental age) (need some assistance due to current not feelin gwell) Is the patient deaf or have difficulty hearing?: No Does the patient have difficulty seeing, even when wearing glasses/contacts?: No Does the patient have difficulty concentrating, remembering, or making decisions?: No  Permission Sought/Granted Permission sought to share information with : Family Supports, Case  Manager                Emotional Assessment Appearance:: Appears stated age Attitude/Demeanor/Rapport: Unable to Assess Affect (typically observed): Unable to Assess   Alcohol / Substance Use: Not Applicable Psych Involvement: No (comment)  Admission diagnosis:  NSTEMI (non-ST elevated myocardial infarction) [facility]) [I21.4] Patient Active Problem List   Diagnosis Date Noted   S/P CABG x 3 01/09/2024   On mechanically assisted ventilation (HCC) 01/08/2024   Acute HFrEF (heart failure with reduced ejection fraction) (HCC) 01/08/2024   NSTEMI (non-ST elevated myocardial infarction) (HCC) 01/05/2024   ESRD on hemodialysis (HCC) 01/05/2024   Hyperglycemia 01/05/2024   Chest pain 01/05/2024   Pulmonary edema 01/05/2024   Acute renal failure superimposed on stage 4 chronic kidney disease (HCC) 07/21/2021   Anemia due to chronic kidney disease 07/21/2021   Sinusitis 07/21/2021   AKI (acute kidney injury) 09/11/2020   Morbid obesity (HCC) 08/06/2012   HLD (hyperlipidemia) 08/06/2012   HTN (hypertension) 08/06/2012   Diabetic retinopathy (HCC) 08/06/2012   Erectile dysfunction 08/06/2012   Diabetes mellitus (HCC) 08/06/2012   Insomnia 08/06/2012   Muscle spasm of back 08/06/2012   Chronic back pain 08/06/2012   CKD (chronic kidney disease) 08/06/2012   PCP:  Baird Comer GAILS, NP Pharmacy:   [facility] [phone] - [place], [place] - [address] [place] [address] Phone: [phone] Fax: [phone]     Social Drivers of Health (SDOH) Social History: SDOH Screenings   Food Insecurity: No Food Insecurity (01/06/2024)  Housing: Low Risk  (01/05/2024)  Transportation Needs: No Transportation Needs (01/05/2024)  Utilities: Not At Risk (  01/05/2024)  Financial Resource Strain: Low Risk (09/14/2023)   Received from [facility]  Social Connections: Unknown (01/06/2024)  Tobacco Use: Unknown (01/08/2024)   SDOH Interventions:     Readmission  Risk Interventions     No data to display

## 2024-01-09 NOTE — Code Documentation (Signed)
 Stroke Response Nurse Documentation Code Documentation  Timothy Holloway is a 68 y.o. male admitted to [facility]  on 12/5 for CABG with past medical hx of T2DM, ERSD on dialysis, HTN, . On No antithrombotic. Code stroke was activated by [facility] RN.   Patient on 2H where he was LKW at 22 and now complaining of aphasia and R sided weakness.   Stroke team at the bedside after patient activation. Patient to CT with team. NIHSS 1, see documentation for details and code stroke times. Patient with dysarthria  on exam. The following imaging was completed:  CT Head and CTA. Patient is not a candidate for IV Thrombolytic due to recent CABG. Patient is not a candidate for IR due to no LVO.   Care/Plan: q2 NIHSS and swallow screen before any PO intake. Routine MRI.    Bedside handoff with RN Laneta.    Lauraine LITTIE Searle  Stroke Response RN

## 2024-01-09 NOTE — Progress Notes (Addendum)
 At 0905 called to room for code stroke Pt had been alert. Oriented no focal def on my exam earlier.  Nursing notified us  of sudden change that included New right sided weakness Expressive aphasia and worsening slurred sp   We increased NE for MAP goal of >70 He was transferred for emergent CT Pre-lim no ICH. Neuro met us  on arrival. After CT while waiting to do CTA on exam his sp is now more clear. He is oriented. Still has some right sided weakness w. Asterixis but improved from earlier Plan Will cont MAP goal >70 Await formal CT A Will need nonurgent MRI after pacer wires out    I personally  spent 31 minutes  on this patient which included: review of medical records, nursing notes, progress notes, evaluation, interpretation of lab data and diagnostic studies, taking independent history, performing exam, documenting plan, ordering diagnostics and interventions for the following critical care issues: Stroke with the following interventions which included: evaluation and management of acute neurological insult

## 2024-01-09 NOTE — Plan of Care (Signed)
 Called this afternoon by the care team for patient experiencing right facial droop, right arm and leg weakness with some amount of hypotension. As his blood pressure came up, he returned back to baseline. I recommend avoiding hypotension and keeping maximum amount of permissive hypertension that his current cardiac status allows. Stroke team to continue to follow

## 2024-01-09 NOTE — Progress Notes (Signed)
 STAT EEG complete - results pending. ? ?

## 2024-01-09 NOTE — Discharge Summary (Signed)
 [address] [place] [address]             [phone]        Physician Discharge Summary  Patient ID: Timothy Holloway MRN: [id number] DOB/AGE: 1955-06-16 68 y.o.  Admit date: 01/05/2024 Discharge date: 01/09/2024  Admission Diagnoses:  Patient Active Problem List   Diagnosis Date Noted   Acute HFrEF (heart failure with reduced ejection fraction) (HCC) 01/08/2024   NSTEMI (non-ST elevated myocardial infarction) (HCC) 01/05/2024   ESRD on hemodialysis (HCC) 01/05/2024   Hyperglycemia 01/05/2024   Chest pain 01/05/2024   Pulmonary edema 01/05/2024   Acute renal failure superimposed on stage 4 chronic kidney disease (HCC) 07/21/2021   Anemia due to chronic kidney disease 07/21/2021   Sinusitis 07/21/2021   AKI (acute kidney injury) 09/11/2020   Morbid obesity (HCC) 08/06/2012   HLD (hyperlipidemia) 08/06/2012   HTN (hypertension) 08/06/2012   Diabetic retinopathy (HCC) 08/06/2012   Erectile dysfunction 08/06/2012   Diabetes mellitus (HCC) 08/06/2012   Insomnia 08/06/2012   Muscle spasm of back 08/06/2012   Chronic back pain 08/06/2012   CKD (chronic kidney disease) 08/06/2012   Discharge Diagnoses:  Patient Active Problem List   Diagnosis Date Noted   S/P CABG x 3 01/09/2024   On mechanically assisted ventilation (HCC) 01/08/2024   Acute HFrEF (heart failure with reduced ejection fraction) (HCC) 01/08/2024   NSTEMI (non-ST elevated myocardial infarction) (HCC) 01/05/2024   ESRD on hemodialysis (HCC) 01/05/2024   Hyperglycemia 01/05/2024   Chest pain 01/05/2024   Pulmonary edema 01/05/2024   Acute renal failure superimposed on stage 4 chronic kidney disease (HCC) 07/21/2021   Anemia due to chronic kidney disease 07/21/2021   Sinusitis 07/21/2021   AKI (acute kidney injury) 09/11/2020   Morbid obesity (HCC) 08/06/2012   HLD (hyperlipidemia) 08/06/2012   HTN (hypertension) 08/06/2012   Diabetic retinopathy (HCC) 08/06/2012    Erectile dysfunction 08/06/2012   Diabetes mellitus (HCC) 08/06/2012   Insomnia 08/06/2012   Muscle spasm of back 08/06/2012   Chronic back pain 08/06/2012   CKD (chronic kidney disease) 08/06/2012   Discharged Condition: {condition:18240}  History of Present Illness:  Timothy Holloway is a 68 year old gentleman with past history of end-stage renal disease on hemodialysis for the past year and a half, type 2 diabetes mellitus, dyslipidemia, and obesity.  He developed chest pain and shortness of breath shortly after arriving for his dialysis treatment earlier today.  He was taken to the emergency room at [facility] by way of EMS for further evaluation.  He described having similar episodes several times over the past few days, each lasting about an hour and resolving without any particular intervention.  In the emergency room, his EKG showed normal sinus rhythm with first-degree AV block.  Chest x-ray was notable for cardiomegaly with interstitial opacities consistent with pulmonary edema.  Initial high-sensitivity troponin was 1100 and later rose to 1300.  Having ruled in for non-ST elevation myocardial infarction, he was started on aspirin , atorvastatin , and a heparin  infusion.  His chest pain resolved. He was transferred to [facility] Course:  Cardiology consultation was provided by Dr. Deward Gull.  She recommended proceeding with left heart catheterization for further evaluation.  The study was conducted by Dr. Peter Jordan earlier today and shows severe three-vessel coronary artery disease with high-grade proximal stenoses in the LAD, circumflex, and RCA.  LVEDP was evaded at 19  and left ventricular ejection fraction was estimated at 30 to 35%. CT surgery has been asked to evaluate Timothy Holloway for consideration of coronary bypass grafting.  The patient was evaluated by Dr. Shyrl who was in agreement coronary bypass grafting would be indicated. The risks  and benefits of the procedure were explained to the patient and he was agreeable to proceed.  He was taken to the Operating room on 01/08/2024 and underwent Coronary Bypass Grafting x 3 utilizing LIMA to LAD, SVG to OM, and SVG to Distal RCA.  He tolerated the procedure without difficulty and was taken to the SICU in stable condition.  The patient was extubated the evening of surgery.  The patient required support with levophed  which was weaned as tolerated.  Nephrology followed patient throughout his stay and dialysis was initiated on ***.   Consults: {consultation:18241}  Significant Diagnostic Studies: cardiac graphics:   Angiography:    Ost LAD to Prox LAD lesion is 95% stenosed.   Mid Cx lesion is 50% stenosed.   Prox Cx lesion is 90% stenosed.   Prox RCA lesion is 70% stenosed.   Dist Cx lesion is 50% stenosed.   There is moderate to severe left ventricular systolic dysfunction.   LV end diastolic pressure is mildly elevated.   Severe 3 vessel obstructive CAD Mod- severe LV dysfunction. EF estimated at 30-35% Mildly elevated LVEDP 19 mm Hg   Plan: check Echo. CT surgery consult.   Treatments: {Tx:18249}   Discharge Exam: Blood pressure (!) 152/49, pulse 80, temperature 98.2 F (36.8 C), temperature source Oral, resp. rate 11, height 6' (1.829 m), weight 135.3 kg, SpO2 97%. {physical [id number]   Discharge Medications:  The patient has been discharged on:   1.Beta Blocker:  Yes [   ]                              No   [   ]                              If No, reason:  2.Ace Inhibitor/ARB: Yes [   ]                                     No  [    ]                                     If No, reason:  3.Statin:   Yes [   ]                  No  [   ]                  If No, reason:  4.Ecasa:  Yes  [   ]                  No   [   ]                  If No, reason:  Patient had ACS upon admission:  Plavix /P2Y12 inhibitor: Yes [   ]  No  [   ]     Discharge Instructions     AMB Referral to Cardiac Rehabilitation - Phase II   Complete by: As directed    Diagnosis: CABG   CABG X ___: 3   After initial evaluation and assessments completed: Virtual Based Care may be provided alone or in conjunction with Phase 2 Cardiac Rehab based on patient barriers.: Yes   Intensive Cardiac Rehabilitation (ICR) [facility] location only OR Traditional Cardiac Rehabilitation (TCR) *If criteria for ICR are not met will enroll in TCR ([facility] only): Yes   AMB referral to Phase II Cardiac Rehabilitation   Complete by: As directed    Diagnosis: NSTEMI   After initial evaluation and assessments completed: Virtual Based Care may be provided alone or in conjunction with Phase 2 Cardiac Rehab based on patient barriers.: Yes   Intensive Cardiac Rehabilitation (ICR) [facility] location only OR Traditional Cardiac Rehabilitation (TCR) *If criteria for ICR are not met will enroll in TCR ([facility] only): Yes      Allergies as of 01/09/2024       Reactions   Latex Hives   Lodine [etodolac] Nausea And Vomiting     Med Rec must be completed prior to using this SMART[employer]***        Signed:  Rocky Shad, PA-C  01/09/2024, 8:15 AM

## 2024-01-09 NOTE — Consult Note (Signed)
 NEUROLOGY CONSULT NOTE   Date of service: January 09, 2024 Patient Name: Timothy Holloway MRN:  [id number] DOB:  [birth year] Chief Complaint: Speech difficulty, right-sided weakness Requesting Provider: Shyrl Linnie KIDD, MD  History of Present Illness  Timothy Holloway is a 68 y.o. male with hx of CAD status post CABG postop day 1, ESRD on hemodialysis, hypertension, hyperlipidemia, diabetes, who is recovering in the cardiac ICU after his CABG, was noted to be having difficulty with words this morning.  Last known well was 8:40 AM when the nurse checked on him.  Upon repeat assessment, he was unable to communicate with her regarding the number on the pain scale as she had asked.  He was also having trouble with his right grip strength and the right arm felt weaker than the left.  Prior to that he had been having full conversations with the team.  Code stroke was activated.  He was evaluated emergently in the CT scanner.  Noncontrasted head CT was unremarkable for acute process.  Of note he was on pressors to treat his hypotension in the ICU  Clinical exam documented below.  At the time that he was evaluated right after the CT scan, he had started to converse again and had no drift on exam. CT angiography head and neck done stat to rule out an LVO.  LKW: 8:40 AM Modified rankin score: 0-Completely asymptomatic and back to baseline post- stroke IV Thrombolysis: No-low NIH stroke scale, recent CABG EVT: No ELVO   NIHSS components Score: Comment  1a Level of Conscious 0[x]  1[]  2[]  3[]      1b LOC Questions 0[x]  1[]  2[]       1c LOC Commands 0[x]  1[]  2[]       2 Best Gaze 0[x]  1[]  2[]       3 Visual 0[x]  1[]  2[]  3[]      4 Facial Palsy 0[x]  1[]  2[]  3[]      5a Motor Arm - left 0[x]  1[]  2[]  3[]  4[]  UN[]    5b Motor Arm - Right 0[x]  1[]  2[]  3[]  4[]  UN[]    6a Motor Leg - Left 0[x]  1[]  2[]  3[]  4[]  UN[]    6b Motor Leg - Right 0[x]  1[]  2[]  3[]  4[]  UN[]    7 Limb Ataxia 0[x]  1[]  2[]  UN[]      8  Sensory 0[x]  1[]  2[]  UN[]      9 Best Language 0[x]  1[]  2[]  3[]      10 Dysarthria 0[]  1[x]  2[]  UN[]      11 Extinct. and Inattention 0[x]  1[]  2[]       TOTAL: 1      ROS  Comprehensive ROS performed and pertinent positives documented in HPI    Past History   Past Medical History:  Diagnosis Date   Anemia    Cataract    Chronic kidney disease (CKD), stage III (moderate) (HCC)    dr Faythe   Depression    Diabetes mellitus without complication (HCC)    DVT (deep venous thrombosis) (HCC)    as a child   ESRD on dialysis [facility])    M-W-F   Hyperlipidemia    Hypertension    Impotence    Insomnia    Obesity    Steal syndrome as complication of dialysis access     Past Surgical History:  Procedure Laterality Date   A/V FISTULAGRAM Right 10/11/2022   Procedure: A/V Fistulagram;  Surgeon: Serene Gaile ORN, MD;  Location: [facility] INVASIVE CV LAB;  Service: Cardiovascular;  Laterality: Right;   AV FISTULA PLACEMENT  Left 01/11/2022   Procedure: LEFT ARM ARTERIOVENOUS (AV) FISTULA CREATION;  Surgeon: Lanis Fonda BRAVO, MD;  Location: [facility] OR;  Service: Vascular;  Laterality: Left;  PERIPHERAL NERVE BLOCK   AV FISTULA PLACEMENT Right 07/26/2022   Procedure: RIGHT UPPER EXTREMITY ARTERIOVENOUS (AV) FISTULA CREATION;  Surgeon: Oris Krystal FALCON, MD;  Location: [facility] ORS;  Service: Vascular;  Laterality: Right;   DIALYSIS/PERMA CATHETER INSERTION Right 10/23/2023   Procedure: DIALYSIS/PERMA CATHETER INSERTION;  Surgeon: Magda Debby SAILOR, MD;  Location: [facility] PV LAB;  Service: Cardiovascular;  Laterality: Right;   INSERTION OF DIALYSIS CATHETER Right 06/16/2022   Procedure: INSERTION OF RIGHT iNTERNAL Jugular TUNNELED DIALYSIS CATHETER;  Surgeon: Serene Gaile ORN, MD;  Location: [facility] OR;  Service: Vascular;  Laterality: Right;   KNEE SURGERY Left 01-31-2002   LEFT HEART CATH AND CORONARY ANGIOGRAPHY N/A 01/05/2024   Procedure: LEFT HEART CATH AND CORONARY ANGIOGRAPHY;  Surgeon: Jordan, Peter M, MD;  Location: [facility]  INVASIVE CV LAB;  Service: Cardiovascular;  Laterality: N/A;   LIGATION OF ARTERIOVENOUS  FISTULA Left 06/16/2022   Procedure: LEFT ARM FISTULA LIGATION;  Surgeon: Serene Gaile ORN, MD;  Location: [facility] OR;  Service: Vascular;  Laterality: Left;   LIGATION OF ARTERIOVENOUS  FISTULA Right 11/08/2022   Procedure: LIGATION of RIGHT RADIOCEPHALIC FISTULA;  Surgeon: Sheree Penne Bruckner, MD;  Location: [facility] OR;  Service: Vascular;  Laterality: Right;   SPINE SURGERY     fusion of L4-L5   UPPER EXTREMITY ANGIOGRAPHY Right 11/03/2022   Procedure: Upper Extremity Angiography;  Surgeon: Gretta Bruckner PARAS, MD;  Location: [facility] INVASIVE CV LAB;  Service: Cardiovascular;  Laterality: Right;    Family History: Family History  Problem Relation Age of Onset   Diabetes Mother    Cancer Father        lymphoma    Social History  reports that he has never smoked. He has never been exposed to tobacco smoke. He does not have any smokeless tobacco history on file. He reports that he does not drink alcohol and does not use drugs.  Allergies  Allergen Reactions   Latex Hives   Lodine [Etodolac] Nausea And Vomiting    Medications   Current Facility-Administered Medications:    0.45 % sodium chloride  infusion, , Intravenous, Continuous PRN, Barrett, Erin R, PA-C   0.9 %  sodium chloride  infusion, 250 mL, Intravenous, Continuous, Barrett, Erin R, PA-C   0.9 %  sodium chloride  infusion, , Intravenous, Continuous, Barrett, Erin R, PA-C   acetaminophen  (TYLENOL ) tablet 1,000 mg, 1,000 mg, Oral, Q6H, 1,000 mg at 01/09/24 0540 **OR** acetaminophen  (TYLENOL ) 160 MG/5ML solution 1,000 mg, 1,000 mg, Per Tube, Q6H, Barrett, Erin R, PA-C   albumin  human 5 % solution 12.5 g, 250 mL, Intravenous, Q15 min PRN, Barrett, Erin R, PA-C, Stopped at 01/09/24 0043   albuterol  (PROVENTIL ) (2.5 MG/3ML) 0.083% nebulizer solution 2.5 mg, 2.5 mg, Nebulization, Q4H PRN, Barrett, Erin R, PA-C   aspirin  EC tablet 325 mg, 325 mg, Oral,  Daily **OR** aspirin  chewable tablet 324 mg, 324 mg, Per Tube, Daily, Barrett, Erin R, PA-C   atorvastatin  (LIPITOR) tablet 80 mg, 80 mg, Oral, Daily, Barrett, Erin R, PA-C, 80 mg at 01/07/24 0906   bisacodyl  (DULCOLAX) EC tablet 10 mg, 10 mg, Oral, Daily **OR** bisacodyl  (DULCOLAX) suppository 10 mg, 10 mg, Rectal, Daily, Barrett, Erin R, PA-C   ceFAZolin  (ANCEF ) IVPB 2g/100 mL premix, 2 g, Intravenous, Q24H, Barrett, Erin R, PA-C, Stopped at 01/08/24 2033   Chlorhexidine  Gluconate Cloth 2 %  PADS 6 each, 6 each, Topical, Daily, Lightfoot, Harrell O, MD   dextrose  50 % solution 0-50 mL, 0-50 mL, Intravenous, PRN, Barrett, Erin R, PA-C   docusate sodium  (COLACE) capsule 200 mg, 200 mg, Oral, Daily, Barrett, Erin R, PA-C   enoxaparin  (LOVENOX ) injection 30 mg, 30 mg, Subcutaneous, QHS, Lightfoot, Harrell O, MD   fenofibrate  tablet 54 mg, 54 mg, Oral, Daily, Barrett, Erin R, PA-C, 54 mg at 01/07/24 0907   fentaNYL  (SUBLIMAZE ) injection 25-50 mcg, 25-50 mcg, Intravenous, Q2H PRN, Gretta Leita SQUIBB, DO, 50 mcg at 01/08/24 2126   CBG monitoring, , , Q4H **AND** insulin  aspart (novoLOG ) injection 0-24 Units, 0-24 Units, Subcutaneous, Q4H, Babcock, Peter E, NP   insulin  glargine (LANTUS ) injection 10 Units, 10 Units, Subcutaneous, Daily, Lightfoot, Harrell O, MD   lactated ringers  infusion, , Intravenous, Continuous, Barrett, Erin R, PA-C   lactated ringers  infusion, , Intravenous, Continuous, Barrett, Erin R, PA-C, Last Rate: 20 mL/hr at 01/09/24 0800, Infusion Verify at 01/09/24 0800   metoCLOPramide  (REGLAN ) injection 10 mg, 10 mg, Intravenous, Q6H, Barrett, Erin R, PA-C, 10 mg at 01/09/24 0539   metoprolol  tartrate (LOPRESSOR ) tablet 12.5 mg, 12.5 mg, Oral, BID **OR** metoprolol  tartrate (LOPRESSOR ) 25 mg/10 mL oral suspension 12.5 mg, 12.5 mg, Per Tube, BID, Barrett, Erin R, PA-C   metoprolol  tartrate (LOPRESSOR ) injection 2.5-5 mg, 2.5-5 mg, Intravenous, Q2H PRN, Barrett, Erin R, PA-C   midazolam  PF  (VERSED ) injection 2 mg, 2 mg, Intravenous, Q1H PRN, Barrett, Erin R, PA-C, 2 mg at 01/08/24 1850   norepinephrine  (LEVOPHED ) 4mg  in (0.016 mg/mL) premix infusion, 0-40 mcg/min, Intravenous, Titrated, Jenna Maude BRAVO, NP, Last Rate: 7.5 mL/hr at 01/09/24 0800, 2 mcg/min at 01/09/24 0800   ondansetron  (ZOFRAN ) injection 4 mg, 4 mg, Intravenous, Q6H PRN, Barrett, Erin R, PA-C   oxyCODONE  (Oxy IR/ROXICODONE ) immediate release tablet 5-10 mg, 5-10 mg, Oral, Q3H PRN, Barrett, Erin R, PA-C, 10 mg at 01/09/24 0426   [START ON 01/10/2024] pantoprazole  (PROTONIX ) EC tablet 40 mg, 40 mg, Oral, Daily, Barrett, Erin R, PA-C   pantoprazole  (PROTONIX ) injection 40 mg, 40 mg, Intravenous, QHS, Barrett, Erin R, PA-C, 40 mg at 01/08/24 2127   sodium chloride  flush (NS) 0.9 % injection 10-40 mL, 10-40 mL, Intracatheter, Q12H, Lightfoot, Harrell O, MD   sodium chloride  flush (NS) 0.9 % injection 10-40 mL, 10-40 mL, Intracatheter, PRN, Lightfoot, Harrell O, MD   sodium chloride  flush (NS) 0.9 % injection 3 mL, 3 mL, Intravenous, Q12H, Barrett, Erin R, PA-C   sodium chloride  flush (NS) 0.9 % injection 3 mL, 3 mL, Intravenous, PRN, Barrett, Erin R, PA-C   sucroferric oxyhydroxide (VELPHORO ) chewable tablet 500 mg, 500 mg, Oral, TID WC, Barrett, Erin R, PA-C, 500 mg at 01/07/24 1657   traMADol  (ULTRAM ) tablet 50 mg, 50 mg, Per Tube, Q8H PRN, Lightfoot, Harrell O, MD   vancomycin  (VANCOCIN ) IVPB 1000 mg/200 mL premix, 1,000 mg, Intravenous, Once, Ghimire, Renato, MD   zolpidem  (AMBIEN ) tablet 5 mg, 5 mg, Oral, QHS PRN, Barrett, Erin R, PA-C, 5 mg at 01/07/24 2206  Vitals   Vitals:   01/09/24 0655 01/09/24 0700 01/09/24 0734 01/09/24 0800  BP:      Pulse: 80 80 80 80  Resp: 13 12 11  (!) 22  Temp:   98.2 F (36.8 C)   TempSrc:   Oral   SpO2: 98% 97% 97% 95%  Weight:      Height:        Body mass index  is 40.45 kg/m.   Physical Exam   General: Initially appeared to be somewhat drowsy but was then awake  and cooperative HEENT: Normocephalic atraumatic Lungs: Clear Cardiovascular: Regular rhythm Neurological exam Awake alert oriented x 3 Mildly dysarthric speech No evidence of aphasia Cranial nerves II to XII appear intact Motor examination with no drift although he has asterixis more prominent on the right than on the left. Sensation intact to light touch Coordination examination with no dysmetria  Labs/Imaging/Neurodiagnostic studies   CBC:  Recent Labs  Lab 24-Jan-2024 0009 Jan 24, 2024 0412  WBC 16.9* 16.3*  HGB 8.5* 8.6*  HCT 26.3* 26.1*  MCV 95.6 96.3  PLT 226 237   Basic Metabolic Panel:  Lab Results  Component Value Date   NA 134 (L) 24-Jan-2024   K 4.8 24-Jan-2024   CO2 15 (L) Jan 24, 2024   GLUCOSE 131 (H) 24-Jan-2024   BUN 40 (H) 2024/01/24   CREATININE 5.08 (H) 01-24-2024   CALCIUM  7.9 (L) 01-24-2024   GFRNONAA 12 (L) 01/24/24   GFRAA 64 11/09/2012   Lipid Panel:  Lab Results  Component Value Date   LDLCALC 33 01/06/2024   HgbA1c:  Lab Results  Component Value Date   HGBA1C 9.9 (H) 01/05/2024   INR  Lab Results  Component Value Date   INR 1.3 (H) 01/08/2024   APTT  Lab Results  Component Value Date   APTT 45 (H) 01/08/2024    CT Head without contrast(Personally reviewed): No acute findings  CT angio Head and Neck with contrast(Personally reviewed): No ELVO.  Left ICA origin 80% stenosis.  2D echo 01/05/2024 extremely limited-hypokinesis of the distal anteroseptal wall and apex with overall mild to mod.  LV dysfunction.  LVEF 40 to 45%.  Left atrium size normal.  ASSESSMENT   Timothy Holloway is a 67 y.o. male with past medical history as above who is postoperative day 1 from a CABG, was noted to have speech difficulty and right-sided weakness with a last known well of 8:40 AM. He was evaluated emergently in the CT scanner where he quickly was coming to and did have mild dysarthria but no other gross abnormalities on the exam. His renal  function is impaired and he had asterixis on exam. Stat CT head unremarkable for acute process CTA head and neck with no ELVO but left ICA has 80% stenosis at the origin.  Stroke/transient ischemic attack could certainly be a possibility, especially given left ICA stenosis.  Other differentials would include toxic metabolic encephalopathy.  An MRI would be helpful to evaluate for any evidence of embolic, either atheroembolic or cardioembolic, strokes once he is able to get it after the sternal wires are removed.  Impression: Evaluate for TIA versus stroke. Evaluate for toxic metabolic encephalopathy   RECOMMENDATIONS  Frequent neurochecks every 2 hours. Telemetry MRI of the brain when able to Correction of toxic metabolic derangements per primary team as you are Will place stroke orderset to get the risk factor work up. Continue aspirin  Check A1c and LDL Statin for goal LDL less than 70 Goal A1c less than 7 Medical management of diabetes and hyperlipidemia per primary team Therapy assessments N.p.o. until cleared by bedside or formal swallow evaluation Stroke team to follow  Plan discussed with Jeralyn Banner, NP from the PCCM team ______________________________________________________________________    Signed, Eligio Lav, MD [facility]   CRITICAL CARE ATTESTATION Performed by: Eligio Lav, MD Total critical care time: 45 minutes Critical care time was exclusive of separately billable procedures and  treating other patients and/or supervising APPs/Residents/Students Critical care was necessary to treat or prevent imminent or life-threatening deterioration. This patient is critically ill and at significant risk for neurological worsening and/or death and care requires constant monitoring. Critical care was time spent personally by me on the following activities: development of treatment plan with patient and/or surrogate as well as nursing, discussions with  consultants, evaluation of patient's response to treatment, examination of patient, obtaining history from patient or surrogate, ordering and performing treatments and interventions, ordering and review of laboratory studies, ordering and review of radiographic studies, pulse oximetry, re-evaluation of patient's condition, participation in multidisciplinary rounds and medical decision making of high complexity in the care of this patient.

## 2024-01-10 ENCOUNTER — Inpatient Hospital Stay (HOSPITAL_COMMUNITY)

## 2024-01-10 ENCOUNTER — Encounter (HOSPITAL_COMMUNITY)
Admission: EM | Disposition: A | Discharge status: Rehabilitation Facility | Payer: Self-pay | Source: Home / Self Care | Attending: Thoracic Surgery (Cardiothoracic Vascular Surgery)

## 2024-01-10 DIAGNOSIS — Z992 Dependence on renal dialysis: Secondary | ICD-10-CM | POA: Diagnosis not present

## 2024-01-10 DIAGNOSIS — I6522 Occlusion and stenosis of left carotid artery: Secondary | ICD-10-CM | POA: Diagnosis not present

## 2024-01-10 DIAGNOSIS — E1122 Type 2 diabetes mellitus with diabetic chronic kidney disease: Secondary | ICD-10-CM | POA: Diagnosis not present

## 2024-01-10 DIAGNOSIS — E785 Hyperlipidemia, unspecified: Secondary | ICD-10-CM | POA: Diagnosis not present

## 2024-01-10 DIAGNOSIS — N186 End stage renal disease: Secondary | ICD-10-CM | POA: Diagnosis not present

## 2024-01-10 DIAGNOSIS — I214 Non-ST elevation (NSTEMI) myocardial infarction: Secondary | ICD-10-CM | POA: Diagnosis not present

## 2024-01-10 DIAGNOSIS — E1159 Type 2 diabetes mellitus with other circulatory complications: Secondary | ICD-10-CM

## 2024-01-10 DIAGNOSIS — Z951 Presence of aortocoronary bypass graft: Secondary | ICD-10-CM | POA: Diagnosis not present

## 2024-01-10 HISTORY — PX: TRANSCAROTID ARTERY REVASCULARIZATIONÂ: SHX6778

## 2024-01-10 LAB — CBC
HCT: 26.5 % — ABNORMAL LOW (ref 39.0–52.0)
Hemoglobin: 8.4 g/dL — ABNORMAL LOW (ref 13.0–17.0)
MCH: 31.1 pg (ref 26.0–34.0)
MCHC: 31.7 g/dL (ref 30.0–36.0)
MCV: 98.1 fL (ref 80.0–100.0)
Platelets: 217 K/uL (ref 150–400)
RBC: 2.7 MIL/uL — ABNORMAL LOW (ref 4.22–5.81)
RDW: 16 % — ABNORMAL HIGH (ref 11.5–15.5)
WBC: 14.1 K/uL — ABNORMAL HIGH (ref 4.0–10.5)
nRBC: 0 % (ref 0.0–0.2)

## 2024-01-10 LAB — TYPE AND SCREEN
ABO/RH(D): O POS
Antibody Screen: NEGATIVE
Unit division: 0
Unit division: 0

## 2024-01-10 LAB — RENAL FUNCTION PANEL
Albumin: 2.5 g/dL — ABNORMAL LOW (ref 3.5–5.0)
Albumin: 2.2 g/dL — ABNORMAL LOW (ref 3.5–5.0)
Anion gap: 16 — ABNORMAL HIGH (ref 5–15)
Anion gap: 12 (ref 5–15)
BUN: 49 mg/dL — ABNORMAL HIGH (ref 8–23)
BUN: 46 mg/dL — ABNORMAL HIGH (ref 8–23)
CO2: 18 mmol/L — ABNORMAL LOW (ref 22–32)
CO2: 19 mmol/L — ABNORMAL LOW (ref 22–32)
Calcium: 7.8 mg/dL — ABNORMAL LOW (ref 8.9–10.3)
Calcium: 7.4 mg/dL — ABNORMAL LOW (ref 8.9–10.3)
Chloride: 96 mmol/L — ABNORMAL LOW (ref 98–111)
Chloride: 100 mmol/L (ref 98–111)
Creatinine, Ser: 6.9 mg/dL — ABNORMAL HIGH (ref 0.61–1.24)
Creatinine, Ser: 5.84 mg/dL — ABNORMAL HIGH (ref 0.61–1.24)
GFR, Estimated: 8 mL/min — ABNORMAL LOW (ref >60)
GFR, Estimated: 10 mL/min — ABNORMAL LOW (ref >60)
Glucose, Bld: 221 mg/dL — ABNORMAL HIGH (ref 70–99)
Glucose, Bld: 135 mg/dL — ABNORMAL HIGH (ref 70–99)
Phosphorus: 7.1 mg/dL — ABNORMAL HIGH (ref 2.5–4.6)
Phosphorus: 6.2 mg/dL — ABNORMAL HIGH (ref 2.5–4.6)
Potassium: 5.2 mmol/L — ABNORMAL HIGH (ref 3.5–5.1)
Potassium: 4.9 mmol/L (ref 3.5–5.1)
Sodium: 130 mmol/L — ABNORMAL LOW (ref 135–145)
Sodium: 131 mmol/L — ABNORMAL LOW (ref 135–145)

## 2024-01-10 LAB — POCT I-STAT 7, (LYTES, BLD GAS, ICA,H+H)
Acid-base deficit: 9 mmol/L — ABNORMAL HIGH (ref 0.0–2.0)
Acid-base deficit: 9 mmol/L — ABNORMAL HIGH (ref 0.0–2.0)
Bicarbonate: 16.5 mmol/L — ABNORMAL LOW (ref 20.0–28.0)
Bicarbonate: 18.7 mmol/L — ABNORMAL LOW (ref 20.0–28.0)
Calcium, Ion: 1.08 mmol/L — ABNORMAL LOW (ref 1.15–1.40)
Calcium, Ion: 1.1 mmol/L — ABNORMAL LOW (ref 1.15–1.40)
HCT: 25 % — ABNORMAL LOW (ref 39.0–52.0)
HCT: 25 % — ABNORMAL LOW (ref 39.0–52.0)
Hemoglobin: 8.5 g/dL — ABNORMAL LOW (ref 13.0–17.0)
Hemoglobin: 8.5 g/dL — ABNORMAL LOW (ref 13.0–17.0)
O2 Saturation: 96 %
O2 Saturation: 98 %
Patient temperature: 98.3
Potassium: 5.3 mmol/L — ABNORMAL HIGH (ref 3.5–5.1)
Potassium: 4.9 mmol/L (ref 3.5–5.1)
Sodium: 130 mmol/L — ABNORMAL LOW (ref 135–145)
Sodium: 131 mmol/L — ABNORMAL LOW (ref 135–145)
TCO2: 17 mmol/L — ABNORMAL LOW (ref 22–32)
TCO2: 20 mmol/L — ABNORMAL LOW (ref 22–32)
pCO2 arterial: 33.3 mmHg (ref 32–48)
pCO2 arterial: 47.8 mmHg (ref 32–48)
pH, Arterial: 7.301 — ABNORMAL LOW (ref 7.35–7.45)
pH, Arterial: 7.2 — ABNORMAL LOW (ref 7.35–7.45)
pO2, Arterial: 88 mmHg (ref 83–108)
pO2, Arterial: 131 mmHg — ABNORMAL HIGH (ref 83–108)

## 2024-01-10 LAB — BPAM RBC
Blood Product Expiration Date: 202601012359
Blood Product Unit Number: 202601012359
ISSUE DATE / TIME: 202512081350
PRODUCT CODE: 202512081350
PRODUCT CODE: 202601012359
Unit Type and Rh: 5100
Unit Type and Rh: 202601012359
Unit Type and Rh: 5100
Unit Type and Rh: 5100

## 2024-01-10 LAB — GLUCOSE, CAPILLARY
Glucose-Capillary: 133 mg/dL — ABNORMAL HIGH (ref 70–99)
Glucose-Capillary: 134 mg/dL — ABNORMAL HIGH (ref 70–99)
Glucose-Capillary: 134 mg/dL — ABNORMAL HIGH (ref 70–99)
Glucose-Capillary: 134 mg/dL — ABNORMAL HIGH (ref 70–99)
Glucose-Capillary: 153 mg/dL — ABNORMAL HIGH (ref 70–99)
Glucose-Capillary: 184 mg/dL — ABNORMAL HIGH (ref 70–99)
Glucose-Capillary: 230 mg/dL — ABNORMAL HIGH (ref 70–99)
Glucose-Capillary: 234 mg/dL — ABNORMAL HIGH (ref 70–99)
Glucose-Capillary: 234 mg/dL — ABNORMAL HIGH (ref 70–99)

## 2024-01-10 LAB — POCT ACTIVATED CLOTTING TIME
Activated Clotting Time: 230 s
Activated Clotting Time: 235 s

## 2024-01-10 LAB — FERRITIN: Ferritin: 1048 ng/mL — ABNORMAL HIGH (ref 24–336)

## 2024-01-10 LAB — IRON AND TIBC
Iron: 21 ug/dL — ABNORMAL LOW (ref 45–182)
Saturation Ratios: 10 % — ABNORMAL LOW (ref 17.9–39.5)
TIBC: 210 ug/dL — ABNORMAL LOW (ref 250–450)
UIBC: 189 ug/dL

## 2024-01-10 SURGERY — TRANSCAROTID ARTERY REVASCULARIZATION (TCAR)
Anesthesia: General | Laterality: Left

## 2024-01-10 MED ORDER — HEPARIN SODIUM (PORCINE) 5000 UNIT/ML IJ SOLN: 5000.0000 [IU] | Freq: Three times a day (TID) | INTRAMUSCULAR | Status: DC

## 2024-01-10 MED ORDER — SODIUM CHLORIDE 0.9 % IV SOLN: 250.0000 mL | INTRAVENOUS | Status: AC

## 2024-01-10 MED ORDER — LIDOCAINE 2% (20 MG/ML) 5 ML SYRINGE
INTRAMUSCULAR | Status: DC | PRN
  Administered 2024-01-10: 80 mg via INTRAVENOUS

## 2024-01-10 MED ORDER — CLOPIDOGREL BISULFATE 75 MG PO TABS
75.0000 mg | ORAL_TABLET | Freq: Every day | ORAL | Status: DC
  Administered 2024-01-11: 75 mg
  Filled 2024-01-10: qty 1

## 2024-01-10 MED ORDER — FENTANYL CITRATE (PF) 50 MCG/ML IJ SOSY
25.0000 ug | PREFILLED_SYRINGE | INTRAMUSCULAR | Status: DC | PRN
  Administered 2024-01-10: 50 ug via INTRAVENOUS
  Administered 2024-01-12 – 2024-01-13 (×2): 25 ug via INTRAVENOUS
  Filled 2024-01-10 (×4): qty 1

## 2024-01-10 MED ORDER — DEXTROSE 50 % IV SOLN: 0.0000 mL | INTRAVENOUS | Status: DC | PRN

## 2024-01-10 MED ORDER — INSULIN GLARGINE 100 UNIT/ML ~~LOC~~ SOLN
5.0000 [IU] | Freq: Once | SUBCUTANEOUS | Status: AC
  Administered 2024-01-10: 5 [IU] via SUBCUTANEOUS
  Filled 2024-01-10: qty 0.05

## 2024-01-10 MED ORDER — CEFAZOLIN SODIUM-DEXTROSE 2-3 GM-%(50ML) IV SOLR
INTRAVENOUS | Status: DC | PRN
  Administered 2024-01-10: 2 g via INTRAVENOUS

## 2024-01-10 MED ORDER — PRISMASOL BGK 4/2.5 32-4-2.5 MEQ/L EC SOLN: Status: DC

## 2024-01-10 MED ORDER — PROPOFOL 1000 MG/100ML IV EMUL
0.0000 ug/kg/min | INTRAVENOUS | Status: DC
  Administered 2024-01-11 (×2): 30 ug/kg/min via INTRAVENOUS
  Filled 2024-01-10 (×2): qty 100

## 2024-01-10 MED ORDER — HEPARIN SODIUM (PORCINE) 1000 UNIT/ML IJ SOLN
INTRAMUSCULAR | Status: DC | PRN
  Administered 2024-01-10: 3000 [IU] via INTRAVENOUS
  Administered 2024-01-10: 13000 [IU] via INTRAVENOUS
  Administered 2024-01-10: 2000 [IU] via INTRAVENOUS

## 2024-01-10 MED ORDER — PROPOFOL 10 MG/ML IV BOLUS
INTRAVENOUS | Status: DC | PRN
  Administered 2024-01-10: 50 ug/kg/min via INTRAVENOUS
  Administered 2024-01-10: 130 mg via INTRAVENOUS

## 2024-01-10 MED ORDER — GLYCOPYRROLATE PF 0.2 MG/ML IJ SOSY
PREFILLED_SYRINGE | INTRAMUSCULAR | Status: DC | PRN
  Administered 2024-01-10: .2 mg via INTRAVENOUS

## 2024-01-10 MED ORDER — HEMOSTATIC AGENTS (NO CHARGE) OPTIME
TOPICAL | Status: DC | PRN
  Administered 2024-01-10: 1 via TOPICAL

## 2024-01-10 MED ORDER — NOREPINEPHRINE 4 MG/250ML-% IV SOLN
0.0000 ug/min | INTRAVENOUS | Status: DC
  Administered 2024-01-10: 10 ug/min via INTRAVENOUS
  Administered 2024-01-11: 5 ug/min via INTRAVENOUS
  Filled 2024-01-10 (×2): qty 250

## 2024-01-10 MED ORDER — IODIXANOL 320 MG/ML IV SOLN
INTRAVENOUS | Status: DC | PRN
  Administered 2024-01-10: 15 mL via INTRA_ARTERIAL

## 2024-01-10 MED ORDER — SODIUM CHLORIDE 0.9 % IV SOLN: INTRAVENOUS | Status: DC | PRN

## 2024-01-10 MED ORDER — ROCURONIUM BROMIDE 10 MG/ML (PF) SYRINGE
PREFILLED_SYRINGE | INTRAVENOUS | Status: DC | PRN
  Administered 2024-01-10: 60 mg via INTRAVENOUS
  Administered 2024-01-10: 40 mg via INTRAVENOUS

## 2024-01-10 MED ORDER — FENTANYL CITRATE (PF) 100 MCG/2ML IJ SOLN
INTRAMUSCULAR | Status: AC
  Filled 2024-01-10: qty 2

## 2024-01-10 MED ORDER — HEPARIN SODIUM (PORCINE) 5000 UNIT/ML IJ SOLN
5000.0000 [IU] | Freq: Three times a day (TID) | INTRAMUSCULAR | Status: DC
  Administered 2024-01-11 – 2024-01-18 (×23): 5000 [IU] via SUBCUTANEOUS
  Filled 2024-01-10 (×23): qty 1

## 2024-01-10 MED ORDER — ORAL CARE MOUTH RINSE: 15.0000 mL | OROMUCOSAL | Status: DC | PRN

## 2024-01-10 MED ORDER — FENTANYL CITRATE (PF) 250 MCG/5ML IJ SOLN
INTRAMUSCULAR | Status: DC | PRN
  Administered 2024-01-10: 25 ug via INTRAVENOUS

## 2024-01-10 MED ORDER — IOHEXOL 350 MG/ML SOLN
100.0000 mL | Freq: Once | INTRAVENOUS | Status: AC | PRN
  Administered 2024-01-10: 100 mL via INTRAVENOUS

## 2024-01-10 MED ORDER — ETOMIDATE 2 MG/ML IV SOLN
INTRAVENOUS | Status: AC
  Filled 2024-01-10: qty 20

## 2024-01-10 MED ORDER — ORAL CARE MOUTH RINSE
15.0000 mL | OROMUCOSAL | Status: DC
  Administered 2024-01-10 – 2024-01-13 (×28): 15 mL via OROMUCOSAL

## 2024-01-10 MED ORDER — MIDAZOLAM HCL 2 MG/2ML IJ SOLN
INTRAMUSCULAR | Status: AC
  Filled 2024-01-10: qty 2

## 2024-01-10 MED ORDER — ATORVASTATIN CALCIUM 80 MG PO TABS
80.0000 mg | ORAL_TABLET | Freq: Every day | ORAL | Status: DC
  Administered 2024-01-11: 80 mg
  Filled 2024-01-10: qty 1

## 2024-01-10 MED ORDER — PANTOPRAZOLE SODIUM 40 MG IV SOLR
40.0000 mg | INTRAVENOUS | Status: DC
  Administered 2024-01-11 – 2024-01-16 (×6): 40 mg via INTRAVENOUS
  Filled 2024-01-10 (×6): qty 10

## 2024-01-10 MED ORDER — INSULIN REGULAR(HUMAN) IN NACL 100-0.9 UT/100ML-% IV SOLN
INTRAVENOUS | Status: DC
  Administered 2024-01-10: 7 [IU]/h via INTRAVENOUS
  Administered 2024-01-11: 2.2 [IU]/h via INTRAVENOUS
  Filled 2024-01-10 (×2): qty 100

## 2024-01-10 MED ORDER — EPHEDRINE SULFATE-NACL 50-0.9 MG/10ML-% IV SOSY
PREFILLED_SYRINGE | INTRAVENOUS | Status: DC | PRN
  Administered 2024-01-10: 5 mg via INTRAVENOUS

## 2024-01-10 MED ORDER — TRAMADOL HCL 50 MG PO TABS: 50.0000 mg | ORAL_TABLET | Freq: Two times a day (BID) | ORAL | Status: DC | PRN

## 2024-01-10 MED ORDER — DOCUSATE SODIUM 50 MG/5ML PO LIQD
200.0000 mg | Freq: Every day | ORAL | Status: DC
  Administered 2024-01-11: 200 mg
  Filled 2024-01-10: qty 20

## 2024-01-10 MED ORDER — CEFAZOLIN SODIUM-DEXTROSE 2-4 GM/100ML-% IV SOLN
2.0000 g | Freq: Two times a day (BID) | INTRAVENOUS | Status: AC
  Administered 2024-01-11 (×2): 2 g via INTRAVENOUS
  Filled 2024-01-10 (×2): qty 100

## 2024-01-10 MED ORDER — KETAMINE HCL 50 MG/5ML IJ SOSY
PREFILLED_SYRINGE | INTRAMUSCULAR | Status: AC
  Filled 2024-01-10: qty 10

## 2024-01-10 MED ORDER — HEPARIN 6000 UNIT IRRIGATION SOLUTION
Status: DC | PRN
  Administered 2024-01-10: 1

## 2024-01-10 MED ORDER — HEPARIN SODIUM (PORCINE) 1000 UNIT/ML DIALYSIS
1000.0000 [IU] | INTRAMUSCULAR | Status: DC | PRN
  Administered 2024-01-11: 3200 [IU] via INTRAVENOUS_CENTRAL
  Administered 2024-01-12: 4000 [IU] via INTRAVENOUS_CENTRAL
  Administered 2024-01-13: 3200 [IU] via INTRAVENOUS_CENTRAL
  Filled 2024-01-10: qty 3
  Filled 2024-01-10: qty 4
  Filled 2024-01-10: qty 5
  Filled 2024-01-10: qty 4

## 2024-01-10 MED ORDER — ROCURONIUM BROMIDE 10 MG/ML (PF) SYRINGE
PREFILLED_SYRINGE | INTRAVENOUS | Status: AC
  Filled 2024-01-10: qty 10

## 2024-01-10 MED ORDER — MIDODRINE HCL 5 MG PO TABS
10.0000 mg | ORAL_TABLET | Freq: Three times a day (TID) | ORAL | Status: DC
  Administered 2024-01-11 (×2): 10 mg
  Filled 2024-01-10 (×2): qty 2

## 2024-01-10 MED ORDER — FENTANYL CITRATE (PF) 50 MCG/ML IJ SOSY
PREFILLED_SYRINGE | INTRAMUSCULAR | Status: AC
  Filled 2024-01-10: qty 2

## 2024-01-10 MED ORDER — MIDODRINE HCL 5 MG PO TABS
10.0000 mg | ORAL_TABLET | Freq: Three times a day (TID) | ORAL | Status: DC
  Administered 2024-01-10: 10 mg via ORAL
  Filled 2024-01-10 (×2): qty 2

## 2024-01-10 MED ORDER — INSULIN ASPART 100 UNIT/ML IJ SOLN
0.0000 [IU] | INTRAMUSCULAR | Status: DC
  Administered 2024-01-10: 8 [IU] via SUBCUTANEOUS
  Filled 2024-01-10: qty 8

## 2024-01-10 MED ORDER — 0.9 % SODIUM CHLORIDE (POUR BTL) OPTIME
TOPICAL | Status: DC | PRN
  Administered 2024-01-10: 1000 mL

## 2024-01-10 MED ORDER — PROPOFOL 10 MG/ML IV BOLUS
INTRAVENOUS | Status: AC
  Filled 2024-01-10: qty 20

## 2024-01-10 MED ORDER — PROPOFOL 1000 MG/100ML IV EMUL
INTRAVENOUS | Status: AC
  Administered 2024-01-10: 40 ug/kg/min via INTRAVENOUS
  Filled 2024-01-10: qty 100

## 2024-01-10 MED ORDER — PROTAMINE SULFATE 10 MG/ML IV SOLN
INTRAVENOUS | Status: DC | PRN
  Administered 2024-01-10: 50 mg via INTRAVENOUS

## 2024-01-10 MED ORDER — CLOPIDOGREL BISULFATE 300 MG PO TABS
300.0000 mg | ORAL_TABLET | Freq: Once | ORAL | Status: AC
  Administered 2024-01-10: 300 mg
  Filled 2024-01-10: qty 1

## 2024-01-10 MED ORDER — HEPARIN 6000 UNIT IRRIGATION SOLUTION
Status: AC
  Filled 2024-01-10: qty 500

## 2024-01-10 MED FILL — Lidocaine HCl Local Preservative Free (PF) Inj 2%: INTRAMUSCULAR | Qty: 14 | Status: AC

## 2024-01-10 MED FILL — Heparin Sodium (Porcine) Inj 1000 Unit/ML: Qty: 1000 | Status: AC

## 2024-01-10 MED FILL — Potassium Chloride Inj 2 mEq/ML: INTRAVENOUS | Qty: 40 | Status: AC

## 2024-01-10 SURGICAL SUPPLY — 39 items
BAG BANDED W/RUBBER/TAPE 36X54 (MISCELLANEOUS) ×1 IMPLANT
BAG COUNTER SPONGE SURGICOUNT (BAG) ×1 IMPLANT
BALLOON STERLING RX 7X15X80 (BALLOONS) IMPLANT
CANISTER SUCTION 3000ML PPV (SUCTIONS) ×1 IMPLANT
CATH BALLN ENROUTE 6X35 (CATHETERS) IMPLANT
CATH BEACON 5 .035 40 KMP TP (CATHETERS) IMPLANT
CATH FOLEY LF 16FR (CATHETERS) IMPLANT
CLIP TI MEDIUM 6 (CLIP) ×1 IMPLANT
CLIP TI WIDE RED SMALL 6 (CLIP) ×1 IMPLANT
COVER DOME SNAP 22 D (MISCELLANEOUS) ×1 IMPLANT
COVER PROBE W GEL 5X96 (DRAPES) ×1 IMPLANT
DERMABOND ADVANCED .7 DNX12 (GAUZE/BANDAGES/DRESSINGS) ×1 IMPLANT
DRAPE FEMORAL ANGIO 80X135IN (DRAPES) ×1 IMPLANT
ELECTRODE REM PT RTRN 9FT ADLT (ELECTROSURGICAL) ×1 IMPLANT
GOWN STRL REUS W/ TWL LRG LVL3 (GOWN DISPOSABLE) ×2 IMPLANT
GOWN STRL REUS W/ TWL XL LVL3 (GOWN DISPOSABLE) ×1 IMPLANT
GUIDEWIRE ENROUTE 0.014 (WIRE) ×1 IMPLANT
HEMOSTAT SNOW SURGICEL 2X4 (HEMOSTASIS) IMPLANT
KIT BASIN OR (CUSTOM PROCEDURE TRAY) ×1 IMPLANT
KIT ENCORE 26 ADVANTAGE (KITS) ×1 IMPLANT
KIT INTRODUCER GALT 7 (INTRODUCER) ×1 IMPLANT
KIT TURNOVER KIT B (KITS) ×1 IMPLANT
PACK CAROTID (CUSTOM PROCEDURE TRAY) ×1 IMPLANT
POSITIONER HEAD DONUT 9IN (MISCELLANEOUS) ×1 IMPLANT
SET MICROPUNCTURE 5F STIFF (MISCELLANEOUS) ×1 IMPLANT
SHEATH PINNACLE 5F 10CM (SHEATH) IMPLANT
SOLN STERILE WATER BTL 1000 ML (IV SOLUTION) ×1 IMPLANT
STENT TRANSCAROTID SYS 10X40 (Permanent Stent) IMPLANT
SUT MNCRL AB 4-0 PS2 18 (SUTURE) ×1 IMPLANT
SUT PROLENE 5 0 C 1 24 (SUTURE) ×1 IMPLANT
SUT SILK 2 0 PERMA HAND 18 BK (SUTURE) ×1 IMPLANT
SUT VIC AB 3-0 SH 27X BRD (SUTURE) ×1 IMPLANT
SYR 10ML LL (SYRINGE) ×3 IMPLANT
SYR 20ML LL LF (SYRINGE) ×1 IMPLANT
SYR CONTROL 10ML LL (SYRINGE) IMPLANT
SYSTEM ENROUTE TCAR NEURO PLUS (FILTER) IMPLANT
TOWEL GREEN STERILE (TOWEL DISPOSABLE) ×1 IMPLANT
WIRE AMPLATZ SSTIFF .035X260CM (WIRE) IMPLANT
WIRE BENTSON .035X145CM (WIRE) ×1 IMPLANT

## 2024-01-10 NOTE — Progress Notes (Addendum)
 STROKE TEAM PROGRESS NOTE    SIGNIFICANT HOSPITAL EVENTS 12/5: Patient admitted to [facility] with chest pain and chest pressure during dialysis, cardiac cath revealed three-vessel disease, transferred here for further care 12/8: Three-vessel CABG performed 12/9: Code stroke activated for aphasia and right hemiparesis, symptoms noted to improve with head of bed flat and elevated MAP  INTERIM HISTORY/SUBJECTIVE  Patient's blood pressure has remained stable although lower than optimal overnight, and neurological symptoms have been stable.  However, respiratory status has worsened with fluid overload and patient is about to be started on CRRT.  OBJECTIVE  CBC    Component Value Date/Time   WBC 14.1 (H) 01/10/2024 0504   RBC 2.70 (L) 01/10/2024 0504   HGB 8.4 (L) 01/10/2024 0504   HCT 26.5 (L) 01/10/2024 0504   PLT 217 01/10/2024 0504   MCV 98.1 01/10/2024 0504   MCH 31.1 01/10/2024 0504   MCHC 31.7 01/10/2024 0504   RDW 16.0 (H) 01/10/2024 0504   LYMPHSABS 1.3 10/21/2023 1134   MONOABS 0.9 10/21/2023 1134   EOSABS 0.6 (H) 10/21/2023 1134   BASOSABS 0.1 10/21/2023 1134    BMET    Component Value Date/Time   NA 130 (L) 01/10/2024 0504   NA 144 11/09/2012 0938   K 5.2 (H) 01/10/2024 0504   CL 96 (L) 01/10/2024 0504   CO2 18 (L) 01/10/2024 0504   GLUCOSE 221 (H) 01/10/2024 0504   BUN 49 (H) 01/10/2024 0504   BUN 31 (H) 11/09/2012 0938   CREATININE 6.90 (H) 01/10/2024 0504   CREATININE 1.68 (H) 09/03/2012 1009   CALCIUM  7.8 (L) 01/10/2024 0504   CALCIUM  8.5 (L) 05/06/2022 1030   GFRNONAA 8 (L) 01/10/2024 0504   GFRNONAA 44 (L) 09/03/2012 1009    IMAGING past 24 hours DG Chest Port 1 View Result Date: 01/10/2024 CLINICAL DATA:  Pneumothorax. EXAM: PORTABLE CHEST 1 VIEW COMPARISON:  01/09/2024 FINDINGS: The cardio pericardial silhouette is enlarged. Vascular congestion again noted with retrocardiac left base atelectasis or infiltrate. Small pleural effusions. Stable  position right IJ central line. Left IJ central line remains in place with tip position in the distal left innominate vein near the confluence. Thoracic drains again noted over the lower chest. Telemetry leads overlie the chest. IMPRESSION: 1. No substantial interval change. No evidence for pneumothorax. 2. Persistent vascular congestion with retrocardiac left base atelectasis or infiltrate. Electronically Signed   By: Camellia Candle M.D.   On: 01/10/2024 06:54   EEG adult Result Date: 01/09/2024 Shelton Arlin KIDD, MD     01/09/2024  3:37 PM Patient Name: Timothy Holloway MRN: [id number] Epilepsy Attending: Arlin KIDD Shelton Referring Physician/Provider: Jenna Maude BRAVO, NP Date: 01/09/2024 Duration: 22.36 mins Patient history:  68 y.o. male with past medical history as above who is postoperative day 1 from a CABG, was noted to have speech difficulty and right-sided weakness with a last known well of 8:40 AM. EEG to evaluate for seizure Level of alertness: Awake AEDs during EEG study: None Technical aspects: This EEG study was done with scalp electrodes positioned according to the 10-20 International system of electrode placement. Electrical activity was reviewed with band pass filter of 1-70Hz , sensitivity of 7 uV/mm, display speed of 57mm/sec with a 60Hz  notched filter applied as appropriate. EEG data were recorded continuously and digitally stored.  Video monitoring was available and reviewed as appropriate. Description: EEG showed continuous generalized 3 to 6 Hz theta-delta slowing. Hyperventilation and photic stimulation were not performed.   ABNORMALITY - Continuous  slow, generalized IMPRESSION: This study is suggestive of generalized cerebral dysfunction (encephalopathy). No seizures or epileptiform discharges were seen throughout the recording. Priyanka MALVA Krebs    Vitals:   01/10/24 0700 01/10/24 0715 01/10/24 0730 01/10/24 0800  BP:    (!) 129/49  Pulse: 89 89 91 89  Resp: 19 19 19 14   Temp:    97.6 F (36.4 C)   TempSrc:   Axillary   SpO2: 98% 97% 98% 98%  Weight:      Height:         PHYSICAL EXAM General: Ill-appearing elderly patient in moderate distress due to dyspnea Psych:  Mood and affect appropriate for situation CV: Regular rate and rhythm on monitor Respiratory: Labored, moist sounding respirations on supplemental O2  NEURO:  Mental Status: AA&Ox3, patient is able to give some history Speech/Language: speech is without dysarthria or or overt aphasia, but paucity of speech noted due to dyspnea  Cranial Nerves:  II: PERRL.  Questionable right lower quadrantanopia III, IV, VI: EOMI. Eyelids elevate symmetrically.  V: Sensation is intact to light touch and symmetrical to face.  VII: Face is symmetrical resting and smiling VIII: hearing intact to voice. IX, X: Phonation is normal.  KP:Dynloizm shrug 5/5. XII: tongue is midline without fasciculations. Motor: Able to move all 4 extremities with antigravity strength, but right arm weaker than left and drift present in right arm Tone: is normal and bulk is normal Sensation- Intact to light touch bilaterally.  Coordination: FTN intact on the left, unable to perform on the right Gait- deferred  Most Recent NIH  1a Level of Conscious.: 0 1b LOC Questions: 0 1c LOC Commands: 0 2 Best Gaze: 0 3 Visual: 1 4 Facial Palsy: 0 5a Motor Arm - left: 0 5b Motor Arm - Right: 1 6a Motor Leg - Left: 0 6b Motor Leg - Right: 0 7 Limb Ataxia: 0 8 Sensory: 0 9 Best Language: 0 10 Dysarthria: 0 11 Extinct. and Inatten.: 0 TOTAL: 3   ASSESSMENT/PLAN  Mr. AHMARION SARACENO is a 68 y.o. male with history of CAD, end-stage renal disease on dialysis, hypertension, hyperlipidemia and diabetes who was originally admitted for chest pain and pressure, was transferred here for CABG, underwent surgery and was postoperatively found to have aphasia and right hemiparesis.  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  Overnight, he developed fluid overload and respiratory distress and is now being placed on CRRT.  MRI is pending and cannot be performed until pacer wires have been removed.  Stroke: Likely left MCA infarct due to large vessel disease source of high grade left ICA stenosis Code Stroke CT head No acute abnormality. ASPECTS 10.    CTA head & neck 80% stenosis of left ICA, 50% stenosis of right ICA, moderate to severe stenosis of right vertebral artery V2 segment, moderate stenosis of right vertebral artery V3 V4 segments, fetal right PCA MRI not able to perform now just s/p CABG 2D Echo hypokinesis of distal anteroseptal wall and apex, EF 40 to 45%  LDL 33 HgbA1c 9.9 VTE prophylaxis - subcutaneous heparin  aspirin  81 mg daily prior to admission, now on aspirin  325 mg daily, plan to start Plavix  after pacer wires removed Therapy recommendations:  Pending Disposition: Pending  Blood pressure management Home meds: Hydralazine  100 mg twice daily, hydrochlorothiazide  25 mg daily, losartan  100 mg  daily, clonidine  0.1 mg 2 times daily, carvedilol  6.25 mg twice daily Unstable, requiring Levophed  to meet MAP goal On midodrine   Keep MAP at least 70 to maintain cerebral perfusion in the setting of left ICA stenosis Ideally would keep head of bed flat but cannot do this in the setting of respiratory distress  Left ICA stenosis Patient found to have 80% stenosis of left ICA on CT angiogram CUS left ICA 80-99% stenosis, R ICA 40-59% stenosis Keep head of bed flat and MAP above 70 is much as possible to increase cerebral perfusion Vascular surgery consulted, appreciate recommendations Tentative plan for TCAR on Monday  Hyperlipidemia Home meds: Atorvastatin  10 mg daily, increased to 80 per primary team LDL 33, goal < 70 Continue statin at discharge  Diabetes type II poorly controlled Home  meds: None HgbA1c 9.9, goal < 7.0 CBGs SSI Recommend close follow-up with PCP for better DM control  Dysphagia Patient has post-stroke dysphagia, SLP consulted Recommend  n.p.o. until passes swallow by speech service Advance diet as tolerated  CAD post three-vessel CABG Patient underwent CABG on 12/8 Postoperative care per cardiothoracic surgery  Respiratory distress/fluid overload ESRD Patient noted to have labored respirations with wet lung sounds this morning CRRT started today  Other Stroke Risk Factors Obesity, Body mass index is 38.93 kg/m., BMI >/= 30 associated with increased stroke risk, recommend weight loss, diet and exercise as appropriate  Congestive heart failure  Other Active Problems Leukocytosis WBC 16.3--19.0--14.1 Anemia of chronic disease Hb 8.5--8.8--8.4  Hospital day # 5  Patient seen by NP and then by MD, MD to edit note as needed. Cortney E Everitt Clint Kill , MSN, AGACNP-BC [facility] See Amion for schedule and pager information 01/10/2024 11:00 AM  ATTENDING NOTE: I reviewed above note and agree with the assessment and plan. Pt was seen and examined.   RN and primary team on the bedside. No family in room. Pt lying in bed, on CRRT, wet lung sound with mildly labored breathing. Severe dysarthria, L gaze preference, R hemiparesis, R hemianopia with R facial droop. Seems symptoms just worsened for the last 15 min per CCM team, but seems again getting better but not to where he was before. Given the fluctuating symptoms and potential viable tissue, will activate code stroke and do CTA head and neck and perfusion to see if can be candidate for thrombectomy. Case discussed with CCM Dr Gretta  For detailed assessment and plan, please refer to above as I have made changes wherever appropriate.   Ary Cummins, MD PhD Stroke Neurology 01/10/2024 1:25 PM    This patient is critically ill due to CAD s/p CABG, ESRD, L MCA stroke, left carotid high  grade stenosis and at significant risk of neurological worsening, death form recurrent stroke hemorrhagic conversion, heart failure, seizure. This patient's care requires constant monitoring of vital signs, hemodynamics, respiratory and cardiac monitoring, review of multiple databases, neurological assessment, discussion with family, other specialists and medical decision making of high complexity. I spent 50 minutes of neurocritical care time in the care of this patient.    To contact Stroke Continuity provider, please refer to [email or web address]. After hours, contact General Neurology

## 2024-01-10 NOTE — Inpatient Diabetes Management (Addendum)
 Inpatient Diabetes Program Recommendations  AACE/ADA: New Consensus Statement on Inpatient Glycemic Control   Target Ranges:  Prepandial:   less than 140 mg/dL      Peak postprandial:   less than 180 mg/dL (1-2 hours)      Critically ill patients:  140 - 180 mg/dL   Lab Results  Component Value Date   GLUCAP 234 (H) 01/10/2024   HGBA1C 9.9 (H) 01/05/2024    Latest Reference Range & Units 01/09/24 10:37 01/09/24 12:22 01/09/24 16:33 01/09/24 21:07 01/10/24 06:06  Glucose-Capillary 70 - 99 mg/dL 865 (H) 869 (H) 813 (H) 209 (H) 234 (H)   Review of Glycemic Control  Diabetes history: DM 2  Outpatient Diabetes medications:  Lantus  50 units q HS Humalog 10-20 units tid with meals   Current orders for Inpatient glycemic control:  Novolog  0-15 units tid with meals and HS Lantus  10 units daily   Inpatient Diabetes Program Recommendations:    Please consider increasing Lantus  to 15 units daily.   Thanks,  Lavanda Search, RN, MSN, [facility]  Inpatient Diabetes Coordinator  Pager [phone] (8a-5p)

## 2024-01-10 NOTE — Transfer of Care (Signed)
 Immediate Anesthesia Transfer of Care Note  Patient: Timothy Holloway  Procedure(s) Performed: LERETHA ARTERY REVASCULARIZATION (TCAR) (Left)  Patient Location: ICU  Anesthesia Type:General  Level of Consciousness: sedated and Patient remains intubated per anesthesia plan  Airway & Oxygen Therapy: Patient remains intubated per anesthesia plan and Patient placed on Ventilator (see vital sign flow sheet for setting)  Post-op Assessment: Report given to RN and Post -op Vital signs reviewed and stable  Post vital signs: Reviewed and stable  Last Vitals:  Vitals Value Taken Time  BP    Temp    Pulse 60 01/10/24 17:42  Resp 14 01/10/24 17:42  SpO2 99 % 01/10/24 17:42  Vitals shown include unfiled device data.  Last Pain:  Vitals:   01/10/24 1100  TempSrc: Oral  PainSc: 4       Patients Stated Pain Goal: 3 (01/10/24 0400)  Complications: No notable events documented.

## 2024-01-10 NOTE — Progress Notes (Signed)
 Stroke Expertise provided to patient. Resource guide reviewed with nurse for applicable diagnosis. Appropriate stroke care and orders confirmed in place. Discussed plan with bedside nurse, Josh, for the day.  For any questions related to patient's stroke care, please call:  Stroke Response Nurse (Monday - Friday 0700-1900): [phone] Nurse (Nights and Weekends): [phone]  Odell Bean, RN

## 2024-01-10 NOTE — Anesthesia Postprocedure Evaluation (Signed)
 Anesthesia Post Note  Patient: Timothy Holloway  Procedure(s) Performed: LERETHA ARTERY REVASCULARIZATION (TCAR) (Left)     Patient location during evaluation: SICU Anesthesia Type: General Level of consciousness: sedated Pain management: pain level controlled Vital Signs Assessment: post-procedure vital signs reviewed and stable Respiratory status: patient remains intubated per anesthesia plan Cardiovascular status: stable Postop Assessment: no apparent nausea or vomiting Anesthetic complications: no   No notable events documented.  Last Vitals:  Vitals:   01/10/24 1330 01/10/24 1400  BP: (!) 219/90 (!) 189/61  Pulse: (!) 59 (!) 57  Resp: (!) 26 20  Temp:    SpO2: 94% 98%    Last Pain:  Vitals:   01/10/24 1100  TempSrc: Oral  PainSc: 4                  Ivie Savitt,W. EDMOND

## 2024-01-10 NOTE — Progress Notes (Signed)
 PT Cancellation Note  Patient Details Name: Timothy Holloway MRN: [id number] DOB: July 21, 1955   Cancelled Treatment:    Reason Eval/Treat Not Completed: (P) Active bedrest order. Will plan to follow-up as able once activity orders progress.   Theo Ferretti, PT, DPT Acute Rehabilitation Services  Office: [phone]    Theo CHRISTELLA Ferretti 01/10/2024, 7:40 AM

## 2024-01-10 NOTE — Consult Note (Signed)
 Hospital Consult    Reason for Consult: Symptomatic left carotid stenosis Referring Physician: CT surgery MRN #:  [id number]  History of Present Illness: This is a 68 y.o. male with history of diabetes, hypertension, hyperlipidemia, ESRD admitted with an NSTEMI now status post CABG that vascular surgery been consulted for symptomatic left carotid stenosis.  Apparently was recovering in the ICU yesterday and had some word finding difficulty and right sided weakness.  Known 80% left ICA stenosis.  CTA neck also showed advanced atherosclerotic disease in the left carotid bulb into the ICA with 80% stenosis.  Apparently when his blood pressure drops he gets a pretty dense hemiparesis on the right.  Currently pacer wires and awaiting MRI brain.  Past Medical History:  Diagnosis Date   Anemia    Cataract    Chronic kidney disease (CKD), stage III (moderate) (HCC)    dr Faythe   Depression    Diabetes mellitus without complication (HCC)    DVT (deep venous thrombosis) (HCC)    as a child   ESRD on dialysis [facility])    M-W-F   Hyperlipidemia    Hypertension    Impotence    Insomnia    Obesity    Steal syndrome as complication of dialysis access     Past Surgical History:  Procedure Laterality Date   A/V FISTULAGRAM Right 10/11/2022   Procedure: A/V Fistulagram;  Surgeon: Serene Gaile ORN, MD;  Location: [facility] INVASIVE CV LAB;  Service: Cardiovascular;  Laterality: Right;   AV FISTULA PLACEMENT Left 01/11/2022   Procedure: LEFT ARM ARTERIOVENOUS (AV) FISTULA CREATION;  Surgeon: Lanis Fonda BRAVO, MD;  Location: [facility] OR;  Service: Vascular;  Laterality: Left;  PERIPHERAL NERVE BLOCK   AV FISTULA PLACEMENT Right 07/26/2022   Procedure: RIGHT UPPER EXTREMITY ARTERIOVENOUS (AV) FISTULA CREATION;  Surgeon: Oris Krystal FALCON, MD;  Location: [facility] ORS;  Service: Vascular;  Laterality: Right;   CORONARY ARTERY BYPASS GRAFT N/A 01/08/2024   Procedure: CORONARY ARTERY BYPASS GRAFTING (CABG) TIMES 3 USING LEFT  INTERNAL MAMMARY ARTERY AND ENDOSCOPICALLY HARVESTED RIGHT GREATER SAPHENOUS VEIN;  Surgeon: Shyrl Linnie KIDD, MD;  Location: [facility] OR;  Service: Open Heart Surgery;  Laterality: N/A;  second case   DIALYSIS/PERMA CATHETER INSERTION Right 10/23/2023   Procedure: DIALYSIS/PERMA CATHETER INSERTION;  Surgeon: Magda Debby SAILOR, MD;  Location: [facility] PV LAB;  Service: Cardiovascular;  Laterality: Right;   INSERTION OF DIALYSIS CATHETER Right 06/16/2022   Procedure: INSERTION OF RIGHT iNTERNAL Jugular TUNNELED DIALYSIS CATHETER;  Surgeon: Serene Gaile ORN, MD;  Location: [facility] OR;  Service: Vascular;  Laterality: Right;   KNEE SURGERY Left 01-31-2002   LEFT HEART CATH AND CORONARY ANGIOGRAPHY N/A 01/05/2024   Procedure: LEFT HEART CATH AND CORONARY ANGIOGRAPHY;  Surgeon: Jordan, Peter M, MD;  Location: [facility] INVASIVE CV LAB;  Service: Cardiovascular;  Laterality: N/A;   LIGATION OF ARTERIOVENOUS  FISTULA Left 06/16/2022   Procedure: LEFT ARM FISTULA LIGATION;  Surgeon: Serene Gaile ORN, MD;  Location: [facility] OR;  Service: Vascular;  Laterality: Left;   LIGATION OF ARTERIOVENOUS  FISTULA Right 11/08/2022   Procedure: LIGATION of RIGHT RADIOCEPHALIC FISTULA;  Surgeon: Sheree Penne Bruckner, MD;  Location: [facility] OR;  Service: Vascular;  Laterality: Right;   SPINE SURGERY     fusion of L4-L5   TEE WITHOUT CARDIOVERSION N/A 01/08/2024   Procedure: ECHOCARDIOGRAM, TRANSESOPHAGEAL;  Surgeon: Shyrl Linnie KIDD, MD;  Location: [facility] OR;  Service: Open Heart Surgery;  Laterality: N/A;   UPPER EXTREMITY ANGIOGRAPHY Right 11/03/2022  Procedure: Upper Extremity Angiography;  Surgeon: Gretta Lonni PARAS, MD;  Location: [facility] INVASIVE CV LAB;  Service: Cardiovascular;  Laterality: Right;    Allergies  Allergen Reactions   Latex Hives   Lodine [Etodolac] Nausea And Vomiting    Prior to Admission medications   Medication Sig Start Date End Date Taking? Authorizing Provider  acetaminophen  (TYLENOL ) 325 MG tablet Take 650 mg by  mouth every 6 (six) hours as needed for moderate pain. 06/06/20  Yes [provider]  albuterol  (VENTOLIN  HFA) 108 (90 Base) MCG/ACT inhaler Inhale 2 puffs into the lungs every 6 (six) hours as needed for wheezing. 09/14/23  Yes [provider]  aspirin  81 MG tablet Take 81 mg by mouth daily.   Yes [provider]  atorvastatin  (LIPITOR) 10 MG tablet Take 1 tablet (10 mg total) by mouth daily. Patient taking differently: Take 10 mg by mouth at bedtime. 08/06/12  Yes Cyrena Gwenn SQUIBB, MD  carvedilol  (COREG ) 6.25 MG tablet Take 6.25 mg by mouth 2 (two) times daily with a meal. 09/14/23  Yes [provider]  Cholecalciferol (VITAMIN D) 2000 UNITS CAPS Take 2,000 Units by mouth at bedtime.   Yes [provider]  cloNIDine  (CATAPRES ) 0.1 MG tablet Take 0.1 mg by mouth 2 (two) times daily. 05/16/22  Yes [provider]  diclofenac Sodium (VOLTAREN) 1 % GEL Apply 2 g topically daily as needed (pain).   Yes [provider]  EPINEPHrine  0.3 mg/0.3 mL IJ SOAJ injection Inject 0.3 mg into the muscle as needed for anaphylaxis. 09/14/23  Yes [provider]  fenofibrate  (TRICOR ) 145 MG tablet Take 1/2 tablet daily Patient taking differently: Take 72.5 mg by mouth every morning. 09/06/13  Yes Georgina Nancyann ORN, MD  furosemide  (LASIX ) 80 MG tablet Take 80 mg by mouth daily. 12/28/21  Yes [provider]  Glucosamine-Chondroit-Vit C-Mn (GLUCOSAMINE CHONDR 1500 COMPLX) CAPS Take 1 capsule by mouth 2 (two) times daily.   Yes [provider]  HUMALOG KWIKPEN 200 UNIT/ML KwikPen Inject 10-20 Units into the skin with breakfast, with lunch, and with evening meal. Sliding scale 07/19/21  Yes [provider]  hydrALAZINE  (APRESOLINE ) 100 MG tablet Take 100 mg by mouth 2 (two) times daily. 10/13/23  Yes [provider]  hydrochlorothiazide  (HYDRODIURIL ) 25 MG tablet Take 25 mg by mouth daily. 12/29/21  Yes [provider]   isosorbide  mononitrate (IMDUR ) 30 MG 24 hr tablet Take 30 mg by mouth at bedtime.   Yes [provider]  LANTUS  SOLOSTAR 100 UNIT/ML Solostar Pen Inject 50 Units into the skin at bedtime. 10/25/21  Yes [provider]  losartan  (COZAAR ) 100 MG tablet Take 100 mg by mouth at bedtime. 10/25/21  Yes [provider]  meclizine (ANTIVERT) 25 MG tablet Take 25 mg by mouth daily as needed for dizziness. 04/24/15  Yes [provider]  methocarbamol (ROBAXIN) 500 MG tablet Take 1 tablet by mouth daily as needed for muscle spasms. 10/14/19  Yes [provider]  Multiple Vitamin (MULTIVITAMIN) tablet Take 1 tablet by mouth daily.   Yes [provider]  sertraline (ZOLOFT) 50 MG tablet Take 50 mg by mouth daily as needed (mood). 12/30/21  Yes [provider]  sodium chloride  (OCEAN) 0.65 % SOLN nasal spray Place 1 spray into both nostrils daily as needed for congestion.   Yes [provider]  sucroferric oxyhydroxide (VELPHORO ) 500 MG chewable tablet Chew 500 mg by mouth 3 (three) times daily with meals. 05/26/22  Yes [provider]  valACYclovir (VALTREX) 1000 MG tablet Take 1,000 mg by mouth 2 (two) times daily. 01/10/17  Yes [provider]  zolpidem  (AMBIEN ) 10 MG tablet Take 1 tablet (10 mg total) by mouth at bedtime as needed for sleep. 01/11/13  Yes Cyrena Gwenn SQUIBB, MD    Social History   Socioeconomic History   Marital status: Married    Spouse name: Not on file   Number of children: Not on file   Years of education: Not on file   Highest education level: Not on file  Occupational History   Not on file  Tobacco Use   Smoking status: Never    Passive exposure: Never   Smokeless tobacco: Not on file  Vaping Use   Vaping status: Never Used  Substance and Sexual Activity   Alcohol use: No   Drug use: No   Sexual activity: Yes    Comment: not much  Other Topics Concern   Not on file  Social History  Narrative   Not on file   Social Drivers of Health   Financial Resource Strain: Low Risk (09/14/2023)   [facility]   Overall Financial Resource Strain (CARDIA)    How hard is it for you to pay for the very basics like food, housing, medical care, and heating?: Not hard at all  Food Insecurity: No Food Insecurity (01/06/2024)   Hunger Vital Sign    Worried About Running Out of Food in the Last Year: Never true    Ran Out of Food in the Last Year: Never true  Transportation Needs: No Transportation Needs (01/05/2024)   PRAPARE - Administrator, Civil Service (Medical): No    Lack of Transportation (Non-Medical): No  Physical Activity: Not on file  Stress: Not on file  Social Connections: Unknown (01/06/2024)   Social Connection and Isolation Panel    Frequency of Communication with Friends and Family: Not on file    Frequency of Social Gatherings with Friends and Family: Not on file    Attends Religious Services: 1 to 4 times per year    Active Member of [employer] or Organizations: No    Attends Banker Meetings: Never    Marital Status: Married  Catering Manager Violence: Not At Risk (01/05/2024)   Humiliation, Afraid, Rape, and Kick questionnaire    Fear of Current or Ex-Partner: No    Emotionally Abused: No    Physically Abused: No    Sexually Abused: No     Family History  Problem Relation Age of Onset   Diabetes Mother    Cancer Father        lymphoma    ROS: [x]  Positive   [ ]  Negative   [ ]  All sytems reviewed and are negative  Cardiovascular: []  chest pain/pressure []  palpitations []  SOB lying flat []  DOE []  pain in legs while walking []  pain in legs at rest []  pain in legs at night []  non-healing ulcers []  hx of DVT []  swelling in legs  Pulmonary: []  productive cough []  asthma/wheezing []  home O2  Neurologic: []  weakness in []  arms []  legs []  numbness in []  arms []  legs []  hx of CVA []  mini stroke [] difficulty  speaking or slurred speech []  temporary loss of vision in one eye []  dizziness  Hematologic: []  hx of cancer []  bleeding problems []  problems with blood clotting easily  Endocrine:   []  diabetes []  thyroid disease  GI []   vomiting blood []  blood in stool  GU: []  CKD/renal failure []  HD--[]  M/W/F or []  T/T/S []  burning with urination []  blood in urine  Psychiatric: []  anxiety []  depression  Musculoskeletal: []  arthritis []  joint pain  Integumentary: []  rashes []  ulcers  Constitutional: []  fever []  chills   Physical Examination  Vitals:   01/10/24 0730 01/10/24 0800  BP:  (!) 129/49  Pulse: 91 89  Resp: 19 14  Temp: 97.6 F (36.4 C)   SpO2: 98% 98%   Body mass index is 38.93 kg/m.  General:  NAD Gait: Not observed HENT: WNL, normocephalic Pulmonary: normal non-labored breathing Cardiac: regular, without  Murmurs, rubs or gallops Abdomen:  soft, NT/ND Extremities: without ischemic changes Musculoskeletal: no muscle wasting or atrophy  Neurologic: 5 out of 5 strength in bilateral upper extremities.  Notable weakness in the right lower extremity 3 out of 5 and unable to lift leg off bed   CBC    Component Value Date/Time   WBC 14.1 (H) 01/10/2024 0504   RBC 2.70 (L) 01/10/2024 0504   HGB 8.4 (L) 01/10/2024 0504   HCT 26.5 (L) 01/10/2024 0504   PLT 217 01/10/2024 0504   MCV 98.1 01/10/2024 0504   MCH 31.1 01/10/2024 0504   MCHC 31.7 01/10/2024 0504   RDW 16.0 (H) 01/10/2024 0504   LYMPHSABS 1.3 10/21/2023 1134   MONOABS 0.9 10/21/2023 1134   EOSABS 0.6 (H) 10/21/2023 1134   BASOSABS 0.1 10/21/2023 1134    BMET    Component Value Date/Time   NA 130 (L) 01/10/2024 0504   NA 144 11/09/2012 0938   K 5.2 (H) 01/10/2024 0504   CL 96 (L) 01/10/2024 0504   CO2 18 (L) 01/10/2024 0504   GLUCOSE 221 (H) 01/10/2024 0504   BUN 49 (H) 01/10/2024 0504   BUN 31 (H) 11/09/2012 0938   CREATININE 6.90 (H) 01/10/2024 0504   CREATININE 1.68 (H)  09/03/2012 1009   CALCIUM  7.8 (L) 01/10/2024 0504   CALCIUM  8.5 (L) 05/06/2022 1030   GFRNONAA 8 (L) 01/10/2024 0504   GFRNONAA 44 (L) 09/03/2012 1009   GFRAA 64 11/09/2012 0938   GFRAA 51 (L) 09/03/2012 1009    COAGS: Lab Results  Component Value Date   INR 1.3 (H) 01/08/2024   INR 1.1 01/07/2024   INR 1.1 11/11/2022     Non-Invasive Vascular Imaging:    CTA reviewed with over 80% calcified left ICA stenosis   ASSESSMENT/PLAN: This is a 68 y.o. male with history of diabetes, hypertension, hyperlipidemia, ESRD admitted with an NSTEMI now POD#2 status post CABG that vascular surgery been consulted for symptomatic left carotid stenosis.  Apparently was recovering in the ICU yesterday and had some word finding difficulty and right sided weakness.  Known 80% left ICA stenosis.  CTA neck also showed advanced atherosclerotic disease in the left carotid bulb into the ICA with 80% stenosis.  Discussed with the patient that I certainly think his left carotid disease is symptomatic and likely the etiology for his neurologic changes.  Awaiting removal of pacer wires to get brain MRI and further stroke workup.  Discussed he needs left carotid revascularization and we will tentatively schedule for Monday pending his stroke workup and ongoing resuscitation in the ICU.  I discussed the options of carotid endarterectomy versus TCAR.  He is already on aspirin  Plavix  statin.  Likely left TCAR on Monday.  Lonni DOROTHA Gaskins, MD [facility] Office: [phone]  Lonni JINNY Gaskins

## 2024-01-10 NOTE — Progress Notes (Signed)
 Reported by RN that pt had more acute neuro decline for the last 30 min. Per CCM team, pt was at his baseline around 7:30-8am. He was able to talk, moving all extremities, with NIHSS = 0. He seemed to have some fluid overload after overnight IVF @ 100cc/hm, was put on CRRT today. Started to have some mild slurry speech, lethargy but still able to move RUE and RLE since. However, for the last 30 min, pt found to be more lethargic, severe dysarthria, left gaze preference but still cross midline. Less blinking to visual threat on the R, R facial droop with R hemiplegia. On my exm, pt R sided hemiplegia was improving, to RUE 3-/5 and RLE 3-/5. But still has severe dysarthia and R facial droop. Mild left gaze preference, followed some simple commands but not all of them, seems to have some R neglect also. NIHSS = 15  Discussed with CCM Dr. Gretta, will do CTA head and neck with perfusion. If there is salvagable tissue, will discuss with Dr Lester from IR regarding emergent management.   NIH Stroke Scale  Level Of Consciousness 0=Alert; keenly responsive 1=Arouse to minor stimulation 2=Requires repeated stimulation to arouse or movements to pain 3=postures or unresponsive 1  LOC Questions to Month and Age 29=Answers both questions correctly 1=Answers one question correctly or dysarthria/intubated/trauma/language barrier 2=Answers neither question correctly or aphasia 0  LOC Commands      -Open/Close eyes     -Open/close grip     -Pantomime commands if communication barrier 0=Performs both tasks correctly 1=Performs one task correctly 2=Performs neighter task correctly 1  Best Gaze     -Only assess horizontal gaze 0=Normal 1=Partial gaze palsy 2=Forced deviation, or total gaze paresis 1  Visual 0=No visual loss 1=Partial hemianopia 2=Complete hemianopia 3=Bilateral hemianopia (blind including cortical blindness) 1  Facial Palsy     -Use grimace if obtunded 0=Normal symmetrical movement 1=Minor  paralysis (asymmetry) 2=Partial paralysis (lower face) 3=Complete paralysis (upper and lower face) 2  Motor  0=No drift for 10/5 seconds 1=Drift, but does not hit bed 2=Some antigravity effort, hits  bed 3=No effort against gravity, limb falls 4=No movement 0=Amputation/joint fusion Right Arm 2     Leg 2    Left Arm 0     Leg 0  Limb Ataxia     - FNT/HTS 0=Absent or does not understand or paralyzed or amputation/joint fusion 1=Present in one limb 2=Present in two limbs 0  Sensory 0=Normal 1=Mild to moderate sensory loss 2=Severe to total sensory loss or coma/unresponsive 0  Best Language 0=No aphasia, normal 1=Mild to moderate aphasia 2=Severe aphasia 3=Mute, global aphasia, or coma/unresponsive 1  Dysarthria 0=Normal 1=Mild to moderate 2=Severe, unintelligible or mute/anarthric 0=intubated/unable to test 2  Extinction/Neglect 0=No abnormality 1=visual/tactile/auditory/spatia/personal inattention/Extinction to bilateral simultaneous stimulation 2=Profound neglect/extinction more than 1 modality  2  Total   15   Ary Cummins, MD PhD Stroke Neurology 01/10/2024 1:16 PM

## 2024-01-10 NOTE — Progress Notes (Signed)
 TCTS Evening Rounds:  Back from TCAR. Remains sedated on vent. Hemodynamics stable. On NE for BP support.  Starting CRRT to remove volume.

## 2024-01-10 NOTE — Progress Notes (Signed)
 NAME:  Timothy Holloway, MRN:  [id number], DOB:  14-Jan-1956, LOS: 5 ADMISSION DATE:  01/05/2024, CONSULTATION DATE:  01/08/24 REFERRING MD:  Raenelle POUR., CHIEF COMPLAINT:  s/p CABG   History of Present Illness:  Timothy Holloway is a 68 year old male with past medical history significant for ESRD on HD, T2DM, HTN, obesity, chronic anemia who presented to [facility] ED 01/05/24 after experiencing chest pain/pressure during dialysis. In the ED, troponins were elevated, he was started on a heparin  gtt and transferred to [facility] for a cardiac cath where he was found to have triple-vessel disease and CHF. TCTS to take patient for CABG 12/8. PCCM consulted for post-operative medical management.  OR course EBL: 1100 Cell saver 482 Products 0 Bypass time: 1hr 58 min  Clamp time 1hr 21 min   Pertinent Medical History:   Past Medical History:  Diagnosis Date   Anemia    Cataract    Chronic kidney disease (CKD), stage III (moderate) (HCC)    dr Faythe   Depression    Diabetes mellitus without complication (HCC)    DVT (deep venous thrombosis) (HCC)    as a child   ESRD on dialysis [facility])    M-W-F   Hyperlipidemia    Hypertension    Impotence    Insomnia    Obesity    Steal syndrome as complication of dialysis access    Significant Hospital Events: Including procedures, antibiotic start and stop dates in addition to other pertinent events   12/5: Transfer from [facility]->cath showed triple-vessel disease/CHF 12/8: iHD in am Pre-op   CABG w/ Dr. Shyrl, successfully extubated.  12/9 no distress. Weaning NE 12/10 Concerns for CVA vs TIA yesterday, however when MAPs improved,patient's neuro s/s improved   Interim History / Subjective:  Patient having no focal neuro deficits at this time  Patient following commands, alert and oriented x 4  Pacing wires intact, chest tube output minimal    Objective    Blood pressure (!) 129/49, pulse 89, temperature 97.6 F (36.4 C), temperature source Axillary,  resp. rate 14, height 6' (1.829 m), weight 130.2 kg, SpO2 98%. CVP:  [9 mmHg-71 mmHg] 15 mmHg  FiO2 (%):  [21 %] 21 %   Intake/Output Summary (Last 24 hours) at 01/10/2024 0858 Last data filed at 01/10/2024 0800 Gross per 24 hour  Intake 3763.33 ml  Output 180 ml  Net 3583.33 ml   Filed Weights   01/08/24 1208 01/09/24 0600 01/10/24 0500  Weight: 133.8 kg 135.3 kg 130.2 kg   Examination: General: acute on chronic older adult male, lying in ICU bed, in NAD  HEENT: Normocephalic, PERRLA intact, Pink MM  CV: s1,s2, RRR, pacing wires in place- back up rate 40, current HR 50s, chMRG, No JVD  pulm: clear, diminished, no distress on vent  Abs: bs active, soft  Extremities: B/L lower extremity edema- 1+, no deformity, moves all extremities on command Skin: no rash  Neuro: Rass 0, follows commands, cough reflex present  GU: intact   Resolved Problem List:  Post op Vent management  Assessment and Plan:   CVA vs TIA  L ICA Stenosis s/p CTA  Code stroke called on morning of 12/10 due to new onset of right sided weakness S/S improved once MAPs >70, suspect this is occurring due to low flow state in setting of ICA stenosis  - had another episode in afternoon after getting out of bed, had to utilize increase in V-pacing 80s-90s to assist with MAP.  EEG  negative for seizures  P: TCTs reaching out to neurovascular team in regards to intervention for Left ICA Stenosis  Continue Bed rest orders Will reach out to nephro to do CRRT instead of HD to prevent low flow state  Continue frequent neuro checks, neuro following appreciate assistance  Continue MAP goal 70-80, utilize levophed  gtt  Continue to keep pacing wires, and chest tubes per TCTs  Stop maintenance fluids  Midodrine  per TCTS today   S/p NSTEMI c/b acute systolic HF (pre-op  EF 30-35% intra-op reported as NML) w/  s/p CABG x3 w/ R SVG w/ 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 and pressor dependence  Plan P: Continue cardiac  telemetry Continue MAP goal as above due to L ICA stenosis, prevent low flow states  Continue pacing wires and chest tubes per TCTs  Continue bed rest for now Continue multimodal pain  LMWH changed to heparin  SQ due to renal  Recommend holding off BB for now  Continue ASA  Post-operative hypoxia w/ left basilar volume loss  P: Continue to wean FIo2 for O2 Sats >92%  Continue pulx ox  Aggressive pulmonary hygiene  Aspiration precautions  Speech therapy to evaluate  Encourage IS  Leukocytosis  Improving, suspect reactive  P: Continue to trend WBC and fever curve daily  Complete surgical abx ppx   Expected post-operative ABLA Expected post-operative consumptive thrombocytopenia  Hgb 8.4 from 8.6 P: Continue to monitor for signs of bleeding, minimal output per chest tube  Continue to transfuse for hgb < 8  Continue DVT ppx as above  Trend CBC daily   ESRD on HD w/ mild inc'd AGMA  MWF HD   P: Continue to trend renal function daily  Continue to monitor and optimize electrolytes daily Continue to monitor urine output Continue strict I/Os Continue Adequate renal perfusion  Avoid nephrotoxic agents  Nephro following appreciate assistance- plan for CRRT today to prevent low flow states   T2DM P: Restart SSI, continue basal- lantus  10 units daily  Continue goal of 140-180   Total Critical Care Time: 50 mins   Christian Halla Chopp AGACNP-BC    Pulmonary & Critical Care 01/10/2024, 9:25 AM  Please see [email or web address] for pager details.  From 7A-7P if no response, please call [phone]. After hours, please call ELink [phone].

## 2024-01-10 NOTE — Progress Notes (Signed)
 [address] [place] [employer] [address]             [phone]                2 Days Post-Op Procedure(s) (LRB): CORONARY ARTERY BYPASS GRAFTING (CABG) TIMES 3 USING LEFT INTERNAL MAMMARY ARTERY AND ENDOSCOPICALLY HARVESTED RIGHT GREATER SAPHENOUS VEIN (N/A) ECHOCARDIOGRAM, TRANSESOPHAGEAL (N/A)   Events: No events Stroke symptoms resolved CT negative _______________________________________________________________ Vitals: BP (!) 129/49 (BP Location: Left Arm)   Pulse 89   Temp 97.6 F (36.4 C) (Axillary)   Resp 14   Ht 6' (1.829 m)   Wt 130.2 kg   SpO2 98%   BMI 38.93 kg/m  Filed Weights   01/08/24 1208 01/09/24 0600 01/10/24 0500  Weight: 133.8 kg 135.3 kg 130.2 kg     - Neuro: alert NAD  - Cardiovascular: sinus   Drips: levo 8.   CVP:  [7 mmHg-71 mmHg] 15 mmHg  - Pulm: EWOB  ABG    Component Value Date/Time   PHART 7.337 (L) 01/08/2024 2222   PCO2ART 35.5 01/08/2024 2222   PO2ART 121 (H) 01/08/2024 2222   HCO3 19.0 (L) 01/08/2024 2222   TCO2 20 (L) 01/08/2024 2222   ACIDBASEDEF 6.0 (H) 01/08/2024 2222   O2SAT 68.6 01/09/2024 0412    - Abd: ND - Extremity: trace edema  .Intake/Output      12/09 0701 12/10 0700 12/10 0701 12/11 0700   P.O. 240    I.V. (mL/kg) 2717.7 (20.9) 779.9 (6)   Blood     NG/GT     IV Piggyback 299.9    Total Intake(mL/kg) 3257.7 (25) 779.9 (6)   Urine (mL/kg/hr) 30 (0)    Other     Blood     Chest Tube 190    Total Output 220    Net +3037.7 +779.9           _______________________________________________________________ Labs:    Latest Ref Rng & Units 01/10/2024    5:04 AM 01/09/2024    4:30 PM 01/09/2024    4:12 AM  CBC  WBC 4.0 - 10.5 K/uL 14.1  19.0  16.3   Hemoglobin 13.0 - 17.0 g/dL 8.4  8.8  8.6   Hematocrit 39.0 - 52.0 % 26.5  27.3  26.1   Platelets 150 - 400 K/uL 217  239  237       Latest Ref Rng & Units 01/10/2024    5:04 AM 01/09/2024    4:30 PM 01/09/2024    4:12 AM   CMP  Glucose 70 - 99 mg/dL 778  776  868   BUN 8 - 23 mg/dL 49  48  40   Creatinine 0.61 - 1.24 mg/dL 3.09  4.05  4.91   Sodium 135 - 145 mmol/L 130  129  134   Potassium 3.5 - 5.1 mmol/L 5.2  5.7  4.8   Chloride 98 - 111 mmol/L 96  97  100   CO2 22 - 32 mmol/L 18  18  15    Calcium  8.9 - 10.3 mg/dL 7.8  7.8  7.9     CXR: stable  _______________________________________________________________  Assessment and Plan: POD 2 s/p CABG  Neuro: pain controlled.  Vascular surgery consults, will need intervention to L carotid CV: MAP goal 65-70.  Added midodrine .  Pacer set to back up rate Pulm: IS, ambulation Renal: ESRD. CRRT today GI: on diet Heme: stable ID: afebrile  Endo: SSI Dispo: continue ICU care   Timothy Holloway 01/10/2024 8:18 AM

## 2024-01-10 NOTE — Evaluation (Signed)
 Clinical/Bedside Swallow Evaluation Patient Details  Name: Timothy Holloway MRN: [id number] Date of Birth: 12-Nov-1955  Today's Date: 01/10/2024 Time: SLP Start Time (ACUTE ONLY): 1050 SLP Stop Time (ACUTE ONLY): 1105 SLP Time Calculation (min) (ACUTE ONLY): 15 min  Past Medical History:  Past Medical History:  Diagnosis Date   Anemia    Cataract    Chronic kidney disease (CKD), stage III (moderate) (HCC)    dr Faythe   Depression    Diabetes mellitus without complication (HCC)    DVT (deep venous thrombosis) (HCC)    as a child   ESRD on dialysis (HCC)    M-W-F   Hyperlipidemia    Hypertension    Impotence    Insomnia    Obesity    Steal syndrome as complication of dialysis access    Past Surgical History:  Past Surgical History:  Procedure Laterality Date   A/V FISTULAGRAM Right 10/11/2022   Procedure: A/V Fistulagram;  Surgeon: Serene Gaile ORN, MD;  Location: [facility] INVASIVE [facility];  Service: Cardiovascular;  Laterality: Right;   AV FISTULA PLACEMENT Left 01/11/2022   Procedure: LEFT ARM ARTERIOVENOUS (AV) FISTULA CREATION;  Surgeon: Lanis Fonda BRAVO, MD;  Location: [facility] OR;  Service: Vascular;  Laterality: Left;  PERIPHERAL NERVE BLOCK   AV FISTULA PLACEMENT Right 07/26/2022   Procedure: RIGHT UPPER EXTREMITY ARTERIOVENOUS (AV) FISTULA CREATION;  Surgeon: Oris Krystal FALCON, MD;  Location: [facility] ORS;  Service: Vascular;  Laterality: Right;   CORONARY ARTERY BYPASS GRAFT N/A 01/08/2024   Procedure: CORONARY ARTERY BYPASS GRAFTING (CABG) TIMES 3 USING LEFT INTERNAL MAMMARY ARTERY AND ENDOSCOPICALLY HARVESTED RIGHT GREATER SAPHENOUS VEIN;  Surgeon: Shyrl Linnie KIDD, MD;  Location: [facility] OR;  Service: Open Heart Surgery;  Laterality: N/A;  second case   DIALYSIS/PERMA CATHETER INSERTION Right 10/23/2023   Procedure: DIALYSIS/PERMA CATHETER INSERTION;  Surgeon: Magda Debby SAILOR, MD;  Location: [facility];  Service: Cardiovascular;  Laterality: Right;   INSERTION OF DIALYSIS CATHETER Right  06/16/2022   Procedure: INSERTION OF RIGHT iNTERNAL Jugular TUNNELED DIALYSIS CATHETER;  Surgeon: Serene Gaile ORN, MD;  Location: [facility] OR;  Service: Vascular;  Laterality: Right;   KNEE SURGERY Left 01-31-2002   LEFT HEART CATH AND CORONARY ANGIOGRAPHY N/A 01/05/2024   Procedure: LEFT HEART CATH AND CORONARY ANGIOGRAPHY;  Surgeon: Jordan, Peter M, MD;  Location: [facility] INVASIVE CV LAB;  Service: Cardiovascular;  Laterality: N/A;   LIGATION OF ARTERIOVENOUS  FISTULA Left 06/16/2022   Procedure: LEFT ARM FISTULA LIGATION;  Surgeon: Serene Gaile ORN, MD;  Location: [facility] OR;  Service: Vascular;  Laterality: Left;   LIGATION OF ARTERIOVENOUS  FISTULA Right 11/08/2022   Procedure: LIGATION of RIGHT RADIOCEPHALIC FISTULA;  Surgeon: Sheree Penne Bruckner, MD;  Location: [facility] OR;  Service: Vascular;  Laterality: Right;   SPINE SURGERY     fusion of L4-L5   TEE WITHOUT CARDIOVERSION N/A 01/08/2024   Procedure: ECHOCARDIOGRAM, TRANSESOPHAGEAL;  Surgeon: Shyrl Linnie KIDD, MD;  Location: [facility] OR;  Service: Open Heart Surgery;  Laterality: N/A;   UPPER EXTREMITY ANGIOGRAPHY Right 11/03/2022   Procedure: Upper Extremity Angiography;  Surgeon: Gretta Bruckner PARAS, MD;  Location: [facility] INVASIVE CV LAB;  Service: Cardiovascular;  Laterality: Right;   HPI:  Timothy Holloway is a 68 y.o. male status post CABG on postop day 1 in ICU was noted to be having difficulty with words and right sided weakenss, improved. CT negative. No MRI due to pacer. ESRD on hemodialysis, hypertension, hyperlipidemia, diabetes,    Assessment /  Plan / Recommendation  Clinical Impression  Plan: SLP instrumental assessment of swallowing via FEES later today given high risk of aspiration.   SLP consulted by RN due to concern about pt swallowing. RN reported observed pt to cough with liquids in am, also noted no gag or cough elicted when suction applied to posterior oropharynx or during NTS suction. SLP additionally observed pt to have weak  volitional cough, distressed respirations due to fluid overload, severe dysphonia not consistent with brief intubation for surgery. Given clinical signs concerning for decreased airway protection and sensation as well as probable acute CVA and significant pt fragility s/p CABG, defered 3 oz water swallow and will proceed with instrumental assessment of swallowing - FEES for direct viusalization of airway during swallowing.   SLP Visit Diagnosis: Dysphagia, oropharyngeal phase (R13.12)    Aspiration Risk  Severe aspiration risk    Diet Recommendation           Other Recommendations       Swallow Evaluation Recommendations Recommendations: NPO Medication Administration: Whole meds with puree   Assistance Recommended at Discharge    Functional Status Assessment    Frequency and Duration            Prognosis Prognosis for improved oropharyngeal function: Good      Swallow Study   General HPI: Timothy Holloway is a 68 y.o. male status post CABG on postop day 1 in ICU was noted to be having difficulty with words and right sided weakenss, improved. CT negative. No MRI due to pacer. ESRD on hemodialysis, hypertension, hyperlipidemia, diabetes, Type of Study: Bedside Swallow Evaluation Diet Prior to this Study: NPO Temperature Spikes Noted: No Respiratory Status: Nasal cannula History of Recent Intubation: Yes Total duration of intubation (days): 1 days Date extubated: 01/08/24 Behavior/Cognition: Alert;Cooperative Oral Cavity Assessment: Within Functional Limits Oral Care Completed by SLP: Recent completion by staff Oral Cavity - Dentition: Adequate natural dentition Vision: Functional for self-feeding Self-Feeding Abilities: Needs assist Patient Positioning: Upright in bed Baseline Vocal Quality: Hoarse;Breathy Volitional Cough: Weak;Congested Volitional Swallow: Able to elicit    Oral/Motor/Sensory Function Overall Oral Motor/Sensory Function: Mild impairment Facial ROM:  Reduced right;Suspected CN VII (facial) dysfunction Facial Symmetry: Abnormal symmetry right;Suspected CN VII (facial) dysfunction Facial Strength: Within Functional Limits Lingual ROM: Within Functional Limits Lingual Symmetry: Within Functional Limits Lingual Strength: Within Functional Limits Lingual Sensation: Within Functional Limits Velum: Within Functional Limits Mandible: Within Functional Limits   Ice Chips     Thin Liquid Thin Liquid: Not tested    Nectar Thick Nectar Thick Liquid: Not tested   Honey Thick Honey Thick Liquid: Not tested   Puree Puree: Not tested   Solid     Solid: Not tested      Elizardo Chilson, Consuelo Fitch 01/10/2024,12:13 PM

## 2024-01-10 NOTE — Progress Notes (Signed)
 Back from OR after TCAR. Needs to lie flat for 1 hr after venous access removed.  D/w VVS- goal SBP 140-160; currently on NE 12. Needs plavix . Remain on profopol, get volume off with CRRT tonight, planning to extubate tomorrow morning. Needs a wakeup assessment for neuro check tonight ideally. D/w Nephro- can increase pull on CRRT to net negative 100-200cc/h overnight.  [name], DO 01/10/24 5:55 PM [facility]  For contact information, see Amion. If no response to pager, please call PCCM consult pager. After hours, 7PM- 7AM, please call Elink.

## 2024-01-10 NOTE — Anesthesia Procedure Notes (Signed)
 Procedure Name: Intubation Date/Time: 01/10/2024 3:34 PM  Performed by: Virgil Ee, CRNAPre-anesthesia Checklist: Patient identified, Patient being monitored, Timeout performed, Emergency Drugs available and Suction available Patient Re-evaluated:Patient Re-evaluated prior to induction Oxygen Delivery Method: Circle system utilized Preoxygenation: Pre-oxygenation with 100% oxygen Induction Type: IV induction Ventilation: Mask ventilation without difficulty Laryngoscope Size: Glidescope and 4 Grade View: Grade I Tube type: Oral Tube size: 7.5 mm Number of attempts: 1 Airway Equipment and Method: Stylet Placement Confirmation: ETT inserted through vocal cords under direct vision, positive ETCO2 and breath sounds checked- equal and bilateral Secured at: 23 cm Tube secured with: Tape Dental Injury: Teeth and Oropharynx as per pre-operative assessment  Difficulty Due To: Difficult Airway- due to reduced neck mobility and Difficult Airway- due to large tongue

## 2024-01-10 NOTE — Anesthesia Procedure Notes (Signed)
 Arterial Line Insertion Start/End12/11/2023 3:47 AM, 01/10/2024 3:52 PM Performed by: Epifanio Fallow, MD, anesthesiologist  Patient location: Pre-op . Preanesthetic checklist: patient identified, IV checked, site marked, risks and benefits discussed, surgical consent, monitors and equipment checked, pre-op  evaluation, timeout performed and anesthesia consent Lidocaine  1% used for infiltration Right, radial was placed Catheter size: 20 G Hand hygiene performed  and maximum sterile barriers used   Attempts: 1 Following insertion, dressing applied and Biopatch. Post procedure assessment: normal and unchanged  Patient tolerated the procedure well with no immediate complications.

## 2024-01-10 NOTE — Progress Notes (Signed)
 PHARMACY NOTE:  ANTIMICROBIAL RENAL DOSAGE ADJUSTMENT  Current antimicrobial regimen includes a mismatch between antimicrobial dosage and estimated renal function.  As per policy approved by the Pharmacy & Therapeutics and Medical Executive Committees, the antimicrobial dosage will be adjusted accordingly.  Current antimicrobial dosage:  Cefazolin  2gm IV q8h x 2  Indication: surgical prophylaxis  Renal Function:  Estimated Creatinine Clearance: 13.9 mL/min (A) (by C-G formula based on SCr of 6.9 mg/dL (H)). []      On intermittent HD, scheduled: [x]      On CRRT    Antimicrobial dosage has been changed to:  Cefazolin  2gm IV q12h x 2  Additional comments:   Thank you for allowing pharmacy to be a part of this patient's care.  Vito Ralph, PharmD, BCPS Please see amion for complete clinical pharmacist phone list 01/10/2024 6:17 PM

## 2024-01-10 NOTE — Progress Notes (Signed)
 [facility] Progress Note   Subjective:    MS worsens w/ low MAP's < 65 Plan is for CRRT to avoid large BP drops w/ regular HD  Objective Vitals:   01/10/24 0730 01/10/24 0800 01/10/24 1000 01/10/24 1100  BP:  (!) 129/49 127/75 (!) 167/51  Pulse: 91 89 69 (!) 52  Resp: 19 14 (!) 22 16  Temp: 97.6 F (36.4 C)   98.3 F (36.8 C)  TempSrc: Axillary   Oral  SpO2: 98% 98% 95% 97%  Weight:      Height:       Physical Exam General: alert, nad Heart: RRR Lungs: CTAB, nml WOB on RA Abdomen: soft, NTND Extremities: trace LE edema Dialysis Access: TDC  Neuro: A&O, some speech slurring, bilateral grip strength intact     Dialysis Orders: [facility] MWF 4h B350  133.5kg  TDC  Heparin  2000+ 1400u/hr Calcitriol 1.25 three times per week Mircera 90 mcg q 2 wks (last 12/1)    Assessment/Plan: NSTEMI - LHC 12/5 with severe 3V CAD. EF 30-35%. S/p CABG x 3 on 12/8.  Stroke like symptoms. Neuro consulted. No acute CVA on Head CT. Advanced carotid disease on CTA. Try to avoid MAPs < 65.  ESRD - HD MWF. Last HD Monday pre CABG. Plan is to start CRRT today to avoid BP drops w/ iHD for now.  Access- Using [facility]. H/o failed AVF x 2.  Volume - CVP 9-15, vasc congestion on CXR. Close to dry wt. Will attempt to pull 50- 100 cc/ hr w/ CRRT as tolerated.  Anemia - Hgb 8.6. follow post op trend. ESA recently dosed as outpatient. No iron with high ferritin.  Transfuse prn.  Nutrition - Renal diet with fluid restriction    Myer Fret  MD  [facility] 01/10/2024, 12:13 PM  Recent Labs  Lab 01/09/24 0412 01/09/24 1630 01/10/24 0504  HGB 8.6* 8.8* 8.4*  ALBUMIN  2.6*  --  2.5*  CALCIUM  7.9* 7.8* 7.8*  PHOS 5.8*  --  7.1*  CREATININE 5.08* 5.94* 6.90*  K 4.8 5.7* 5.2*    Inpatient medications:   stroke: early stages of recovery book   Does not apply Once   acetaminophen   1,000 mg Oral Q6H   Or   acetaminophen  (TYLENOL ) oral liquid 160 mg/5 mL  1,000 mg Per Tube Q6H   aspirin  EC   325 mg Oral Daily   Or   aspirin   324 mg Per Tube Daily   atorvastatin   80 mg Oral Daily   bisacodyl   10 mg Oral Daily   Or   bisacodyl   10 mg Rectal Daily   Chlorhexidine  Gluconate Cloth  6 each Topical Daily   docusate sodium   200 mg Oral Daily   [START ON 01/11/2024] heparin  injection (subcutaneous)  5,000 Units Subcutaneous Q8H   insulin  aspart  0-24 Units Subcutaneous Q4H   insulin  glargine  10 Units Subcutaneous Daily   midodrine   10 mg Oral Q8H   pantoprazole   40 mg Oral Daily   sodium chloride  flush  10-40 mL Intracatheter Q12H   sodium chloride  flush  3 mL Intravenous Q12H    albumin  human Stopped (01/09/24 0043)    ceFAZolin  (ANCEF ) IV Stopped (01/09/24 2023)   norepinephrine  (LEVOPHED ) Adult infusion 11 mcg/min (01/10/24 1200)   prismasol BGK 4/2.5 1,400 mL/hr at 01/10/24 1016   prismasol BGK 4/2.5 400 mL/hr at 01/10/24 1014   prismasol BGK 4/2.5 400 mL/hr at 01/10/24 1017   albumin  human, albuterol , dextrose ,  fentaNYL  (SUBLIMAZE ) injection, heparin , ondansetron  (ZOFRAN ) IV, mouth rinse, oxyCODONE , sodium chloride  flush, sodium chloride  flush, traMADol , zolpidem 

## 2024-01-10 NOTE — Progress Notes (Addendum)
 68 year old male postop day 2 status post CABG seen this morning with symptomatic left carotid stenosis over 80%.  Tentatively planning carotid revascularization on Monday.  Has had additional neurologic changes today with right-sided weakness.  Repeat CT imaging obtained with high-grade calcified left ICA stenosis.  IR does not feel they have anything to offer from a intracranial standpoint.  Concerned that his neurologic changes are related to flow-limiting lesion on the left.  I have discussed with neurology who feels he would benefit from revascularization urgently today.  Discussed with him and his wife he would probably benefit from carotid endarterectomy but will plan left TCAR with possible shockwave given his overall deconditioned state.  Have added him onto the OR schedule today.  1% stroke risk as we discussed but MRI pending to show the extent of his ischemic event.  Will require intubation in the OR under general anesthesia as we discussed.  Neurology feels he still has salvageable tissue.  Notable right-sided weakness that waxes and wanes with his blood pressure.  Lonni DOROTHA Gaskins, MD [facility] Office: [phone]   Lonni JINNY Gaskins

## 2024-01-10 NOTE — Progress Notes (Signed)
 SLP Cancellation Note  Patient Details Name: Timothy Holloway MRN: [id number] DOB: November 28, 1955   Cancelled treatment:       Reason Eval/Treat Not Completed: Medical issues which prohibited therapy. Arrived to perform FEES. Pt in the middle of code stroke. SLP instructed to defer for today. Will follow chart for new needs.    Jaley Yan, Consuelo Fitch 01/10/2024, 1:23 PM

## 2024-01-10 NOTE — Plan of Care (Signed)
 Problem: Education: Goal: Knowledge of General Education information will improve Description: Including pain rating scale, medication(s)/side effects and non-pharmacologic comfort measures Outcome: Progressing   Problem: Health Behavior/Discharge Planning: Goal: Ability to manage health-related needs will improve Outcome: Progressing   Problem: Clinical Measurements: Goal: Ability to maintain clinical measurements within normal limits will improve Outcome: Progressing Goal: Will remain free from infection Outcome: Progressing Goal: Diagnostic test results will improve Outcome: Progressing Goal: Respiratory complications will improve Outcome: Progressing Goal: Cardiovascular complication will be avoided Outcome: Progressing   Problem: Activity: Goal: Risk for activity intolerance will decrease Outcome: Progressing   Problem: Nutrition: Goal: Adequate nutrition will be maintained Outcome: Progressing   Problem: Coping: Goal: Level of anxiety will decrease Outcome: Progressing   Problem: Elimination: Goal: Will not experience complications related to bowel motility Outcome: Progressing Goal: Will not experience complications related to urinary retention Outcome: Progressing   Problem: Pain Managment: Goal: General experience of comfort will improve and/or be controlled Outcome: Progressing   Problem: Safety: Goal: Ability to remain free from injury will improve Outcome: Progressing   Problem: Skin Integrity: Goal: Risk for impaired skin integrity will decrease Outcome: Progressing   Problem: Education: Goal: Ability to describe self-care measures that may prevent or decrease complications (Diabetes Survival Skills Education) will improve Outcome: Progressing Goal: Individualized Educational Video(s) Outcome: Progressing   Problem: Coping: Goal: Ability to adjust to condition or change in health will improve Outcome: Progressing   Problem: Fluid  Volume: Goal: Ability to maintain a balanced intake and output will improve Outcome: Progressing   Problem: Health Behavior/Discharge Planning: Goal: Ability to identify and utilize available resources and services will improve Outcome: Progressing Goal: Ability to manage health-related needs will improve Outcome: Progressing   Problem: Metabolic: Goal: Ability to maintain appropriate glucose levels will improve Outcome: Progressing   Problem: Nutritional: Goal: Maintenance of adequate nutrition will improve Outcome: Progressing Goal: Progress toward achieving an optimal weight will improve Outcome: Progressing   Problem: Skin Integrity: Goal: Risk for impaired skin integrity will decrease Outcome: Progressing   Problem: Tissue Perfusion: Goal: Adequacy of tissue perfusion will improve Outcome: Progressing   Problem: Education: Goal: Understanding of CV disease, CV risk reduction, and recovery process will improve Outcome: Progressing Goal: Individualized Educational Video(s) Outcome: Progressing   Problem: Activity: Goal: Ability to return to baseline activity level will improve Outcome: Progressing   Problem: Cardiovascular: Goal: Ability to achieve and maintain adequate cardiovascular perfusion will improve Outcome: Progressing Goal: Vascular access site(s) Level 0-1 will be maintained Outcome: Progressing   Problem: Health Behavior/Discharge Planning: Goal: Ability to safely manage health-related needs after discharge will improve Outcome: Progressing   Problem: Education: Goal: Will demonstrate proper wound care and an understanding of methods to prevent future damage Outcome: Progressing Goal: Knowledge of disease or condition will improve Outcome: Progressing Goal: Knowledge of the prescribed therapeutic regimen will improve Outcome: Progressing Goal: Individualized Educational Video(s) Outcome: Progressing   Problem: Activity: Goal: Risk for activity  intolerance will decrease Outcome: Progressing   Problem: Cardiac: Goal: Will achieve and/or maintain hemodynamic stability Outcome: Progressing   Problem: Clinical Measurements: Goal: Postoperative complications will be avoided or minimized Outcome: Progressing   Problem: Respiratory: Goal: Respiratory status will improve Outcome: Progressing   Problem: Skin Integrity: Goal: Wound healing without signs and symptoms of infection Outcome: Progressing Goal: Risk for impaired skin integrity will decrease Outcome: Progressing   Problem: Urinary Elimination: Goal: Ability to achieve and maintain adequate renal perfusion and functioning will improve Outcome: Progressing  Problem: Education: Goal: Knowledge of disease or condition will improve Outcome: Progressing Goal: Knowledge of secondary prevention will improve (MUST DOCUMENT ALL) Outcome: Progressing Goal: Knowledge of patient specific risk factors will improve (DELETE if not current risk factor) Outcome: Progressing

## 2024-01-10 NOTE — Addendum Note (Signed)
 Addendum  created 01/10/24 1813 by Epifanio Fallow, MD   Child order released for a procedure order, Clinical Note Signed, Intraprocedure Blocks edited, LDA created via procedure documentation, SmartForm saved

## 2024-01-10 NOTE — Progress Notes (Signed)
 OT Cancellation Note  Patient Details Name: Timothy Holloway MRN: [id number] DOB: 13-Dec-1955   Cancelled Treatment:    Reason Eval/Treat Not Completed: Medical issues which prohibited therapy. Pt still with active bed rest orders. Per RN, plan is for pt to begin CRRT today; requesting for therapy to follow up tomorrow.  Antoine Vandermeulen C, OT  Acute Rehabilitation Services Office [phone] Secure chat preferred   Adrianne GORMAN Savers 01/10/2024, 10:03 AM

## 2024-01-10 NOTE — Anesthesia Preprocedure Evaluation (Addendum)
 Anesthesia Evaluation  Patient identified by MRN, date of birth, ID band Patient awake    Reviewed: Allergy & Precautions, NPO status , Patient's Chart, lab work & pertinent test results, reviewed documented beta blocker date and time   History of Anesthesia Complications Negative for: history of anesthetic complications  Airway Mallampati: II  TM Distance: >3 FB Neck ROM: Full    Dental  (+) Teeth Intact, Dental Advisory Given   Pulmonary neg pulmonary ROS   breath sounds clear to auscultation       Cardiovascular hypertension, + CAD, + Past MI and + CABG (POD2)   Rhythm:Regular Rate:Bradycardia     Neuro/Psych  PSYCHIATRIC DISORDERS  Depression    AMS 2/2 ICA stenosis     GI/Hepatic   Endo/Other  diabetes, Type 2    Renal/GU ARFRenal disease     Musculoskeletal   Abdominal   Peds  Hematology   Anesthesia Other Findings   Reproductive/Obstetrics                              Anesthesia Physical Anesthesia Plan  ASA: 4 and emergent  Anesthesia Plan: General   Post-op Pain Management:    Induction: Intravenous  PONV Risk Score and Plan: 2 and Ondansetron , Dexamethasone  and Treatment may vary due to age or medical condition  Airway Management Planned: Oral ETT  Additional Equipment: Arterial line and None  Intra-op Plan:   Post-operative Plan: Possible Post-op intubation/ventilation  Informed Consent:      Dental advisory given  Plan Discussed with: CRNA  Anesthesia Plan Comments:          Anesthesia Quick Evaluation

## 2024-01-10 NOTE — Code Documentation (Signed)
 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 On Apririn 325. Pt was last known to be at his baseline at 12:20, and his RN noted him to be weaker than usual on the right, with a leftward gaze at 12:35. Code stroke was activated.   Stroke team to pt's bedside. NIHSS 10. Pt drowsy, dysarthric, slight rt facial droop, rt arm drift, and bilateral leg weakness. Pt to CT with team. The following imaging was obtained: CTA/P.  Per Dr. Jerri pt is not eligible for TNK due to recent major surgery. Pt is not a candidate for IR due to no LVO.   Pt returned to 2 H. He will need q 2 NIHSS and VS. NPO until stroke swallowing screen obtained. Bedside handoff with RN Fonda complete.

## 2024-01-10 NOTE — Op Note (Signed)
 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  Ultrasound-guided access left common femoral vein for delivery of TCAR venous sheath 2.  Transcatheter placement of cervical carotid artery stent on the left including angioplasty with flow reversal for distal embolic protection (left TCAR)  Surgeon: Dr. Lonni DOROTHA Gaskins, MD  Assistant: Ahmed Holster, PA  Indications: 68 year old male seen this morning in consultation with symptomatic high-grade left internal carotid artery stenosis over 80%.  He had continued stroke symptoms throughout the day whenever his blood pressure dropped with right hemiparesis.  He underwent repeat CT imaging.  I spoke with neurology who recommended urgent revascularization today given his ongoing perfusion issue following CABG.  He presents after risk benefits discussed with him and his wife.  An assistant was needed given the complexity of the case and for cutdown and stent placement.  Findings: Ultrasound-guided access left common femoral vein for delivery of TCAR venous sheath.  Transverse incision above the left clavicle with cutdown on the common carotid artery.  Access to the common carotid through a pursestring with the working arterial sheath.  After active flow reversal the lesion was crossed in the internal carotid artery where there was a 99% calcified stenosis.  This was treated with 6 mm x 35 mm angioplasty balloon and then stented with a 10 mm x 40 mm Enroute stent.  We did postdilated with a 7 mm x 15 mm angioplasty balloon for >30% stenosis after stent placement.  Widely patent stent at completion.  Anesthesia: General  Details: Patient was taken to the operating room after informed consent was obtained.  Placed on the table supine position.  General endotracheal anesthesia was induced.  I did remove the central  lumen catheter from his left neck and held manual pressure until we had hemostasis.  The left neck and bilateral groins were then prepped and draped in standard sterile fashion.  Antibiotics were given and timeout performed.  Initially started in the left groin with ultrasound evaluated the common femoral vein, it was patent, an image was saved.  This was accessed with micro access needle placed a microwire and a micro sheath to tried to advance a Bentson wire but could not get the TCAR venous sheath to advance under fluoroscopic guidance exchanged for a Amplatz wire and the left common femoral vein up into the iliac vein for more support and was able to get the TCAR venous sheath in place.  This flushed and aspirated easily.  I then a transverse incision above the left clavicle and went through the platysma with Bovie cautery.  I divided between heads of the sternocleidomastoid and the left jugular vein was mobilized laterally in the wound and we dissected out the common carotid and preserved the vagus nerve.  The common carotid artery was then controlled with umbilical tape and vessel loop.  The patient was given 100 units per kg IV heparin .  Checked an ACT to maintain greater than 250.  I placed a 5-0 pursestring on the anterior wall the common carotid artery this was then accessed with micro access needle, placed a micro, and a micro sheath and got a carotid angiogram to identify the carotid bifurcation.  I then used a J-wire and upsized to the working arterial sheath and w were on passive flow reversal.  The sheath was then connected to the venous sheath in the groin with a filter.  We  got several carotid images to get a working view.  We then went on clamp for active flow reversal and confirmed we had good flow for distal embolic protection.  The lesion was crossed with a wire and then the internal carotid artery on the left was treated with a 6 mm x 35 mm angioplasty balloon and then stented with a 10 mm x 40  mm Enroute stent with the help of my assistant.  There was still some residual waist in the stent that we could see >30% given the amount of calcification.  We postdilated the stent with a 7 mm x 15 mm angioplasty balloon with excellent results.  After 2 more minutes flow reversal we had a widely patent stent.  The wire was removed and we came off clamp.  We removed the arterial sheath tied on the pursestring with good hemostasis.  The neck was irrigated out and we used Surgicel snow.  It was closed with 3-0 Vicryl 4-0 Monocryl and Dermabond.  Venous sheath in the groin was removed holding manual pressure.  Patient will remained intubated and taken back to the heart vascular ICU.  Complication: None  Condition: Critical  Lonni DOROTHA Gaskins, MD [facility] Office: [phone]   Lonni JINNY Gaskins

## 2024-01-11 ENCOUNTER — Encounter (HOSPITAL_COMMUNITY): Payer: Self-pay | Admitting: Vascular Surgery

## 2024-01-11 ENCOUNTER — Inpatient Hospital Stay (HOSPITAL_COMMUNITY)

## 2024-01-11 DIAGNOSIS — I5021 Acute systolic (congestive) heart failure: Secondary | ICD-10-CM | POA: Diagnosis not present

## 2024-01-11 DIAGNOSIS — Z95828 Presence of other vascular implants and grafts: Secondary | ICD-10-CM

## 2024-01-11 DIAGNOSIS — I214 Non-ST elevation (NSTEMI) myocardial infarction: Secondary | ICD-10-CM | POA: Diagnosis not present

## 2024-01-11 DIAGNOSIS — N186 End stage renal disease: Secondary | ICD-10-CM | POA: Diagnosis not present

## 2024-01-11 DIAGNOSIS — Z48812 Encounter for surgical aftercare following surgery on the circulatory system: Secondary | ICD-10-CM

## 2024-01-11 DIAGNOSIS — Z9862 Peripheral vascular angioplasty status: Secondary | ICD-10-CM

## 2024-01-11 DIAGNOSIS — Z992 Dependence on renal dialysis: Secondary | ICD-10-CM | POA: Diagnosis not present

## 2024-01-11 LAB — POCT I-STAT 7, (LYTES, BLD GAS, ICA,H+H)
Acid-base deficit: 4 mmol/L — ABNORMAL HIGH (ref 0.0–2.0)
Bicarbonate: 20.9 mmol/L (ref 20.0–28.0)
Calcium, Ion: 1.07 mmol/L — ABNORMAL LOW (ref 1.15–1.40)
HCT: 23 % — ABNORMAL LOW (ref 39.0–52.0)
Hemoglobin: 7.8 g/dL — ABNORMAL LOW (ref 13.0–17.0)
O2 Saturation: 98 %
Potassium: 4.5 mmol/L (ref 3.5–5.1)
Sodium: 133 mmol/L — ABNORMAL LOW (ref 135–145)
TCO2: 22 mmol/L (ref 22–32)
pCO2 arterial: 35 mmHg (ref 32–48)
pH, Arterial: 7.385 (ref 7.35–7.45)
pO2, Arterial: 99 mmHg (ref 83–108)

## 2024-01-11 LAB — RENAL FUNCTION PANEL
Albumin: 2.2 g/dL — ABNORMAL LOW (ref 3.5–5.0)
Albumin: 1.9 g/dL — ABNORMAL LOW (ref 3.5–5.0)
Anion gap: 9 (ref 5–15)
Anion gap: 10 (ref 5–15)
BUN: 34 mg/dL — ABNORMAL HIGH (ref 8–23)
BUN: 30 mg/dL — ABNORMAL HIGH (ref 8–23)
CO2: 23 mmol/L (ref 22–32)
CO2: 22 mmol/L (ref 22–32)
Calcium: 7.7 mg/dL — ABNORMAL LOW (ref 8.9–10.3)
Calcium: 7.5 mg/dL — ABNORMAL LOW (ref 8.9–10.3)
Chloride: 100 mmol/L (ref 98–111)
Chloride: 101 mmol/L (ref 98–111)
Creatinine, Ser: 4.5 mg/dL — ABNORMAL HIGH (ref 0.61–1.24)
Creatinine, Ser: 4.17 mg/dL — ABNORMAL HIGH (ref 0.61–1.24)
GFR, Estimated: 13 mL/min — ABNORMAL LOW (ref >60)
GFR, Estimated: 15 mL/min — ABNORMAL LOW (ref >60)
Glucose, Bld: 128 mg/dL — ABNORMAL HIGH (ref 70–99)
Glucose, Bld: 210 mg/dL — ABNORMAL HIGH (ref 70–99)
Phosphorus: 4.4 mg/dL (ref 2.5–4.6)
Phosphorus: 4.1 mg/dL (ref 2.5–4.6)
Potassium: 4.4 mmol/L (ref 3.5–5.1)
Potassium: 4.7 mmol/L (ref 3.5–5.1)
Sodium: 132 mmol/L — ABNORMAL LOW (ref 135–145)
Sodium: 133 mmol/L — ABNORMAL LOW (ref 135–145)

## 2024-01-11 LAB — GLUCOSE, CAPILLARY
Glucose-Capillary: 132 mg/dL — ABNORMAL HIGH (ref 70–99)
Glucose-Capillary: 127 mg/dL — ABNORMAL HIGH (ref 70–99)
Glucose-Capillary: 133 mg/dL — ABNORMAL HIGH (ref 70–99)
Glucose-Capillary: 156 mg/dL — ABNORMAL HIGH (ref 70–99)
Glucose-Capillary: 156 mg/dL — ABNORMAL HIGH (ref 70–99)
Glucose-Capillary: 152 mg/dL — ABNORMAL HIGH (ref 70–99)
Glucose-Capillary: 151 mg/dL — ABNORMAL HIGH (ref 70–99)
Glucose-Capillary: 144 mg/dL — ABNORMAL HIGH (ref 70–99)
Glucose-Capillary: 223 mg/dL — ABNORMAL HIGH (ref 70–99)
Glucose-Capillary: 209 mg/dL — ABNORMAL HIGH (ref 70–99)
Glucose-Capillary: 195 mg/dL — ABNORMAL HIGH (ref 70–99)

## 2024-01-11 LAB — CBC
HCT: 24.4 % — ABNORMAL LOW (ref 39.0–52.0)
Hemoglobin: 7.7 g/dL — ABNORMAL LOW (ref 13.0–17.0)
MCH: 30.6 pg (ref 26.0–34.0)
MCHC: 31.6 g/dL (ref 30.0–36.0)
MCV: 96.8 fL (ref 80.0–100.0)
Platelets: 226 K/uL (ref 150–400)
RBC: 2.52 MIL/uL — ABNORMAL LOW (ref 4.22–5.81)
RDW: 16.2 % — ABNORMAL HIGH (ref 11.5–15.5)
WBC: 12.5 K/uL — ABNORMAL HIGH (ref 4.0–10.5)
nRBC: 0.3 % — ABNORMAL HIGH (ref 0.0–0.2)

## 2024-01-11 LAB — MAGNESIUM: Magnesium: 2.6 mg/dL — ABNORMAL HIGH (ref 1.7–2.4)

## 2024-01-11 LAB — PHOSPHORUS: Phosphorus: 4.4 mg/dL (ref 2.5–4.6)

## 2024-01-11 LAB — TRIGLYCERIDES: Triglycerides: 156 mg/dL — ABNORMAL HIGH (ref <150)

## 2024-01-11 MED ORDER — ATORVASTATIN CALCIUM 80 MG PO TABS
80.0000 mg | ORAL_TABLET | Freq: Every day | ORAL | Status: DC
  Administered 2024-01-12 – 2024-01-18 (×7): 80 mg via ORAL
  Filled 2024-01-11 (×7): qty 1

## 2024-01-11 MED ORDER — MIDODRINE HCL 5 MG PO TABS
10.0000 mg | ORAL_TABLET | Freq: Three times a day (TID) | ORAL | Status: DC
  Administered 2024-01-11 – 2024-01-14 (×7): 10 mg via ORAL
  Filled 2024-01-11 (×8): qty 2

## 2024-01-11 MED ORDER — RENA-VITE PO TABS
1.0000 | ORAL_TABLET | Freq: Every day | ORAL | Status: DC
  Administered 2024-01-11 – 2024-01-17 (×7): 1 via ORAL
  Filled 2024-01-11 (×7): qty 1

## 2024-01-11 MED ORDER — INSULIN GLARGINE 100 UNIT/ML ~~LOC~~ SOLN
15.0000 [IU] | Freq: Every day | SUBCUTANEOUS | Status: DC
  Administered 2024-01-12: 15 [IU] via SUBCUTANEOUS
  Filled 2024-01-11 (×2): qty 0.15

## 2024-01-11 MED ORDER — DARBEPOETIN ALFA 100 MCG/0.5ML IJ SOSY
100.0000 ug | PREFILLED_SYRINGE | INTRAMUSCULAR | Status: DC
  Administered 2024-01-11: 100 ug via SUBCUTANEOUS
  Filled 2024-01-11 (×2): qty 0.5

## 2024-01-11 MED ORDER — OXYCODONE HCL 5 MG PO TABS: 5.0000 mg | ORAL_TABLET | ORAL | Status: DC | PRN

## 2024-01-11 MED ORDER — CLOPIDOGREL BISULFATE 75 MG PO TABS
75.0000 mg | ORAL_TABLET | Freq: Every day | ORAL | Status: DC
  Administered 2024-01-12 – 2024-01-18 (×7): 75 mg via ORAL
  Filled 2024-01-11 (×7): qty 1

## 2024-01-11 MED ORDER — DOCUSATE SODIUM 50 MG/5ML PO LIQD: 200.0000 mg | Freq: Every day | ORAL | Status: DC

## 2024-01-11 MED ORDER — ASPIRIN 81 MG PO TBEC
81.0000 mg | DELAYED_RELEASE_TABLET | Freq: Every day | ORAL | Status: DC
  Administered 2024-01-12 – 2024-01-18 (×7): 81 mg via ORAL
  Filled 2024-01-11 (×7): qty 1

## 2024-01-11 MED ORDER — INSULIN GLARGINE-YFGN 100 UNIT/ML ~~LOC~~ SOLN
15.0000 [IU] | Freq: Every day | SUBCUTANEOUS | Status: DC
  Administered 2024-01-11: 15 [IU] via SUBCUTANEOUS
  Filled 2024-01-11 (×2): qty 0.15

## 2024-01-11 MED ORDER — INSULIN ASPART 100 UNIT/ML IJ SOLN
2.0000 [IU] | INTRAMUSCULAR | Status: DC
  Administered 2024-01-11 (×2): 6 [IU] via SUBCUTANEOUS
  Administered 2024-01-11: 4 [IU] via SUBCUTANEOUS
  Administered 2024-01-11 – 2024-01-12 (×2): 2 [IU] via SUBCUTANEOUS
  Administered 2024-01-12 (×3): 6 [IU] via SUBCUTANEOUS
  Administered 2024-01-12: 2 [IU] via SUBCUTANEOUS
  Administered 2024-01-12: 4 [IU] via SUBCUTANEOUS
  Administered 2024-01-13 (×3): 6 [IU] via SUBCUTANEOUS
  Administered 2024-01-13 (×2): 4 [IU] via SUBCUTANEOUS
  Filled 2024-01-11: qty 4
  Filled 2024-01-11: qty 6
  Filled 2024-01-11: qty 4
  Filled 2024-01-11: qty 6
  Filled 2024-01-11: qty 5
  Filled 2024-01-11: qty 6
  Filled 2024-01-11: qty 2
  Filled 2024-01-11 (×4): qty 6
  Filled 2024-01-11: qty 4
  Filled 2024-01-11: qty 6
  Filled 2024-01-11: qty 2
  Filled 2024-01-11: qty 6

## 2024-01-11 MED ORDER — TRAMADOL HCL 50 MG PO TABS
50.0000 mg | ORAL_TABLET | Freq: Two times a day (BID) | ORAL | Status: DC | PRN
  Administered 2024-01-11 – 2024-01-15 (×3): 50 mg via ORAL
  Filled 2024-01-11 (×4): qty 1

## 2024-01-11 MED ORDER — OXYCODONE HCL 5 MG PO TABS
5.0000 mg | ORAL_TABLET | ORAL | Status: DC | PRN
  Administered 2024-01-11 – 2024-01-12 (×2): 5 mg via ORAL
  Filled 2024-01-11 (×2): qty 1

## 2024-01-11 MED FILL — Sodium Chloride IV Soln 0.9%: INTRAVENOUS | Qty: 1000 | Status: AC

## 2024-01-11 MED FILL — Electrolyte-R (PH 7.4) Solution: INTRAVENOUS | Qty: 3000 | Status: AC

## 2024-01-11 MED FILL — Heparin Sodium (Porcine) Inj 1000 Unit/ML: INTRAMUSCULAR | Qty: 30 | Status: AC

## 2024-01-11 MED FILL — Heparin Sodium (Porcine) Inj 1000 Unit/ML: INTRAMUSCULAR | Qty: 50 | Status: AC

## 2024-01-11 MED FILL — Calcium Chloride Inj 10%: INTRAVENOUS | Qty: 10 | Status: AC

## 2024-01-11 MED FILL — Sodium Bicarbonate IV Soln 8.4%: INTRAVENOUS | Qty: 50 | Status: AC

## 2024-01-11 NOTE — Progress Notes (Signed)
° °   °  [address] [place] [place] [address]             [phone]                1 Day Post-Op Procedures (LRB): TRANSCAROTID ARTERY REVASCULARIZATION (TCAR) (Left)   Events: Another code stroke called Taken down to IR --> OR with Dr. Gretta for TCAR Extubated this am _______________________________________________________________ Vitals: BP 125/78 (BP Location: Right Arm)   Pulse 78   Temp 98.8 F (37.1 C)   Resp 15   Ht 6' (1.829 m)   Wt (!) 136.4 kg   SpO2 98%   BMI 40.78 kg/m  Filed Weights   01/09/24 0600 01/10/24 0500 01/11/24 0500  Weight: 135.3 kg 130.2 kg (!) 136.4 kg     - Neuro: alert NAD.  Moving right side.  Following commands  - Cardiovascular: paced  Drips: none CVP:  [15 mmHg-27 mmHg] 24 mmHg  - Pulm: EWOB  ABG    Component Value Date/Time   PHART 7.385 01/11/2024 0750   PCO2ART 35.0 01/11/2024 0750   PO2ART 99 01/11/2024 0750   HCO3 20.9 01/11/2024 0750   TCO2 22 01/11/2024 0750   ACIDBASEDEF 4.0 (H) 01/11/2024 0750   O2SAT 98 01/11/2024 0750    - Abd: ND - Extremity: trace edema  .Intake/Output      12/10 0701 12/11 0700 12/11 0701 12/12 0700   P.O.     I.V. (mL/kg) 2389.6 (17.5) 37.5 (0.3)   IV Piggyback 100    Total Intake(mL/kg) 2489.6 (18.3) 37.5 (0.3)   Urine (mL/kg/hr) 500 (0.2)    Blood 50    Chest Tube 50    CRRT 3717.1 460   Total Output 4317.1 460   Net -1827.5 -422.5           _______________________________________________________________ Labs:    Latest Ref Rng & Units 01/11/2024    7:50 AM 01/11/2024    5:00 AM 01/10/2024    4:46 PM  CBC  WBC 4.0 - 10.5 K/uL  12.5    Hemoglobin 13.0 - 17.0 g/dL 7.8  7.7  8.5   Hematocrit 39.0 - 52.0 % 23.0  24.4  25.0   Platelets 150 - 400 K/uL  226        Latest Ref Rng & Units 01/11/2024    7:50 AM 01/11/2024    5:00 AM 01/10/2024    6:45 PM  CMP  Glucose 70 - 99 mg/dL  871  864   BUN 8 - 23 mg/dL  34  46   Creatinine 9.38 - 1.24 mg/dL   5.49  4.15   Sodium 864 - 145 mmol/L 133  132  131   Potassium 3.5 - 5.1 mmol/L 4.5  4.4  4.9   Chloride 98 - 111 mmol/L  100  100   CO2 22 - 32 mmol/L  23  19   Calcium  8.9 - 10.3 mg/dL  7.7  7.4     CXR: stable  _______________________________________________________________  Assessment and Plan: POD 3 s/p CABG  POD 1 s/p TCAR  Neuro: pain controlled.  Vascular surgery consults, will need intervention to L carotid CV: MAP goal 65-70.  Will keep wires for now Pulm: IS, ambulation Renal: ESRD. CRRT today GI: on diet Heme: stable.  On plavix  ID: afebrile Endo: SSI Dispo: continue ICU care   Timothy Holloway 01/11/2024 10:56 AM

## 2024-01-11 NOTE — Progress Notes (Addendum)
°  Progress Note    01/11/2024 8:22 AM 1 Day Post-Op  Subjective:  intubated but following commands   Vitals:   01/11/24 0656 01/11/24 0700  BP:    Pulse: 69 69  Resp: (!) 9 13  Temp: 99 F (37.2 C) 99 F (37.2 C)  SpO2: 100% 100%   Physical Exam: Lungs:  mechanical ventilation Incisions:  L neck c/d/i Extremities:  moving ext equally; palpable L DP, R PT Abdomen:  soft, NT, ND Neurologic: limited neuro exam but moving R and L side equally  CBC    Component Value Date/Time   WBC 12.5 (H) 01/11/2024 0500   RBC 2.52 (L) 01/11/2024 0500   HGB 7.8 (L) 01/11/2024 0750   HCT 23.0 (L) 01/11/2024 0750   PLT 226 01/11/2024 0500   MCV 96.8 01/11/2024 0500   MCH 30.6 01/11/2024 0500   MCHC 31.6 01/11/2024 0500   RDW 16.2 (H) 01/11/2024 0500   LYMPHSABS 1.3 10/21/2023 1134   MONOABS 0.9 10/21/2023 1134   EOSABS 0.6 (H) 10/21/2023 1134   BASOSABS 0.1 10/21/2023 1134    BMET    Component Value Date/Time   NA 133 (L) 01/11/2024 0750   NA 144 11/09/2012 0938   K 4.5 01/11/2024 0750   CL 100 01/11/2024 0500   CO2 23 01/11/2024 0500   GLUCOSE 128 (H) 01/11/2024 0500   BUN 34 (H) 01/11/2024 0500   BUN 31 (H) 11/09/2012 0938   CREATININE 4.50 (H) 01/11/2024 0500   CREATININE 1.68 (H) 09/03/2012 1009   CALCIUM  7.7 (L) 01/11/2024 0500   CALCIUM  8.5 (L) 05/06/2022 1030   GFRNONAA 13 (L) 01/11/2024 0500   GFRNONAA 44 (L) 09/03/2012 1009   GFRAA 64 11/09/2012 0938   GFRAA 51 (L) 09/03/2012 1009    INR    Component Value Date/Time   INR 1.3 (H) 01/08/2024 1905     Intake/Output Summary (Last 24 hours) at 01/11/2024 9177 Last data filed at 01/11/2024 0800 Gross per 24 hour  Intake 1732.71 ml  Output 4547.1 ml  Net -2814.39 ml     Assessment/Plan:  Timothy Holloway is s/p L TCAR 1 Day Post-Op   Intubated but following commands.  Moving R arm and leg this morning.  L neck incision well appearing without hematoma.    Continue aspirin  and plavix .  Plans noted for  extubation this morning.   Donnice Sender, PA-C [facility] [phone] 01/11/2024 8:22 AM  I have seen and evaluated the patient. I agree with the PA note as documented above.  Postop day 1 status post urgent left TCAR for symptomatic high-grade calcified stenosis with crescendo neurologic changes in the ICU after CABG.  Remains intubated in the ICU following CABG earlier on Monday.  Apparently was moving all extremities when sedation weaned last night.  Continue aspirin  Plavix  statin.  Neck incision and groin look good.  Lonni DOROTHA Gaskins, MD [facility] Office: [phone]

## 2024-01-11 NOTE — Inpatient Diabetes Management (Signed)
 Inpatient Diabetes Program Recommendations  AACE/ADA: New Consensus Statement on Inpatient Glycemic Control (2015)  Target Ranges:  Prepandial:   less than 140 mg/dL      Peak postprandial:   less than 180 mg/dL (1-2 hours)      Critically ill patients:  140 - 180 mg/dL   Lab Results  Component Value Date   GLUCAP 151 (H) 01/11/2024   HGBA1C 9.9 (H) 01/05/2024    Review of Glycemic Control  Latest Reference Range & Units 01/11/24 01:55 01/11/24 03:56 01/11/24 05:56 01/11/24 07:48 01/11/24 08:59 01/11/24 11:39  Glucose-Capillary 70 - 99 mg/dL 867 (H) 872 (H) 866 (H) 156 (H) 156 (H) 151 (H)   Diabetes history: DM 2 Outpatient Diabetes medications:  Lantus  50 units q HS, Humalog 10-20 units tid with meals  Current orders for Inpatient glycemic control:  Novolog  2-6 units q 4 hours Semglee  15 units daily   Inpatient Diabetes Program Recommendations:    Note patient extubated and transitioning off insulin  drip.  Home dose of basal insulin  was Lantus  50 units daily.  If CBG's increase, may consider increasing Semglee  to 10 unit bid.  Thanks,  Randall Bullocks, RN, BC-ADM Inpatient Diabetes Coordinator Pager [phone]  (8a-5p)

## 2024-01-11 NOTE — Procedures (Signed)
 Extubation Procedure Note  Patient Details:   Name: Timothy Holloway DOB: 11/16/1955 MRN: [id number]   Airway Documentation:    Vent end date: 01/11/24 Vent end time: 0825   Evaluation  O2 sats: stable throughout Complications: No apparent complications Patient did tolerate procedure well. Bilateral Breath Sounds: Diminished, Rhonchi   Patient extubated per written order with RT and RN at bedside. Cuff leak present prior to extubating, no stridor noted post. Patient placed on 4L [place] and tolerating well at this time.      Yes  Maryclaire Stoecker JINNY Isaias Brash 01/11/2024, 8:31 AM

## 2024-01-11 NOTE — Evaluation (Signed)
 Occupational Therapy Evaluation Patient Details Name: Timothy Holloway MRN: [id number] DOB: [birth year] Today's Date: 01/11/2024   History of Present Illness   68 yo M adm 12/5 to [facility] due to CP with troponin level 1121 and glucose 306. Workup for NSTEMI, transferred to [facility] 12/8 s/p CABGx3 12/9 facial droop R arm and leg weakness with hypotension with return to baseline incerase BP imaging negative for acute infarct,known L ICA stenosis. Started on CRRT 12/10. Code stroke re-activated 12/10 for increased R sided weakness. S/p emergent L TCAR 12/10. PMH ESRD on HD (MWF) DM2, obese, HTN HLD, depression, anemia insomnia CKD steal syndrome, R and L fistula ,back surg     Clinical Impressions Pt admitted based on above, and was seen based on problem list below. PTA pt was independent with ADLs and IADLs. Today pt is requiring set up  to max  assist for ADLs. For line safety, deferred supine to sit, instead transitioned bed to egress, to attempt STS. Unable to achieve full stand with max +2 assist. With standing trials, noted drop in BP and pt becoming more lethargic, however responding to commands. Deferred further standing trials or transfers for safety. PTA pt was active and independent, anticipate would progress well with intensive rehab. Recommending  >3 hours of skilled rehab daily. OT will continue to follow acutely to maximize functional independence.        If plan is discharge home, recommend the following:   Two people to help with walking and/or transfers;A lot of help with bathing/dressing/bathroom;Assistance with cooking/housework;Help with stairs or ramp for entrance     Functional Status Assessment   Patient has had a recent decline in their functional status and demonstrates the ability to make significant improvements in function in a reasonable and predictable amount of time.     Equipment Recommendations   Other (comment) (Defer to next venue)     Recommendations  for Other Services   Rehab consult     Precautions/Restrictions   Precautions Precautions: Fall;Sternal Precaution Booklet Issued: No Recall of Precautions/Restrictions: Intact Precaution/Restrictions Comments: CRRT, chest tube Restrictions Weight Bearing Restrictions Per Provider Order: No Other Position/Activity Restrictions: Sternal precautions     Mobility Bed Mobility Overal bed mobility: Needs Assistance Bed Mobility: Rolling Rolling: Max assist, +2 for safety/equipment, +2 for physical assistance         General bed mobility comments: Max +2 assist to roll for placement of pads. Use of bed egress feature d/t lines    Transfers Overall transfer level: Needs assistance Equipment used: 2 person hand held assist Transfers: Sit to/from Stand Sit to Stand: Max assist, +2 physical assistance, +2 safety/equipment           General transfer comment: Attempted x2 STS trials from EOB in egress position. Unable to clear hips with max +2 assist. Pt with soft BP and R lateral lean, deferred further attempt and returned to supine      Balance Overall balance assessment: Needs assistance Sitting-balance support: Bilateral upper extremity supported, Feet supported Sitting balance-Leahy Scale: Poor Sitting balance - Comments: R lateral lean, min to mod assist to correct         ADL either performed or assessed with clinical judgement   ADL Overall ADL's : Needs assistance/impaired Eating/Feeding: Set up   Grooming: Set up   Upper Body Bathing: Minimal assistance   Lower Body Bathing: Maximal assistance   Upper Body Dressing : Minimal assistance   Lower Body Dressing: Maximal assistance   General ADL Comments:  Limited d/t pt's soft BP with EOB     Vision Baseline Vision/History: 0 No visual deficits Patient Visual Report: No change from baseline Vision Assessment?: Yes Eye Alignment: Within Functional Limits Tracking/Visual Pursuits: Able to track  stimulus in all quads without difficulty Saccades: Within functional limits Convergence: Within functional limits Visual Fields: No apparent deficits            Pertinent Vitals/Pain Pain Assessment Pain Assessment: No/denies pain     Extremity/Trunk Assessment Upper Extremity Assessment Upper Extremity Assessment: Defer to OT evaluation   Lower Extremity Assessment Lower Extremity Assessment: Generalized weakness   Cervical / Trunk Assessment Cervical / Trunk Assessment: Kyphotic   Communication Communication Communication: No apparent difficulties   Cognition Arousal: Alert Behavior During Therapy: WFL for tasks assessed/performed Cognition: No apparent impairments       Following commands: Intact       Cueing  General Comments   Cueing Techniques: Verbal cues  Soft BP with attempted stand, BP 80s/40s. RN notified           Home Living Family/patient expects to be discharged to:: Private residence Living Arrangements: Spouse/significant other Available Help at Discharge: Family;Available PRN/intermittently Type of Home: House Home Access: Stairs to enter [employer] of Steps: 1 Entrance Stairs-Rails: None Home Layout: One level     Bathroom Shower/Tub: Chief Strategy Officer: Handicapped height     Home Equipment: Cane - quad          Prior Functioning/Environment Prior Level of Function : Independent/Modified Independent             Mobility Comments: Ind, no AD, 1x fall in last 6 months ADLs Comments: Ind    OT Problem List: Decreased strength;Decreased activity tolerance;Impaired balance (sitting and/or standing);Decreased knowledge of precautions;Cardiopulmonary status limiting activity   OT Treatment/Interventions: Self-care/ADL training;Therapeutic exercise;Therapeutic activities;Patient/family education;Balance training;Energy conservation;Neuromuscular education;DME and/or AE instruction      OT  Goals(Current goals can be found in the care plan section)   Acute Rehab OT Goals Patient Stated Goal: To get better OT Goal Formulation: With patient Time For Goal Achievement: 01/25/24 Potential to Achieve Goals: Good   OT Frequency:  Min 2X/week    Co-evaluation PT/OT/SLP Co-Evaluation/Treatment: Yes Reason for Co-Treatment: To address functional/ADL transfers;Complexity of the patient's impairments (multi-system involvement)   OT goals addressed during session: ADL's and self-care      AM-PAC OT 6 Clicks Daily Activity     Outcome Measure Help from another person eating meals?: None Help from another person taking care of personal grooming?: A Little Help from another person toileting, which includes using toliet, bedpan, or urinal?: Total Help from another person bathing (including washing, rinsing, drying)?: A Lot Help from another person to put on and taking off regular upper body clothing?: A Little Help from another person to put on and taking off regular lower body clothing?: A Lot 6 Click Score: 15   End of Session Nurse Communication: Mobility status  Activity Tolerance: Patient limited by fatigue Patient left: in bed;with call bell/phone within reach;with nursing/sitter in room  OT Visit Diagnosis: Unsteadiness on feet (R26.81);Other abnormalities of gait and mobility (R26.89);Muscle weakness (generalized) (M62.81)                Time: 8686-8642 OT Time Calculation (min): 44 min Charges:  OT General Charges $OT Visit: 1 Visit OT Evaluation $OT Eval Moderate Complexity: 1 Mod OT Treatments $Self Care/Home Management : 8-22 mins  Wandell Scullion C, OT  Acute  Rehabilitation Services Office [phone] Secure chat preferred   Adrianne GORMAN Savers 01/11/2024, 4:07 PM

## 2024-01-11 NOTE — Procedures (Signed)
 Objective Swallowing Evaluation: Type of Study: FEES-Fiberoptic Endoscopic Evaluation of Swallow   Patient Details  Name: Timothy Holloway MRN: [id number] Date of Birth: 16-Dec-1955  Today's Date: 01/11/2024 Time: SLP Start Time (ACUTE ONLY): 1410 -SLP Stop Time (ACUTE ONLY): 1447  SLP Time Calculation (min) (ACUTE ONLY): 37 min   Past Medical History:  Past Medical History:  Diagnosis Date   Anemia    Cataract    Chronic kidney disease (CKD), stage III (moderate) (HCC)    dr Faythe   Depression    Diabetes mellitus without complication (HCC)    DVT (deep venous thrombosis) (HCC)    as a child   ESRD on dialysis (HCC)    M-W-F   Hyperlipidemia    Hypertension    Impotence    Insomnia    Obesity    Steal syndrome as complication of dialysis access    Past Surgical History:  Past Surgical History:  Procedure Laterality Date   A/V FISTULAGRAM Right 10/11/2022   Procedure: A/V Fistulagram;  Surgeon: Serene Gaile ORN, MD;  Location: [facility] IN[facility]ASIVE [facility];  Service: Cardiovascular;  Laterality: Right;   AV FISTULA PLACEMENT Left 01/11/2022   Procedure: LEFT ARM ARTERIOVENOUS (AV) FISTULA CREATION;  Surgeon: Lanis Fonda BRAVO, MD;  Location: [facility] OR;  Service: Vascular;  Laterality: Left;  PERIPHERAL NERVE BLOCK   AV FISTULA PLACEMENT Right 07/26/2022   Procedure: RIGHT UPPER EXTREMITY ARTERIOVENOUS (AV) FISTULA CREATION;  Surgeon: Oris Krystal FALCON, MD;  Location: [facility] ORS;  Service: Vascular;  Laterality: Right;   CORONARY ARTERY BYPASS GRAFT N/A 01/08/2024   Procedure: CORONARY ARTERY BYPASS GRAFTING (CABG) TIMES 3 USING LEFT INTERNAL MAMMARY ARTERY AND ENDOSCOPICALLY HARVESTED RIGHT GREATER SAPHENOUS VEIN;  Surgeon: Shyrl Linnie KIDD, MD;  Location: [facility] OR;  Service: Open Heart Surgery;  Laterality: N/A;  second case   DIALYSIS/PERMA CATHETER INSERTION Right 10/23/2023   Procedure: DIALYSIS/PERMA CATHETER INSERTION;  Surgeon: Magda Debby SAILOR, MD;  Location: [facility];  Service:  Cardiovascular;  Laterality: Right;   INSERTION OF DIALYSIS CATHETER Right 06/16/2022   Procedure: INSERTION OF RIGHT iNTERNAL Jugular TUNNELED DIALYSIS CATHETER;  Surgeon: Serene Gaile ORN, MD;  Location: [facility] OR;  Service: Vascular;  Laterality: Right;   KNEE SURGERY Left 01-31-2002   LEFT HEART CATH AND CORONARY ANGIOGRAPHY N/A 01/05/2024   Procedure: LEFT HEART CATH AND CORONARY ANGIOGRAPHY;  Surgeon: Jordan, Peter M, MD;  Location: [facility] INVASIVE CV LAB;  Service: Cardiovascular;  Laterality: N/A;   LIGATION OF ARTERIOVENOUS  FISTULA Left 06/16/2022   Procedure: LEFT ARM FISTULA LIGATION;  Surgeon: Serene Gaile ORN, MD;  Location: [facility] OR;  Service: Vascular;  Laterality: Left;   LIGATION OF ARTERIOVENOUS  FISTULA Right 11/08/2022   Procedure: LIGATION of RIGHT RADIOCEPHALIC FISTULA;  Surgeon: Sheree Penne Bruckner, MD;  Location: [facility] OR;  Service: Vascular;  Laterality: Right;   SPINE SURGERY     fusion of L4-L5   TEE WITHOUT CARDIOVERSION N/A 01/08/2024   Procedure: ECHOCARDIOGRAM, TRANSESOPHAGEAL;  Surgeon: Shyrl Linnie KIDD, MD;  Location: [facility] OR;  Service: Open Heart Surgery;  Laterality: N/A;   TRANSCAROTID ARTERY REVASCULARIZATION  Left 01/10/2024   Procedure: TRANSCAROTID ARTERY REVASCULARIZATION (TCAR);  Surgeon: Gretta Bruckner PARAS, MD;  Location: [facility] OR;  Service: Vascular;  Laterality: Left;   UPPER EXTREMITY ANGIOGRAPHY Right 11/03/2022   Procedure: Upper Extremity Angiography;  Surgeon: Gretta Bruckner PARAS, MD;  Location: [facility] INVASIVE CV LAB;  Service: Cardiovascular;  Laterality: Right;   HPI: Timothy Holloway is a 68  y.o. male status post CABG on postop day 1 in ICU was noted to be having difficulty with words and right sided weakenss, improved. CT negative. No MRI due to pacer. ESRD on hemodialysis, hypertension, hyperlipidemia, diabetes,   No data recorded   Assessment / Plan / Recommendation     01/11/2024    2:00 PM  Clinical Impressions  Clinical Impression Plan:  Moderate dysphagia with risk of aspiration without sufficient cough. Multiple risk factors for aspiration pna. Risk of particulate matter residue in airway. Recommend full nectar thick liquids until voice, cough and activity tolerance improve  Pt demonstrates a moderate oropharyngeal dysphagia with impairments in airway protection/safety and efficiency. Pt is very dysphonic during exam, direct visualization shows standing secretions in pyriforms sinuses moving and out and glottis with movement (2 on [facility] Secretion Severity Scale). Pt has symmetrical arytenoid movement, but mild erythema on bilateral, posterior aspect of vocal folds, as well as incomplete glottic closure. Pt has decreased airway protection during the swallow with silent penetration and aspiration events with thin liquids, very late coughing observed. There was also decreased hyoid excursion with reduced PES opening. Pt had moderate residue in pyriforms and above PES and mild to moderate vallecular residue. Chin tuck and effortful swallow minimally beneficial. Liquid wash helpful, but only with nectar thick liquids. Multiple swallows still required. Pt recommended to consume a full, nectar thick liquid diet with pills given whole in puree, followed by sips of nectar. Reported findings to dietitian given potential for inability to meet caloric needs in acutely ill pt.  DIGEST Swallow Severity Rating*  Safety: 2  Efficiency:1  Overall Pharyngeal Swallow Severity: 2 1: mild; 2: moderate; 3: severe; 4: profound  *The Dynamic Imaging Grade of Swallowing Toxicity is standardized for the head and neck cancer population, however, demonstrates promising clinical applications across populations to standardize the clinical rating of pharyngeal swallow safety and severity.   SLP Visit Diagnosis Dysphagia, oropharyngeal phase (R13.12)  Attention and concentration deficit following --  Frontal lobe and executive function deficit following --  Impact  on safety and function Moderate aspiration risk         01/11/2024    2:00 PM  Treatment Recommendations  Treatment Recommendations Therapy as outlined in treatment plan below        01/11/2024    2:00 PM  Prognosis  Prognosis for improved oropharyngeal function Good  Barriers to Reach Goals --  Barriers/Prognosis Comment --    Swallow Evaluation Recommendations Recommendations: PO diet PO Diet Recommendation: Full liquid diet Liquid Administration via: Cup;Straw Medication Administration: Whole meds with puree Supervision: Staff to assist with self-feeding Postural changes: Position pt fully upright for meals Oral care recommendations: Oral care BID (2x/day)          01/11/2024    2:00 PM  Other Recommendations  Recommended Consults --  Oral Care Recommendations --  Caregiver Recommendations --  Follow Up Recommendations Acute inpatient rehab (3hours/day)  Assistance recommended at discharge --  Functional Status Assessment Patient has had a recent decline in their functional status and demonstrates the ability to make significant improvements in function in a reasonable and predictable amount of time.       01/11/2024    2:00 PM  Frequency and Duration   Speech Therapy Frequency (ACUTE ONLY) min 2x/week  Treatment Duration 2 weeks         01/11/2024    2:00 PM  Oral Phase  Oral Phase WFL  Oral - Pudding Teaspoon --  Oral - Pudding Cup --  Oral - Honey Teaspoon --  Oral - Honey Cup --  Oral - Nectar Teaspoon --  Oral - Nectar Cup --  Oral - Nectar Straw --  Oral - Thin Teaspoon --  Oral - Thin Cup --  Oral - Thin Straw --  Oral - Puree --  Oral - Mech Soft --  Oral - Regular --  Oral - Multi-Consistency --  Oral - Pill --  Oral Phase - Comment --       01/11/2024    2:00 PM  Pharyngeal Phase  Pharyngeal Phase Impaired  Pharyngeal- Pudding Teaspoon --  Pharyngeal --  Pharyngeal- Pudding Cup --  Pharyngeal --  Pharyngeal- Honey Teaspoon --   Pharyngeal --  Pharyngeal- Honey Cup --  Pharyngeal --  Pharyngeal- Nectar Teaspoon --  Pharyngeal --  Pharyngeal- Nectar Cup --  Pharyngeal --  Pharyngeal- Nectar Straw Delayed swallow initiation-vallecula;Reduced airway/laryngeal closure;Reduced anterior laryngeal mobility;Pharyngeal residue - valleculae;Pharyngeal residue - pyriform  Pharyngeal --  Pharyngeal- Thin Teaspoon --  Pharyngeal --  Pharyngeal- Thin Cup --  Pharyngeal --  Pharyngeal- Thin Straw Delayed swallow initiation-vallecula;Reduced airway/laryngeal closure;Reduced anterior laryngeal mobility;Pharyngeal residue - valleculae;Pharyngeal residue - pyriform;Penetration/Aspiration before swallow;Penetration/Aspiration during swallow  Pharyngeal Material enters airway, CONTACTS cords and not ejected out;Material enters airway, passes BELOW cords without attempt by patient to eject out (silent aspiration)  Pharyngeal- Puree Delayed swallow initiation-vallecula;Reduced anterior laryngeal mobility;Pharyngeal residue - pyriform;Pharyngeal residue - valleculae  Pharyngeal --  Pharyngeal- Mechanical Soft --  Pharyngeal --  Pharyngeal- Regular --  Pharyngeal --  Pharyngeal- Multi-consistency --  Pharyngeal --  Pharyngeal- Pill --  Pharyngeal --  Pharyngeal Comment --         No data to display           Salomon Ganser, Consuelo Fitch 01/11/2024, 3:14 PM

## 2024-01-11 NOTE — Progress Notes (Signed)
 NAME:  Timothy Holloway, MRN:  [id number], DOB:  [birth year], LOS: 6 ADMISSION DATE:  01/05/2024, CONSULTATION DATE:  01/08/24 REFERRING MD:  Raenelle POUR., CHIEF COMPLAINT:  s/p CABG   History of Present Illness:  Timothy Holloway is a 68 year old male with past medical history significant for ESRD on HD, T2DM, HTN, obesity, chronic anemia who presented to [facility] ED 01/05/24 after experiencing chest pain/pressure during dialysis. In the ED, troponins were elevated, he was started on a heparin  gtt and transferred to [facility] for a cardiac cath where he was found to have triple-vessel disease and CHF. TCTS to take patient for CABG 12/8. PCCM consulted for post-operative medical management.  OR course EBL: 1100 Cell saver 482 Products 0 Bypass time: 1hr 58 min  Clamp time 1hr 21 min   Pertinent Medical History:   Past Medical History:  Diagnosis Date   Anemia    Cataract    Chronic kidney disease (CKD), stage III (moderate) (HCC)    dr Faythe   Depression    Diabetes mellitus without complication (HCC)    DVT (deep venous thrombosis) (HCC)    as a child   ESRD on dialysis [facility])    M-W-F   Hyperlipidemia    Hypertension    Impotence    Insomnia    Obesity    Steal syndrome as complication of dialysis access    Significant Hospital Events: Including procedures, antibiotic start and stop dates in addition to other pertinent events   12/5: Transfer from [facility]->cath showed triple-vessel disease/CHF 12/8: iHD in am Pre-op   CABG w/ Dr. Shyrl, successfully extubated.  12/9 no distress. Weaning NE 12/10 Concerns for CVA vs TIA yesterday, however when MAPs improved 12/11 Code stroke called yesterday, right hemiplegia, right eye visual changes- neuro s/s not responding to MAP, CT perfusion scan completed- [name], came back intubated after procedure, [name]   Interim History / Subjective:  POD x1 s/p TCAR  Patient following commands with both extremities, slightly  weaker on right than left   Currently weaning on vent, planning to extubate   Objective    Blood pressure 125/78, pulse 73, temperature 98.8 F (37.1 C), resp. rate 16, height 6' (1.829 m), weight (!) 136.4 kg, SpO2 91%. CVP:  [15 mmHg-27 mmHg] 24 mmHg  Vent Mode: PSV;CPAP FiO2 (%):  [40 %-50 %] 40 % Set Rate:  [16 bmp] 16 bmp Vt Set:  [620 mL] 620 mL PEEP:  [5 cmH20] 5 cmH20 Pressure Support:  [8 cmH20] 8 cmH20 Plateau Pressure:  [22 cmH20-27 cmH20] 22 cmH20   Intake/Output Summary (Last 24 hours) at 01/11/2024 0859 Last data filed at 01/11/2024 0800 Gross per 24 hour  Intake 1732.71 ml  Output 4547.1 ml  Net -2814.39 ml   Filed Weights   01/09/24 0600 01/10/24 0500 01/11/24 0500  Weight: 135.3 kg 130.2 kg (!) 136.4 kg   Examination: General: acute on chronic older adult male, lying in icu bed on vent in NAD HEENT: Normocephalic, PERRLA intact, ETT, OG, Pink MM CV: s1,s2, RRR-HR 70 paced, no MRG, No JVD, chest tubes- 2 pleural, 1 mediastinal- minimal output, pacing wires in place- back up rate 70  pulm: clear, diminished, no distress on vent  Abs: bs active, soft  Extremities: no edema, no deformity, moves all extremities on command, RUE slightly weaker than LUE, able to move RLE, LLE on command Skin: no rash  Neuro: Rass 0 to -1 responds to painful stimuli, cough gag  reflex present  GU: deferred   Resolved Problem List:  Post op Vent management  Assessment and Plan:   L ICA Stenosis s/p TCAR-POD x 1 CVA vs TIA  Code stroke called on morning of 12/10 due to new onset of right sided weakness 12/9 S/S improved once MAPs >70, suspect this is occurring due to low flow state in setting of ICA stenosis had another episode in afternoon after getting out of bed, had to utilize increase in V-pacing 80s-90s to assist with MAP. EEG negative for seizures  12/10 code stroke called once again, patient having right hemiplegia, right visual changes, and dysarthria Neuro called to  bedside, CT perfusion scan completed, vascular team notified-decision to complete left TCAR to assist with left ICA perfusion, loaded with Plavix  after procedure.  Neurosymptoms improved after.  P: TCTS following, appreciate assistance  continue chest tube management and pacing wire management per TCTS Continue CRRT for now, with fluid removal goal 200/hr-patient tolerating well-Will discuss with nephrology if CRRT can continue throughout the day Continue frequent neurochecks, neurology following, appreciate assistance Continue midodrine  for TCTS Levophed  being transition off Continue aspirin  and Plavix  for vascular recommendations s/p TCAR Plan will be to extubate patient this morning  S/p NSTEMI c/b acute systolic HF (pre-op  EF 30-35% intra-op reported as NML) w/  s/p CABG x3 w/ R SVG w/ 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 and pressor dependence  Plan P: Continue cardiac telemetry Continue MAP goal greater than 65, since TCAR completed Continue pacing wires and chest tubes per T CTS Continue multimodal pain management Continue aspirin , continue Plavix  Continue heparin  subq for DVT prophylaxis  Postop intubation s/p TCAR Post-operative hypoxia w/ left basilar volume loss  P:  Continue ventilator support and lung protective strategies  Continue LTVV  Wean PEEP and Fio2 requirements to sat goal of >92%  HOB > 30 degrees Plat < 30  Aim for Driving pressures < 15  Intermittent Chest X-ray and ABGS VAP and PAD protocols in place  Wean sedation as tolerated, SBT and WUA daily -weaning right now will extubate Orders placed  Leukocytosis  Improving, suspect reactive  P: Continue to trend WBC/fever curve daily Continue surgical antibiotics PPx  Expected post-operative ABLA Expected post-operative consumptive thrombocytopenia  Hgb  7.8 < 8.4 <8.6, suspect some of this is from CRRT utilization  P: Continue to monitor for signs of bleeding, currently hemoglobin 7.7,-7.8, on CRRT BP  stable, coming off pressors, will hold transfusions at this point in time, continue to trend hemoglobin-repeat in a.m. Monitoring for signs of bleeding  ESRD on HD w/ mild inc'd AGMA  MWF HD  12/10-transition to CRRT to prevent low flow states in setting of left ICA stenosis P: Continue to trend renal function daily  Continue to monitor and optimize electrolytes daily Continue to monitor urine output Continue strict I/Os Continue Adequate renal perfusion  Avoid nephrotoxic agents  Nephro following patient assistance-patient has tolerated 200 mL/h of fluid volume removal, will need to discuss with nephrology if wants to continue CRT throughout the day or stop  T2DM Patient CBGs remain elevated on 12/10, placed on insulin  drip prior to TCAR procedure P: Will transition off insulin  drip to SSI and semglee15 units daily Continue CBG goal 140-180  Total Critical Care Time:  45  mins   Christian Jonanthan Bolender AGACNP-BC   [facility] 01/11/2024, 8:59 AM  Please see [email or web address] for pager details.  From 7A-7P if no response, please call [phone]. After hours, please call ELink [phone].

## 2024-01-11 NOTE — Progress Notes (Addendum)
 STROKE TEAM PROGRESS NOTE    SIGNIFICANT HOSPITAL EVENTS 12/5: Patient admitted to [facility] with chest pain and chest pressure during dialysis, cardiac cath revealed three-vessel disease, transferred here for further care 12/8: Three-vessel CABG performed 12/9: Code stroke activated for aphasia and right hemiparesis, symptoms noted to improve with head of bed flat and elevated MAP 12/10: Left-sided TCAR performed with 99% stenosis of left carotid noted  INTERIM HISTORY/SUBJECTIVE  Patient continues to require Levophed  to maintain blood pressure within parameters.  He has been extubated after his surgery and is doing well from a stroke perspective.  OBJECTIVE  CBC    Component Value Date/Time   WBC 12.5 (H) 01/11/2024 0500   RBC 2.52 (L) 01/11/2024 0500   HGB 7.8 (L) 01/11/2024 0750   HCT 23.0 (L) 01/11/2024 0750   PLT 226 01/11/2024 0500   MCV 96.8 01/11/2024 0500   MCH 30.6 01/11/2024 0500   MCHC 31.6 01/11/2024 0500   RDW 16.2 (H) 01/11/2024 0500   LYMPHSABS 1.3 10/21/2023 1134   MONOABS 0.9 10/21/2023 1134   EOSABS 0.6 (H) 10/21/2023 1134   BASOSABS 0.1 10/21/2023 1134    BMET    Component Value Date/Time   NA 133 (L) 01/11/2024 0750   NA 144 11/09/2012 0938   K 4.5 01/11/2024 0750   CL 100 01/11/2024 0500   CO2 23 01/11/2024 0500   GLUCOSE 128 (H) 01/11/2024 0500   BUN 34 (H) 01/11/2024 0500   BUN 31 (H) 11/09/2012 0938   CREATININE 4.50 (H) 01/11/2024 0500   CREATININE 1.68 (H) 09/03/2012 1009   CALCIUM  7.7 (L) 01/11/2024 0500   CALCIUM  8.5 (L) 05/06/2022 1030   GFRNONAA 13 (L) 01/11/2024 0500   GFRNONAA 44 (L) 09/03/2012 1009    IMAGING past 24 hours DG Abd Portable 1V Result Date: 01/10/2024 CLINICAL DATA:  OG tube placement EXAM: PORTABLE ABDOMEN - 1 VIEW COMPARISON:  01/08/2024 FINDINGS: Sternotomy. Lower chest drainage catheters. Enteric tube tip overlies the proximal stomach. Mild to moderate air distension of the colon. IMPRESSION: Enteric tube tip  overlies the proximal stomach. Electronically Signed   By: Luke Bun M.D.   On: 01/10/2024 18:38   HYBRID OR IMAGING ([facility] ONLY) Result Date: 01/10/2024 There is no interpretation for this exam.  This order is for images obtained during a surgical procedure.  Please See Surgeries Tab for more information regarding the procedure.   CT ANGIO HEAD NECK W WO CM W PERF (CODE STROKE) Result Date: 01/10/2024 EXAM: CT HEAD WITHOUT CONTRAST CT ANGIOGRAPHY OF THE HEAD AND NECK CT PERFUSION BRAIN 01/10/2024 01:32:03 PM TECHNIQUE: Contiguous axial images were obtained from the base of the skull through the vertex without intravenous contrast. Multidetector CT imaging of the head and neck was performed using the standard protocol during bolus administration of intravenous contrast. 3D postprocessing with multiplanar reconstructions and MIPs was performed to evaluate the vascular anatomy. Carotid stenosis measurements (when applicable) are obtained utilizing NASCET criteria, using the distal internal carotid diameter as the denominator. Cerebral perfusion analysis using computed tomography with contrast administration, including post-processing of parametric maps with determination of cerebral blood flow, cerebral blood volume, mean transit time and time-to-maximum. RADIATION DOSE REDUCTION: This exam was performed according to the departmental dose-optimization program which includes automated exposure control, adjustment of the mA and/or kV according to patient size and/or use of iterative reconstruction technique. CONTRAST: Without and with IV contrast. 100 mL (iohexol  (OMNIPAQUE ) 350 MG/ML injection 100 mL IOHEXOL  350 MG/ML SOLN). COMPARISON: Head CT  and CTA 01/09/2024. CLINICAL HISTORY: Neuro deficit, acute, stroke suspected. Right facial droop with right hemiplegia. Left gaze preference. Dysarthria. FINDINGS: CT HEAD: BRAIN AND VENTRICLES: There is no evidence of an acute infarct, intracranial hemorrhage, mass,  midline shift, hydrocephalus, or extra-axial fluid collection. Cerebral volume is normal. Calcified atherosclerosis at the skull base. ORBITS: Bilateral cataract extraction. SINUSES AND MASTOIDS: Extensive circumferential mucosal thickening in the right maxillary sinus. Left maxillary sinus mucous retention cyst. Clear mastoid air cells. CTA NECK: AORTIC ARCH AND ARCH VESSELS: Mild atherosclerotic calcification in the aortic arch. Moderate calcified plaque at the right subclavian artery origin resulting in mild stenosis. No dissection or arterial injury. CERVICAL CAROTID ARTERIES: Prominent calcified plaque about the right greater than left carotid bifurcations results in approximately 60% stenosis of the right ICA origin and 80% stenosis of the left ICA origin, unchanged. Diffusely diminished caliber of the more distal left ICA may reflect a combination of reduced flow and congenitally decreased size due to mild hypoplasia of the left A1 segment. No dissection or arterial injury. CERVICAL VERTEBRAL ARTERIES: The vertebral arteries are patent in the neck, however overall bolus timing and arterial opacification are worse than on yesterday's CTA. Both vertebral artery origins are poorly evaluated due to shoulder and motion artifact as well as calcified plaque on the right which likely results in severe stenosis. There is additional unchanged severe stenosis of the more distal right V1 segment. There are also moderate to severe stenoses of the right V2 and V3 segments which are similar to the prior study with up to mild stenosis on the left. LUNGS AND MEDIASTINUM: Partially visualized patchy consolidation in the right upper lobe, mildly progressed from prior. Postoperative changes from recent CABG. Right jugular port-a-cath. SOFT TISSUES: No acute abnormality. BONES: Advanced cervical spondylosis and facet arthrosis. CTA HEAD: ANTERIOR CIRCULATION: The intracranial internal carotid arteries are patent with similar  appearance of calcific siphon atherosclerosis resulting in up to moderate stenosis bilaterally. ACAs and MCAs are patent without evidence of a proximal branch occlusion or high grade proximal stenosis. No aneurysm. POSTERIOR CIRCULATION: The intracranial left vertebral artery is patent and diffusely small with calcified plaque resulting in moderate stenosis proximally. The intracranial right vertebral artery is heavily diseased with mixed calcified and soft plaque resulting in severe stenosis and with overall poor opacification throughout the majority of the V4 segment which reflects a change from yesterday's CTA although the vessel does not appear to be completely occluded. The basilar artery is patent and also appears overall less well opacified than on yesterday's CTA with an apparent new mild stenosis in its midportion, and this may reflect differences in bolus timing. There is a fetal origin of the right PCA. Both PCAs are patent without evidence of a high grade proximal stenosis. No aneurysm. OTHER: No dural venous sinus thrombosis on this non-dedicated study. CT PERFUSION: EXAM QUALITY: Mild motion artifact and poor bolus timing on the perfusion study with poor arterial input function. CORE INFARCT (CBF<30% volume): 0 mL TOTAL HYPOPERFUSION (Tmax>6s volume): 461 mL, located throughout the posterior fossa and left greater than right cerebral hemispheres and considered artifactual. [place] Stroke Program Early CT Score (ASPECTS) ----- Ganglionic (caudate, IC, lentiform nucleus, insula, M1-M3): 7 Supraganglionic (M4-M6): 3 Total: 10 IMPRESSION: 1. No evidence of an acute infarct or hemorrhage.  ASPECTS of 10. 2. Overall suboptimal bolus timing on the CTA compared to yesterday's study without evidence of a large vessel occlusion in the anterior circulation. 3. Heavily diseased right vertebral artery including particularly  poor enhancement of the right V4 segment compared to yesterday's CTA. This may reflect in  part suboptimal bolus timing, with interval truly decreased flow related to severe stenosis or nonocclusive thrombus not excluded. 4. Unchanged approximately 60% stenosis of the right ICA origin and 80% stenosis of the left ICA origin. 5. Nondiagnostic CT perfusion. 6. Progressive partially visualized right upper lobe consolidation suspicious for pneumonia. 7. These results were communicated to Dr. DOROTHA Cummins at 1:55 PM on January 10, 2024 by secure text page via the [facility] messaging system. Electronically signed by: Dasie Hamburg MD 01/10/2024 02:34 PM EST RP Workstation: [id number]    Vitals:   01/11/24 1115 01/11/24 1130 01/11/24 1145 01/11/24 1200  BP:    114/62  Pulse: 70 71 70 70  Resp: 16 15 15 17   Temp:      TempSrc:      SpO2: 100% 98% 98% 98%  Weight:      Height:         PHYSICAL EXAM General: Ill-appearing elderly patient in no acute distress Psych:  Mood and affect appropriate for situation CV: Regular, paced rate and rhythm on monitor Respiratory: Respirations regular and unlabored on supplemental O2  NEURO:  Mental Status: AA&Ox3, patient is able to give some history Speech/Language: speech is without dysarthria or or overt aphasia, naming intact, but paucity of speech noted due to hoarseness of throat postextubation  Cranial Nerves:  II: PERRL.   III, IV, VI: EOMI. Eyelids elevate symmetrically.  V: Sensation is intact to light touch and symmetrical to face.  VII: Face is symmetrical resting and smiling VIII: hearing intact to voice. IX, X: Phonation is normal.  KP:Dynloizm shrug 5/5. XII: tongue is midline without fasciculations. Motor: Able to move all 4 extremities with antigravity strength, but right arm weaker than left and subtle drift present in right arm Tone: is normal and bulk is normal Sensation- Intact to light touch bilaterally.  Coordination: FTN intact bilaterally  Most Recent NIH  1a Level of Conscious.: 0 1b LOC Questions: 0 1c LOC Commands: 0 2  Best Gaze: 0 3 Visual: 0 4 Facial Palsy: 0 5a Motor Arm - left: 0 5b Motor Arm - Right: 1 6a Motor Leg - Left: 0 6b Motor Leg - Right: 0 7 Limb Ataxia: 0 8 Sensory: 0 9 Best Language: 0 10 Dysarthria: 0 11 Extinct. and Inatten.: 0 TOTAL: 1   ASSESSMENT/PLAN  Timothy Holloway is a 68 y.o. male with history of CAD, end-stage renal disease on dialysis, hypertension, hyperlipidemia and diabetes who was originally admitted for chest pain and pressure, was transferred here for CABG, underwent surgery and was postoperatively found to have aphasia and right hemiparesis.  Symptoms improved when head of bed was flat and perfusion was ensured with MAP greater than 70, and patient was also found to have 80% stenosis of left carotid artery.  Overnight, he developed fluid overload and respiratory distress and is now being placed on CRRT.  MRI is pending and cannot be performed until pacer wires have been removed.  Yesterday, patient underwent urgent TCAR of left carotid after fluctuations in symptoms were noted, which was found to have 99% calcified stenosis.  Stroke: Likely left MCA infarct due to large vessel disease source of high grade left ICA stenosis Code Stroke CT head No acute abnormality. ASPECTS 10.    CTA head & neck 80% stenosis of left ICA, 50% stenosis of right ICA, moderate to severe stenosis of right vertebral artery V2  segment, moderate stenosis of right vertebral artery V3 V4 segments, fetal right PCA MRI not able to perform now just s/p CABG, will do with pacer wires removed 2D Echo hypokinesis of distal anteroseptal wall and apex, EF 40 to 45%  LDL 33 HgbA1c 9.9 VTE prophylaxis - subcutaneous heparin  aspirin  81 mg daily prior to admission, now on aspirin  325 mg daily, plan to start Plavix  after pacer wires removed Therapy recommendations:  CIR Disposition: Pending  Hypertension Home meds: Hydralazine  100 mg twice daily, hydrochlorothiazide  25 mg daily, losartan  100 mg  daily, clonidine  0.1 mg 2 times daily, carvedilol  6.25 mg twice daily BP stable on the low end Off Levophed  On midodrine  10 mg 3 times daily Avoid low BP Long-term BP goal normotensive  Left ICA stenosis Patient found to have 80% stenosis of left ICA on CT angiogram CUS left ICA 80-99% stenosis, R ICA 40-59% stenosis TCAR performed 12/10 with Dr. Gretta VVS  Hyperlipidemia Home meds: Atorvastatin  10 mg daily, increased to 80 per primary team LDL 33, goal < 70 Continue statin at discharge  Diabetes type II poorly controlled Home meds: None HgbA1c 9.9, goal < 7.0 CBGs SSI Recommend close follow-up with PCP for better DM control  Dysphagia Patient has post-stroke dysphagia and dysphonia  SLP consulted On nectar thick liquid Advance diet as tolerated  CAD post three-vessel CABG Patient underwent CABG on 12/8 Postoperative care per cardiothoracic surgery  Respiratory distress/fluid overload ESRD Patient noted to have labored respirations with wet lung sounds this morning Continue CRRT   Other Stroke Risk Factors Obesity, Body mass index is 40.78 kg/m., BMI >/= 30 associated with increased stroke risk, recommend weight loss, diet and exercise as appropriate  Congestive heart failure  Other Active Problems Leukocytosis WBC 16.3--19.0--14.1--12.5 Anemia of chronic disease Hb 8.5--8.8--8.4--7.7  Hospital day # 6  Patient seen by NP and then by MD, MD to edit note as needed. Cortney E Everitt Clint Kill , MSN, AGACNP-BC [facility] See Amion for schedule and pager information 01/11/2024 12:29 PM  ATTENDING NOTE: I reviewed above note and agree with the assessment and plan. Pt was seen and examined.   Speech therapy at bedside.  Patient eyes open, reclined in bed, clinically stable.  Awake, alert, orientated to age, place, time and people. No aphasia however moderate dysarthria with dysphonia and hoarseness, following all simple commands. Able to name and repeat   in dysarthric voice. No gaze palsy, tracking bilaterally, visual field full.  Slight right facial droop. Tongue midline. RUE 4/5, LUE 5/5, BLEs proximal 3/5 and distal 4/5. Sensation symmetrical bilaterally, no sensory neglect, b/l FTN intact but slow, gait not tested.   For detailed assessment and plan, please refer to above as I have made changes wherever appropriate.   Ary Cummins, MD PhD Stroke Neurology 01/11/2024 7:15 PM  This patient is critically ill due to CAD s/p CABG, ESRD, L MCA stroke, left carotid high grade stenosis and at significant risk of neurological worsening, death form recurrent stroke hemorrhagic conversion, heart failure, seizure. This patient's care requires constant monitoring of vital signs, hemodynamics, respiratory and cardiac monitoring, review of multiple databases, neurological assessment, discussion with family, other specialists and medical decision making of high complexity. I spent 35 minutes of neurocritical care time in the care of this patient.  I discussed with Dr. Gretta VVS and speech therapist.  To contact Stroke Continuity provider, please refer to [email or web address]. After hours, contact General Neurology

## 2024-01-11 NOTE — Progress Notes (Signed)
 OT Cancellation Note  Patient Details Name: Timothy Holloway MRN: [id number] DOB: 11/30/1955   Cancelled Treatment:    Reason Eval/Treat Not Completed: Other (comment). Pt scheduled to be extubated in the AM; RN requesting therapy to follow up in PM. Will follow up as schedule allows.   Kresta Templeman C, OT  Acute Rehabilitation Services Office [phone] Secure chat preferred   Adrianne GORMAN Savers 01/11/2024, 7:59 AM

## 2024-01-11 NOTE — Evaluation (Signed)
 Physical Therapy Evaluation Patient Details Name: Timothy Holloway MRN: [id number] DOB: [birth year] Today's Date: 01/11/2024  History of Present Illness  68 yo M adm 12/5 to [facility] due to CP with troponin level 1121 and glucose 306. Workup for NSTEMI, transferred to [facility] 12/8 s/p CABGx3 12/9 facial droop R arm and leg weakness with hypotension with return to baseline incerase BP imaging negative for acute infarct,known L ICA stenosis. Started on CRRT 12/10. Code stroke re-activated 12/10 for increased R sided weakness. S/p emergent L TCAR 12/10. PMH ESRD on HD (MWF) DM2, obese, HTN HLD, depression, anemia insomnia CKD steal syndrome, R and L fistula ,back surg  Clinical Impression  PTA pt independent with ambulation without AD, ADLs and iADLs. Pt is currently limited in safe mobility by sternal precautions, CRRT, low BP with upright and exertion, and generalized weakness. Pt is currently maxAx2 for rolling for pad placement and total Ax2 for 2x attempt to come to standing from egress position of bed. Pt A line BP in 80s/40s and decreased alertness with second attempt to come to standing. Pt returned to supine and BP and ability to converse with therapist improved. RN in room at end of session and alerted to response to positional change. Patient will benefit from intensive inpatient follow-up therapy, >3 hours/day. PT will continue to follow acutely.        If plan is discharge home, recommend the following: Two people to help with walking and/or transfers;Two people to help with bathing/dressing/bathroom;Assistance with cooking/housework;Assistance with feeding;Direct supervision/assist for medications management;Direct supervision/assist for financial management;Assist for transportation;Help with stairs or ramp for entrance   Can travel by private vehicle        Equipment Recommendations Rolling walker (2 wheels);Hospital bed;Hoyer lift;BSC/3in1  Recommendations for Other Services        Functional Status Assessment Patient has had a recent decline in their functional status and demonstrates the ability to make significant improvements in function in a reasonable and predictable amount of time.     Precautions / Restrictions Precautions Precautions: Fall;Sternal Precaution Booklet Issued: No Recall of Precautions/Restrictions: Intact Precaution/Restrictions Comments: CRRT, chest tube Restrictions Weight Bearing Restrictions Per Provider Order: No RUE Weight Bearing Per Provider Order: Non weight bearing LUE Weight Bearing Per Provider Order: Non weight bearing Other Position/Activity Restrictions: Sternal precautions      Mobility  Bed Mobility Overal bed mobility: Needs Assistance Bed Mobility: Rolling Rolling: Max assist, +2 for safety/equipment, +2 for physical assistance         General bed mobility comments: Max +2 assist to roll for placement of pads. Use of bed egress feature d/t lines    Transfers Overall transfer level: Needs assistance Equipment used: 2 person hand held assist Transfers: Sit to/from Stand Sit to Stand: Max assist, +2 physical assistance, +2 safety/equipment           General transfer comment: Attempted x2 STS trials from EOB in egress position. Unable to clear hips with max +2 assist. Pt with soft BP and R lateral lean, deferred further attempt and returned to supine           Balance Overall balance assessment: Needs assistance Sitting-balance support: Bilateral upper extremity supported, Feet supported Sitting balance-Leahy Scale: Poor Sitting balance - Comments: R lateral lean, min to mod assist to correct  Pertinent Vitals/Pain Pain Assessment Pain Assessment: Faces Faces Pain Scale: Hurts little more Pain Location: chest with movement Pain Descriptors / Indicators: Grimacing, Guarding, Operative site guarding Pain Intervention(s): Limited activity within  patient's tolerance, Monitored during session, Repositioned    Home Living Family/patient expects to be discharged to:: Private residence Living Arrangements: Spouse/significant other Available Help at Discharge: Family;Available PRN/intermittently Type of Home: House Home Access: Stairs to enter Entrance Stairs-Rails: None Entrance Stairs-Number of Steps: 1   Home Layout: One level Home Equipment: Cane - quad      Prior Function Prior Level of Function : Independent/Modified Independent             Mobility Comments: Ind, no AD, 1x fall in last 6 months ADLs Comments: Ind     Extremity/Trunk Assessment   Upper Extremity Assessment Upper Extremity Assessment: Defer to OT evaluation    Lower Extremity Assessment Lower Extremity Assessment: Generalized weakness (R slightly>L)    Cervical / Trunk Assessment Cervical / Trunk Assessment: Kyphotic  Communication   Communication Communication: No apparent difficulties    Cognition Arousal: Alert Behavior During Therapy: WFL for tasks assessed/performed                             Following commands: Intact       Cueing Cueing Techniques: Verbal cues     General Comments General comments (skin integrity, edema, etc.): Soft BP with attempted stand, BP 80s/40s. RN notified        Assessment/Plan    PT Assessment Patient needs continued PT services  PT Problem List Decreased strength;Decreased range of motion;Decreased activity tolerance;Decreased balance;Decreased mobility;Decreased coordination;Decreased cognition;Decreased safety awareness;Cardiopulmonary status limiting activity;Pain;Decreased skin integrity       PT Treatment Interventions DME instruction;Gait training;Stair training;Functional mobility training;Therapeutic activities;Therapeutic exercise;Balance training;Cognitive remediation;Patient/family education    PT Goals (Current goals can be found in the Care Plan section)  Acute  Rehab PT Goals PT Goal Formulation: Patient unable to participate in goal setting Time For Goal Achievement: 01/25/24 Potential to Achieve Goals: Fair    Frequency Min 3X/week     Co-evaluation PT/OT/SLP Co-Evaluation/Treatment: Yes Reason for Co-Treatment: To address functional/ADL transfers;Complexity of the patient's impairments (multi-system involvement) PT goals addressed during session: Mobility/safety with mobility OT goals addressed during session: ADL's and self-care       AM-PAC PT 6 Clicks Mobility  Outcome Measure Help needed turning from your back to your side while in a flat bed without using bedrails?: Total Help needed moving from lying on your back to sitting on the side of a flat bed without using bedrails?: Total Help needed moving to and from a bed to a chair (including a wheelchair)?: Total Help needed standing up from a chair using your arms (e.g., wheelchair or bedside chair)?: Total Help needed to walk in hospital room?: Total Help needed climbing 3-5 steps with a railing? : Total 6 Click Score: 6    End of Session   Activity Tolerance: Treatment limited secondary to medical complications (Comment) (soft BP) Patient left: in bed;with nursing/sitter in room;with call bell/phone within reach Nurse Communication: Mobility status;Other (comment) (soft BP with attempts to stand) PT Visit Diagnosis: Unsteadiness on feet (R26.81);Other abnormalities of gait and mobility (R26.89);Muscle weakness (generalized) (M62.81);Difficulty in walking, not elsewhere classified (R26.2);Pain Pain - part of body:  (sternum)    Time: 8671-8641 PT Time Calculation (min) (ACUTE ONLY): 30 min   Charges:   PT Evaluation $PT Eval  High Complexity: 1 High   PT General Charges $$ ACUTE PT VISIT: 1 Visit         Altair Stanko B. Fleeta Lapidus PT, DPT Acute Rehabilitation Services Please use secure chat or  Call Office [phone]   Almarie KATHEE Fleeta Fleet 01/11/2024, 5:20  PM

## 2024-01-11 NOTE — Progress Notes (Signed)
 PT Cancellation Note  Patient Details Name: Timothy Holloway MRN: [id number] DOB: Jul 10, 1955   Cancelled Treatment:    Reason Eval/Treat Not Completed: (P) Patient not medically ready Plan for patient to be extubated this morning PT will follow back this afternoon for Evaluation this afternoon as appropriate.  Otto Felkins B. Fleeta Lapidus PT, DPT Acute Rehabilitation Services Please use secure chat or  Call Office [phone]   Almarie KATHEE [facility]eta [facility] 01/11/2024, 8:43 AM

## 2024-01-11 NOTE — Progress Notes (Signed)
 [facility] Progress Note   Subjective:    Tolerating CRRT well, pulling 100- 200 cc/hr overnight  Per RN this am they are getting 200 cc/hr Went for stenting of L carotid per [facility]VS last night  Objective Vitals:   01/11/24 0900 01/11/24 0917 01/11/24 0930 01/11/24 0947  BP:      Pulse: 78 73 69 78  Resp: 19 17 19 15   Temp:      TempSrc:      SpO2: 97% 98% 98% 98%  Weight:      Height:       Physical Exam General: alert, nad Heart: RRR Lungs: CTAB, nml WOB on RA Abdomen: soft, NTND Extremities: trace LE edema Dialysis Access: TDC  Neuro: A&O, some speech slurring, bilateral grip strength intact     Dialysis Orders: [facility] MWF 4h B350  133.5kg  TDC  Heparin  2000+ 1400u/hr Calcitriol 1.25 three times per week Mircera 90 mcg q 2 wks (last 12/1)    Assessment/Plan: NSTEMI - LHC 12/5 w/ 3V CAD. EF 30-35%. S/p CABG on 12/08 Stroke-like symptoms: per neuro, no acute CVA by imaging. Advanced carotid disease by CTA -> went for L carotid stenting procedure per VVS last night.  Hypotension: low-dose levo 2 micrograms/min this am.  ESRD - usual HD MWF. CRRT started on 12/10. Cont CRRT.  Access- Using [facility]. H/o failed AVF x 2.  Volume - vasc congestion last CXR. UF currently 100- 200 cc/hr per CCM. Continue.  Anemia - Hgb 8.6. follow post op trend. Hb 7- 8 -> will resume ESA here (last op esa 12/01) w/ darbe 100 mcg weekly. No iron with high ferritin.  Transfuse prn.  Nutrition - Renal diet with fluid restriction    Myer Fret  MD  [facility] 01/11/2024, 9:58 AM  Recent Labs  Lab 01/10/24 1845 01/11/24 0500 01/11/24 0750  HGB  --  7.7* 7.8*  ALBUMIN  2.2* 2.2*  --   CALCIUM  7.4* 7.7*  --   PHOS 6.2* 4.4  4.4  --   CREATININE 5.84* 4.50*  --   K 4.9 4.4 4.5    Inpatient medications:  acetaminophen   1,000 mg Oral Q6H   Or   acetaminophen  (TYLENOL ) oral liquid 160 mg/5 mL  1,000 mg Per Tube Q6H   aspirin  EC  325 mg Oral Daily   Or   aspirin   324 mg Per  Tube Daily   atorvastatin   80 mg Per Tube Daily   bisacodyl   10 mg Oral Daily   Or   bisacodyl   10 mg Rectal Daily   Chlorhexidine  Gluconate Cloth  6 each Topical Daily   clopidogrel   75 mg Per Tube Daily   docusate  200 mg Per Tube Daily   heparin  injection (subcutaneous)  5,000 Units Subcutaneous Q8H   insulin  aspart  2-6 Units Subcutaneous Q4H   insulin  glargine-yfgn  15 Units Subcutaneous Daily   midodrine   10 mg Per Tube Q8H   mouth rinse  15 mL Mouth Rinse Q2H   pantoprazole  (PROTONIX ) IV  40 mg Intravenous Q24H   sodium chloride  flush  10-40 mL Intracatheter Q12H   sodium chloride  flush  3 mL Intravenous Q12H    sodium chloride  10 mL/hr at 01/11/24 0900   albumin  human Stopped (01/09/24 0043)    ceFAZolin  (ANCEF ) IV Stopped (01/11/24 0345)   insulin  3 Units/hr (01/11/24 0900)   norepinephrine  (LEVOPHED ) Adult infusion 2 mcg/min (01/11/24 0900)   prismasol BGK 4/2.5 1,400 mL/hr at 01/11/24 [phone]  prismasol BGK 4/2.5 400 mL/hr at 01/11/24 0306   prismasol BGK 4/2.5 400 mL/hr at 01/11/24 0306   propofol  (DIPRIVAN ) infusion Stopped (01/11/24 0716)   albumin  human, albuterol , dextrose , fentaNYL  (SUBLIMAZE ) injection, heparin , ondansetron  (ZOFRAN ) IV, mouth rinse, oxyCODONE , sodium chloride  flush, sodium chloride  flush, traMADol , zolpidem 

## 2024-01-11 NOTE — Progress Notes (Signed)
 Nutrition Follow-up  DOCUMENTATION CODES:   Not applicable  INTERVENTION:   Recommend considering Cortrak placement for supplemental nutrition. It will be difficult to meet nutritional needs, in particular increased protein needs, on current diet. Discussed pt with SLP and Dr. Gretta (PCCM). Plan to tentatively place Cortrak tomorrow    Limited options while on FL, Nectar Thick diet. Pt can have nectar thickened beverages, puddings, yogurt.  Plan to add Magic cup TID with meals, each supplement provides 290 kcal and 9 grams of protein Add Might Shakes TID as well  Add Renal MVI   NUTRITION DIAGNOSIS:   Inadequate oral intake related to acute illness as evidenced by NPO status.  GOAL:   Patient will meet greater than or equal to 90% of their needs  MONITOR:   PO intake, Diet advancement, Supplement acceptance, Weight trends, Labs  REASON FOR ASSESSMENT:   Consult Assessment of nutrition requirement/status  ASSESSMENT:   Pt admitted with NSTEMI with acute systolic HF requiring CABG x 3, postop shock, currently on CRRT (hx of ESRD in iHD). Pt developed new onset R sided weakness on 12/10, Code stroke called and pt found to have L ICA stenosis and required TCAR. PMH includes ESRD on iHD, DM, HTN, obesity, chronic anemia  12/05 Admitted 12/08 iHD, OR for CABG, extubated 12/10 Concerns for CVA vs TIA, OR with Vascular Surgery for TCAR, returned on vent, [name] 12/11 Extubated, FEES-diet advanced to FL, nectar thick  Extubated this AM, remains on CRRT. UOP 500 mL in 24 hours but 0 mL recorded so far today Levophed  at 3  NPO post extubation this AM, SLP advanced to [facility],. Nectar Thick post FEES  Pt has been on and off po diet since admission; Renal Carb Mod to Carb Mod to Heart Healthy /Carb Mod. Recorded po intake 50-100% of meals (although only 6 meals documented)  Pt reports appetite is good currently, denies any negative GI symptoms like nausea or reflux. Pt also  reports good appetite at home, eating 3 meals per day PTA.   Pt denies any changes in weight recently; reports UBW 295.5 pounds (134 kg).  Current Wt: 136.4 kg, weighed 130.2 kg yesterday AM prior to OR Admit Wt: around 132 kg  +constipation with last BM 12/06. Dulcolax daily. Colace daily ordered to start tomorrow. If continues without BM, recommend further adjusting bowel regimen  Weaning insulin  drip off. SS Novolog  plus 15 units of Semglee  Lab Results  Component Value Date   HGBA1C 9.9 (H) 01/05/2024    Labs: CBGs in 130-150s range on insulin  drip, follow trend post transition  Phosphorus 4.4 (wdl) Magnesium  2.6 (H) Potassium 4.4 (wdl) Sodium 132 (L)  Meds: SS Novolog  Midodrine  Rena-Vite Aranesp Plavix  Dulcolax Colace Protonix   NUTRITION - FOCUSED PHYSICAL EXAM:  Flowsheet Row Most Recent Value  Orbital Region No depletion  Upper Arm Region No depletion  Thoracic and Lumbar Region No depletion  Buccal Region No depletion  Temple Region No depletion  Clavicle Bone Region No depletion  Clavicle and Acromion Bone Region No depletion  Scapular Bone Region No depletion  Dorsal Hand Unable to assess  Patellar Region Unable to assess  Anterior Thigh Region Unable to assess  Posterior Calf Region Unable to assess  Edema (RD Assessment) Moderate    Diet Order:   Diet Order             Diet full liquid Room service appropriate? No; Fluid consistency: Nectar Thick  Diet effective now  EDUCATION NEEDS:   Education needs have been addressed  Skin:  Skin Assessment: Skin Integrity Issues: Skin Integrity Issues:: Incisions Incisions: surgical (closed) to neck, chest, groin, leg  Last BM:  12/06  Height:   Ht Readings from Last 1 Encounters:  01/11/24 6' (1.829 m)    Weight:   Wt Readings from Last 1 Encounters:  01/11/24 (!) 136.4 kg    BMI:  Body mass index is 40.78 kg/m.  Estimated Nutritional Needs:   Kcal:  2200-2400  kcals  Protein:  125-145 g  Fluid:  1L plus UOP   Betsey Finger MS, RDN, LDN, CNSC Registered Dietitian 3 Clinical Nutrition RD Inpatient Contact Info in Amion

## 2024-01-12 ENCOUNTER — Other Ambulatory Visit: Payer: Self-pay

## 2024-01-12 ENCOUNTER — Encounter (HOSPITAL_COMMUNITY): Payer: Self-pay

## 2024-01-12 DIAGNOSIS — I6522 Occlusion and stenosis of left carotid artery: Secondary | ICD-10-CM

## 2024-01-12 DIAGNOSIS — G9340 Encephalopathy, unspecified: Secondary | ICD-10-CM

## 2024-01-12 DIAGNOSIS — Z9862 Peripheral vascular angioplasty status: Secondary | ICD-10-CM

## 2024-01-12 DIAGNOSIS — D72829 Elevated white blood cell count, unspecified: Secondary | ICD-10-CM

## 2024-01-12 LAB — CBC
HCT: 22.8 % — ABNORMAL LOW (ref 39.0–52.0)
HCT: 22 % — ABNORMAL LOW (ref 39.0–52.0)
Hemoglobin: 7.2 g/dL — ABNORMAL LOW (ref 13.0–17.0)
Hemoglobin: 7 g/dL — ABNORMAL LOW (ref 13.0–17.0)
MCH: 31 pg (ref 26.0–34.0)
MCH: 30.8 pg (ref 26.0–34.0)
MCHC: 31.6 g/dL (ref 30.0–36.0)
MCHC: 31.8 g/dL (ref 30.0–36.0)
MCV: 98.3 fL (ref 80.0–100.0)
MCV: 96.9 fL (ref 80.0–100.0)
Platelets: 206 K/uL (ref 150–400)
Platelets: 216 K/uL (ref 150–400)
RBC: 2.32 MIL/uL — ABNORMAL LOW (ref 4.22–5.81)
RBC: 2.27 MIL/uL — ABNORMAL LOW (ref 4.22–5.81)
RDW: 16.4 % — ABNORMAL HIGH (ref 11.5–15.5)
RDW: 16.3 % — ABNORMAL HIGH (ref 11.5–15.5)
WBC: 11.9 K/uL — ABNORMAL HIGH (ref 4.0–10.5)
WBC: 12.4 K/uL — ABNORMAL HIGH (ref 4.0–10.5)
nRBC: 0.4 % — ABNORMAL HIGH (ref 0.0–0.2)
nRBC: 0.2 % (ref 0.0–0.2)

## 2024-01-12 LAB — RENAL FUNCTION PANEL
Albumin: 1.9 g/dL — ABNORMAL LOW (ref 3.5–5.0)
Albumin: 2 g/dL — ABNORMAL LOW (ref 3.5–5.0)
Anion gap: 8 (ref 5–15)
Anion gap: 6 (ref 5–15)
BUN: 27 mg/dL — ABNORMAL HIGH (ref 8–23)
BUN: 24 mg/dL — ABNORMAL HIGH (ref 8–23)
CO2: 24 mmol/L (ref 22–32)
CO2: 25 mmol/L (ref 22–32)
Calcium: 7.6 mg/dL — ABNORMAL LOW (ref 8.9–10.3)
Calcium: 7.9 mg/dL — ABNORMAL LOW (ref 8.9–10.3)
Chloride: 102 mmol/L (ref 98–111)
Chloride: 102 mmol/L (ref 98–111)
Creatinine, Ser: 3.51 mg/dL — ABNORMAL HIGH (ref 0.61–1.24)
Creatinine, Ser: 3.06 mg/dL — ABNORMAL HIGH (ref 0.61–1.24)
GFR, Estimated: 18 mL/min — ABNORMAL LOW (ref >60)
GFR, Estimated: 21 mL/min — ABNORMAL LOW (ref >60)
Glucose, Bld: 146 mg/dL — ABNORMAL HIGH (ref 70–99)
Glucose, Bld: 244 mg/dL — ABNORMAL HIGH (ref 70–99)
Phosphorus: 3.9 mg/dL (ref 2.5–4.6)
Phosphorus: 4 mg/dL (ref 2.5–4.6)
Potassium: 4.3 mmol/L (ref 3.5–5.1)
Potassium: 4.4 mmol/L (ref 3.5–5.1)
Sodium: 134 mmol/L — ABNORMAL LOW (ref 135–145)
Sodium: 133 mmol/L — ABNORMAL LOW (ref 135–145)

## 2024-01-12 LAB — POCT I-STAT 7, (LYTES, BLD GAS, ICA,H+H)
Acid-base deficit: 1 mmol/L (ref 0.0–2.0)
Bicarbonate: 22.9 mmol/L (ref 20.0–28.0)
Calcium, Ion: 1.09 mmol/L — ABNORMAL LOW (ref 1.15–1.40)
HCT: 20 % — ABNORMAL LOW (ref 39.0–52.0)
Hemoglobin: 6.8 g/dL — CL (ref 13.0–17.0)
O2 Saturation: 95 %
Patient temperature: 97.5
Potassium: 4.6 mmol/L (ref 3.5–5.1)
Sodium: 134 mmol/L — ABNORMAL LOW (ref 135–145)
TCO2: 24 mmol/L (ref 22–32)
pCO2 arterial: 34.4 mmHg (ref 32–48)
pH, Arterial: 7.43 (ref 7.35–7.45)
pO2, Arterial: 71 mmHg — ABNORMAL LOW (ref 83–108)

## 2024-01-12 LAB — GLUCOSE, CAPILLARY
Glucose-Capillary: 145 mg/dL — ABNORMAL HIGH (ref 70–99)
Glucose-Capillary: 147 mg/dL — ABNORMAL HIGH (ref 70–99)
Glucose-Capillary: 230 mg/dL — ABNORMAL HIGH (ref 70–99)
Glucose-Capillary: 246 mg/dL — ABNORMAL HIGH (ref 70–99)
Glucose-Capillary: 201 mg/dL — ABNORMAL HIGH (ref 70–99)
Glucose-Capillary: 187 mg/dL — ABNORMAL HIGH (ref 70–99)

## 2024-01-12 LAB — MAGNESIUM: Magnesium: 2.7 mg/dL — ABNORMAL HIGH (ref 1.7–2.4)

## 2024-01-12 LAB — AMMONIA
Ammonia: 29 umol/L (ref 9–35)
Ammonia: 17 umol/L (ref 9–35)

## 2024-01-12 LAB — HEMOGLOBIN AND HEMATOCRIT, BLOOD
HCT: 24.8 % — ABNORMAL LOW (ref 39.0–52.0)
Hemoglobin: 7.9 g/dL — ABNORMAL LOW (ref 13.0–17.0)

## 2024-01-12 LAB — PREPARE RBC (CROSSMATCH)

## 2024-01-12 MED ORDER — SODIUM CHLORIDE 0.9 % IV SOLN
500.0000 [IU]/h | INTRAVENOUS | Status: DC
  Administered 2024-01-12 (×2): 500 [IU]/h via INTRAVENOUS_CENTRAL
  Filled 2024-01-12 (×2): qty 2

## 2024-01-12 MED ORDER — LACTULOSE 10 GM/15ML PO SOLN
20.0000 g | Freq: Two times a day (BID) | ORAL | Status: AC
  Administered 2024-01-12 (×2): 20 g via ORAL
  Filled 2024-01-12 (×2): qty 30

## 2024-01-12 MED ORDER — INSULIN ASPART 100 UNIT/ML IJ SOLN
3.0000 [IU] | INTRAMUSCULAR | Status: DC
  Administered 2024-01-12 – 2024-01-13 (×7): 3 [IU] via SUBCUTANEOUS
  Filled 2024-01-12 (×3): qty 3

## 2024-01-12 MED ORDER — OXYCODONE HCL 5 MG PO TABS
2.5000 mg | ORAL_TABLET | ORAL | Status: DC | PRN
  Administered 2024-01-14 – 2024-01-17 (×6): 2.5 mg via ORAL
  Filled 2024-01-12 (×6): qty 1

## 2024-01-12 MED ORDER — VITAL 1.5 CAL PO LIQD
1000.0000 mL | ORAL | Status: DC
  Administered 2024-01-12 – 2024-01-14 (×2): 1000 mL

## 2024-01-12 MED ORDER — SODIUM CHLORIDE 0.9% IV SOLUTION: Freq: Once | INTRAVENOUS | Status: AC

## 2024-01-12 MED ORDER — PROSOURCE TF20 ENFIT COMPATIBL EN LIQD
60.0000 mL | Freq: Two times a day (BID) | ENTERAL | Status: DC
  Administered 2024-01-12 – 2024-01-15 (×7): 60 mL
  Filled 2024-01-12 (×7): qty 60

## 2024-01-12 MED ADMIN — Lidocaine Patch 5%: 2 | TRANSDERMAL | NDC 65162079104

## 2024-01-12 MED FILL — Lidocaine Patch 5%: 2.0000 | CUTANEOUS | Qty: 2 | Status: AC

## 2024-01-12 NOTE — Progress Notes (Signed)
 [facility]  Subjective  -orthostatic when out of bed last night   Objective (!) 103/58 (!) 58 98.8 F (37.1 C) (Oral) 11 99%  Intake/Output Summary (Last 24 hours) at 01/12/2024 0807 Last data filed at 01/12/2024 0700 Gross per 24 hour  Intake 270.45 ml  Output 4601 ml  Net -4330.55 ml    Bilateral upper extremities 5 out of 5 Bilateral lower extremities equal but decreased strength and no movement against gravity with feet resting on pillow but following commands  Laboratory Lab Results: Recent Labs    01/11/24 0500 01/11/24 0750 01/12/24 0448  WBC 12.5*  --  11.9*  HGB 7.7* 7.8* 7.2*  HCT 24.4* 23.0* 22.8*  PLT 226  --  206   BMET Recent Labs    01/11/24 1607 01/12/24 0448  NA 133* 134*  K 4.7 4.3  CL 101 102  CO2 22 24  GLUCOSE 210* 146*  BUN 30* 27*  CREATININE 4.17* 3.51*  CALCIUM  7.5* 7.6*    COAG Lab Results  Component Value Date   INR 1.3 (H) 01/08/2024   INR 1.1 01/07/2024   INR 1.1 11/11/2022   No results found for: PTT  Assessment/Planning:  Postop day 2 status post left TCAR for symptomatic high-grade calcified stenosis with ongoing crescendo neurologic symptoms in the ICU following CABG.  Left neck incision looks good.  Appears at his neurologic baseline from our initial evaluation in the ICU.  Continue aspirin  Plavix  statin.  Will need duplex in 1 month to evaluate stent as we discussed.  Timothy Holloway 01/12/2024 8:07 AM --

## 2024-01-12 NOTE — Progress Notes (Signed)
   Inpatient Rehab Admissions Coordinator :  Per therapy recommendations patient was screened for CIR candidacy by Ottie Glazier RN MSN. Patient is not yet at a level to tolerate the intensity required to pursue a CIR admit . The CIR admissions team will follow and monitor for progress and place a Rehab Consult order if felt to be appropriate. Please contact me with any questions.  Ottie Glazier RN MSN Admissions Coordinator [phone]

## 2024-01-12 NOTE — Inpatient Diabetes Management (Signed)
 Inpatient Diabetes Program Recommendations  AACE/ADA: New Consensus Statement on Inpatient Glycemic Control (2015)  Target Ranges:  Prepandial:   less than 140 mg/dL      Peak postprandial:   less than 180 mg/dL (1-2 hours)      Critically ill patients:  140 - 180 mg/dL   Lab Results  Component Value Date   GLUCAP 147 (H) 01/12/2024   HGBA1C 9.9 (H) 01/05/2024    Review of Glycemic Control  Latest Reference Range & Units 01/11/24 12:49 01/11/24 15:40 01/11/24 19:58 01/11/24 23:29 01/12/24 03:25 01/12/24 07:41  Glucose-Capillary 70 - 99 mg/dL 855 (H) 776 (H) 790 (H) 195 (H) 145 (H) 147 (H)  Diabetes history: DM 2 Outpatient Diabetes medications:  Lantus  50 units q HS, Humalog 10-20 units tid with meals  Current orders for Inpatient glycemic control:  Novolog  2-6 units q 4 hours Lantus  15 units daily Inpatient Diabetes Program Recommendations:    Note Cortrak placed this morning.  May consider slight increase of Lantus  to 10 units bid.  If Tube feeds started, will likely need tube feed coverage as well.   Thanks,  Randall Bullocks, RN, BC-ADM Inpatient Diabetes Coordinator Pager [phone]  (8a-5p)

## 2024-01-12 NOTE — Progress Notes (Signed)
° °  [address] [place] [address]             [phone]   POD # 4 CABG POD # 2 TCAR  Eating dinner  BP 120/69   Pulse 62   Temp 97.9 F (36.6 C) (Oral)   Resp 16   Ht 6' (1.829 m)   Wt 131.1 kg   SpO2 97%   BMI 39.20 kg/m   2L [place] 98% sat   Intake/Output Summary (Last 24 hours) at 01/12/2024 1905 Last data filed at 01/12/2024 1800 Gross per 24 hour  Intake 558.5 ml  Output 3673 ml  Net -3114.5 ml   On CRRT  Elspeth C. Kerrin, MD [facility] [phone]

## 2024-01-12 NOTE — Progress Notes (Signed)
° °   °  [address] [place] [address]             [phone]                2 Days Post-Op Procedures (LRB): TRANSCAROTID ARTERY REVASCULARIZATION (TCAR) (Left)   Events: Remains weak _______________________________________________________________ Vitals: BP 130/63   Pulse 62   Temp 97.9 F (36.6 C) (Oral)   Resp 16   Ht 6' (1.829 m)   Wt 131.1 kg   SpO2 98%   BMI 39.20 kg/m  Filed Weights   01/10/24 0500 01/11/24 0500 01/12/24 0500  Weight: 130.2 kg (!) 136.4 kg 131.1 kg     - Neuro: alert NAD.    - Cardiovascular: sinus brady  Drips: none    - Pulm: EWOB  ABG    Component Value Date/Time   PHART 7.430 01/12/2024 1028   PCO2ART 34.4 01/12/2024 1028   PO2ART 71 (L) 01/12/2024 1028   HCO3 22.9 01/12/2024 1028   TCO2 24 01/12/2024 1028   ACIDBASEDEF 1.0 01/12/2024 1028   O2SAT 95 01/12/2024 1028    - Abd: ND - Extremity: trace edema  .Intake/Output      12/11 0701 12/12 0700 12/12 0701 12/13 0700   P.O. 60 50   I.V. (mL/kg) 133.5 (1)    IV Piggyback 100    Total Intake(mL/kg) 293.5 (2.2) 50 (0.4)   Urine (mL/kg/hr) 48 (0)    Blood     Chest Tube 200    CRRT 4583 1070   Total Output 4831 1070   Net -4537.5 -1020           _______________________________________________________________ Labs:    Latest Ref Rng & Units 01/12/2024   10:40 AM 01/12/2024   10:28 AM 01/12/2024    4:48 AM  CBC  WBC 4.0 - 10.5 K/uL 12.4   11.9   Hemoglobin 13.0 - 17.0 g/dL 7.0  6.8  7.2   Hematocrit 39.0 - 52.0 % 22.0  20.0  22.8   Platelets 150 - 400 K/uL 216   206       Latest Ref Rng & Units 01/12/2024   10:28 AM 01/12/2024    4:48 AM 01/11/2024    4:07 PM  CMP  Glucose 70 - 99 mg/dL  853  789   BUN 8 - 23 mg/dL  27  30   Creatinine 9.38 - 1.24 mg/dL  6.48  5.82   Sodium 864 - 145 mmol/L 134  134  133   Potassium 3.5 - 5.1 mmol/L 4.6  4.3  4.7   Chloride 98 - 111 mmol/L  102  101   CO2 22 - 32 mmol/L  24  22   Calcium   8.9 - 10.3 mg/dL  7.6  7.5     CXR: stable  _______________________________________________________________  Assessment and Plan: POD 4 s/p CABG  POD 2 s/p TCAR  Neuro: pain controlled.  Continue to follow neuro exam.  Will get MRI once pacing wires remove CV: MAP goal 65-70.  Will remove pacing wires Pulm: IS, ambulation Renal: ESRD. CRRT today GI: on diet Heme: stable.  On plavix  ID: afebrile Endo: SSI Dispo: continue ICU care   Timothy Holloway 01/12/2024 2:58 PM

## 2024-01-12 NOTE — Procedures (Signed)
 Cortrak  Tube Type:  Cortrak - 43 inches Tube Location:  Left nare Secured by: Bridle Initial Placement:  Gastric Technique Used to Measure Tube Placement:  Marking at nare/corner of mouth Cortrak Secured At:  70 cm   Cortrak Tube Team Note:  Consult received to place a Cortrak feeding tube.   No x-ray is required. RN may begin using tube.   If the tube becomes dislodged please keep the tube and contact the Cortrak team at [email or web address] for replacement.  If after hours and replacement cannot be delayed, place a NG tube and confirm placement with an abdominal x-ray.    Augustin Shams MS, RD, LDN If unable to be reached, please send secure chat to RD inpatient available from 8:00a-4:00p daily

## 2024-01-12 NOTE — Progress Notes (Signed)
 Nutrition Follow-up  DOCUMENTATION CODES:   Not applicable  INTERVENTION:   Tube Feeding via Cortrak: Vital 1.5 at 55 ml/hr Begin TF at 20 ml/hr, titrate by 10 mL q 8 hours until goal rate of 55 ml/hr Pro-source TF20 60 mL BID TF at goal 2140 kcals, 129 g of protein and 1003 mL of free water  +constipation; last BM 12/6. If no BM with initiation of TF, recommend considering adjusting bowel regimen, ?enema  Continue PO diet as tolerance  Continue Magic cup TID with meals, each supplement provides 290 kcal and 9 grams of protein  Continue Renal MVI daily  NUTRITION DIAGNOSIS:   Inadequate oral intake related to acute illness as evidenced by NPO status.  Being addressed via TF   GOAL:   Patient will meet greater than or equal to 90% of their needs  Progressing  MONITOR:   PO intake, Diet advancement, Supplement acceptance, Weight trends, Labs  REASON FOR ASSESSMENT:   Consult Assessment of nutrition requirement/status  ASSESSMENT:   Pt admitted with NSTEMI with acute systolic HF requiring CABG x 3, postop shock, currently on CRRT (hx of ESRD in iHD). Pt developed new onset R sided weakness on 12/10, Code stroke called and pt found to have L ICA stenosis and required TCAR. PMH includes ESRD on iHD, DM, HTN, obesity, chronic anemia  12/05 Admitted 12/08 iHD, OR for CABG, extubated 12/10 Concerns for CVA vs TIA, OR with Vascular Surgery for TCAR, returned on vent, [name] 12/11 Extubated, FEES-diet advanced to FL, nectar thick 12/12 Cortrak,TF initiated  Remains on CRRT, MRI brain pending Essentially anuric with only 48 mL documented UOP in 24 hours 4.5 L removed with CRRT  Mental status has been waxing and waning, sometimes lethargic/sleepy  FL Nectar Thick diet, SLP following Pt eating some of meal tray per RN; pt is taking some of the [employer] as well.   No BM since 12/06, +constipation. Noted dulcolax daily, lactulose started x 4 doses  today  Labs: Sodium 134 (L) BUN 27 Creatinine 3.51 Phosphorus 3.9 (wdl) Magnesium  2.7 (H) CBGs 144-233 (goal 140-180) Ammonia 29 (wdl)  Meds: SS novolog  Novolog  q 4 hours Lantus  Lactulose Dulcolax Plavix  Rena-Vite  Diet Order:   Diet Order             Diet full liquid Room service appropriate? No; Fluid consistency: Nectar Thick  Diet effective now                   EDUCATION NEEDS:   Education needs have been addressed  Skin:  Skin Assessment: Skin Integrity Issues: Skin Integrity Issues:: Incisions Incisions: surgical (closed) to neck, chest, groin, leg  Last BM:  12/06  Height:   Ht Readings from Last 1 Encounters:  01/11/24 6' (1.829 m)    Weight:   Wt Readings from Last 1 Encounters:  01/12/24 131.1 kg    BMI:  Body mass index is 39.2 kg/m.  Estimated Nutritional Needs:   Kcal:  2200-2400 kcals  Protein:  125-145 g  Fluid:  1L plus UOP  Betsey Finger MS, RDN, LDN, CNSC Registered Dietitian 3 Clinical Nutrition RD Inpatient Contact Info in Amion

## 2024-01-12 NOTE — Progress Notes (Signed)
 SLP Cancellation Note  Patient Details Name: Timothy Holloway MRN: [id number] DOB: 1955-03-17   Cancelled treatment:       Reason Eval/Treat Not Completed: Patient declined, no reason specified;Patient's level of consciousness. Pt in chair position, very drowsy, still totally aphonic. Pt refused every offer of food and drink. Reiterated simple result from swallow eval and reason for cortrak. Pt closed eyes and didn't respond. Will f/u for greater activity tolerance.    Holden Maniscalco, Consuelo Fitch 01/12/2024, 11:28 AM

## 2024-01-12 NOTE — Plan of Care (Signed)
°  Problem: Clinical Measurements: Goal: Ability to maintain clinical measurements within normal limits will improve Outcome: Progressing Goal: Will remain free from infection Outcome: Progressing Goal: Diagnostic test results will improve Outcome: Progressing Goal: Respiratory complications will improve Outcome: Progressing Goal: Cardiovascular complication will be avoided Outcome: Progressing   Problem: Activity: Goal: Risk for activity intolerance will decrease Outcome: Progressing   Problem: Pain Managment: Goal: General experience of comfort will improve and/or be controlled Outcome: Progressing   Problem: Education: Goal: Will demonstrate proper wound care and an understanding of methods to prevent future damage Outcome: Progressing Goal: Knowledge of disease or condition will improve Outcome: Progressing Goal: Knowledge of the prescribed therapeutic regimen will improve Outcome: Progressing Goal: Individualized Educational Video(s) Outcome: Progressing   Problem: Education: Goal: Knowledge of disease or condition will improve 01/12/2024 0655 by Geryl Adolphus BROCKS, RN Outcome: Progressing 01/12/2024 0655 by Geryl Adolphus BROCKS, RN Outcome: Progressing Goal: Knowledge of secondary prevention will improve (MUST DOCUMENT ALL) 01/12/2024 0655 by Geryl Adolphus BROCKS, RN Outcome: Progressing 01/12/2024 0655 by Geryl Adolphus BROCKS, RN Outcome: Progressing Goal: Knowledge of patient specific risk factors will improve (DELETE if not current risk factor) 01/12/2024 0655 by Geryl Adolphus BROCKS, RN Outcome: Progressing 01/12/2024 0655 by Geryl Adolphus BROCKS, RN Outcome: Progressing   Problem: Ischemic Stroke/TIA Tissue Perfusion: Goal: Complications of ischemic stroke/TIA will be minimized Outcome: Progressing   Problem: Nutrition: Goal: Risk of aspiration will decrease Outcome: Progressing Goal: Dietary intake will improve Outcome: Progressing

## 2024-01-12 NOTE — Progress Notes (Addendum)
 NAME:  Timothy Holloway, MRN:  [id number], DOB:  May 08, 1955, LOS: 7 ADMISSION DATE:  01/05/2024, CONSULTATION DATE:  01/08/24 REFERRING MD:  Raenelle POUR., CHIEF COMPLAINT:  s/p CABG   History of Present Illness:  Timothy Holloway is a 68 year old male with past medical history significant for ESRD on HD, T2DM, HTN, obesity, chronic anemia who presented to [facility] ED 01/05/24 after experiencing chest pain/pressure during dialysis. In the ED, troponins were elevated, he was started on a heparin  gtt and transferred to [facility] for a cardiac cath where he was found to have triple-vessel disease and CHF. TCTS to take patient for CABG 12/8. PCCM consulted for post-operative medical management.  OR course EBL: 1100 Cell saver 482 Products 0 Bypass time: 1hr 58 min  Clamp time 1hr 21 min   Pertinent Medical History:   Past Medical History:  Diagnosis Date   Anemia    Cataract    Chronic kidney disease (CKD), stage III (moderate) (HCC)    dr Faythe   Depression    Diabetes mellitus without complication (HCC)    DVT (deep venous thrombosis) (HCC)    as a child   ESRD on dialysis [facility])    M-W-F   Hyperlipidemia    Hypertension    Impotence    Insomnia    Obesity    Steal syndrome as complication of dialysis access    Significant Hospital Events: Including procedures, antibiotic start and stop dates in addition to other pertinent events   12/5: Transfer from [facility]->cath showed triple-vessel disease/CHF 12/8: iHD in am Pre-op   CABG w/ Dr. Shyrl, successfully extubated.  12/9 no distress. Weaning NE 12/10 Concerns for CVA vs TIA yesterday, however when MAPs improved 12/11 Code stroke called yesterday, right hemiplegia, right eye visual changes- neuro s/s not responding to MAP, CT perfusion scan completed- [name], came back intubated after procedure, [name]   Interim History / Subjective:  Drowsy this morning but oriented x 3 and follows commands throughout with  slight RLE. No specific complaints.   Objective    Blood pressure 130/63, pulse 60, temperature 97.9 F (36.6 C), temperature source Oral, resp. rate 11, height 6' (1.829 m), weight 131.1 kg, SpO2 98%.        Intake/Output Summary (Last 24 hours) at 01/12/2024 1540 Last data filed at 01/12/2024 1500 Gross per 24 hour  Intake 220.95 ml  Output 4797 ml  Net -4576.05 ml   Filed Weights   01/10/24 0500 01/11/24 0500 01/12/24 0500  Weight: 130.2 kg (!) 136.4 kg 131.1 kg   Examination: General: acute on chronically ill appearing male, resting in bed comfortably, somnolent  HEENT: [place]/AT, sclera anicteric  CV: regular rate and rhythm, normal S1 and S2 GI: abdomen soft, non-tender, non-distended Extremities: warm, dry, no edema Skin: no rashes or lesions Neuro: follows commands in all four extremities RLE slightly weaker than LLE, BUE 4+/5  GU: deferred   Resolved Problem List:  Endotracheally intubated Assessment and Plan:   L ICA stenosis s/p TCAR Stroke, likely left MCA due to large vessel disease/high grade L ICA stenosis 12/9-12/10 episodes of transient right sided weakness in the setting of hypotension. S/p TCAR on 12/10. Remains with some right lower extremity weakness. -vascular following, appreciate assistance -neurology following, appreciate assistance  -orthostatic this morning, decrease CRRT 100cc/hr  -MRI brain when off CRRT  -continue frequent neurochecks, neurology following, appreciate assistance -continue midodrine  -continue aspirin  and plavix  s/p TCAR  3 vessel CAD with  NSTEMI s/p CABGx3  Acute systolic heart failure -remove pacing wires today -chest tube management per TCTS -continue ASA and Plavix  -continue statin -holding BB with midodrine  requirement   Encephalopathy, likely hypoactive delirium and sensitive to narcotics  -ammonia normal  -delirium precautions, normalize sleep/wake cycle, re-orient frequent -limit sedating medications -decrease  oxycodone    Leukocytosis  Afebrile. -continue to trend   Expected post-operative ABLA Expected post-operative consumptive thrombocytopenia  Hgb dropped to 7.0 this morning likely due to losing blood when CRRT circuit clotted.  -will give 1 PRBC -continue to trend CBC  ESRD on HD -continue CRRT, decrease fluid removal per above -continue to trend daily BMP -circuit clotting, will discuss adding heparin  versus citrate with nephrology  T2DM -continue glargine 15 units daily, novolog  3 units q4h and SSI -CBG goal 140-180  Deconditioning -continue PT/OT   The patient is critically ill with multiple organ system failure and requires high complexity decision making for assessment and support, frequent evaluation and titration of therapies, advanced monitoring, review of radiographic studies and interpretation of complex data.   Critical Care Time devoted to patient care services, exclusive of separately billable procedures, described in this note is 35 minutes.  Rexene LOISE Blush, PA-C [facility] 01/12/2024 4:20 PM  Please see [email or web address] for pager details.  From 7A-7P if no response, please call [phone] After hours, please call ELink [phone]

## 2024-01-12 NOTE — Progress Notes (Signed)
 STROKE TEAM PROGRESS NOTE    SIGNIFICANT HOSPITAL EVENTS 12/5: Patient admitted to [facility] with chest pain and chest pressure during dialysis, cardiac cath revealed three-vessel disease, transferred here for further care 12/8: Three-vessel CABG performed 12/9: Code stroke activated for aphasia and right hemiparesis, symptoms noted to improve with head of bed flat and elevated MAP 12/10: Left-sided TCAR performed with 99% stenosis of left carotid noted 12/11: working with PT had orthostatic event   INTERIM HISTORY/SUBJECTIVE RN is at the bedside, pt lying in bed, AAO x3, still has hoarseness, but neurologically stable. On CRRT. Per RN, pt worked with PT yesterday had orthostatic event with again R sided weakness and R facial droop but back to baseline after lying back. Cardiac wire is out today, plan for MRI when CRRT is off.   OBJECTIVE  CBC    Component Value Date/Time   WBC 12.4 (H) 01/12/2024 1040   RBC 2.27 (L) 01/12/2024 1040   HGB 7.0 (L) 01/12/2024 1040   HCT 22.0 (L) 01/12/2024 1040   PLT 216 01/12/2024 1040   MCV 96.9 01/12/2024 1040   MCH 30.8 01/12/2024 1040   MCHC 31.8 01/12/2024 1040   RDW 16.3 (H) 01/12/2024 1040   LYMPHSABS 1.3 10/21/2023 1134   MONOABS 0.9 10/21/2023 1134   EOSABS 0.6 (H) 10/21/2023 1134   BASOSABS 0.1 10/21/2023 1134    BMET    Component Value Date/Time   NA 134 (L) 01/12/2024 1028   NA 144 11/09/2012 0938   K 4.6 01/12/2024 1028   CL 102 01/12/2024 0448   CO2 24 01/12/2024 0448   GLUCOSE 146 (H) 01/12/2024 0448   BUN 27 (H) 01/12/2024 0448   BUN 31 (H) 11/09/2012 0938   CREATININE 3.51 (H) 01/12/2024 0448   CREATININE 1.68 (H) 09/03/2012 1009   CALCIUM  7.6 (L) 01/12/2024 0448   CALCIUM  8.5 (L) 05/06/2022 1030   GFRNONAA 18 (L) 01/12/2024 0448   GFRNONAA 44 (L) 09/03/2012 1009    IMAGING past 24 hours No results found.   Vitals:   01/12/24 1230 01/12/24 1245 01/12/24 1300 01/12/24 1315  BP:      Pulse: 61 68 65 64  Resp:  11 15 15  (!) 21  Temp:      TempSrc:      SpO2: 97% 95% 97% 97%  Weight:      Height:         PHYSICAL EXAM General: lethargic elderly patient in no acute distress CV: Regular, paced rate and rhythm on monitor Respiratory: Respirations regular and unlabored on supplemental O2  NEURO:  Awake, alert, orientated to age, place, time and people. No aphasia however moderate dysarthria with hoarseness, following all simple commands. Able to name and repeat  in dysarthric voice. No gaze palsy, tracking bilaterally, visual field full.  Slight right facial droop. Tongue midline. RUE 4/5 with mild asterixis, LUE 5/5, BLEs proximal 3/5 and RLE distal 3/5 and LLE distal 4/5. Sensation symmetrical bilaterally, no sensory neglect, b/l FTN intact but slow, gait not tested.    ASSESSMENT/PLAN  [name] is a 68 y.o. male with history of CAD, end-stage renal disease on dialysis, hypertension, hyperlipidemia and diabetes who was originally admitted for chest pain and pressure, was transferred here for CABG, underwent surgery and was postoperatively found to have aphasia and right hemiparesis.  Symptoms improved when head of bed was flat and perfusion was ensured with MAP greater than 70, and patient was also found to have 80% stenosis of  left carotid artery.  Overnight, he developed fluid overload and respiratory distress and is now being placed on CRRT.  MRI is pending and cannot be performed until pacer wires have been removed.  Yesterday, patient underwent urgent TCAR of left carotid after fluctuations in symptoms were noted, which was found to have 99% calcified stenosis.  Stroke: Likely left MCA infarct due to large vessel disease source of high grade left ICA stenosis Code Stroke CT head No acute abnormality. ASPECTS 10.    CTA head & neck 80% stenosis of left ICA, 50% stenosis of right ICA, moderate to severe stenosis of right vertebral artery V2 segment, moderate stenosis of right  vertebral artery V3 V4 segments, fetal right PCA MRI will be performed once off CRRT 2D Echo hypokinesis of distal anteroseptal wall and apex, EF 40 to 45%  LDL 33 HgbA1c 9.9 VTE prophylaxis - subcutaneous heparin  aspirin  81 mg daily prior to admission, now on aspirin  325 mg daily, plan to start Plavix  after pacer wires removed Therapy recommendations:  CIR Disposition: Pending  Hx of hypertension Hypotension Orthostatic hypotension Home meds: Hydralazine  100 mg twice daily, hydrochlorothiazide  25 mg daily, losartan  100 mg daily, clonidine  0.1 mg 2 times daily, carvedilol  6.25 mg twice daily BP stable on the low end Off Levophed  On midodrine  10 mg 3 times daily Avoid low BP On TED hose Long-term BP goal normotensive  Left ICA stenosis Patient found to have 80% stenosis of left ICA on CT angiogram CUS left ICA 80-99% stenosis, R ICA 40-59% stenosis LICA estimated 99% by cerebral angiogram S/p TCAR on 12/10 with Dr. Gretta [facility]  Hoarseness  Post TCAR procedure Could be injury from TCAR or intubation Speech on board Continue to monitor, expecting improvement over time  Hyperlipidemia Home meds: Atorvastatin  10 mg daily, increased to 80 per primary team LDL 33, goal < 70 Continue statin at discharge  Diabetes type II poorly controlled Home meds: None HgbA1c 9.9, goal < 7.0 CBGs SSI Recommend close follow-up with PCP for better DM control  Dysphagia Patient has post-stroke dysphagia and hoarseness  SLP consulted On nectar thick liquid Also s/p cortrak   CAD post three-vessel CABG Patient underwent CABG on 12/8 Postoperative care per cardiothoracic surgery Wire off today Will do MRI brain once off CRRT  Respiratory distress/fluid overload ESRD Patient noted to have labored respirations with wet lung sounds this morning Nephrology on board Continue CRRT   Other Stroke Risk Factors Obesity, Body mass index is 39.2 kg/m., BMI >/= 30 associated with increased  stroke risk, recommend weight loss, diet and exercise as appropriate  Congestive heart failure  Other Active Problems Leukocytosis WBC 16.3--19.0--14.1--12.5 Anemia of chronic disease Hb 8.5--8.8--8.4--7.7-7.2-7.0, receiving PRBC today  Hospital day # 7  Neurology will follow peripherally over the next week, please call us  back once MRI brain done.   Ary Cummins, MD PhD Stroke Neurology 01/12/2024 1:24 PM    To contact Stroke Continuity provider, please refer to [email or web address]. After hours, contact General Neurology

## 2024-01-12 NOTE — Progress Notes (Addendum)
 [facility] Progress Note   Subjective:    Tolerating CRRT  Net neg 4.5 L yesterday Wt down 131kg On 3 L [place]  Levo weaned off yest afternoon, no pressors today  Objective Vitals:   01/12/24 1045 01/12/24 1100 01/12/24 1115 01/12/24 1130  BP:      Pulse: 61 (!) 59 60 (!) 59  Resp: 15 16 13 16   Temp:      TempSrc:      SpO2: 95% 99% 98% 97%  Weight:      Height:       Physical Exam General: alert, nad, 3 L [place] O2 Heart: RRR Lungs: CTAB, nml WOB on RA Abdomen: soft, NTND Extremities: trace LE edema Dialysis Access: [facility]  Neuro: alert, responsive    Dialysis Orders: [facility] MWF 4h B350  133.5kg  TDC  Heparin  2000+ 1400u/hr Calcitriol 1.25 three times per week Mircera 90 mcg q 2 wks (last 12/1)    CXR 12/11-> resolved vasc congestion, improved IS edema   Assessment/Plan: NSTEMI - 3V CAD by LHC -> s/p CABG 12/08 Stroke-like symptoms: was waxing/ waning per neuro, no acute CVA by imaging, but advanced carotid disease by CTA -> underwent L carotid artery stent per VVS 12/10 overnight Hypotension: resolved, off pressors today ESRD - usual HD MWF. CRRT started on 12/10. Cont CRRT Access- Using [facility]. H/o failed AVF x 2.  Volume - vasc congestion last CXR. Good UF yest net neg 4.5 L, down to 131 kg (under dry wt). UF lowered to 100cc/hr this am per CCM.  Anemia - Hgb 8.6. follow post op trend. Hb 7- 8 -> will resume ESA here (last op esa 12/01) w/ darbe 100 mcg weekly. No iron with high ferritin.  Transfuse prn.  Nutrition - Renal diet with fluid restriction    Myer Fret  MD  [facility] 01/12/2024, 12:06 PM  Recent Labs  Lab 01/11/24 1607 01/12/24 0448 01/12/24 1028 01/12/24 1040  HGB  --  7.2* 6.8* 7.0*  ALBUMIN  1.9* 1.9*  --   --   CALCIUM  7.5* 7.6*  --   --   PHOS 4.1 3.9  --   --   CREATININE 4.17* 3.51*  --   --   K 4.7 4.3 4.6  --     Inpatient medications:  sodium chloride    Intravenous Once   acetaminophen   1,000 mg Oral Q6H   Or    acetaminophen  (TYLENOL ) oral liquid 160 mg/5 mL  1,000 mg Per Tube Q6H   aspirin  EC  81 mg Oral Daily   atorvastatin   80 mg Oral Daily   bisacodyl   10 mg Oral Daily   Or   bisacodyl   10 mg Rectal Daily   Chlorhexidine  Gluconate Cloth  6 each Topical Daily   clopidogrel   75 mg Oral Daily   darbepoetin (ARANESP ) injection - DIALYSIS  100 mcg Subcutaneous Q Thu-1800   heparin  injection (subcutaneous)  5,000 Units Subcutaneous Q8H   insulin  aspart  2-6 Units Subcutaneous Q4H   insulin  glargine  15 Units Subcutaneous Daily   lactulose   20 g Oral BID   lidocaine   2 patch Transdermal Q24H   midodrine   10 mg Oral Q8H   multivitamin  1 tablet Oral QHS   mouth rinse  15 mL Mouth Rinse Q2H   pantoprazole  (PROTONIX ) IV  40 mg Intravenous Q24H   sodium chloride  flush  10-40 mL Intracatheter Q12H   sodium chloride  flush  3 mL Intravenous Q12H  albumin  human Stopped (01/09/24 0043)   heparin  10,000 units/ 20 mL infusion syringe 500 Units/hr (01/12/24 1000)   norepinephrine  (LEVOPHED ) Adult infusion Stopped (01/11/24 1717)   prismasol  BGK 4/2.5 1,400 mL/hr at 01/12/24 1038   prismasol  BGK 4/2.5 400 mL/hr at 01/12/24 0034   prismasol  BGK 4/2.5 400 mL/hr at 01/12/24 0034   albumin  human, albuterol , fentaNYL  (SUBLIMAZE ) injection, heparin , ondansetron  (ZOFRAN ) IV, mouth rinse, oxyCODONE , sodium chloride  flush, sodium chloride  flush, traMADol , zolpidem 

## 2024-01-13 ENCOUNTER — Inpatient Hospital Stay (HOSPITAL_COMMUNITY)

## 2024-01-13 DIAGNOSIS — I214 Non-ST elevation (NSTEMI) myocardial infarction: Secondary | ICD-10-CM | POA: Diagnosis not present

## 2024-01-13 DIAGNOSIS — Z992 Dependence on renal dialysis: Secondary | ICD-10-CM | POA: Diagnosis not present

## 2024-01-13 DIAGNOSIS — I5021 Acute systolic (congestive) heart failure: Secondary | ICD-10-CM | POA: Diagnosis not present

## 2024-01-13 DIAGNOSIS — N186 End stage renal disease: Secondary | ICD-10-CM | POA: Diagnosis not present

## 2024-01-13 LAB — CBC
HCT: 24.8 % — ABNORMAL LOW (ref 39.0–52.0)
Hemoglobin: 7.8 g/dL — ABNORMAL LOW (ref 13.0–17.0)
MCH: 30.2 pg (ref 26.0–34.0)
MCHC: 31.5 g/dL (ref 30.0–36.0)
MCV: 96.1 fL (ref 80.0–100.0)
Platelets: 222 K/uL (ref 150–400)
RBC: 2.58 MIL/uL — ABNORMAL LOW (ref 4.22–5.81)
RDW: 16.8 % — ABNORMAL HIGH (ref 11.5–15.5)
WBC: 12.2 K/uL — ABNORMAL HIGH (ref 4.0–10.5)
nRBC: 0.5 % — ABNORMAL HIGH (ref 0.0–0.2)

## 2024-01-13 LAB — RENAL FUNCTION PANEL
Albumin: 2 g/dL — ABNORMAL LOW (ref 3.5–5.0)
Anion gap: 9 (ref 5–15)
BUN: 26 mg/dL — ABNORMAL HIGH (ref 8–23)
CO2: 26 mmol/L (ref 22–32)
Calcium: 8 mg/dL — ABNORMAL LOW (ref 8.9–10.3)
Chloride: 102 mmol/L (ref 98–111)
Creatinine, Ser: 2.8 mg/dL — ABNORMAL HIGH (ref 0.61–1.24)
GFR, Estimated: 24 mL/min — ABNORMAL LOW (ref >60)
Glucose, Bld: 203 mg/dL — ABNORMAL HIGH (ref 70–99)
Phosphorus: 3.2 mg/dL (ref 2.5–4.6)
Potassium: 4.3 mmol/L (ref 3.5–5.1)
Sodium: 137 mmol/L (ref 135–145)

## 2024-01-13 LAB — PHOSPHORUS: Phosphorus: 3.2 mg/dL (ref 2.5–4.6)

## 2024-01-13 LAB — TYPE AND SCREEN
ABO/RH(D): O POS
Antibody Screen: NEGATIVE
Unit division: 0

## 2024-01-13 LAB — GLUCOSE, CAPILLARY
Glucose-Capillary: 195 mg/dL — ABNORMAL HIGH (ref 70–99)
Glucose-Capillary: 227 mg/dL — ABNORMAL HIGH (ref 70–99)
Glucose-Capillary: 230 mg/dL — ABNORMAL HIGH (ref 70–99)
Glucose-Capillary: 212 mg/dL — ABNORMAL HIGH (ref 70–99)
Glucose-Capillary: 193 mg/dL — ABNORMAL HIGH (ref 70–99)
Glucose-Capillary: 302 mg/dL — ABNORMAL HIGH (ref 70–99)
Glucose-Capillary: 323 mg/dL — ABNORMAL HIGH (ref 70–99)
Glucose-Capillary: 339 mg/dL — ABNORMAL HIGH (ref 70–99)

## 2024-01-13 LAB — BPAM RBC
Blood Product Expiration Date: 202601052359
ISSUE DATE / TIME: 202512121345
Unit Type and Rh: 5100

## 2024-01-13 LAB — APTT: aPTT: 47 s — ABNORMAL HIGH (ref 24–36)

## 2024-01-13 LAB — HEMOGLOBIN AND HEMATOCRIT, BLOOD
HCT: 24.8 % — ABNORMAL LOW (ref 39.0–52.0)
Hemoglobin: 8 g/dL — ABNORMAL LOW (ref 13.0–17.0)

## 2024-01-13 LAB — MAGNESIUM: Magnesium: 2.5 mg/dL — ABNORMAL HIGH (ref 1.7–2.4)

## 2024-01-13 MED ORDER — INSULIN REGULAR(HUMAN) IN NACL 100-0.9 UT/100ML-% IV SOLN
INTRAVENOUS | Status: DC
  Administered 2024-01-13: 8 [IU]/h via INTRAVENOUS
  Administered 2024-01-14: 13 [IU]/h via INTRAVENOUS
  Filled 2024-01-13 (×2): qty 100

## 2024-01-13 MED ORDER — DEXTROSE 50 % IV SOLN: 0.0000 mL | INTRAVENOUS | Status: DC | PRN

## 2024-01-13 MED ORDER — ORAL CARE MOUTH RINSE
15.0000 mL | OROMUCOSAL | Status: DC
  Administered 2024-01-13 – 2024-01-15 (×6): 15 mL via OROMUCOSAL

## 2024-01-13 MED ORDER — EPINEPHRINE HCL 5 MG/250ML IV SOLN IN NS
INTRAVENOUS | Status: AC
  Filled 2024-01-13: qty 250

## 2024-01-13 MED ORDER — INSULIN ASPART 100 UNIT/ML IJ SOLN
5.0000 [IU] | INTRAMUSCULAR | Status: DC
  Administered 2024-01-13: 5 [IU] via SUBCUTANEOUS
  Filled 2024-01-13: qty 5

## 2024-01-13 MED ORDER — INSULIN GLARGINE 100 UNIT/ML ~~LOC~~ SOLN
20.0000 [IU] | Freq: Every day | SUBCUTANEOUS | Status: DC
  Administered 2024-01-13: 20 [IU] via SUBCUTANEOUS
  Filled 2024-01-13 (×2): qty 0.2

## 2024-01-13 MED ORDER — ORAL CARE MOUTH RINSE: 15.0000 mL | OROMUCOSAL | Status: DC | PRN

## 2024-01-13 NOTE — Progress Notes (Signed)
 NAME:  Timothy Holloway, MRN:  [id number], DOB:  December 18, 1955, LOS: 8 ADMISSION DATE:  01/05/2024, CONSULTATION DATE:  01/08/24 REFERRING MD:  Raenelle POUR., CHIEF COMPLAINT:  s/p CABG   History of Present Illness:  Timothy Holloway is a 68 year old male with past medical history significant for ESRD on HD, T2DM, HTN, obesity, chronic anemia who presented to [facility] ED 01/05/24 after experiencing chest pain/pressure during dialysis. In the ED, troponins were elevated, he was started on a heparin  gtt and transferred to [facility] for a cardiac cath where he was found to have triple-vessel disease and CHF. TCTS to take patient for CABG 12/8. PCCM consulted for post-operative medical management.  OR course EBL: 1100 Cell saver 482 Products 0 Bypass time: 1hr 58 min  Clamp time 1hr 21 min   Pertinent Medical History:   Past Medical History:  Diagnosis Date   Anemia    Cataract    Chronic kidney disease (CKD), stage III (moderate) (HCC)    dr Faythe   Depression    Diabetes mellitus without complication (HCC)    DVT (deep venous thrombosis) (HCC)    as a child   ESRD on dialysis [name])    M-W-F   Hyperlipidemia    Hypertension    Impotence    Insomnia    Obesity    Steal syndrome as complication of dialysis access    Significant Hospital Events: Including procedures, antibiotic start and stop dates in addition to other pertinent events   12/5: Transfer from [facility]->cath showed triple-vessel disease/CHF 12/8: iHD in am Pre-op   CABG w/ Dr. Shyrl, successfully extubated.  12/9 no distress. Weaning NE 12/10 Concerns for CVA vs TIA yesterday, however when MAPs improved 12/11 Code stroke called yesterday, right hemiplegia, right eye visual changes- neuro s/s not responding to MAP, CT perfusion scan completed- [name], came back intubated after procedure, [name]   Interim History / Subjective:  Orthostatic and very weak when getting to chair. He denies complaints.    Objective    Blood pressure 132/71, pulse (!) 58, temperature (!) 97.5 F (36.4 C), resp. rate 16, height 6' (1.829 m), weight 130.6 kg, SpO2 100%.        Intake/Output Summary (Last 24 hours) at 01/13/2024 1651 Last data filed at 01/13/2024 1600 Gross per 24 hour  Intake 811.33 ml  Output 1764 ml  Net -952.67 ml   Filed Weights   01/11/24 0500 01/12/24 0500 01/13/24 0500  Weight: (!) 136.4 kg 131.1 kg 130.6 kg   Examination: General: chronically ill appearing man lying in bed in NAD HEENT: [place]/AT, eyes anicteric CV: S1S2, RRR GI: obese, soft, NT Extremities: less edema, TED hose Neuro: drowsy, later watching TV and maintaining alertness. Moving all extremities. Answering some questions.   BUN 26 Cr 2.8 WBC 12.2 H/H 7.8/24.8 Platelets 222 Brain MRI: multiple small acute infarcts scattered in brain.    Resolved Problem List:  Endotracheally intubated Assessment and Plan:   L ICA stenosis s/p TCAR Multiple embolic strokes -appreciate VVS management -con't DAPT, statin -appreciate Neurology's management; suspect he will have a long rehab course -holding CRRT today due to orthostasis and better volume status -Con't midodrine  10mg  Q8h  3 vessel CAD with NSTEMI s/p CABGx3  Acute systolic heart failure -statin, DAPT -holding Bblocker due to orthostasis  Orthostasis -TED hose, chair position in bed as much as possible -midodrine  10mg  TID  Acute encephalopathy, embolic strokes, post-anesthesia. More alert later in the day. -delirium  precautions, maintain normal sleep wake cycle -limit sedating meds as much as possible  Leukocytosis; persistent -trend  Expected post-operative ABLA -no need for transfusion today -aranesp   ESRD on HD -holding CRRT today  -strict I/O -renally dose meds -will decide iHD vs back on CRRT when needed based on hemodynamics -fluid restriction  T2DM, A1c 9.9 -Glargine uncreased to 20 units daily -increase TF coverage to 5  units q4h -SSI PRN -goal BG 140-180  Deconditioning -PT, OT; limited with orthostatic hypotension -anticipate need for CIR   This patient is critically ill with multiple organ system failure which requires frequent high complexity decision making, assessment, support, evaluation, and titration of therapies. This was completed through the application of advanced monitoring technologies and extensive interpretation of multiple databases. During this encounter critical care time was devoted to patient care services described in this note for 38 minutes.  [name], DO 01/13/2024 5:12 PM [facility]  For contact information, see Amion. If no response to pager, please call PCCM consult pager. After hours, 7PM- 7AM, please call Elink.

## 2024-01-13 NOTE — Progress Notes (Addendum)
 [facility] Progress Note   Subjective:    Net neg 2.1 L yesterday Wt down at 130.6 kg 2L [place]  Objective Vitals:   01/13/24 0400 01/13/24 0500 01/13/24 0600 01/13/24 0700  BP: 126/69     Pulse: 66 63 65 65  Resp: 14 13 12 14   Temp:      TempSrc:      SpO2: 97% 97% 96% 98%  Weight:  130.6 kg    Height:       Physical Exam General: alert, nad, 2 L [place] O2 Heart: RRR Lungs: CTAB, nml WOB on RA Abdomen: soft, NTND Extremities: trace LE edema Dialysis Access: [facility]  Neuro: alert, responsive    Dialysis Orders: [facility] MWF 4h B350  133.5kg  TDC  Heparin  2000+ 1400u/hr Calcitriol 1.25 three times per week Mircera 90 mcg q 2 wks (last 12/1)    CXR 12/11-> resolved vasc congestion, improved IS edema   Assessment/Plan: NSTEMI - 3V CAD by LHC -> s/p CABG 12/08 Stroke-like symptoms: was waxing/ waning per neuro, no acute CVA by imaging, but advanced carotid disease by CTA -> underwent L carotid artery stent per VVS 12/10 overnight Hypotension: remains off pressors  ESRD - usual HD MWF. CRRT started on 12/10. Have d/w CCM, will put CRRT on hold for next 24 hrs and reassess tomorrow.  Access- Using [facility]. H/o failed AVF x 2.  Volume - vasc congestion resolved last CXR. On 2L [place]. 2 L net neg yesterday. 3kg under dry wt today.   Anemia - Hgb 8.6. follow post op trend. Hb 7- 8 -> will resume ESA here (last op esa 12/01) w/ darbe 100 mcg weekly. No iron with high ferritin.  Transfuse prn.  Nutrition - Renal diet with fluid restriction    Myer Fret  MD  [facility] 01/13/2024, 7:44 AM  Recent Labs  Lab 01/12/24 1615 01/12/24 1900 01/13/24 0330  HGB  --  7.9* 7.8*  ALBUMIN  2.0*  --  2.0*  CALCIUM  7.9*  --  8.0*  PHOS 4.0  --  3.2  3.2  CREATININE 3.06*  --  2.80*  K 4.4  --  4.3    Inpatient medications:  acetaminophen   1,000 mg Oral Q6H   Or   acetaminophen  (TYLENOL ) oral liquid 160 mg/5 mL  1,000 mg Per Tube Q6H   aspirin  EC  81 mg Oral Daily    atorvastatin   80 mg Oral Daily   bisacodyl   10 mg Oral Daily   Or   bisacodyl   10 mg Rectal Daily   Chlorhexidine  Gluconate Cloth  6 each Topical Daily   clopidogrel   75 mg Oral Daily   darbepoetin (ARANESP ) injection - DIALYSIS  100 mcg Subcutaneous Q Thu-1800   EPINEPHrine  NaCl       feeding supplement (PROSource TF20)  60 mL Per Tube BID   heparin  injection (subcutaneous)  5,000 Units Subcutaneous Q8H   insulin  aspart  2-6 Units Subcutaneous Q4H   insulin  aspart  3 Units Subcutaneous Q4H   insulin  glargine  15 Units Subcutaneous Daily   lactulose   20 g Oral BID   lidocaine   2 patch Transdermal Q24H   midodrine   10 mg Oral Q8H   multivitamin  1 tablet Oral QHS   mouth rinse  15 mL Mouth Rinse Q2H   pantoprazole  (PROTONIX ) IV  40 mg Intravenous Q24H   sodium chloride  flush  10-40 mL Intracatheter Q12H   sodium chloride  flush  3 mL Intravenous Q12H  albumin  human Stopped (01/09/24 0043)   feeding supplement (VITAL 1.5 CAL) 30 mL/hr at 01/13/24 0700   heparin  10,000 units/ 20 mL infusion syringe 750 Units/hr (01/13/24 0708)   norepinephrine  (LEVOPHED ) Adult infusion Stopped (01/11/24 1717)   prismasol  BGK 4/2.5 1,400 mL/hr at 01/13/24 0650   prismasol  BGK 4/2.5 400 mL/hr at 01/13/24 0200   prismasol  BGK 4/2.5 400 mL/hr at 01/13/24 0159   albumin  human, albuterol , EPINEPHrine  NaCl, fentaNYL  (SUBLIMAZE ) injection, heparin , ondansetron  (ZOFRAN ) IV, mouth rinse, oxyCODONE , sodium chloride  flush, sodium chloride  flush, traMADol , zolpidem 

## 2024-01-13 NOTE — Progress Notes (Signed)
° °  [address], [place] [address]             [phone]    POD # 5/3  Resting in bed  BP 132/71   Pulse (!) 58   Temp (!) 97.5 F (36.4 C)   Resp 16   Ht 6' (1.829 m)   Wt 130.6 kg   SpO2 100%   BMI 39.05 kg/m    Intake/Output Summary (Last 24 hours) at 01/13/2024 1750 Last data filed at 01/13/2024 1600 Gross per 24 hour  Intake 731.33 ml  Output 1634 ml  Net -902.67 ml   CBG remain moderately elevated   Elspeth C. Kerrin, MD [facility] [phone]

## 2024-01-13 NOTE — Progress Notes (Signed)
 3 Days Post-Op Procedures (LRB): TRANSCAROTID ARTERY REVASCULARIZATION (TCAR) (Left) Subjective: lethargic  Objective: Vital signs in last 24 hours: Temp:  [97.5 F (36.4 C)-98.7 F (37.1 C)] 98.6 F (37 C) (12/13 0320) Pulse Rate:  [59-71] 64 (12/13 0830) Cardiac Rhythm: Bundle branch block;Normal sinus rhythm (12/13 0800) Resp:  [10-25] 21 (12/13 0830) BP: (107-152)/(66-75) 133/67 (12/13 0800) SpO2:  [92 %-100 %] 94 % (12/13 0830) Arterial Line BP: (97-161)/(27-49) 105/27 (12/13 0830) Weight:  [130.6 kg] 130.6 kg (12/13 0500)  Hemodynamic parameters for last 24 hours:    Intake/Output from previous day: 12/12 0701 - 12/13 0700 In: 879.8 [P.O.:110; I.V.:21.2; Blood:315; NG/GT:433.7] Out: 2995 [Urine:35] Intake/Output this shift: Total I/O In: 30 [NG/GT:30] Out: 101   General appearance: ill-appearing Neurologic: lethargic but no obvious focal deficit Heart: regular rate and rhythm Lungs: diminished breath sounds bibasilar Abdomen: normal findings: soft, non-tender  Lab Results: Recent Labs    01/12/24 1040 01/12/24 1900 01/13/24 0330 01/13/24 0729  WBC 12.4*  --  12.2*  --   HGB 7.0*   < > 7.8* 8.0*  HCT 22.0*   < > 24.8* 24.8*  PLT 216  --  222  --    < > = values in this interval not displayed.   BMET:  Recent Labs    01/12/24 1615 01/13/24 0330  NA 133* 137  K 4.4 4.3  CL 102 102  CO2 25 26  GLUCOSE 244* 203*  BUN 24* 26*  CREATININE 3.06* 2.80*  CALCIUM  7.9* 8.0*    PT/INR: No results for input(s): LABPROT, INR in the last 72 hours. ABG    Component Value Date/Time   PHART 7.430 01/12/2024 1028   HCO3 22.9 01/12/2024 1028   TCO2 24 01/12/2024 1028   ACIDBASEDEF 1.0 01/12/2024 1028   O2SAT 95 01/12/2024 1028   CBG (last 3)  Recent Labs    01/12/24 2319 01/13/24 0326 01/13/24 0738  GLUCAP 187* 195* 227*    Assessment/Plan: S/P Procedures (LRB): TRANSCAROTID ARTERY REVASCULARIZATION (TCAR) (Left) Slow to progress NEURO- RIND  with urgent TCAR for tight LICA stenosis  PT/OT, Neuro following  Lethargic but follows commands with equal strength CV- in Sr with 1st degree block  Orthostatic hypotension persists  On midodrine , no beta blocker  Statin, ASA + clopidogrel  RESP- continue IS, PRN bronchodilators RENAL- ESRD, currently on CRRT  Will stop CRRT and go back to intermittent HD ENDO- CBG persistently elevated  Increase Lantus    Continue SSI GI- on tube feedings, + BM this AM Anemia- Hgb 8, monitor SCD + SQ heparin  for DVT prophylaxis Deconditioning- severe  Mobilize as tolerated, limited by orthostatic hypotension   LOS: 8 days    Timothy Holloway 01/13/2024

## 2024-01-13 NOTE — Plan of Care (Signed)
 Problem: Education: Goal: Knowledge of General Education information will improve Description: Including pain rating scale, medication(s)/side effects and non-pharmacologic comfort measures Outcome: Progressing   Problem: Health Behavior/Discharge Planning: Goal: Ability to manage health-related needs will improve Outcome: Progressing   Problem: Clinical Measurements: Goal: Ability to maintain clinical measurements within normal limits will improve Outcome: Progressing Goal: Will remain free from infection Outcome: Progressing Goal: Diagnostic test results will improve Outcome: Progressing Goal: Respiratory complications will improve Outcome: Progressing Goal: Cardiovascular complication will be avoided Outcome: Progressing   Problem: Nutrition: Goal: Adequate nutrition will be maintained Outcome: Progressing   Problem: Coping: Goal: Level of anxiety will decrease Outcome: Progressing   Problem: Elimination: Goal: Will not experience complications related to bowel motility Outcome: Progressing Goal: Will not experience complications related to urinary retention Outcome: Progressing   Problem: Pain Managment: Goal: General experience of comfort will improve and/or be controlled Outcome: Progressing   Problem: Education: Goal: Ability to describe self-care measures that may prevent or decrease complications (Diabetes Survival Skills Education) will improve Outcome: Progressing Goal: Individualized Educational Video(s) Outcome: Progressing   Problem: Coping: Goal: Ability to adjust to condition or change in health will improve Outcome: Progressing   Problem: Fluid Volume: Goal: Ability to maintain a balanced intake and output will improve Outcome: Progressing   Problem: Health Behavior/Discharge Planning: Goal: Ability to identify and utilize available resources and services will improve Outcome: Progressing Goal: Ability to manage health-related needs will  improve Outcome: Progressing   Problem: Metabolic: Goal: Ability to maintain appropriate glucose levels will improve Outcome: Progressing   Problem: Nutritional: Goal: Maintenance of adequate nutrition will improve Outcome: Progressing   Problem: Tissue Perfusion: Goal: Adequacy of tissue perfusion will improve Outcome: Progressing   Problem: Cardiovascular: Goal: Ability to achieve and maintain adequate cardiovascular perfusion will improve Outcome: Progressing Goal: Vascular access site(s) Level 0-1 will be maintained Outcome: Progressing   Problem: Health Behavior/Discharge Planning: Goal: Ability to safely manage health-related needs after discharge will improve Outcome: Progressing   Problem: Education: Goal: Will demonstrate proper wound care and an understanding of methods to prevent future damage Outcome: Progressing Goal: Knowledge of disease or condition will improve Outcome: Progressing Goal: Knowledge of the prescribed therapeutic regimen will improve Outcome: Progressing Goal: Individualized Educational Video(s) Outcome: Progressing   Problem: Cardiac: Goal: Will achieve and/or maintain hemodynamic stability Outcome: Progressing   Problem: Clinical Measurements: Goal: Postoperative complications will be avoided or minimized Outcome: Progressing   Problem: Skin Integrity: Goal: Wound healing without signs and symptoms of infection Outcome: Progressing   Problem: Education: Goal: Knowledge of disease or condition will improve Outcome: Progressing Goal: Knowledge of secondary prevention will improve (MUST DOCUMENT ALL) Outcome: Progressing Goal: Knowledge of patient specific risk factors will improve (DELETE if not current risk factor) Outcome: Progressing   Problem: Activity: Goal: Risk for activity intolerance will decrease Outcome: Not Progressing   Problem: Safety: Goal: Ability to remain free from injury will improve Outcome: Not  Progressing   Problem: Skin Integrity: Goal: Risk for impaired skin integrity will decrease Outcome: Not Progressing   Problem: Nutritional: Goal: Progress toward achieving an optimal weight will improve Outcome: Not Progressing   Problem: Skin Integrity: Goal: Risk for impaired skin integrity will decrease Outcome: Not Progressing   Problem: Activity: Goal: Ability to return to baseline activity level will improve Outcome: Not Progressing   Problem: Activity: Goal: Risk for activity intolerance will decrease Outcome: Not Progressing   Problem: Respiratory: Goal: Respiratory status will improve Outcome: Not Progressing  Problem: Skin Integrity: Goal: Risk for impaired skin integrity will decrease Outcome: Not Progressing   Problem: Urinary Elimination: Goal: Ability to achieve and maintain adequate renal perfusion and functioning will improve Outcome: Not Progressing

## 2024-01-14 ENCOUNTER — Inpatient Hospital Stay (HOSPITAL_COMMUNITY)

## 2024-01-14 DIAGNOSIS — I5021 Acute systolic (congestive) heart failure: Secondary | ICD-10-CM | POA: Diagnosis not present

## 2024-01-14 DIAGNOSIS — I214 Non-ST elevation (NSTEMI) myocardial infarction: Secondary | ICD-10-CM | POA: Diagnosis not present

## 2024-01-14 DIAGNOSIS — N186 End stage renal disease: Secondary | ICD-10-CM | POA: Diagnosis not present

## 2024-01-14 DIAGNOSIS — Z992 Dependence on renal dialysis: Secondary | ICD-10-CM | POA: Diagnosis not present

## 2024-01-14 DIAGNOSIS — E119 Type 2 diabetes mellitus without complications: Secondary | ICD-10-CM | POA: Diagnosis not present

## 2024-01-14 LAB — GLUCOSE, CAPILLARY
Glucose-Capillary: 108 mg/dL — ABNORMAL HIGH (ref 70–99)
Glucose-Capillary: 145 mg/dL — ABNORMAL HIGH (ref 70–99)
Glucose-Capillary: 171 mg/dL — ABNORMAL HIGH (ref 70–99)
Glucose-Capillary: 181 mg/dL — ABNORMAL HIGH (ref 70–99)
Glucose-Capillary: 183 mg/dL — ABNORMAL HIGH (ref 70–99)
Glucose-Capillary: 203 mg/dL — ABNORMAL HIGH (ref 70–99)
Glucose-Capillary: 204 mg/dL — ABNORMAL HIGH (ref 70–99)
Glucose-Capillary: 208 mg/dL — ABNORMAL HIGH (ref 70–99)
Glucose-Capillary: 232 mg/dL — ABNORMAL HIGH (ref 70–99)
Glucose-Capillary: 237 mg/dL — ABNORMAL HIGH (ref 70–99)
Glucose-Capillary: 280 mg/dL — ABNORMAL HIGH (ref 70–99)
Glucose-Capillary: 280 mg/dL — ABNORMAL HIGH (ref 70–99)
Glucose-Capillary: 307 mg/dL — ABNORMAL HIGH (ref 70–99)
Glucose-Capillary: 317 mg/dL — ABNORMAL HIGH (ref 70–99)
Glucose-Capillary: 321 mg/dL — ABNORMAL HIGH (ref 70–99)
Glucose-Capillary: 328 mg/dL — ABNORMAL HIGH (ref 70–99)
Glucose-Capillary: 338 mg/dL — ABNORMAL HIGH (ref 70–99)
Glucose-Capillary: 339 mg/dL — ABNORMAL HIGH (ref 70–99)
Glucose-Capillary: 350 mg/dL — ABNORMAL HIGH (ref 70–99)
Glucose-Capillary: 72 mg/dL (ref 70–99)
Glucose-Capillary: 97 mg/dL (ref 70–99)

## 2024-01-14 LAB — CBC
HCT: 24.1 % — ABNORMAL LOW (ref 39.0–52.0)
Hemoglobin: 7.6 g/dL — ABNORMAL LOW (ref 13.0–17.0)
MCH: 30.8 pg (ref 26.0–34.0)
MCHC: 31.5 g/dL (ref 30.0–36.0)
MCV: 97.6 fL (ref 80.0–100.0)
Platelets: 255 K/uL (ref 150–400)
RBC: 2.47 MIL/uL — ABNORMAL LOW (ref 4.22–5.81)
RDW: 16.6 % — ABNORMAL HIGH (ref 11.5–15.5)
WBC: 14.5 K/uL — ABNORMAL HIGH (ref 4.0–10.5)
nRBC: 0.3 % — ABNORMAL HIGH (ref 0.0–0.2)

## 2024-01-14 LAB — RENAL FUNCTION PANEL
Albumin: 2 g/dL — ABNORMAL LOW (ref 3.5–5.0)
Anion gap: 14 (ref 5–15)
BUN: 52 mg/dL — ABNORMAL HIGH (ref 8–23)
CO2: 26 mmol/L (ref 22–32)
Calcium: 8.3 mg/dL — ABNORMAL LOW (ref 8.9–10.3)
Chloride: 98 mmol/L (ref 98–111)
Creatinine, Ser: 4.2 mg/dL — ABNORMAL HIGH (ref 0.61–1.24)
GFR, Estimated: 14 mL/min — ABNORMAL LOW (ref >60)
Glucose, Bld: 197 mg/dL — ABNORMAL HIGH (ref 70–99)
Phosphorus: 3.6 mg/dL (ref 2.5–4.6)
Potassium: 4.4 mmol/L (ref 3.5–5.1)
Sodium: 138 mmol/L (ref 135–145)

## 2024-01-14 LAB — MAGNESIUM: Magnesium: 2.8 mg/dL — ABNORMAL HIGH (ref 1.7–2.4)

## 2024-01-14 LAB — PHOSPHORUS: Phosphorus: 3.6 mg/dL (ref 2.5–4.6)

## 2024-01-14 LAB — APTT: aPTT: 42 s — ABNORMAL HIGH (ref 24–36)

## 2024-01-14 LAB — TRIGLYCERIDES: Triglycerides: 103 mg/dL (ref <150)

## 2024-01-14 MED ORDER — VITAL 1.5 CAL PO LIQD
1000.0000 mL | ORAL | Status: DC
  Administered 2024-01-15: 05:00:00 1000 mL

## 2024-01-14 MED ORDER — INSULIN GLARGINE 100 UNIT/ML ~~LOC~~ SOLN: 40.0000 [IU] | Freq: Every day | SUBCUTANEOUS | Status: DC

## 2024-01-14 MED ORDER — INSULIN REGULAR(HUMAN) IN NACL 100-0.9 UT/100ML-% IV SOLN
INTRAVENOUS | Status: DC
  Administered 2024-01-14: 11.5 [IU]/h via INTRAVENOUS
  Filled 2024-01-14: qty 100

## 2024-01-14 MED ORDER — GERHARDT'S BUTT CREAM
TOPICAL_CREAM | Freq: Every day | CUTANEOUS | Status: DC
  Administered 2024-01-15: 10:00:00 1 via TOPICAL
  Filled 2024-01-14: qty 60

## 2024-01-14 MED ORDER — INSULIN ASPART 100 UNIT/ML IJ SOLN
3.0000 [IU] | Freq: Three times a day (TID) | INTRAMUSCULAR | Status: DC
  Administered 2024-01-14 – 2024-01-15 (×3): 9 [IU] via SUBCUTANEOUS
  Administered 2024-01-16: 07:00:00 3 [IU] via SUBCUTANEOUS
  Filled 2024-01-14: qty 9
  Filled 2024-01-14: qty 3
  Filled 2024-01-14: qty 9

## 2024-01-14 MED ORDER — INSULIN GLARGINE 100 UNIT/ML ~~LOC~~ SOLN
40.0000 [IU] | Freq: Every day | SUBCUTANEOUS | Status: DC
  Administered 2024-01-14 – 2024-01-15 (×2): 40 [IU] via SUBCUTANEOUS
  Filled 2024-01-14 (×3): qty 0.4

## 2024-01-14 MED ORDER — MIDODRINE HCL 5 MG PO TABS
5.0000 mg | ORAL_TABLET | Freq: Three times a day (TID) | ORAL | Status: DC
  Administered 2024-01-14 – 2024-01-15 (×3): 5 mg via ORAL
  Filled 2024-01-14 (×2): qty 1

## 2024-01-14 MED ORDER — CHLORHEXIDINE GLUCONATE CLOTH 2 % EX PADS
6.0000 | MEDICATED_PAD | Freq: Every day | CUTANEOUS | Status: DC
  Administered 2024-01-15 – 2024-01-17 (×3): 6 via TOPICAL

## 2024-01-14 MED ORDER — INSULIN ASPART 100 UNIT/ML IJ SOLN: 4.0000 [IU] | Freq: Three times a day (TID) | INTRAMUSCULAR | Status: DC

## 2024-01-14 NOTE — Progress Notes (Signed)
° °  [address], [place] [employer] [address]             [phone]   POD 6/ 4  BP (!) 143/39   Pulse 69   Temp 98 F (36.7 C) (Oral)   Resp 19   Ht 6' (1.829 m)   Wt 128.2 kg   SpO2 99%   BMI 38.33 kg/m   RA 100% sat   Intake/Output Summary (Last 24 hours) at 01/14/2024 1827 Last data filed at 01/14/2024 1820 Gross per 24 hour  Intake 1221.75 ml  Output 375 ml  Net 846.75 ml   Off insulin  drip now  Last CBG 72  Kalyani Maeda C. Kerrin, MD [facility] [phone]

## 2024-01-14 NOTE — Progress Notes (Signed)
 Physical Therapy Treatment Patient Details Name: Timothy Holloway MRN: [id number] DOB: 1955/07/08 Today's Date: 01/14/2024   History of Present Illness 68 yo M adm 12/5 to [facility] due to CP with troponin level 1121 and glucose 306. Workup for NSTEMI, transferred to [facility] 12/8 s/p CABGx3 12/9 facial droop R arm and leg weakness with hypotension with return to baseline incerase BP imaging negative for acute infarct,known L ICA stenosis. CRRT 12/10-12/13. Code stroke re-activated 12/10 for increased R sided weakness. 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 S/p emergent L TCAR 12/10. PMH ESRD on HD (MWF) DM2, obese, HTN HLD, depression, anemia insomnia CKD steal syndrome, R and L fistula ,back surg    PT Comments  RN and NT assisted PT throughout this session to ensure pt safety as pt reportedly had a syncopal even yesterday with nursing. BP monitored throughout via A-line with SBP noted to remain > 130 throughout (goal of > 100). TED hose were donned. An abdominal binder was also donned but quickly popped open when sitting EOB. He will likely need x2 abdominal binders connected together to wrap around his abdomen due to his body habitus. He did endorse mild lightheadedness with positional changes. He is displaying great functional progress as he was able to successfully stand with modAx3 and step pivot from bed to the chair without UE support with modAx2 (+third person for safety). His knees due to tend to flex and almost buckle when standing (L>R due to baseline L knee issues, crepitus noted), resulting in him needing cues and knee blocking with all standing mobility. He remains at high risk for falls. Will continue to follow acutely.     If plan is discharge home, recommend the following: Two people to help with walking and/or transfers;Two people to help with  bathing/dressing/bathroom;Assistance with cooking/housework;Assistance with feeding;Direct supervision/assist for medications management;Direct supervision/assist for financial management;Assist for transportation;Help with stairs or ramp for entrance   Can travel by private vehicle        Equipment Recommendations  Rolling walker (2 wheels);Hospital bed;Hoyer lift;BSC/3in1    Recommendations for Other Services       Precautions / Restrictions Precautions Precautions: Fall;Sternal Precaution Booklet Issued: No Recall of Precautions/Restrictions: Intact Precaution/Restrictions Comments: A-line, SBP > 100 goal, syncope with nursing 12/13, x2 abdominal binders, TED hose (x2 abdominal binders hooked together due to pt's body habitus) Restrictions Weight Bearing Restrictions Per Provider Order: No RUE Weight Bearing Per Provider Order: Non weight bearing LUE Weight Bearing Per Provider Order: Non weight bearing Other Position/Activity Restrictions: Sternal precautions     Mobility  Bed Mobility Overal bed mobility: Needs Assistance Bed Mobility: Rolling, Sidelying to Sit Rolling: Mod assist, +2 for physical assistance, +2 for safety/equipment Sidelying to sit: Mod assist, +2 for physical assistance, +2 for safety/equipment, HOB elevated       General bed mobility comments: +3 assist throughout session for safety due to pt reportedly having syncope with nursing 12/13. Cued pt to lift leg to push off bed to roll bil while hugging pillow, pt rolls better to R with modAx1 but needs modAx2 to roll to L. ModAx3 to ascend trunk and modAx1 to bring legs off EOB to sit up R EOB.    Transfers Overall transfer level: Needs assistance Equipment used: 1 person hand held assist Transfers: Sit to/from Stand, Bed to chair/wheelchair/BSC Sit to Stand: Mod assist, +2 physical assistance, +2 safety/equipment, From elevated surface  Step pivot transfers: Mod assist, +2 physical assistance, +2  safety/equipment       General transfer comment: +3 assist throughout session for safety due to pt reportedly having syncope with nursing 12/13. Cued pt to hug pillow and extend his legs to stand. Cued pt also to lean chest to therapist's shoulder to facilitate anterior weight shift to stand. x1 rep from elevated EOB and x1 rep from recliner. ModAx2-3 to physically power up to stand with bil knees blocked. ModAx2, +third person for safety, to balance and weight shift to step pivot to R from bed to chair. Knees blocked with cues to extend during stance phase.    Ambulation/Gait Ambulation/Gait assistance: Mod assist, +2 physical assistance, +2 safety/equipment Gait Distance (Feet): 2 Feet Assistive device: None Gait Pattern/deviations: Step-to pattern, Decreased step length - right, Decreased step length - left, Decreased stride length, Knee flexed in stance - right, Knee flexed in stance - left, Trunk flexed Gait velocity: reduced Gait velocity interpretation: <1.31 ft/sec, indicative of household ambulator   General Gait Details: ModAx2 +third person for safety to step pivot to R from bed to recliner with pt hugging a pillow. Noted bil knee flexion during stance, L>R due to chronic L knee issues. Verbal cues and blocking provided during stance phase to extend knees.   Stairs             Wheelchair Mobility     Tilt Bed    Modified Rankin (Stroke Patients Only)       Balance Overall balance assessment: Needs assistance Sitting-balance support: No upper extremity supported, Feet supported Sitting balance-Leahy Scale: Fair Sitting balance - Comments: CGA with intermittent minA for static sitting EOB, minA for dynamic sitting   Standing balance support: No upper extremity supported, During functional activity Standing balance-Leahy Scale: Poor Standing balance comment: ModAx2-3 to stand with bil knees blocked and no UE support                             Communication Communication Communication: No apparent difficulties  Cognition Arousal: Alert Behavior During Therapy: WFL for tasks assessed/performed   PT - Cognitive impairments: Sequencing, Problem solving, Attention                       PT - Cognition Comments: Needs cues to problem-solve and sequence all mobility Following commands: Intact      Cueing Cueing Techniques: Verbal cues, Tactile cues  Exercises      General Comments General comments (skin integrity, edema, etc.): A-line showing SBP > 130 throughout, pt did endorse mild lightheadedness with positional changes      Pertinent Vitals/Pain Pain Assessment Pain Assessment: Faces Faces Pain Scale: Hurts little more Pain Location: chest with movement Pain Descriptors / Indicators: Grimacing, Guarding, Operative site guarding Pain Intervention(s): Limited activity within patient's tolerance, Monitored during session, Repositioned    Home Living                          Prior Function            PT Goals (current goals can now be found in the care plan section) Acute Rehab PT Goals Patient Stated Goal: to improve PT Goal Formulation: With patient Time For Goal Achievement: 01/25/24 Potential to Achieve Goals: Fair Progress towards PT goals: Progressing toward goals    Frequency    Min 3X/week  PT Plan      Co-evaluation              AM-PAC PT 6 Clicks Mobility   Outcome Measure  Help needed turning from your back to your side while in a flat bed without using bedrails?: Total Help needed moving from lying on your back to sitting on the side of a flat bed without using bedrails?: Total Help needed moving to and from a bed to a chair (including a wheelchair)?: Total Help needed standing up from a chair using your arms (e.g., wheelchair or bedside chair)?: Total Help needed to walk in hospital room?: Total Help needed climbing 3-5 steps with a railing? : Total 6  Click Score: 6    End of Session Equipment Utilized During Treatment: Gait belt Activity Tolerance: Patient tolerated treatment well Patient left: with call bell/phone within reach;in chair;with chair alarm set;with nursing/sitter in room Nurse Communication: Mobility status;Other (comment) (RN and NT present throughout session to assist PT) PT Visit Diagnosis: Unsteadiness on feet (R26.81);Other abnormalities of gait and mobility (R26.89);Muscle weakness (generalized) (M62.81);Difficulty in walking, not elsewhere classified (R26.2);Pain     Time: 9085-9056 PT Time Calculation (min) (ACUTE ONLY): 29 min  Charges:    $Therapeutic Activity: 23-37 mins PT General Charges $$ ACUTE PT VISIT: 1 Visit                     Theo Ferretti, PT, DPT Acute Rehabilitation Services  Office: [phone]    Theo CHRISTELLA Ferretti 01/14/2024, 12:43 PM

## 2024-01-14 NOTE — Plan of Care (Signed)
°  Problem: Education: Goal: Knowledge of General Education information will improve Description: Including pain rating scale, medication(s)/side effects and non-pharmacologic comfort measures Outcome: Progressing   Problem: Health Behavior/Discharge Planning: Goal: Ability to manage health-related needs will improve Outcome: Progressing   Problem: Clinical Measurements: Goal: Ability to maintain clinical measurements within normal limits will improve Outcome: Progressing Goal: Will remain free from infection Outcome: Progressing   Problem: Activity: Goal: Risk for activity intolerance will decrease Outcome: Progressing   Problem: Skin Integrity: Goal: Risk for impaired skin integrity will decrease Outcome: Not Progressing   Problem: Education: Goal: Knowledge of disease or condition will improve Outcome: Progressing Goal: Knowledge of secondary prevention will improve (MUST DOCUMENT ALL) Outcome: Progressing Goal: Knowledge of patient specific risk factors will improve (DELETE if not current risk factor) Outcome: Progressing

## 2024-01-14 NOTE — Progress Notes (Signed)
 NAME:  Timothy Holloway, MRN:  [id number], DOB:  17-Nov-1955, LOS: 9 ADMISSION DATE:  01/05/2024, CONSULTATION DATE:  01/08/24 REFERRING MD:  Raenelle POUR., CHIEF COMPLAINT:  s/p CABG   History of Present Illness:  Timothy Holloway is a 68 year old male with past medical history significant for ESRD on HD, T2DM, HTN, obesity, chronic anemia who presented to [facility] ED 01/05/24 after experiencing chest pain/pressure during dialysis. In the ED, troponins were elevated, he was started on a heparin  gtt and transferred to [facility] for a cardiac cath where he was found to have triple-vessel disease and CHF. TCTS to take patient for CABG 12/8. PCCM consulted for post-operative medical management.  OR course EBL: 1100 Cell saver 482 Products 0 Bypass time: 1hr 58 min  Clamp time 1hr 21 min   Pertinent Medical History:   Past Medical History:  Diagnosis Date   Anemia    Cataract    Chronic kidney disease (CKD), stage III (moderate) (HCC)    dr Faythe   Depression    Diabetes mellitus without complication (HCC)    DVT (deep venous thrombosis) (HCC)    as a child   ESRD on dialysis [facility])    M-W-F   Hyperlipidemia    Hypertension    Impotence    Insomnia    Obesity    Steal syndrome as complication of dialysis access    Significant Hospital Events: Including procedures, antibiotic start and stop dates in addition to other pertinent events   12/5: Transfer from [facility]->cath showed triple-vessel disease/CHF 12/8: iHD in am Pre-op   CABG w/ Dr. Shyrl, successfully extubated.  12/9 no distress. Weaning NE 12/10 Concerns for CVA vs TIA yesterday, however when MAPs improved 12/11 Code stroke called yesterday, right hemiplegia, right eye visual changes- neuro s/s not responding to MAP, CT perfusion scan completed- [name], came back intubated after procedure, [name]   Interim History / Subjective:  Has not been OOB this morning. Has been up to chair position but gets  uncomfortable and wants to be more semi-recumbent. Insulin  infusion started overnight.  Objective    Blood pressure 133/79, pulse 73, temperature 98 F (36.7 C), temperature source Oral, resp. rate 17, height 6' (1.829 m), weight 128.2 kg, SpO2 98%.        Intake/Output Summary (Last 24 hours) at 01/14/2024 0713 Last data filed at 01/14/2024 0700 Gross per 24 hour  Intake 1225 ml  Output 299 ml  Net 926 ml   Filed Weights   01/12/24 0500 01/13/24 0500 01/14/24 0500  Weight: 131.1 kg 130.6 kg 128.2 kg   Examination: General: chronically ill appearing man sitting up in bed in NAD HEENT: [place]/AT, eyes anicteric CV: S1S2, RRR GI: obese, soft, NT Extremities: TED hose, mild edema Neuro: More awake today, watching TV. Moving all extremities but globally weak.  BUN 52 Cr 4.2 WBC 14.5 H/H 7.6/24.1 Platelets 255  Brain MRI: multiple small acute infarcts scattered in brain.    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 Better today. -delirium precautions -HD per nephro-- not sure if he will tolerate iHD vs need to go back on CRRT  Leukocytosis; worsening, but physical exam looks better. Afebrile.  -trend  Expected post-operative ABLA -aranesp  -transfuse for Hb <7 or hemodynamically significant bleeding  ESRD on HD -had TDC from [facility] -strict I/O -renally dose meds -iHD vs CRRT when he needs his next  session depending on how his neuro symptoms and BP are doing.   -fluid restriction  T2DM, A1c 9.9. Uncontrolled hyperglycemia on TF after coming off CRRT -Glargine uncreased to 40 units daily to try to get her back off insulin  gtt -insulin  gtt -SSI PRN -goal BG 140-180  Deconditioning -PT, OT; RN to call to get help with moving him -anticipate need for CIR  Timothy SHAUNNA Gaskins, DO 01/14/2024 10:28 AM [facility]  For contact information, see Amion. If no response to pager, please call PCCM consult pager. After hours, 7PM- 7AM, please call Elink.

## 2024-01-14 NOTE — Progress Notes (Signed)
 4 Days Post-Op Procedures (LRB): TRANSCAROTID ARTERY REVASCULARIZATION (TCAR) (Left) Subjective: Up in chair, denies incisional pain, c/o irritation from feeding tube  Objective: Vital signs in last 24 hours: Temp:  [97.5 F (36.4 C)-98.5 F (36.9 C)] 98 F (36.7 C) (12/14 0324) Pulse Rate:  [54-141] 70 (12/14 0900) Cardiac Rhythm: Normal sinus rhythm;Heart block (12/14 0800) Resp:  [6-20] 17 (12/14 0900) BP: (111-144)/(29-82) 122/41 (12/14 0900) SpO2:  [92 %-100 %] 99 % (12/14 0900) Arterial Line BP: (127-186)/(35-62) 168/37 (12/14 0900) Weight:  [128.2 kg] 128.2 kg (12/14 0500)  Hemodynamic parameters for last 24 hours:    Intake/Output from previous day: 12/13 0701 - 12/14 0700 In: 1225 [I.V.:68; NG/GT:1157] Out: 299 [Urine:200] Intake/Output this shift: Total I/O In: 58.1 [I.V.:3.1; NG/GT:55] Out: -   General appearance: alert, cooperative, and no distress Neurologic: intact Heart: regular rate and rhythm Lungs: diminished breath sounds bibasilar Abdomen: normal findings: soft, non-tender Wound: intact  Lab Results: Recent Labs    01/13/24 0330 01/13/24 0729 01/14/24 0431  WBC 12.2*  --  14.5*  HGB 7.8* 8.0* 7.6*  HCT 24.8* 24.8* 24.1*  PLT 222  --  255   BMET:  Recent Labs    01/13/24 0330 01/14/24 0431  NA 137 138  K 4.3 4.4  CL 102 98  CO2 26 26  GLUCOSE 203* 197*  BUN 26* 52*  CREATININE 2.80* 4.20*  CALCIUM  8.0* 8.3*    PT/INR: No results for input(s): LABPROT, INR in the last 72 hours. ABG    Component Value Date/Time   PHART 7.430 01/12/2024 1028   HCO3 22.9 01/12/2024 1028   TCO2 24 01/12/2024 1028   ACIDBASEDEF 1.0 01/12/2024 1028   O2SAT 95 01/12/2024 1028   CBG (last 3)  Recent Labs    01/14/24 0730 01/14/24 0802 01/14/24 0907  GLUCAP 181* 203* 280*    Assessment/Plan: S/P Procedures (LRB): TRANSCAROTID ARTERY REVASCULARIZATION (TCAR) (Left) POD # 6/ 4 NEURO- intact CV- in SR  On ASA and Plavix   Lipitor  Beta  blockers stopped RESP- continue IS RENAL- ESRD  For HD tomorrow ENDO- CBG markedly elevated requiring insulin  drip GI- taking full liquids and on goal TF  Combination resulting in hyperglycemia  Adjust tube feeding rate downward SCD and SQ heparin  for DVT prophylaxis Deconditioning- mobilize Anemia- hgb stable   LOS: 9 days    Timothy Holloway 01/14/2024

## 2024-01-14 NOTE — Progress Notes (Addendum)
 Timothy Holloway Progress Note   Subjective:    Net I/O yest = 0.9 L + Seen up in bed, feeding himself breakfast No cough or SOB  Objective Vitals:   01/14/24 0500 01/14/24 0600 01/14/24 0700 01/14/24 0800  BP: (!) 144/29 (!) 140/39 133/79 (!) 129/31  Pulse: 65 66 73 72  Resp: 12 13 17 16   Temp:      TempSrc:      SpO2: 96% 95% 98% 98%  Weight: 128.2 kg     Height:       Physical Exam General: alert, nad, 2 L  O2 Heart: RRR Lungs: CTAB, nml WOB on RA Abdomen: soft, NTND Extremities: trace LE edema Dialysis Access: [facility]  Neuro: alert, responsive    Dialysis Orders: [facility] MWF 4h B350  133.5kg  TDC  Heparin  2000+ 1400u/hr Calcitriol 1.25 three times per week Mircera 90 mcg q 2 wks (last 12/1)    CXR 12/11-> resolved vasc congestion, improved IS edema   Assessment/Plan: ESRD - usual HD MWF. CRRT used from 12/10 - 12/13, dc'd since no further was vol overloaded and came off of pressors. No RRT needed today. Plan HD Monday in ICU, gentle HD w/ low max UF of 2 L.  NSTEMI - 3V CAD by LHC -> s/p CABG 12/08 Stroke-like symptoms: was waxing/ waning per neuro, no acute CVA by imaging, but due to advanced carotid disease by CTA pt underwent L carotid artery stent per [facility] 12/10.  Hypotension: resolved, bp's stable 130/85 range Volume - vasc congestion resolved w/ last CXR. Was on 2L, now on RA. Several kg under dry wt.  Anemia - Hgb 7- 9 recently. Getting ESA here w/ darbe 100 mcg weekly. No iron with high ferritin.  Transfuse prn.  Nutrition - Renal diet with fluid restriction    Myer Fret  MD  [facility] 01/14/2024, 8:25 AM  Recent Labs  Lab 01/13/24 0330 01/13/24 0729 01/14/24 0431  HGB 7.8* 8.0* 7.6*  ALBUMIN  2.0*  --  2.0*  CALCIUM  8.0*  --  8.3*  PHOS 3.2  3.2  --  3.6  3.6  CREATININE 2.80*  --  4.20*  K 4.3  --  4.4    Inpatient medications:  aspirin  EC  81 mg Oral Daily   atorvastatin   80 mg Oral Daily   bisacodyl   10 mg Oral Daily   Or    bisacodyl   10 mg Rectal Daily   Chlorhexidine  Gluconate Cloth  6 each Topical Daily   clopidogrel   75 mg Oral Daily   darbepoetin (ARANESP ) injection - DIALYSIS  100 mcg Subcutaneous Q Thu-1800   feeding supplement (PROSource TF20)  60 mL Per Tube BID   heparin  injection (subcutaneous)  5,000 Units Subcutaneous Q8H   insulin  glargine  20 Units Subcutaneous Daily   lactulose   20 g Oral BID   lidocaine   2 patch Transdermal Q24H   midodrine   10 mg Oral Q8H   multivitamin  1 tablet Oral QHS   mouth rinse  15 mL Mouth Rinse 4 times per day   pantoprazole  (PROTONIX ) IV  40 mg Intravenous Q24H   sodium chloride  flush  10-40 mL Intracatheter Q12H   sodium chloride  flush  3 mL Intravenous Q12H    albumin  human Stopped (01/09/24 0043)   feeding supplement (VITAL 1.5 CAL) 55 mL/hr at 01/14/24 0800   heparin  10,000 units/ 20 mL infusion syringe 750 Units/hr (01/13/24 0708)   insulin  4.4 Units/hr (01/14/24 0800)   norepinephrine  (LEVOPHED )  Adult infusion Stopped (01/11/24 1717)   prismasol  BGK 4/2.5 1,400 mL/hr at 01/13/24 0650   prismasol  BGK 4/2.5 400 mL/hr at 01/13/24 0200   prismasol  BGK 4/2.5 400 mL/hr at 01/13/24 0159   albumin  human, albuterol , dextrose , fentaNYL  (SUBLIMAZE ) injection, heparin , ondansetron  (ZOFRAN ) IV, mouth rinse, oxyCODONE , sodium chloride  flush, sodium chloride  flush, traMADol , zolpidem 

## 2024-01-15 DIAGNOSIS — E46 Unspecified protein-calorie malnutrition: Secondary | ICD-10-CM

## 2024-01-15 DIAGNOSIS — D649 Anemia, unspecified: Secondary | ICD-10-CM

## 2024-01-15 DIAGNOSIS — E1165 Type 2 diabetes mellitus with hyperglycemia: Secondary | ICD-10-CM

## 2024-01-15 LAB — GLUCOSE, CAPILLARY
Glucose-Capillary: 119 mg/dL — ABNORMAL HIGH (ref 70–99)
Glucose-Capillary: 126 mg/dL — ABNORMAL HIGH (ref 70–99)
Glucose-Capillary: 133 mg/dL — ABNORMAL HIGH (ref 70–99)
Glucose-Capillary: 217 mg/dL — ABNORMAL HIGH (ref 70–99)
Glucose-Capillary: 251 mg/dL — ABNORMAL HIGH (ref 70–99)
Glucose-Capillary: 262 mg/dL — ABNORMAL HIGH (ref 70–99)
Glucose-Capillary: 270 mg/dL — ABNORMAL HIGH (ref 70–99)
Glucose-Capillary: 274 mg/dL — ABNORMAL HIGH (ref 70–99)
Glucose-Capillary: 99 mg/dL (ref 70–99)

## 2024-01-15 LAB — CBC
HCT: 24.2 % — ABNORMAL LOW (ref 39.0–52.0)
Hemoglobin: 7.5 g/dL — ABNORMAL LOW (ref 13.0–17.0)
MCH: 30.4 pg (ref 26.0–34.0)
MCHC: 31 g/dL (ref 30.0–36.0)
MCV: 98 fL (ref 80.0–100.0)
Platelets: 294 K/uL (ref 150–400)
RBC: 2.47 MIL/uL — ABNORMAL LOW (ref 4.22–5.81)
RDW: 16.8 % — ABNORMAL HIGH (ref 11.5–15.5)
WBC: 15.9 K/uL — ABNORMAL HIGH (ref 4.0–10.5)
nRBC: 0.1 % (ref 0.0–0.2)

## 2024-01-15 LAB — PHOSPHORUS: Phosphorus: 4 mg/dL (ref 2.5–4.6)

## 2024-01-15 LAB — MAGNESIUM: Magnesium: 2.7 mg/dL — ABNORMAL HIGH (ref 1.7–2.4)

## 2024-01-15 LAB — RENAL FUNCTION PANEL
Albumin: 1.9 g/dL — ABNORMAL LOW (ref 3.5–5.0)
Anion gap: 10 (ref 5–15)
BUN: 79 mg/dL — ABNORMAL HIGH (ref 8–23)
CO2: 21 mmol/L — ABNORMAL LOW (ref 22–32)
Calcium: 8.1 mg/dL — ABNORMAL LOW (ref 8.9–10.3)
Chloride: 105 mmol/L (ref 98–111)
Creatinine, Ser: 5.49 mg/dL — ABNORMAL HIGH (ref 0.61–1.24)
GFR, Estimated: 10 mL/min — ABNORMAL LOW (ref >60)
Glucose, Bld: 92 mg/dL (ref 70–99)
Phosphorus: 4.2 mg/dL (ref 2.5–4.6)
Potassium: 4.6 mmol/L (ref 3.5–5.1)
Sodium: 136 mmol/L (ref 135–145)

## 2024-01-15 LAB — APTT: aPTT: 40 s — ABNORMAL HIGH (ref 24–36)

## 2024-01-15 MED ORDER — ALTEPLASE 2 MG IJ SOLR: 2.0000 mg | Freq: Once | INTRAMUSCULAR | Status: DC | PRN

## 2024-01-15 MED ORDER — LIDOCAINE-PRILOCAINE 2.5-2.5 % EX CREA: 1.0000 | TOPICAL_CREAM | CUTANEOUS | Status: DC | PRN

## 2024-01-15 MED ORDER — HEPARIN SODIUM (PORCINE) 1000 UNIT/ML DIALYSIS: 20.0000 [IU]/kg | INTRAMUSCULAR | Status: DC | PRN

## 2024-01-15 MED ORDER — ANTICOAGULANT SODIUM CITRATE 4% (200MG/5ML) IV SOLN: 5.0000 mL | Status: DC | PRN

## 2024-01-15 MED ORDER — PENTAFLUOROPROP-TETRAFLUOROETH EX AERO: 1.0000 | INHALATION_SPRAY | CUTANEOUS | Status: DC | PRN

## 2024-01-15 MED ORDER — ALBUMIN HUMAN 25 % IV SOLN
50.0000 g | Freq: Once | INTRAVENOUS | Status: AC | PRN
  Administered 2024-01-15: 18:00:00 50 g via INTRAVENOUS

## 2024-01-15 MED ORDER — INSULIN ASPART 100 UNIT/ML IJ SOLN
5.0000 [IU] | Freq: Three times a day (TID) | INTRAMUSCULAR | Status: DC
  Administered 2024-01-15 – 2024-01-18 (×10): 5 [IU] via SUBCUTANEOUS
  Filled 2024-01-15 (×8): qty 5

## 2024-01-15 MED ORDER — LIDOCAINE HCL (PF) 1 % IJ SOLN: 5.0000 mL | INTRAMUSCULAR | Status: DC | PRN

## 2024-01-15 MED ORDER — MIDODRINE HCL 5 MG PO TABS: 5.0000 mg | ORAL_TABLET | Freq: Three times a day (TID) | ORAL | Status: DC | PRN

## 2024-01-15 MED ORDER — MIDODRINE HCL 5 MG PO TABS
10.0000 mg | ORAL_TABLET | Freq: Once | ORAL | Status: AC | PRN
  Administered 2024-01-15: 16:00:00 10 mg via ORAL

## 2024-01-15 MED ORDER — ALBUMIN HUMAN 25 % IV SOLN
INTRAVENOUS | Status: AC
  Filled 2024-01-15: qty 100

## 2024-01-15 MED ORDER — HEPARIN SODIUM (PORCINE) 1000 UNIT/ML DIALYSIS: 2000.0000 [IU] | INTRAMUSCULAR | Status: DC | PRN

## 2024-01-15 MED ORDER — HEPARIN SODIUM (PORCINE) 1000 UNIT/ML DIALYSIS: 1000.0000 [IU] | INTRAMUSCULAR | Status: DC | PRN

## 2024-01-15 MED ORDER — GLUCERNA 1.5 CAL PO LIQD
1000.0000 mL | ORAL | Status: DC
  Administered 2024-01-15: 10:00:00 1000 mL
  Filled 2024-01-15: qty 1000

## 2024-01-15 MED ORDER — HEPARIN SODIUM (PORCINE) 1000 UNIT/ML IJ SOLN
INTRAMUSCULAR | Status: AC
  Filled 2024-01-15: qty 4

## 2024-01-15 MED ORDER — NEPRO/CARBSTEADY PO LIQD: 237.0000 mL | ORAL | Status: DC | PRN

## 2024-01-15 MED ORDER — HEPARIN SODIUM (PORCINE) 1000 UNIT/ML DIALYSIS: 2500.0000 [IU] | Freq: Once | INTRAMUSCULAR | Status: DC

## 2024-01-15 MED ORDER — INSULIN ASPART 100 UNIT/ML IJ SOLN
13.0000 [IU] | Freq: Once | INTRAMUSCULAR | Status: AC
  Administered 2024-01-15: 23:00:00 13 [IU] via SUBCUTANEOUS
  Filled 2024-01-15: qty 13

## 2024-01-15 MED ORDER — MIDODRINE HCL 5 MG PO TABS
ORAL_TABLET | ORAL | Status: AC
  Filled 2024-01-15: qty 2

## 2024-01-15 MED ADMIN — Lidocaine Patch 5%: 2 | TRANSDERMAL | @ 09:00:00 | NDC 65162079104

## 2024-01-15 NOTE — Procedures (Signed)
 Objective Swallowing Evaluation: Type of Study: FEES-Fiberoptic Endoscopic Evaluation of Swallow   Patient Details  Name: Timothy Holloway MRN: [id number] Date of Birth: 03/03/1955  Today's Date: 01/15/2024 Time: SLP Start Time (ACUTE ONLY): 1100 -SLP Stop Time (ACUTE ONLY): 1120  SLP Time Calculation (min) (ACUTE ONLY): 20 min   Past Medical History:  Past Medical History:  Diagnosis Date   Anemia    Cataract    Chronic kidney disease (CKD), stage III (moderate) (HCC)    dr Faythe   Depression    Diabetes mellitus without complication (HCC)    DVT (deep venous thrombosis) (HCC)    as a child   ESRD on dialysis (HCC)    M-W-F   Hyperlipidemia    Hypertension    Impotence    Insomnia    Obesity    Steal syndrome as complication of dialysis access    Past Surgical History:  Past Surgical History:  Procedure Laterality Date   A/V FISTULAGRAM Right 10/11/2022   Procedure: A/V Fistulagram;  Surgeon: Serene Gaile ORN, MD;  Location: [facility] INVASIVE CV LAB;  Service: Cardiovascular;  Laterality: Right;   AV FISTULA PLACEMENT Left 01/11/2022   Procedure: LEFT ARM ARTERIOVENOUS (AV) FISTULA CREATION;  Surgeon: Lanis Fonda BRAVO, MD;  Location: [facility] OR;  Service: Vascular;  Laterality: Left;  PERIPHERAL NERVE BLOCK   AV FISTULA PLACEMENT Right 07/26/2022   Procedure: RIGHT UPPER EXTREMITY ARTERIOVENOUS (AV) FISTULA CREATION;  Surgeon: Oris Krystal FALCON, MD;  Location: [facility] ORS;  Service: Vascular;  Laterality: Right;   CORONARY ARTERY BYPASS GRAFT N/A 01/08/2024   Procedure: CORONARY ARTERY BYPASS GRAFTING (CABG) TIMES 3 USING LEFT INTERNAL MAMMARY ARTERY AND ENDOSCOPICALLY HARVESTED RIGHT GREATER SAPHENOUS VEIN;  Surgeon: Shyrl Linnie KIDD, MD;  Location: [facility] OR;  Service: Open Heart Surgery;  Laterality: N/A;  second case   DIALYSIS/PERMA CATHETER INSERTION Right 10/23/2023   Procedure: DIALYSIS/PERMA CATHETER INSERTION;  Surgeon: Magda Debby SAILOR, MD;  Location: [facility];  Service:  Cardiovascular;  Laterality: Right;   INSERTION OF DIALYSIS CATHETER Right 06/16/2022   Procedure: INSERTION OF RIGHT iNTERNAL Jugular TUNNELED DIALYSIS CATHETER;  Surgeon: Serene Gaile ORN, MD;  Location: [facility] OR;  Service: Vascular;  Laterality: Right;   KNEE SURGERY Left 01-31-2002   LEFT HEART CATH AND CORONARY ANGIOGRAPHY N/A 01/05/2024   Procedure: LEFT HEART CATH AND CORONARY ANGIOGRAPHY;  Surgeon: Jordan, Peter M, MD;  Location: [facility] INVASIVE CV LAB;  Service: Cardiovascular;  Laterality: N/A;   LIGATION OF ARTERIOVENOUS  FISTULA Left 06/16/2022   Procedure: LEFT ARM FISTULA LIGATION;  Surgeon: Serene Gaile ORN, MD;  Location: [facility] OR;  Service: Vascular;  Laterality: Left;   LIGATION OF ARTERIOVENOUS  FISTULA Right 11/08/2022   Procedure: LIGATION of RIGHT RADIOCEPHALIC FISTULA;  Surgeon: Sheree Penne Bruckner, MD;  Location: [facility] OR;  Service: Vascular;  Laterality: Right;   SPINE SURGERY     fusion of L4-L5   TEE WITHOUT CARDIOVERSION N/A 01/08/2024   Procedure: ECHOCARDIOGRAM, TRANSESOPHAGEAL;  Surgeon: Shyrl Linnie KIDD, MD;  Location: [facility] OR;  Service: Open Heart Surgery;  Laterality: N/A;   TRANSCAROTID ARTERY REVASCULARIZATION  Left 01/10/2024   Procedure: TRANSCAROTID ARTERY REVASCULARIZATION (TCAR);  Surgeon: Gretta Bruckner PARAS, MD;  Location: [facility] OR;  Service: Vascular;  Laterality: Left;   UPPER EXTREMITY ANGIOGRAPHY Right 11/03/2022   Procedure: Upper Extremity Angiography;  Surgeon: Gretta Bruckner PARAS, MD;  Location: [facility] INVASIVE CV LAB;  Service: Cardiovascular;  Laterality: Right;   HPI: Timothy Holloway is a 68  y.o. male status post CABG on postop day 1 in ICU was noted to be having difficulty with words and right sided weakenss, improved. CT negative. No MRI due to pacer. ESRD on hemodialysis, hypertension, hyperlipidemia, diabetes,   No data recorded   Assessment / Plan / Recommendation     01/15/2024   12:00 PM  Clinical Impressions  Clinical Impression Dysphagia  has resolved. No penetration or aspiraiton. No residue. Swallow is initiated with thin liquids arriving in pyriform sinuses minimally. Pt may initiate a regular diet and thin liquids. SLP will sign off.  SLP Visit Diagnosis --  Attention and concentration deficit following --  Frontal lobe and executive function deficit following --  Impact on safety and function --         01/15/2024   12:00 PM  Treatment Recommendations  Treatment Recommendations No treatment recommended at this time        01/11/2024    2:00 PM  Prognosis  Prognosis for improved oropharyngeal function Good  Barriers to Reach Goals --  Barriers/Prognosis Comment --    Swallow Evaluation Recommendations Recommendations: PO diet PO Diet Recommendation: Regular;Thin liquids (Level 0) Liquid Administration via: Cup;Straw Medication Administration: Whole meds with liquid Supervision: Patient able to self-feed Postural changes: Position pt fully upright for meals          01/15/2024   12:00 PM  Other Recommendations  Recommended Consults --  Oral Care Recommendations Patient independent with oral care  Caregiver Recommendations --  Follow Up Recommendations No SLP follow up  Assistance recommended at discharge --  Functional Status Assessment --       01/11/2024    2:00 PM  Frequency and Duration   Speech Therapy Frequency (ACUTE ONLY) min 2x/week  Treatment Duration 2 weeks         01/15/2024   12:00 PM  Oral Phase  Oral Phase WFL  Oral - Pudding Teaspoon --  Oral - Pudding Cup --  Oral - Honey Teaspoon --  Oral - Honey Cup --  Oral - Nectar Teaspoon --  Oral - Nectar Cup --  Oral - Nectar Straw --  Oral - Thin Teaspoon --  Oral - Thin Cup --  Oral - Thin Straw --  Oral - Puree --  Oral - Mech Soft --  Oral - Regular --  Oral - Multi-Consistency --  Oral - Pill --  Oral Phase - Comment --       01/15/2024   12:00 PM  Pharyngeal Phase  Pharyngeal Phase WFL  Pharyngeal- Pudding  Teaspoon --  Pharyngeal --  Pharyngeal- Pudding Cup --  Pharyngeal --  Pharyngeal- Honey Teaspoon --  Pharyngeal --  Pharyngeal- Honey Cup --  Pharyngeal --  Pharyngeal- Nectar Teaspoon --  Pharyngeal --  Pharyngeal- Nectar Cup --  Pharyngeal --  Pharyngeal- Nectar Straw --  Pharyngeal --  Pharyngeal- Thin Teaspoon --  Pharyngeal --  Pharyngeal- Thin Cup --  Pharyngeal --  Pharyngeal- Thin Straw --  Pharyngeal --  Pharyngeal- Puree --  Pharyngeal --  Pharyngeal- Mechanical Soft --  Pharyngeal --  Pharyngeal- Regular --  Pharyngeal --  Pharyngeal- Multi-consistency --  Pharyngeal --  Pharyngeal- Pill --  Pharyngeal --  Pharyngeal Comment --         No data to display           Aragon Scarantino, Consuelo Fitch 01/15/2024, 12:12 PM

## 2024-01-15 NOTE — Progress Notes (Signed)
 Received patient in bed to unit.  Alert and oriented.  Informed consent signed and in chart.   TX duration:  Patient tolerated well.  Transported back to the room  Alert, without acute distress.  Hand-off given to patient's nurse.   Access used: R. Dialysis Catheter; Dressing changed  Access issues: N/A  Total UF removed: -0.1  Medication(s) given: Midodrine  10mg ; Albumin  50g Post HD VS: 135/48, HR 64 Post HD weight:   01/15/24 1809  Vitals  Temp 97.6 F (36.4 C)  Temp Source Oral  BP (!) 139/43  MAP (mmHg) 67  BP Location Left Arm  BP Method Automatic  Patient Position (if appropriate) Lying  Pulse Rate 64  Pulse Rate Source Monitor  ECG Heart Rate 64  Resp 17  Oxygen Therapy  SpO2 100 %  O2 Device Room Air  Patient Activity (if Appropriate) In bed  Pulse Oximetry Type Continuous  Oximetry Probe Site Changed No  Post Treatment  Dialyzer Clearance Lightly streaked  Tolerated HD Treatment Yes  Hemodialysis Catheter Right Internal jugular Double lumen Permanent (Tunneled)  Placement Date/Time: 10/23/23 0931   Placed prior to admission: No  Serial / Lot #: [id number]  Expiration Date: 06/30/28  Time Out: Correct patient;Correct site;Correct procedure  Maximum sterile barrier precautions: Hand hygiene;Large sterile sheet;C...  Site Condition No complications  Blue Lumen Status Flushed;Heparin  locked  Red Lumen Status Flushed;Heparin  locked  Catheter fill solution Heparin  1000 units/ml  Catheter fill volume (Arterial) 1.6 cc  Catheter fill volume (Venous) 1.6  Dressing Type Transparent  Dressing Status Antimicrobial disc/dressing in place  Interventions New dressing;Dressing changed  Drainage Description None  Dressing Change Due 01/22/24  Post treatment catheter status Capped and Clamped     Carlyon Rhein, RN [facility]

## 2024-01-15 NOTE — Progress Notes (Signed)
 Nutrition Follow-up  DOCUMENTATION CODES:   Obesity unspecified  INTERVENTION:   Discontinue tube feeds and remove Cortrak Pt eating well, eating 75-100% of meals and now has diet advanced to regular Pt's blood sugar uncontrolled likely as a result of continuous feeds  Encouraged adequate intake of meals and supplements Continue Magic cup TID with meals, each supplement provides 290 kcal and 9 grams of protein Continue Mighty Shake TID  Continue Renal MVI daily  NUTRITION DIAGNOSIS:   Increased nutrient needs related to chronic illness (ESRD on HD) as evidenced by estimated needs. New diagnosis  GOAL:   Patient will meet greater than or equal to 90% of their needs  Progressing  MONITOR:   PO intake, Supplement acceptance  REASON FOR ASSESSMENT:   Consult Assessment of nutrition requirement/status  ASSESSMENT:   Pt admitted with NSTEMI with acute systolic HF requiring CABG x 3, postop shock, currently on CRRT (hx of ESRD in iHD). Pt developed new onset R sided weakness on 12/10, Code stroke called and pt found to have L ICA stenosis and required TCAR. PMH includes ESRD on iHD, DM, HTN, obesity, chronic anemia  12/05 Admitted 12/08 iHD, OR for CABG, extubated 12/10 Concerns for CVA vs TIA, OR with Vascular Surgery for TCAR, returned on vent, [name] 12/11 Extubated, FEES-diet advanced to FL, nectar thick 12/12 Cortrak,TF initiated 12/13 CRRT stopped 12/15 repeat FEES- diet advanced to Regular w/ thins  24 hr calorie count: 12/13 lunch: 50% sweet tea, magic cup, Mighty Shake (340 kcal, 9 g protein) 12/13 dinner: 100% sweet tea; 75% Mighty Shake, 60% magic cup (481 kcal, 12 g protein) 12/14 breakfast: 100% pudding, milk, sweet tea, Mighty Shake, magic cup (977 kcal, 28 g protein)  Total Intake: 1798 kcal (meets 81% calorie needs), 49 g protein (meets 39% protein needs)  Spoke with CCM and SLP about pt's care. SLP conducted FEES and pt did very well, now  diet has been advanced to regular. CCM worried about pt's glucose control, CBG high last 24 hr. Discussed since pt has been eating 75-100% of meals and has an advanced diet, comfortable that pt will continue to meet needs with PO intake and tube feeds can be discontinued.   Spoke with pt who was eager to hear about diet advancement and Cortrak removal. Discussed continuing protein supplements on trays to continue to meet calorie and protein needs with intake. Pt agreeable to interventions.   Will continue to monitor status and intake. Pt has next HD treatment today, has been tolerating treatments well.   Nutritionally Relevant Medications:  atorvastatin   80 mg Oral Daily   bisacodyl   10 mg Oral Daily   Or   bisacodyl   10 mg Rectal Daily   darbepoetin (ARANESP ) injection - DIALYSIS  100 mcg Subcutaneous Q Thu-1800   insulin  aspart  3-9 Units Subcutaneous TID PC & HS   insulin  aspart  5 Units Subcutaneous TID WC   insulin  glargine  40 Units Subcutaneous Daily   multivitamin  1 tablet Oral QHS   pantoprazole  (PROTONIX ) IV  40 mg Intravenous Q24H    albumin  human     anticoagulant sodium citrate      Labs: CBG x 24 h: 99-274 mg/dL BUN 20/ Creatinine 4.50 Magnesium  1.9 (low) Phos 4.0 (WNL) Potassium 4.6 (WNL)  Diet Order:   Diet Order             Diet regular Room service appropriate? Yes with Assist; Fluid consistency: Thin  Diet effective now  EDUCATION NEEDS:   Education needs have been addressed  Skin:  Skin Assessment: Skin Integrity Issues: Skin Integrity Issues:: Incisions Incisions: surgical (closed) to neck, chest, groin, leg  Last BM:  12/14 type 6  Height:   Ht Readings from Last 1 Encounters:  01/11/24 6' (1.829 m)    Weight:   Wt Readings from Last 1 Encounters:  01/15/24 123.5 kg    BMI:  Body mass index is 36.93 kg/m.  Estimated Nutritional Needs:   Kcal:  2200-2400 kcals  Protein:  125-145 g  Fluid:  1L plus  UOP    Timothy Glance, MS, RDN, LDN Clinical Dietitian I Please reach out via secure chat

## 2024-01-15 NOTE — Progress Notes (Signed)
 OT Cancellation Note  Patient Details Name: Timothy Holloway MRN: [id number] DOB: 1955/03/29   Cancelled Treatment:    Reason Eval/Treat Not Completed: Patient at procedure or test/ unavailable (HD)  Khiley Lieser K, OTD, OTR/L SecureChat Preferred Acute Rehab [phone]   Laneta MARLA Pereyra 01/15/2024, 2:21 PM

## 2024-01-15 NOTE — Progress Notes (Signed)
 [facility] Progress Note   Subjective:    I/O yest + 700 cc Seen in room, only c/o is NG tube  Objective Vitals:   01/15/24 0400 01/15/24 0500 01/15/24 0600 01/15/24 0755  BP: (!) 137/45 (!) 116/47 (!) 118/49   Pulse: 62 64 63   Resp: 15 16 15    Temp:    98.8 F (37.1 C)  TempSrc:    Oral  SpO2: 98% 100% 100%   Weight:  123.5 kg    Height:       Physical Exam General: alert, nad, on RA, ng tube in  Heart: RRR Lungs: CTA bilat  Abdomen: soft, NTND Extremities: trace LE edema Dialysis Access: [facility]  Neuro: alert, nonfocal, ox 3    Dialysis Orders: [facility] MWF 4h B350  133.5kg  TDC  Heparin  2000+ 1400u/hr Calcitriol 1.25 three times per week Mircera 90 mcg q 2 wks (last 12/1)    CXR 12/11-> resolved vasc congestion, improved IS edema   Assessment/Plan: ESRD - usual HD MWF. CRRT used from 12/10 - 12/13 for BP stability in the face of #3. Stable off of CRRT. Plan regular HD today, watch BP's closely, might be a bit dry.  NSTEMI - 3V CAD by LHC -> s/p CABG 12/08 Stroke-like symptoms: was waxing/ waning per neuro. There was no acute CVA by imaging, but due to advanced carotid disease by CTA pt underwent L carotid artery stent per [facility] 12/10.  BP: hypotension resolved, SBP's 110-130s now  Volume - vasc congestion resolved w/ last CXR. Now on RA. Several kg under dry wt it wts are accurate.  Anemia - Hgb 7- 9 recently. Getting ESA here w/ darbe 100 mcg weekly. No iron with high ferritin.  Transfuse prn.  Nutrition - Renal diet with fluid restriction    Timothy Fret  MD  [facility] 01/15/2024, 8:42 AM  Recent Labs  Lab 01/14/24 0431 01/15/24 0457  HGB 7.6* 7.5*  ALBUMIN  2.0* 1.9*  CALCIUM  8.3* 8.1*  PHOS 3.6  3.6 4.2  4.0  CREATININE 4.20* 5.49*  K 4.4 4.6    Inpatient medications:  aspirin  EC  81 mg Oral Daily   atorvastatin   80 mg Oral Daily   bisacodyl   10 mg Oral Daily   Or   bisacodyl   10 mg Rectal Daily   Chlorhexidine  Gluconate Cloth  6  each Topical Daily   Chlorhexidine  Gluconate Cloth  6 each Topical Q0600   clopidogrel   75 mg Oral Daily   darbepoetin (ARANESP ) injection - DIALYSIS  100 mcg Subcutaneous Q Thu-1800   feeding supplement (PROSource TF20)  60 mL Per Tube BID   Gerhardt's butt cream   Topical Daily   heparin  injection (subcutaneous)  5,000 Units Subcutaneous Q8H   insulin  aspart  3-9 Units Subcutaneous TID PC & HS   insulin  aspart  5 Units Subcutaneous TID WC   insulin  glargine  40 Units Subcutaneous Daily   lidocaine   2 patch Transdermal Q24H   multivitamin  1 tablet Oral QHS   mouth rinse  15 mL Mouth Rinse 4 times per day   pantoprazole  (PROTONIX ) IV  40 mg Intravenous Q24H   sodium chloride  flush  10-40 mL Intracatheter Q12H   sodium chloride  flush  3 mL Intravenous Q12H    albumin  human     feeding supplement (VITAL 1.5 CAL) 30 mL/hr at 01/15/24 0600   albumin  human, albuterol , heparin , midodrine , ondansetron  (ZOFRAN ) IV, mouth rinse, oxyCODONE , sodium chloride  flush, sodium chloride  flush, traMADol ,  zolpidem 

## 2024-01-15 NOTE — Progress Notes (Signed)
° °   °  [address] [place] [employer] [address]             [phone]                5 Days Post-Op Procedures (LRB): TRANSCAROTID ARTERY REVASCULARIZATION (TCAR) (Left)   Events: No events _______________________________________________________________ Vitals: BP (!) 118/49   Pulse 63   Temp 98.8 F (37.1 C)   Resp 15   Ht 6' (1.829 m)   Wt 123.5 kg   SpO2 100%   BMI 36.93 kg/m  Filed Weights   01/13/24 0500 01/14/24 0500 01/15/24 0500  Weight: 130.6 kg 128.2 kg 123.5 kg     - Neuro: alert NAD.    - Cardiovascular: sinus   Drips: none    - Pulm: EWOB  ABG    Component Value Date/Time   PHART 7.430 01/12/2024 1028   PCO2ART 34.4 01/12/2024 1028   PO2ART 71 (L) 01/12/2024 1028   HCO3 22.9 01/12/2024 1028   TCO2 24 01/12/2024 1028   ACIDBASEDEF 1.0 01/12/2024 1028   O2SAT 95 01/12/2024 1028    - Abd: ND - Extremity: trace edema  .Intake/Output      12/14 0701 12/15 0700 12/15 0701 12/16 0700   P.O. 100    I.V. (mL/kg) 157 (1.3)    NG/GT 899.2    Total Intake(mL/kg) 1156.2 (9.4)    Urine (mL/kg/hr) 440 (0.1)    Stool     CRRT     Total Output 440    Net +716.2            _______________________________________________________________ Labs:    Latest Ref Rng & Units 01/15/2024    4:57 AM 01/14/2024    4:31 AM 01/13/2024    7:29 AM  CBC  WBC 4.0 - 10.5 K/uL 15.9  14.5    Hemoglobin 13.0 - 17.0 g/dL 7.5  7.6  8.0   Hematocrit 39.0 - 52.0 % 24.2  24.1  24.8   Platelets 150 - 400 K/uL 294  255        Latest Ref Rng & Units 01/15/2024    4:57 AM 01/14/2024    4:31 AM 01/13/2024    3:30 AM  CMP  Glucose 70 - 99 mg/dL 92  802  796   BUN 8 - 23 mg/dL 79  52  26   Creatinine 0.61 - 1.24 mg/dL 4.50  5.79  7.19   Sodium 135 - 145 mmol/L 136  138  137   Potassium 3.5 - 5.1 mmol/L 4.6  4.4  4.3   Chloride 98 - 111 mmol/L 105  98  102   CO2 22 - 32 mmol/L 21  26  26    Calcium  8.9 - 10.3 mg/dL 8.1  8.3  8.0      CXR: stable  _______________________________________________________________  Assessment and Plan: POD 7 s/p CABG  POD 5 s/p TCAR  Neuro: pain controlled.   CV: on A/S/BB, plavix  Pulm: IS, ambulation Renal: ESRD. GI: on diet Heme: stable.  On plavix  ID: afebrile Endo: SSI Dispo: continue ICU care   Timothy Holloway 01/15/2024 10:32 AM

## 2024-01-15 NOTE — Progress Notes (Signed)
 NAME:  Timothy Holloway, MRN:  [id number], DOB:  [birth year], LOS: 10 ADMISSION DATE:  01/05/2024, CONSULTATION DATE:  01/08/24 REFERRING MD:  Raenelle POUR., CHIEF COMPLAINT:  s/p CABG   History of Present Illness:  Timothy Holloway is a 68 year old male with past medical history significant for ESRD on HD, T2DM, HTN, obesity, chronic anemia who presented to [facility] ED 01/05/24 after experiencing chest pain/pressure during dialysis. In the ED, troponins were elevated, he was started on a heparin  gtt and transferred to [facility] for a cardiac cath where he was found to have triple-vessel disease and CHF. TCTS to take patient for CABG 12/8. PCCM consulted for post-operative medical management.  OR course EBL: 1100 Cell saver 482 Products 0 Bypass time: 1hr 58 min  Clamp time 1hr 21 min   Pertinent Medical History:   Past Medical History:  Diagnosis Date   Anemia    Cataract    Chronic kidney disease (CKD), stage III (moderate) (HCC)    dr Faythe   Depression    Diabetes mellitus without complication (HCC)    DVT (deep venous thrombosis) (HCC)    as a child   ESRD on dialysis [facility])    M-W-F   Hyperlipidemia    Hypertension    Impotence    Insomnia    Obesity    Steal syndrome as complication of dialysis access    Significant Hospital Events: Including procedures, antibiotic start and stop dates in addition to other pertinent events   12/5: Transfer from [facility]->cath showed triple-vessel disease/CHF 12/8: iHD in am Pre-op   CABG w/ Dr. Shyrl, successfully extubated.  12/9 no distress. Weaning NE 12/10 Concerns for CVA vs TIA yesterday, however when MAPs improved 12/11 Code stroke called yesterday, right hemiplegia, right eye visual changes- neuro s/s not responding to MAP, CT perfusion scan completed- [name], came back intubated after procedure, [name]   Interim History / Subjective:  No events Seems to be in better spirits  Objective    Blood pressure (!)  118/49, pulse 63, temperature 98.8 F (37.1 C), temperature source Oral, resp. rate 15, height 6' (1.829 m), weight 123.5 kg, SpO2 100%.        Intake/Output Summary (Last 24 hours) at 01/15/2024 0832 Last data filed at 01/15/2024 0600 Gross per 24 hour  Intake 1098.15 ml  Output 440 ml  Net 658.15 ml   Filed Weights   01/13/24 0500 01/14/24 0500 01/15/24 0500  Weight: 130.6 kg 128.2 kg 123.5 kg   Examination: No distress Moving everything to command, no obvious weakness on R for me Pulls 1k on IS RASS 0 Cortrak in place Trace edema  Patient Lines/Drains/Airways Status     Active Line/Drains/Airways     Name Placement date Placement time Site Days   Arterial Line 01/10/24 Right Radial 01/10/24  0347  Radial  5   Peripheral IV 01/08/24 16 G Anterior;Proximal;Right Forearm 01/08/24  1230  Forearm  7   Peripheral IV 01/14/24 20 G Anterior;Proximal;Right Forearm 01/14/24  1203  Forearm  1   Fistula / Graft Left Upper arm Arteriovenous fistula 01/11/22  1029  Upper arm  734   Fistula / Graft Right Forearm Arteriovenous fistula 07/26/22  1224  Forearm  538   Hemodialysis Catheter Right Internal jugular Double lumen Permanent (Tunneled) 10/23/23  0931  Internal jugular  84   Urethral Catheter Chrisa McClellan Non-latex 16 Fr. 01/10/24  1544  Non-latex  5   Small Bore Feeding  Tube Left nare Marking at nare/corner of mouth 70 cm 01/12/24  1045  Left nare  3   Wound 01/08/24 1539 Surgical Closed Surgical Incision Leg Right 01/08/24  1539  Leg  7   Wound 01/08/24 1736 Surgical Closed Surgical Incision Chest 01/08/24  1736  Chest  7   Wound 01/10/24 1456 Surgical Closed Surgical Incision Groin 01/10/24  1456  Groin  5   Wound 01/10/24 1456 Surgical Closed Surgical Incision Neck Left 01/10/24  1456  Neck  5   Wound 01/13/24 0800 Perineum 01/13/24  0800  Perineum  2   Wound 01/14/24 0754 Pressure Injury Coccyx Medial Stage 2 -  Partial thickness loss of dermis presenting as a shallow open  injury with a red, pink wound bed without slough. 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 Uncontrolled hyperglycemia on TF after coming off CRRT Deconditioning Anemia- stable Relative protein calorie malnutrition  - Glargine + SSI + mealtime; going to try to wean off TF - CIR consult - GDMT per TCTS - Remove foley - Okay to go to HD today as progressive status, midodrine  and albumin  ordered PRN - Push IS, OOB - Trend WBC - Dispo per TCTS, will follow while in ICU  Rolan Sharps MD PCCM

## 2024-01-16 ENCOUNTER — Encounter (HOSPITAL_COMMUNITY): Payer: Self-pay

## 2024-01-16 ENCOUNTER — Telehealth (HOSPITAL_COMMUNITY): Payer: Self-pay

## 2024-01-16 ENCOUNTER — Other Ambulatory Visit (HOSPITAL_COMMUNITY): Payer: Self-pay

## 2024-01-16 LAB — GLUCOSE, CAPILLARY
Glucose-Capillary: 131 mg/dL — ABNORMAL HIGH (ref 70–99)
Glucose-Capillary: 148 mg/dL — ABNORMAL HIGH (ref 70–99)
Glucose-Capillary: 259 mg/dL — ABNORMAL HIGH (ref 70–99)
Glucose-Capillary: 120 mg/dL — ABNORMAL HIGH (ref 70–99)
Glucose-Capillary: 210 mg/dL — ABNORMAL HIGH (ref 70–99)
Glucose-Capillary: 229 mg/dL — ABNORMAL HIGH (ref 70–99)
Glucose-Capillary: 203 mg/dL — ABNORMAL HIGH (ref 70–99)
Glucose-Capillary: 166 mg/dL — ABNORMAL HIGH (ref 70–99)

## 2024-01-16 LAB — RENAL FUNCTION PANEL
Albumin: 2.5 g/dL — ABNORMAL LOW (ref 3.5–5.0)
Anion gap: 10 (ref 5–15)
BUN: 50 mg/dL — ABNORMAL HIGH (ref 8–23)
CO2: 26 mmol/L (ref 22–32)
Calcium: 7.9 mg/dL — ABNORMAL LOW (ref 8.9–10.3)
Chloride: 97 mmol/L — ABNORMAL LOW (ref 98–111)
Creatinine, Ser: 4.2 mg/dL — ABNORMAL HIGH (ref 0.61–1.24)
GFR, Estimated: 14 mL/min — ABNORMAL LOW (ref >60)
Glucose, Bld: 151 mg/dL — ABNORMAL HIGH (ref 70–99)
Phosphorus: 3.3 mg/dL (ref 2.5–4.6)
Potassium: 4 mmol/L (ref 3.5–5.1)
Sodium: 133 mmol/L — ABNORMAL LOW (ref 135–145)

## 2024-01-16 LAB — MAGNESIUM: Magnesium: 2.1 mg/dL (ref 1.7–2.4)

## 2024-01-16 MED ORDER — HEPARIN SODIUM (PORCINE) 1000 UNIT/ML DIALYSIS
1500.0000 [IU] | INTRAMUSCULAR | Status: AC | PRN
  Administered 2024-01-17: 10:00:00 1500 [IU] via INTRAVENOUS_CENTRAL

## 2024-01-16 MED ORDER — ORAL CARE MOUTH RINSE: 15.0000 mL | OROMUCOSAL | Status: DC | PRN

## 2024-01-16 MED ORDER — INSULIN ASPART 100 UNIT/ML IJ SOLN
0.0000 [IU] | Freq: Three times a day (TID) | INTRAMUSCULAR | Status: DC
  Administered 2024-01-16 (×2): 8 [IU] via SUBCUTANEOUS
  Administered 2024-01-16: 08:00:00 12 [IU] via SUBCUTANEOUS
  Administered 2024-01-16: 12:00:00 2 [IU] via SUBCUTANEOUS
  Administered 2024-01-17 (×2): 4 [IU] via SUBCUTANEOUS
  Administered 2024-01-17 (×2): 2 [IU] via SUBCUTANEOUS
  Administered 2024-01-18: 13:00:00 4 [IU] via SUBCUTANEOUS
  Administered 2024-01-18: 07:00:00 2 [IU] via SUBCUTANEOUS
  Filled 2024-01-16 (×2): qty 4
  Filled 2024-01-16 (×2): qty 8
  Filled 2024-01-16: qty 2
  Filled 2024-01-16: qty 12
  Filled 2024-01-16 (×2): qty 2
  Filled 2024-01-16: qty 4

## 2024-01-16 MED ORDER — SODIUM CHLORIDE 0.9% FLUSH
3.0000 mL | INTRAVENOUS | Status: DC | PRN
  Administered 2024-01-17: 21:00:00 3 mL via INTRAVENOUS

## 2024-01-16 MED ORDER — HEPARIN SODIUM (PORCINE) 1000 UNIT/ML DIALYSIS
1000.0000 [IU] | INTRAMUSCULAR | Status: DC | PRN
  Administered 2024-01-17: 10:00:00 1000 [IU]

## 2024-01-16 MED ORDER — LIDOCAINE-PRILOCAINE 2.5-2.5 % EX CREA: 1.0000 | TOPICAL_CREAM | CUTANEOUS | Status: DC | PRN

## 2024-01-16 MED ORDER — LIDOCAINE HCL (PF) 1 % IJ SOLN: 5.0000 mL | INTRAMUSCULAR | Status: DC | PRN

## 2024-01-16 MED ORDER — HEPARIN SODIUM (PORCINE) 1000 UNIT/ML DIALYSIS
2500.0000 [IU] | Freq: Once | INTRAMUSCULAR | Status: AC
  Administered 2024-01-17: 08:00:00 2500 [IU] via INTRAVENOUS_CENTRAL

## 2024-01-16 MED ORDER — PENTAFLUOROPROP-TETRAFLUOROETH EX AERO: 1.0000 | INHALATION_SPRAY | CUTANEOUS | Status: DC | PRN

## 2024-01-16 MED ORDER — ANTICOAGULANT SODIUM CITRATE 4% (200MG/5ML) IV SOLN: 5.0000 mL | Status: DC | PRN

## 2024-01-16 MED ORDER — ~~LOC~~ CARDIAC SURGERY, PATIENT & FAMILY EDUCATION: Freq: Once | Status: AC

## 2024-01-16 MED ORDER — NEPRO/CARBSTEADY PO LIQD: 237.0000 mL | ORAL | Status: DC | PRN

## 2024-01-16 MED ORDER — CHLORHEXIDINE GLUCONATE CLOTH 2 % EX PADS
6.0000 | MEDICATED_PAD | Freq: Every day | CUTANEOUS | Status: DC
  Administered 2024-01-17 – 2024-01-18 (×2): 6 via TOPICAL

## 2024-01-16 MED ORDER — SODIUM CHLORIDE 0.9 % IV SOLN: 250.0000 mL | INTRAVENOUS | Status: AC | PRN

## 2024-01-16 MED ORDER — ALTEPLASE 2 MG IJ SOLR: 2.0000 mg | Freq: Once | INTRAMUSCULAR | Status: DC | PRN

## 2024-01-16 MED ORDER — SODIUM CHLORIDE 0.9% FLUSH
3.0000 mL | Freq: Two times a day (BID) | INTRAVENOUS | Status: DC
  Administered 2024-01-16 – 2024-01-18 (×4): 3 mL via INTRAVENOUS

## 2024-01-16 MED ORDER — FENTANYL CITRATE (PF) 50 MCG/ML IJ SOSY: 25.0000 ug | PREFILLED_SYRINGE | Freq: Once | INTRAMUSCULAR | Status: DC

## 2024-01-16 MED ORDER — INSULIN GLARGINE 100 UNIT/ML ~~LOC~~ SOLN
25.0000 [IU] | Freq: Two times a day (BID) | SUBCUTANEOUS | Status: DC
  Administered 2024-01-16 – 2024-01-18 (×4): 25 [IU] via SUBCUTANEOUS
  Filled 2024-01-16 (×6): qty 0.25

## 2024-01-16 MED ADMIN — Lidocaine Patch 5%: 2 | TRANSDERMAL | @ 09:00:00 | NDC 00591352511

## 2024-01-16 NOTE — Inpatient Diabetes Management (Signed)
 Inpatient Diabetes Program Recommendations  AACE/ADA: New Consensus Statement on Inpatient Glycemic Control   Target Ranges:  Prepandial:   less than 140 mg/dL      Peak postprandial:   less than 180 mg/dL (1-2 hours)      Critically ill patients:  140 - 180 mg/dL   Lab Results  Component Value Date   GLUCAP 120 (H) 01/16/2024   HGBA1C 9.9 (H) 01/05/2024    Latest Reference Range & Units 01/15/24 22:02 01/15/24 23:19 01/16/24 03:20 01/16/24 06:34 01/16/24 07:51 01/16/24 11:34  Glucose-Capillary 70 - 99 mg/dL 729 (H) 748 (H) 868 (H) 148 (H) 259 (H) 120 (H)   Review of Glycemic Control  Diabetes history: DM 2  Outpatient Diabetes medications:  Lantus  60 units q HS Humalog 10 units tid with meals   Current orders for Inpatient glycemic control:  Lantus  25 units BID  Novolog  0-24 units TID/HS  Novolog  5 units TID with meals   Inpatient Diabetes Program Recommendations:   Patient sleeping upon arrival to room. Spoke with patient at bedside; however, very drowsy and falling asleep during conversation. Briefly discussed A1C results of 9.9% and explained that his current A1C indicates an average glucose of 237 mg/dl over the past 2-3 months.  Discussed glucose and A1C goals. Patient did confirm he was taking Lantus  60 units daily and Humalog 10 units TID with meals prior to admission. Discussed importance of maintaining good CBG control to prevent long-term and short-term complications.   Patient would like to start wearing a CGM to monitor his blood glucose. I confirmed with pharmacy, copay for FreeStyle Continuous Glucose Monitor is $0.  Patient does not have a smart phone and will require a Reader to initiate CGM.   Patient verbalized understanding of information discussed and reports no further questions at this time related to diabetes.  Please provide patient with following Rx at discharge:  [facility] 3 Reader [phone])  - FreeStyle Libre 3 Sensors [phone])   Thanks,   Lavanda Search, RN, MSN, [facility]  Inpatient Diabetes Coordinator  Pager [phone] (8a-5p)

## 2024-01-16 NOTE — Progress Notes (Signed)
° °  [address] [place] [address]             [phone]   POD # 8/ 6  Sitting up in bed eating dinner  BP (!) 104/37   Pulse 64   Temp 99.6 F (37.6 C) (Oral)   Resp 20   Ht 6' (1.829 m)   Wt 132.4 kg   SpO2 99%   BMI 39.59 kg/m    Intake/Output Summary (Last 24 hours) at 01/16/2024 1744 Last data filed at 01/16/2024 0700 Gross per 24 hour  Intake 470 ml  Output --  Net 470 ml   CBG remain elevated  Elspeth C. Kerrin, MD [facility] [phone]

## 2024-01-16 NOTE — Telephone Encounter (Signed)
 Pharmacy Patient Advocate Encounter  Insurance verification completed.    The patient is insured through [employer]/ [employer]. Patient has Medicare and is not eligible for a copay card, but may be able to apply for patient assistance or Medicare RX Payment Plan (Patient Must reach out to their plan, if eligible for payment plan), if available.    Ran test claim for [employer] 3 plus sensor and the current 30 day co-pay is $0.   This test claim was processed through [employer]- copay amounts may vary at other pharmacies due to [employer], or as the patient moves through the different stages of their insurance plan.

## 2024-01-16 NOTE — Progress Notes (Signed)
 Physical Therapy Treatment Patient Details Name: Timothy Holloway MRN: [id number] DOB: [birth year] Today's Date: 01/16/2024   History of Present Illness 68 yo M adm 12/5 to [facility] due to CP with troponin level 1121 and glucose 306. Workup for NSTEMI, transferred to [facility] 12/8 s/p CABGx3 12/9 facial droop R arm and leg weakness with hypotension with return to baseline incerase BP imaging negative for acute infarct,known L ICA stenosis. CRRT 12/10-12/13. Code stroke re-activated 12/10 for increased R sided weakness. 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 S/p emergent L TCAR 12/10. PMH ESRD on HD (MWF) DM2, obese, HTN HLD, depression, anemia insomnia CKD steal syndrome, R and L fistula ,back surg    PT Comments  Patient progressing to ambulation in room with Elyn walker.  Fatigued already per RN after cleaning up and standing briefly at the bedside with RN.  Patient reports L knee issues prior to this episode though no buckling noted today.  Feel he will benefit from post-acute inpatient rehab (>3 hours/day) prior to d/c home.    If plan is discharge home, recommend the following: Two people to help with walking and/or transfers;Two people to help with bathing/dressing/bathroom;Assistance with cooking/housework;Assistance with feeding;Direct supervision/assist for medications management;Direct supervision/assist for financial management;Assist for transportation;Help with stairs or ramp for entrance   Can travel by private vehicle        Equipment Recommendations       Recommendations for Other Services       Precautions / Restrictions Precautions Precautions: Fall;Sternal Recall of Precautions/Restrictions: Intact Precaution/Restrictions Comments: A-line, SBP > 100 goal, syncope with nursing 12/13, x2 abdominal binders, TED hose Restrictions Other Position/Activity  Restrictions: Sternal precautions     Mobility  Bed Mobility Overal bed mobility: Needs Assistance Bed Mobility: Rolling, Sidelying to Sit Rolling: Min assist Sidelying to sit: Mod assist, +2 for safety/equipment       General bed mobility comments: cues for technique, assist for legs off EOB and for trunk upright    Transfers Overall transfer level: Needs assistance Equipment used: None Transfers: Sit to/from Stand Sit to Stand: Min assist, +2 safety/equipment, From elevated surface, Mod assist           General transfer comment: cues for momentum and assist for balance, hugging pillow to stand    Ambulation/Gait Ambulation/Gait assistance: Mod assist, +2 safety/equipment Gait Distance (Feet): 22 Feet Assistive device: Elyn Finder Gait Pattern/deviations: Step-to pattern, Step-through pattern, Decreased stride length, Wide base of support, Antalgic       General Gait Details: increased time and A For walker maneuvering, difficulty and reports L knee with arthritis previously, no episodes of buckling   Stairs             Wheelchair Mobility     Tilt Bed    Modified Rankin (Stroke Patients Only)       Balance Overall balance assessment: Needs assistance   Sitting balance-Leahy Scale: Fair     Standing balance support: Bilateral upper extremity supported Standing balance-Leahy Scale: Poor Standing balance comment: UE support needed in standing                            Communication Communication Communication: No apparent difficulties  Cognition Arousal: Alert Behavior During Therapy: WFL for tasks assessed/performed, Flat affect   PT - Cognitive impairments: Attention, Problem solving  PT - Cognition Comments: cues for mobility and increased time needed Following commands: Intact      Cueing Cueing Techniques: Verbal cues, Tactile cues  Exercises      General Comments General comments (skin  integrity, edema, etc.): VSS in supine was 104/37; sitting 119/64, after back supine 131/58; HR 70, SpO2 91% on RA      Pertinent Vitals/Pain Pain Assessment Pain Assessment: Faces Faces Pain Scale: Hurts little more Pain Location: chest with movement Pain Descriptors / Indicators: Operative site guarding Pain Intervention(s): Monitored during session, Repositioned, Other (comment) (encouraged pillow splinting)    Home Living                          Prior Function            PT Goals (current goals can now be found in the care plan section) Progress towards PT goals: Progressing toward goals    Frequency    Min 3X/week      PT Plan      Co-evaluation   Reason for Co-Treatment: Other (comment) (+3, activity tolerance)   OT goals addressed during session: ADL's and self-care;Strengthening/ROM      AM-PAC PT 6 Clicks Mobility   Outcome Measure  Help needed turning from your back to your side while in a flat bed without using bedrails?: A Lot Help needed moving from lying on your back to sitting on the side of a flat bed without using bedrails?: A Lot Help needed moving to and from a bed to a chair (including a wheelchair)?: A Lot Help needed standing up from a chair using your arms (e.g., wheelchair or bedside chair)?: A Lot Help needed to walk in hospital room?: A Lot Help needed climbing 3-5 steps with a railing? : Total 6 Click Score: 11    End of Session Equipment Utilized During Treatment: Gait belt Activity Tolerance: Patient limited by fatigue Patient left: in bed;with call bell/phone within reach   PT Visit Diagnosis: Unsteadiness on feet (R26.81);Other abnormalities of gait and mobility (R26.89);Muscle weakness (generalized) (M62.81);Difficulty in walking, not elsewhere classified (R26.2);Pain     Time: 1300-1320 PT Time Calculation (min) (ACUTE ONLY): 20 min  Charges:    $Gait Training: 8-22 mins PT General Charges $$ ACUTE PT  VISIT: 1 Visit                     Micheline Portal, PT Acute Rehabilitation Services [phone] 01/16/2024    Montie Portal 01/16/2024, 1:59 PM

## 2024-01-16 NOTE — Progress Notes (Signed)
°  Timothy Holloway Progress Note   Subjective:    I/O yest +750 cc Tolerated HD, no UF 100% sats on RA  Objective Vitals:   01/16/24 1000 01/16/24 1048 01/16/24 1100 01/16/24 1139  BP:  (!) 87/75    Pulse: 62  (!) 59   Resp: 19  11   Temp:    99.6 F (37.6 C)  TempSrc:    Oral  SpO2: 100%  99%   Weight:      Height:       Physical Exam General: alert, nad, on RA Walking in the room w/ PT Heart: RRR Lungs: CTA bilat  Abdomen: soft, NTND Extremities: trace LE edema Dialysis Access: [facility]  Neuro: alert, nonfocal, ox 3    Dialysis Orders: [facility] MWF 4h B350  133.5kg  TDC  Heparin  2000+ 1400u/hr Calcitriol 1.25 three times per week Mircera 90 mcg q 2 wks (last 12/1)    CXR 12/11-> resolved vasc congestion, improved IS edema   Assessment/Plan: ESRD - usual HD MWF. CRRT used from 12/10 - 12/13 for BP stability in the face of stroke-like symptoms. Stable off of CRRT. Tolerated regular HD Monday. Next HD tomorrow.  NSTEMI - 3V CAD by LHC -> s/p CABG 12/08 Stroke-like symptoms: was waxing/ waning per neuro. There was no acute CVA by imaging, but due to advanced carotid disease by CTA pt underwent L carotid artery stent per [facility] 12/10.  BP: labile, some low and most 110- 130 Volume - vasc congestion resolved w/ last CXR. Now on RA. Standing wt today was 134.2kg.  Anemia - Hgb 7- 9 recently. Getting ESA here w/ darbe 100 mcg weekly. No iron with high ferritin.  Transfuse prn.  Nutrition - Renal diet with fluid restriction    Myer Fret  MD  [facility] 01/16/2024, 1:41 PM  Recent Labs  Lab 01/14/24 0431 01/15/24 0457 01/16/24 0152  HGB 7.6* 7.5*  --   ALBUMIN  2.0* 1.9* 2.5*  CALCIUM  8.3* 8.1* 7.9*  PHOS 3.6  3.6 4.2  4.0 3.3  CREATININE 4.20* 5.49* 4.20*  K 4.4 4.6 4.0    Inpatient medications:  aspirin  EC  81 mg Oral Daily   atorvastatin   80 mg Oral Daily   bisacodyl   10 mg Oral Daily   Or   bisacodyl   10 mg Rectal Daily   Chlorhexidine  Gluconate  Cloth  6 each Topical Q0600   clopidogrel   75 mg Oral Daily   darbepoetin (ARANESP ) injection - DIALYSIS  100 mcg Subcutaneous Q Thu-1800   Gerhardt's butt cream   Topical Daily   heparin  injection (subcutaneous)  5,000 Units Subcutaneous Q8H   insulin  aspart  0-24 Units Subcutaneous TID AC & HS   insulin  aspart  5 Units Subcutaneous TID WC   insulin  glargine  25 Units Subcutaneous BID   lidocaine   2 patch Transdermal Q24H   multivitamin  1 tablet Oral QHS   pantoprazole  (PROTONIX ) IV  40 mg Intravenous Q24H   sodium chloride  flush  10-40 mL Intracatheter Q12H   sodium chloride  flush  3 mL Intravenous Q12H    sodium chloride      sodium chloride , albuterol , midodrine , ondansetron  (ZOFRAN ) IV, mouth rinse, oxyCODONE , sodium chloride  flush, sodium chloride  flush, traMADol , zolpidem

## 2024-01-16 NOTE — Progress Notes (Addendum)
 Inpatient Rehab Coordinator Note:  I met with patient at bedside to discuss CIR recommendations and goals/expectations of CIR stay.  We reviewed 3 hrs/day of therapy, physician follow up, and average length of stay 2 weeks (dependent upon progress) with goals of supervision.  Pt reports spouse is home and does not work so can provide expected level of assist.  I reviewed need for insurance prior auth and confirmed [employer] as payor.  Will work to contact spouse today to review goals/expectations and I will send to [employer] for prior auth request.    1305: Attempted to reach spouse by phone (home and mobile).  Home phone rings but no VM, and mobile does not have a voicemail set up.  Will continue to try and confirm caregiver support.  Reche Lowers, PT, DPT Admissions Coordinator [phone] 01/16/2024 12:30 PM

## 2024-01-16 NOTE — Progress Notes (Signed)
° °   °  [address] [place] [address]             [phone]                6 Days Post-Op Procedures (LRB): TRANSCAROTID ARTERY REVASCULARIZATION (TCAR) (Left)   Events: No events _______________________________________________________________ Vitals: BP (!) 102/41   Pulse 65   Temp 99.2 F (37.3 C) (Oral)   Resp 17   Ht 6' (1.829 m)   Wt 134.7 kg   SpO2 99%   BMI 40.27 kg/m  Filed Weights   01/15/24 0500 01/15/24 1412 01/16/24 0500  Weight: 123.5 kg 123.5 kg 134.7 kg     - Neuro: alert NAD.    - Cardiovascular: sinus   Drips: none    - Pulm: EWOB  ABG    Component Value Date/Time   PHART 7.430 01/12/2024 1028   PCO2ART 34.4 01/12/2024 1028   PO2ART 71 (L) 01/12/2024 1028   HCO3 22.9 01/12/2024 1028   TCO2 24 01/12/2024 1028   ACIDBASEDEF 1.0 01/12/2024 1028   O2SAT 95 01/12/2024 1028    - Abd: ND - Extremity: trace edema  .Intake/Output      12/15 0701 12/16 0700 12/16 0701 12/17 0700   P.O. 100    I.V. (mL/kg) 10 (0.1)    NG/GT 260.3    Total Intake(mL/kg) 370.3 (2.7)    Urine (mL/kg/hr) 250 (0.1)    Total Output 250    Net +120.3            _______________________________________________________________ Labs:    Latest Ref Rng & Units 01/15/2024    4:57 AM 01/14/2024    4:31 AM 01/13/2024    7:29 AM  CBC  WBC 4.0 - 10.5 K/uL 15.9  14.5    Hemoglobin 13.0 - 17.0 g/dL 7.5  7.6  8.0   Hematocrit 39.0 - 52.0 % 24.2  24.1  24.8   Platelets 150 - 400 K/uL 294  255        Latest Ref Rng & Units 01/16/2024    1:52 AM 01/15/2024    4:57 AM 01/14/2024    4:31 AM  CMP  Glucose 70 - 99 mg/dL 848  92  802   BUN 8 - 23 mg/dL 50  79  52   Creatinine 0.61 - 1.24 mg/dL 5.79  4.50  5.79   Sodium 135 - 145 mmol/L 133  136  138   Potassium 3.5 - 5.1 mmol/L 4.0  4.6  4.4   Chloride 98 - 111 mmol/L 97  105  98   CO2 22 - 32 mmol/L 26  21  26    Calcium  8.9 - 10.3 mg/dL 7.9  8.1  8.3      CXR: -  _______________________________________________________________  Assessment and Plan: POD 8 s/p CABG  POD 6 s/p TCAR  Neuro: pain controlled.   CV: on A/S/BB, plavix  Pulm: IS, ambulation Renal: ESRD.  On IHD GI: on diet Heme: stable.  On plavix  ID: afebrile Endo: SSI Dispo: floor   Paighton Godette O Makinsley Schiavi 01/16/2024 8:00 AM

## 2024-01-16 NOTE — Progress Notes (Addendum)
 01/16/2024 Did well with HD yesterday. Switch lantus  from 40 daily to 25 bid to help with AM sugars To floor. Will be available as needed.  Rolan Sharps MD PCCM

## 2024-01-16 NOTE — Progress Notes (Signed)
 Occupational Therapy Treatment Patient Details Name: Timothy Holloway MRN: [id number] DOB: 10-17-1955 Today's Date: 01/16/2024   History of present illness 68 yo M adm 12/5 to [facility] due to CP with troponin level 1121 and glucose 306. Workup for NSTEMI, transferred to [facility] 12/8 s/p CABGx3 12/9 facial droop R arm and leg weakness with hypotension with return to baseline incerase BP imaging negative for acute infarct,known L ICA stenosis. CRRT 12/10-12/13. Code stroke re-activated 12/10 for increased R sided weakness. 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 S/p emergent L TCAR 12/10. PMH ESRD on HD (MWF) DM2, obese, HTN HLD, depression, anemia insomnia CKD steal syndrome, R and L fistula ,back surg   OT comments  Pt progressing toward goals this session, overall needs min-mod A for ADLs, min -mod A for bed mobility and min +2 for transfers with Elyn walker. Pt ambulating short distance in room, reports 9/10 fatigue. Pt with BP stable throughout. Able to perform semi- reclined BUE therex with in limits of sternal precautions, also with difficulty reaching feet for LB ADLs, would benefit from AE trial in future sessions. Pt presenting with impairments listed below, will follow acutely. Patient will benefit from intensive inpatient follow-up therapy, >3 hours/day.       If plan is discharge home, recommend the following:  Two people to help with walking and/or transfers;A lot of help with bathing/dressing/bathroom;Assistance with cooking/housework;Help with stairs or ramp for entrance   Equipment Recommendations  Other (comment) (defer)    Recommendations for Other Services Rehab consult    Precautions / Restrictions Precautions Precautions: Fall;Sternal Precaution/Restrictions Comments: A-line, SBP > 100 goal, syncope with nursing 12/13, TED hose Restrictions Weight  Bearing Restrictions Per Provider Order: No       Mobility Bed Mobility Overal bed mobility: Needs Assistance Bed Mobility: Rolling, Sidelying to Sit, Sit to Sidelying Rolling: Min assist Sidelying to sit: Min assist, +2 for physical assistance     Sit to sidelying: Mod assist General bed mobility comments: mod A for returning BLE into bed    Transfers Overall transfer level: Needs assistance Equipment used:  (eva walker) Transfers: Sit to/from Stand Sit to Stand: Min assist, +2 physical assistance                 Balance Overall balance assessment: Needs assistance Sitting-balance support: No upper extremity supported, Feet supported Sitting balance-Leahy Scale: Good     Standing balance support: No upper extremity supported, During functional activity Standing balance-Leahy Scale: Poor                             ADL either performed or assessed with clinical judgement   ADL Overall ADL's : Needs assistance/impaired                 Upper Body Dressing : Sitting;Moderate assistance Upper Body Dressing Details (indicate cue type and reason): donning gown on backside Lower Body Dressing: Moderate assistance Lower Body Dressing Details (indicate cue type and reason): donning TEDs and socks Toilet Transfer: Minimal assistance;+2 for physical assistance;Ambulation (eva walker)           Functional mobility during ADLs: Minimal assistance;+2 for physical assistance      Extremity/Trunk Assessment Upper Extremity Assessment Upper Extremity Assessment: Generalized weakness   Lower Extremity Assessment Lower Extremity Assessment: Defer to PT evaluation  Vision       Perception Perception Perception: Not tested   Praxis Praxis Praxis: Not tested   Communication Communication Communication: No apparent difficulties   Cognition Arousal: Alert Behavior During Therapy: WFL for tasks assessed/performed, Flat affect Cognition:  No apparent impairments                               Following commands: Intact        Cueing   Cueing Techniques: Verbal cues, Tactile cues  Exercises Exercises: Other exercises Other Exercises Other Exercises: semi reclined external rotation BUE with elbows in x10    Shoulder Instructions       General Comments VSS in supine was 104/37; sitting 119/64, after back supine 131/58; HR 70, SpO2 91% on RA    Pertinent Vitals/ Pain       Pain Assessment Pain Assessment: No/denies pain  Home Living                                          Prior Functioning/Environment              Frequency  Min 2X/week        Progress Toward Goals  OT Goals(current goals can now be found in the care plan section)  Progress towards OT goals: Progressing toward goals  Acute Rehab OT Goals Patient Stated Goal: none stated OT Goal Formulation: With patient Time For Goal Achievement: 01/25/24 Potential to Achieve Goals: Good ADL Goals Pt Will Perform Upper Body Dressing: with set-up;sitting Pt Will Perform Lower Body Dressing: with min assist;sit to/from stand;sitting/lateral leans Pt Will Transfer to Toilet: with min assist;bedside commode;stand pivot transfer Pt Will Perform Toileting - Clothing Manipulation and hygiene: with min assist;sitting/lateral leans;sit to/from stand  Plan      Co-evaluation    PT/OT/SLP Co-Evaluation/Treatment: Yes Reason for Co-Treatment: Other (comment) (+3, activity tolerance)   OT goals addressed during session: ADL's and self-care;Strengthening/ROM      AM-PAC OT 6 Clicks Daily Activity     Outcome Measure   Help from another person eating meals?: None Help from another person taking care of personal grooming?: A Little Help from another person toileting, which includes using toliet, bedpan, or urinal?: A Lot Help from another person bathing (including washing, rinsing, drying)?: A Lot Help from another  person to put on and taking off regular upper body clothing?: A Little Help from another person to put on and taking off regular lower body clothing?: A Lot 6 Click Score: 16    End of Session    OT Visit Diagnosis: Unsteadiness on feet (R26.81);Other abnormalities of gait and mobility (R26.89);Muscle weakness (generalized) (M62.81)   Activity Tolerance Patient limited by fatigue   Patient Left in bed;with call bell/phone within reach;with bed alarm set   Nurse Communication Mobility status        Time: 1256-1316 OT Time Calculation (min): 20 min  Charges: OT General Charges $OT Visit: 1 Visit OT Treatments $Therapeutic Activity: 8-22 mins  Shalamar Crays K, OTD, OTR/L SecureChat Preferred Acute Rehab [phone]   Laneta POUR Koonce 01/16/2024, 1:59 PM

## 2024-01-17 LAB — BASIC METABOLIC PANEL WITH GFR
Anion gap: 13 (ref 5–15)
BUN: 74 mg/dL — ABNORMAL HIGH (ref 8–23)
CO2: 25 mmol/L (ref 22–32)
Calcium: 8.9 mg/dL (ref 8.9–10.3)
Chloride: 95 mmol/L — ABNORMAL LOW (ref 98–111)
Creatinine, Ser: 5.89 mg/dL — ABNORMAL HIGH (ref 0.61–1.24)
GFR, Estimated: 10 mL/min — ABNORMAL LOW (ref >60)
Glucose, Bld: 125 mg/dL — ABNORMAL HIGH (ref 70–99)
Potassium: 4.9 mmol/L (ref 3.5–5.1)
Sodium: 133 mmol/L — ABNORMAL LOW (ref 135–145)

## 2024-01-17 LAB — CBC
HCT: 23.7 % — ABNORMAL LOW (ref 39.0–52.0)
Hemoglobin: 7.6 g/dL — ABNORMAL LOW (ref 13.0–17.0)
MCH: 30.9 pg (ref 26.0–34.0)
MCHC: 32.1 g/dL (ref 30.0–36.0)
MCV: 96.3 fL (ref 80.0–100.0)
Platelets: 323 K/uL (ref 150–400)
RBC: 2.46 MIL/uL — ABNORMAL LOW (ref 4.22–5.81)
RDW: 16.4 % — ABNORMAL HIGH (ref 11.5–15.5)
WBC: 15.4 K/uL — ABNORMAL HIGH (ref 4.0–10.5)
nRBC: 0 % (ref 0.0–0.2)

## 2024-01-17 LAB — GLUCOSE, CAPILLARY
Glucose-Capillary: 119 mg/dL — ABNORMAL HIGH (ref 70–99)
Glucose-Capillary: 128 mg/dL — ABNORMAL HIGH (ref 70–99)
Glucose-Capillary: 141 mg/dL — ABNORMAL HIGH (ref 70–99)
Glucose-Capillary: 192 mg/dL — ABNORMAL HIGH (ref 70–99)
Glucose-Capillary: 172 mg/dL — ABNORMAL HIGH (ref 70–99)

## 2024-01-17 MED ORDER — HEPARIN SODIUM (PORCINE) 1000 UNIT/ML IJ SOLN
INTRAMUSCULAR | Status: AC
  Filled 2024-01-17: qty 8

## 2024-01-17 MED ORDER — PANTOPRAZOLE SODIUM 40 MG PO TBEC
40.0000 mg | DELAYED_RELEASE_TABLET | Freq: Every day | ORAL | Status: DC
  Administered 2024-01-17 – 2024-01-18 (×2): 40 mg via ORAL
  Filled 2024-01-17 (×2): qty 1

## 2024-01-17 NOTE — Progress Notes (Signed)
°   01/17/24 1142  Vitals  Temp 97.8 F (36.6 C)  Pulse Rate 61  Resp 19  BP (!) 150/74  SpO2 99 %  Weight 134.7 kg  Oxygen Therapy  Patient Activity (if Appropriate) In bed  Pulse Oximetry Type Continuous  Oximetry Probe Site Changed No  Post Treatment  Dialyzer Clearance Lightly streaked  Hemodialysis Intake (mL) 0 mL  Liters Processed 68.3  Fluid Removed (mL) 1000 mL  Tolerated HD Treatment Yes  Post-Hemodialysis Comments Pt. vitally stable and no complaints

## 2024-01-17 NOTE — Progress Notes (Signed)
 Pt. Came in awake and oriented and with no complaints. Consent verified and on file. Tx started with no complications. Seen by Dr. Lamar Fret  UF removed: Tx Duration: 3.25 hours  Access used: Right CVC Access Issue: None  Tx completed and tolerated. Catheter dwelled and locked with heparin  Endorsed to floor nurse. Transported to room.  Alucard Fearnow Rubi Zorion Nims, RN [facility]

## 2024-01-17 NOTE — Progress Notes (Signed)
 PT Cancellation Note  Patient Details Name: DECKLAN MAU MRN: [id number] DOB: 10/29/1955   Cancelled Treatment:    Reason Eval/Treat Not Completed: (P) Patient at procedure or test/unavailable. Pt at HD. Will plan to follow-up later as time permits.   Theo Ferretti, PT, DPT Acute Rehabilitation Services  Office: [phone]    Theo CHRISTELLA Ferretti 01/17/2024, 11:17 AM

## 2024-01-17 NOTE — Progress Notes (Signed)
 Pt arrived from ...2H.., A/ox .4.Marland Kitchenpt denies any pain, MD aware,CCMD called. CHG bath given,no further needs at this time

## 2024-01-17 NOTE — Progress Notes (Signed)
 cw

## 2024-01-17 NOTE — Plan of Care (Signed)
°  Problem: Education: Goal: Knowledge of General Education information will improve Description: Including pain rating scale, medication(s)/side effects and non-pharmacologic comfort measures Outcome: Progressing   Problem: Clinical Measurements: Goal: Diagnostic test results will improve Outcome: Progressing Goal: Respiratory complications will improve Outcome: Progressing Goal: Cardiovascular complication will be avoided Outcome: Progressing   Problem: Activity: Goal: Risk for activity intolerance will decrease Outcome: Progressing   Problem: Nutrition: Goal: Adequate nutrition will be maintained Outcome: Progressing   Problem: Elimination: Goal: Will not experience complications related to urinary retention Outcome: Progressing   Problem: Pain Managment: Goal: General experience of comfort will improve and/or be controlled Outcome: Progressing   Problem: Safety: Goal: Ability to remain free from injury will improve Outcome: Progressing   Problem: Activity: Goal: Ability to return to baseline activity level will improve Outcome: Progressing   Problem: Education: Goal: Knowledge of disease or condition will improve Outcome: Progressing Goal: Knowledge of secondary prevention will improve (MUST DOCUMENT ALL) Outcome: Progressing Goal: Knowledge of patient specific risk factors will improve (DELETE if not current risk factor) Outcome: Progressing

## 2024-01-17 NOTE — Progress Notes (Signed)
° °   °  [address] [place] [address]             [phone]                7 Days Post-Op Procedures (LRB): TRANSCAROTID ARTERY REVASCULARIZATION (TCAR) (Left)   Events: No events _______________________________________________________________ Vitals: BP 130/72   Pulse 64   Temp 97.9 F (36.6 C)   Resp 14   Ht 6' (1.829 m)   Wt (!) 136.1 kg   SpO2 96%   BMI 40.69 kg/m  Filed Weights   01/16/24 1500 01/17/24 0500 01/17/24 0805  Weight: 132.4 kg 133.9 kg (!) 136.1 kg     - Neuro: alert NAD.    - Cardiovascular: sinus   Drips: none    - Pulm: EWOB  ABG    Component Value Date/Time   PHART 7.430 01/12/2024 1028   PCO2ART 34.4 01/12/2024 1028   PO2ART 71 (L) 01/12/2024 1028   HCO3 22.9 01/12/2024 1028   TCO2 24 01/12/2024 1028   ACIDBASEDEF 1.0 01/12/2024 1028   O2SAT 95 01/12/2024 1028    - Abd: ND - Extremity: trace edema  .Intake/Output      12/16 0701 12/17 0700 12/17 0701 12/18 0700   P.O.     I.V. (mL/kg)     NG/GT     Total Intake(mL/kg)     Urine (mL/kg/hr) 405 (0.1)    Total Output 405    Net -405            _______________________________________________________________ Labs:    Latest Ref Rng & Units 01/17/2024    3:51 AM 01/15/2024    4:57 AM 01/14/2024    4:31 AM  CBC  WBC 4.0 - 10.5 K/uL 15.4  15.9  14.5   Hemoglobin 13.0 - 17.0 g/dL 7.6  7.5  7.6   Hematocrit 39.0 - 52.0 % 23.7  24.2  24.1   Platelets 150 - 400 K/uL 323  294  255       Latest Ref Rng & Units 01/17/2024    3:51 AM 01/16/2024    1:52 AM 01/15/2024    4:57 AM  CMP  Glucose 70 - 99 mg/dL 874  848  92   BUN 8 - 23 mg/dL 74  50  79   Creatinine 0.61 - 1.24 mg/dL 4.10  5.79  4.50   Sodium 135 - 145 mmol/L 133  133  136   Potassium 3.5 - 5.1 mmol/L 4.9  4.0  4.6   Chloride 98 - 111 mmol/L 95  97  105   CO2 22 - 32 mmol/L 25  26  21    Calcium  8.9 - 10.3 mg/dL 8.9  7.9  8.1      CXR: -  _______________________________________________________________  Assessment and Plan: POD 9 s/p CABG  POD 7 s/p TCAR  Neuro: pain controlled.   CV: on A/S/BB, plavix  Pulm: IS, ambulation Renal: ESRD.  On IHD GI: on diet Heme: stable.  On plavix  ID: afebrile Endo: SSI Dispo: floor   Shams Fill O Deema Juncaj 01/17/2024 10:20 AM

## 2024-01-17 NOTE — Progress Notes (Deleted)
° °  Cardiology Office Note:    Date:  01/17/2024   ID:  Timothy Holloway, DOB [birth year], MRN [id number]  PCP:  Baird Comer GAILS, NP  Cardiologist:  Vina Gull, MD { Click to update primary MD,subspecialty MD or APP then REFRESH:1}    Referring MD: Baird Comer GAILS, NP   Chief Complaint: ***  History of Present Illness:    Timothy Holloway is a 68 y.o. male with a history of ***           EKGs/Labs/Other Studies Reviewed:    The following studies were reviewed today: ***  EKG:  EKG *** ordered today. EKG personally reviewed and demonstrates ***.  Recent Labs: 10/21/2023: ALT 15 01/05/2024: TSH 2.190 01/16/2024: Magnesium  2.1 01/17/2024: BUN 74; Creatinine, Ser 5.89; Hemoglobin 7.6; Platelets 323; Potassium 4.9; Sodium 133  Recent Lipid Panel    Component Value Date/Time   CHOL 88 01/06/2024 0417   CHOL 106 08/06/2012 0931   TRIG 103 01/14/2024 0431   TRIG 116 11/09/2012 0938   TRIG 92 08/06/2012 0931   HDL 34 (L) 01/06/2024 0417   HDL 35 (L) 11/09/2012 0938   HDL 40 08/06/2012 0931   CHOLHDL 2.6 01/06/2024 0417   VLDL 21 01/06/2024 0417   LDLCALC 33 01/06/2024 0417   LDLCALC 35 11/09/2012 0938   LDLCALC 48 08/06/2012 0931    Physical Exam:    Vital Signs: There were no vitals taken for this visit.    Wt Readings from Last 3 Encounters:  01/17/24 (!) 300 lb 0.7 oz (136.1 kg)  10/21/23 290 lb (131.5 kg)  08/21/23 289 lb (131.1 kg)     General: 68 y.o. male in no acute distress. HEENT: Normocephalic and atraumatic. Sclera clear.  Neck: Supple. No carotid bruits. No JVD. Heart: *** RRR. Distinct S1 and S2. No murmurs, gallops, or rubs.  Lungs: No increased work of breathing. Clear to ausculation bilaterally. No wheezes, rhonchi, or rales.  Abdomen: Soft, non-distended, and non-tender to palpation.  Extremities: No lower extremity edema.  Radial and distal pedal pulses 2+ and equal bilaterally. Skin: Warm and dry. Neuro: No focal  deficits. Psych: Normal affect. Responds appropriately.   Assessment:    No diagnosis found.  Plan:     Disposition: Follow up in ***   Signed, Aline FORBES Door, PA-C  01/17/2024 9:35 AM    [facility]

## 2024-01-17 NOTE — Progress Notes (Signed)
°  [facility] Progress Note   Subjective:    Seen on HD  UF goal 1 L Bp's staying up  Objective Vitals:   01/17/24 1134 01/17/24 1142 01/17/24 1218 01/17/24 1548  BP: 135/80 (!) 150/74 (!) 165/72 (!) 121/54  Pulse: 60 61 60 72  Resp: 17 19  20   Temp:  97.8 F (36.6 C)  97.8 F (36.6 C)  TempSrc:    Oral  SpO2: 100% 99% 100% 100%  Weight:  134.7 kg    Height:       Physical Exam General: alert, nad, on RA Walking in the room w/ PT Heart: RRR Lungs: CTA bilat  Abdomen: soft, NTND Extremities: trace LE edema Dialysis Access: [facility]  Neuro: alert, nonfocal, ox 3    Dialysis Orders: [facility] MWF 4h B350  133.5kg  TDC  Heparin  2000+ 1400u/hr Calcitriol 1.25 three times per week Mircera 90 mcg q 2 wks (last 12/1)    CXR 12/11-> resolved vasc congestion, improved IS edema   Assessment/Plan: ESRD - usual HD MWF. CRRT used from 12/10 - 12/13 for BP stability in the face of stroke-like symptoms. Stable off of CRRT. Tolerated regular HD Monday. Next HD today.  NSTEMI - 3V CAD by LHC -> s/p CABG 12/08 Stroke-like symptoms: was waxing/ waning per neuro. There was no acute CVA by imaging, but due to advanced carotid disease by CTA pt underwent L carotid artery stent per [facility] 12/10.  BP: labile, some low and most 110- 130 Volume - vasc congestion resolved w/ last CXR. Now on RA. Standing wt 12/16 was 134.2kg. 1 L off w/ HD today.  Anemia - Hgb 7- 9 recently. Getting ESA here w/ darbe 100 mcg weekly. No iron with high ferritin.  Transfuse prn.  Nutrition - Renal diet with fluid restriction    Myer Fret  MD  [facility] 01/17/2024, 5:13 PM  Recent Labs  Lab 01/15/24 0457 01/16/24 0152 01/17/24 0351  HGB 7.5*  --  7.6*  ALBUMIN  1.9* 2.5*  --   CALCIUM  8.1* 7.9* 8.9  PHOS 4.2  4.0 3.3  --   CREATININE 5.49* 4.20* 5.89*  K 4.6 4.0 4.9    Inpatient medications:  aspirin  EC  81 mg Oral Daily   atorvastatin   80 mg Oral Daily   bisacodyl   10 mg Oral Daily   Or    bisacodyl   10 mg Rectal Daily   Chlorhexidine  Gluconate Cloth  6 each Topical Q0600   Chlorhexidine  Gluconate Cloth  6 each Topical Q0600   clopidogrel   75 mg Oral Daily   darbepoetin (ARANESP ) injection - DIALYSIS  100 mcg Subcutaneous Q Thu-1800   Gerhardt's butt cream   Topical Daily   heparin  injection (subcutaneous)  5,000 Units Subcutaneous Q8H   insulin  aspart  0-24 Units Subcutaneous TID AC & HS   insulin  aspart  5 Units Subcutaneous TID WC   insulin  glargine  25 Units Subcutaneous BID   lidocaine   2 patch Transdermal Q24H   multivitamin  1 tablet Oral QHS   pantoprazole   40 mg Oral Daily   sodium chloride  flush  10-40 mL Intracatheter Q12H   sodium chloride  flush  3 mL Intravenous Q12H     albuterol , midodrine , ondansetron  (ZOFRAN ) IV, mouth rinse, oxyCODONE , sodium chloride  flush, sodium chloride  flush, traMADol , zolpidem

## 2024-01-17 NOTE — Progress Notes (Signed)
 Inpatient Rehab Admissions Coordinator:   I was able to speak to pt's spouse, [name], on the phone this morning.  She confirms that she can provide 24/7 supervision at discharge, not physical assist.  I believe based on pt's progress so far this is reasonable.  I will send to [facility] today for prior auth request and follow for determination.   Reche Lowers, PT, DPT Admissions Coordinator [phone] 01/17/2024 11:06 AM

## 2024-01-17 NOTE — Progress Notes (Signed)
 Physical Therapy Treatment Patient Details Name: Timothy Holloway MRN: [id number] DOB: [birth year] Today's Date: 01/17/2024   History of Present Illness 68 yo M adm 12/5 to [facility] due to CP with troponin level 1121 and glucose 306. 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 CRRT 12/10-12/13. Code stroke re-activated 12/10 for increased R sided weakness. 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 S/p emergent L TCAR 12/10. PMH ESRD on HD (MWF) DM2, obese, HTN HLD, depression, anemia insomnia CKD steal syndrome, R and L fistula ,back surg    PT Comments  Upon arrival, pt was sitting in the recliner. His RN reports pt has been sitting up for ~1 hour. Pt expressed feeling lightheaded while sitting there prior to attempting any mobility with PT. Upon standing, his lightheadedness worsened, see BP measures below. RN notified of BP/symptoms. Due to his symptoms and hx of syncope this admission, deferred further mobility other than transferring from the recliner to the chair. The pt displayed good sternal precautions compliance, needing minA for balance to step pivot but modA to transfer to stand from the recliner due to leg weakness and it being a short surface to stand from for a tall man. Focused remainder of session on lower extremity strengthening while pt was in bed. He displays particular weakness in his gluteal muscles. Will continue to follow acutely.  BP --  122/51 (72) sitting in chair 109/47 (64) supine after standing and transferring to bed 122/80 (94) with prolonged supine position     If plan is discharge home, recommend the following: Two people to help with walking and/or transfers;Two people to help with  bathing/dressing/bathroom;Assistance with cooking/housework;Assistance with feeding;Direct supervision/assist for medications management;Direct supervision/assist for financial management;Assist for transportation;Help with stairs or ramp for entrance   Can travel by private vehicle        Equipment Recommendations  Rolling walker (2 wheels);Hospital bed;BSC/3in1;Wheelchair (measurements PT);Wheelchair cushion (measurements PT) (pending progress)    Recommendations for Other Services       Precautions / Restrictions Precautions Precautions: Fall;Sternal Precaution Booklet Issued: No Recall of Precautions/Restrictions: Intact Precaution/Restrictions Comments: SBP > 100 goal, syncope (delayed ~30 min after sitting up) with nursing 12/13, x2 abdominal binders, TED hose Restrictions Weight Bearing Restrictions Per Provider Order: Yes Other Position/Activity Restrictions: Sternal precautions     Mobility  Bed Mobility Overal bed mobility: Needs Assistance Bed Mobility: Sit to Sidelying         Sit to sidelying: Min assist General bed mobility comments: Cued pt to lean laterally to L side while therapist provided minA to lift legs onto bed    Transfers Overall transfer level: Needs assistance Equipment used: Rolling walker (2 wheels) Transfers: Sit to/from Stand, Bed to chair/wheelchair/BSC Sit to Stand: Mod assist   Step pivot transfers: Min assist       General transfer comment: ModA to gain balance and power up to stand from recliner with pt complying to his sternal precautions by hugging his heart pillow. verbal and tactile cues provided to extend hips and lift chest for more upright posture. MinA needed for balance and safety to step pivot to R from chair to bed    Ambulation/Gait Ambulation/Gait assistance: Min assist Gait Distance (Feet): 3 Feet Assistive device: Rolling walker (2 wheels) Gait  Pattern/deviations: Step-to pattern, Decreased stride length, Wide base  of support, Antalgic Gait velocity: reduced Gait velocity interpretation: <1.31 ft/sec, indicative of household ambulator   General Gait Details: Limited distance due to pt becoming increasingly lightheaded when standing. Pt needed minA to step pivot to R from recliner to bed with RW support. Pt expressed L knee chronic pain/issues with gait   Stairs             Wheelchair Mobility     Tilt Bed    Modified Rankin (Stroke Patients Only)       Balance Overall balance assessment: Needs assistance Sitting-balance support: No upper extremity supported, Feet supported Sitting balance-Leahy Scale: Fair Sitting balance - Comments: static sitting EOB with CGA for safety   Standing balance support: Bilateral upper extremity supported, During functional activity, Reliant on assistive device for balance Standing balance-Leahy Scale: Poor Standing balance comment: reliant on RW and minA                            Communication Communication Communication: No apparent difficulties  Cognition Arousal: Alert Behavior During Therapy: WFL for tasks assessed/performed, Flat affect   PT - Cognitive impairments: Attention, Problem solving                       PT - Cognition Comments: cues for mobility and increased time needed Following commands: Intact      Cueing Cueing Techniques: Verbal cues, Tactile cues  Exercises General Exercises - Lower Extremity Ankle Circles/Pumps: AROM, Both, 10 reps, Supine Straight Leg Raises: AROM, Strengthening, Both, 10 reps, Supine Other Exercises Other Exercises: x5 bridges supine    General Comments General comments (skin integrity, edema, etc.): BP 122/51 (72) sitting in chair, 109/47 (64) supine after standing and transferring to bed, 122/80 (94) with prolonged supine position; pt reported lightheadedness at rest upon arrival, which worsened with standing and improved with prolonged supine position, RN made aware       Pertinent Vitals/Pain Pain Assessment Pain Assessment: Faces Faces Pain Scale: Hurts little more Pain Location: chest with movement, chronic L knee Pain Descriptors / Indicators: Operative site guarding, Discomfort, Grimacing, Guarding Pain Intervention(s): Limited activity within patient's tolerance, Monitored during session, Repositioned    Home Living                          Prior Function            PT Goals (current goals can now be found in the care plan section) Acute Rehab PT Goals Patient Stated Goal: to improve PT Goal Formulation: With patient Time For Goal Achievement: 01/25/24 Potential to Achieve Goals: Fair Progress towards PT goals: Progressing toward goals    Frequency    Min 3X/week      PT Plan      Co-evaluation              AM-PAC PT 6 Clicks Mobility   Outcome Measure  Help needed turning from your back to your side while in a flat bed without using bedrails?: A Little Help needed moving from lying on your back to sitting on the side of a flat bed without using bedrails?: A Little Help needed moving to and from a bed to a chair (including a wheelchair)?: A Little Help needed standing up from a chair using your arms (e.g., wheelchair or bedside chair)?: A Lot Help needed to  walk in hospital room?: Total Help needed climbing 3-5 steps with a railing? : Total 6 Click Score: 13    End of Session Equipment Utilized During Treatment: Gait belt Activity Tolerance: Treatment limited secondary to medical complications (Comment) (limited by BP dropping) Patient left: in bed;with call bell/phone within reach;with bed alarm set Nurse Communication: Mobility status;Other (comment) (BP) PT Visit Diagnosis: Unsteadiness on feet (R26.81);Other abnormalities of gait and mobility (R26.89);Muscle weakness (generalized) (M62.81);Difficulty in walking, not elsewhere classified (R26.2);Pain Pain - Right/Left:  (sternum) Pain - part of body:   (sternum)     Time: 8399-8378 PT Time Calculation (min) (ACUTE ONLY): 21 min  Charges:    $Therapeutic Exercise: 8-22 mins PT General Charges $$ ACUTE PT VISIT: 1 Visit                     Theo Ferretti, PT, DPT Acute Rehabilitation Services  Office: [phone]    Theo CHRISTELLA Ferretti 01/17/2024, 5:14 PM

## 2024-01-18 ENCOUNTER — Inpatient Hospital Stay (HOSPITAL_COMMUNITY)

## 2024-01-18 ENCOUNTER — Other Ambulatory Visit: Payer: Self-pay

## 2024-01-18 ENCOUNTER — Inpatient Hospital Stay (HOSPITAL_COMMUNITY)
Admission: AD | Admit: 2024-01-18 | Discharge: 2024-02-03 | DRG: 056 | Disposition: A | Discharge status: Home Health | Source: Intra-hospital | Attending: Physical Medicine and Rehabilitation | Admitting: Physical Medicine and Rehabilitation

## 2024-01-18 DIAGNOSIS — E871 Hypo-osmolality and hyponatremia: Secondary | ICD-10-CM | POA: Diagnosis not present

## 2024-01-18 DIAGNOSIS — Z6841 Body Mass Index (BMI) 40.0 and over, adult: Secondary | ICD-10-CM | POA: Diagnosis not present

## 2024-01-18 DIAGNOSIS — I2510 Atherosclerotic heart disease of native coronary artery without angina pectoris: Secondary | ICD-10-CM | POA: Diagnosis present

## 2024-01-18 DIAGNOSIS — R5383 Other fatigue: Secondary | ICD-10-CM | POA: Diagnosis present

## 2024-01-18 DIAGNOSIS — E11649 Type 2 diabetes mellitus with hypoglycemia without coma: Secondary | ICD-10-CM | POA: Diagnosis not present

## 2024-01-18 DIAGNOSIS — R0982 Postnasal drip: Secondary | ICD-10-CM | POA: Diagnosis present

## 2024-01-18 DIAGNOSIS — Z833 Family history of diabetes mellitus: Secondary | ICD-10-CM

## 2024-01-18 DIAGNOSIS — E8770 Fluid overload, unspecified: Secondary | ICD-10-CM | POA: Diagnosis not present

## 2024-01-18 DIAGNOSIS — I639 Cerebral infarction, unspecified: Secondary | ICD-10-CM | POA: Diagnosis present

## 2024-01-18 DIAGNOSIS — I214 Non-ST elevation (NSTEMI) myocardial infarction: Secondary | ICD-10-CM | POA: Diagnosis present

## 2024-01-18 DIAGNOSIS — Z951 Presence of aortocoronary bypass graft: Secondary | ICD-10-CM

## 2024-01-18 DIAGNOSIS — I6310 Cerebral infarction due to embolism of unspecified precerebral artery: Secondary | ICD-10-CM | POA: Diagnosis not present

## 2024-01-18 DIAGNOSIS — I6522 Occlusion and stenosis of left carotid artery: Secondary | ICD-10-CM | POA: Diagnosis present

## 2024-01-18 DIAGNOSIS — I120 Hypertensive chronic kidney disease with stage 5 chronic kidney disease or end stage renal disease: Secondary | ICD-10-CM | POA: Diagnosis present

## 2024-01-18 DIAGNOSIS — Z91040 Latex allergy status: Secondary | ICD-10-CM

## 2024-01-18 DIAGNOSIS — D72829 Elevated white blood cell count, unspecified: Secondary | ICD-10-CM | POA: Diagnosis present

## 2024-01-18 DIAGNOSIS — L89152 Pressure ulcer of sacral region, stage 2: Secondary | ICD-10-CM

## 2024-01-18 DIAGNOSIS — F5104 Psychophysiologic insomnia: Secondary | ICD-10-CM | POA: Diagnosis present

## 2024-01-18 DIAGNOSIS — E66813 Obesity, class 3: Secondary | ICD-10-CM | POA: Diagnosis present

## 2024-01-18 DIAGNOSIS — I69320 Aphasia following cerebral infarction: Principal | ICD-10-CM

## 2024-01-18 DIAGNOSIS — R001 Bradycardia, unspecified: Secondary | ICD-10-CM | POA: Diagnosis present

## 2024-01-18 DIAGNOSIS — L89150 Pressure ulcer of sacral region, unstageable: Secondary | ICD-10-CM | POA: Diagnosis present

## 2024-01-18 DIAGNOSIS — N2581 Secondary hyperparathyroidism of renal origin: Secondary | ICD-10-CM | POA: Diagnosis present

## 2024-01-18 DIAGNOSIS — I951 Orthostatic hypotension: Secondary | ICD-10-CM | POA: Diagnosis present

## 2024-01-18 DIAGNOSIS — E1122 Type 2 diabetes mellitus with diabetic chronic kidney disease: Secondary | ICD-10-CM | POA: Diagnosis present

## 2024-01-18 DIAGNOSIS — E785 Hyperlipidemia, unspecified: Secondary | ICD-10-CM | POA: Diagnosis present

## 2024-01-18 DIAGNOSIS — Z807 Family history of other malignant neoplasms of lymphoid, hematopoietic and related tissues: Secondary | ICD-10-CM

## 2024-01-18 DIAGNOSIS — Z992 Dependence on renal dialysis: Secondary | ICD-10-CM

## 2024-01-18 DIAGNOSIS — I69398 Other sequelae of cerebral infarction: Secondary | ICD-10-CM

## 2024-01-18 DIAGNOSIS — Z79899 Other long term (current) drug therapy: Secondary | ICD-10-CM

## 2024-01-18 DIAGNOSIS — E119 Type 2 diabetes mellitus without complications: Secondary | ICD-10-CM

## 2024-01-18 DIAGNOSIS — I6340 Cerebral infarction due to embolism of unspecified cerebral artery: Secondary | ICD-10-CM | POA: Diagnosis not present

## 2024-01-18 DIAGNOSIS — Z7982 Long term (current) use of aspirin: Secondary | ICD-10-CM

## 2024-01-18 DIAGNOSIS — R5381 Other malaise: Secondary | ICD-10-CM | POA: Diagnosis present

## 2024-01-18 DIAGNOSIS — N186 End stage renal disease: Secondary | ICD-10-CM | POA: Diagnosis present

## 2024-01-18 DIAGNOSIS — I959 Hypotension, unspecified: Secondary | ICD-10-CM | POA: Diagnosis not present

## 2024-01-18 DIAGNOSIS — I953 Hypotension of hemodialysis: Secondary | ICD-10-CM | POA: Diagnosis not present

## 2024-01-18 DIAGNOSIS — G4700 Insomnia, unspecified: Secondary | ICD-10-CM | POA: Diagnosis present

## 2024-01-18 DIAGNOSIS — Z794 Long term (current) use of insulin: Secondary | ICD-10-CM

## 2024-01-18 DIAGNOSIS — I63132 Cerebral infarction due to embolism of left carotid artery: Principal | ICD-10-CM

## 2024-01-18 DIAGNOSIS — E1169 Type 2 diabetes mellitus with other specified complication: Secondary | ICD-10-CM | POA: Diagnosis not present

## 2024-01-18 DIAGNOSIS — R490 Dysphonia: Secondary | ICD-10-CM | POA: Diagnosis present

## 2024-01-18 DIAGNOSIS — D631 Anemia in chronic kidney disease: Secondary | ICD-10-CM | POA: Diagnosis present

## 2024-01-18 DIAGNOSIS — K5900 Constipation, unspecified: Secondary | ICD-10-CM | POA: Diagnosis present

## 2024-01-18 DIAGNOSIS — R471 Dysarthria and anarthria: Secondary | ICD-10-CM | POA: Diagnosis present

## 2024-01-18 DIAGNOSIS — R1310 Dysphagia, unspecified: Secondary | ICD-10-CM | POA: Diagnosis present

## 2024-01-18 DIAGNOSIS — J9811 Atelectasis: Secondary | ICD-10-CM | POA: Diagnosis present

## 2024-01-18 DIAGNOSIS — I10 Essential (primary) hypertension: Secondary | ICD-10-CM | POA: Diagnosis not present

## 2024-01-18 LAB — ECHO INTRAOPERATIVE TEE
AR max vel: 3.27 cm2
AV Area VTI: 2.74 cm2
AV Area mean vel: 3.18 cm2
AV Mean grad: 3 mmHg
AV Peak grad: 5.1 mmHg
Ao pk vel: 1.13 m/s
Height: 72 in
MV VTI: 1.86 cm2
Weight: 4720 [oz_av]

## 2024-01-18 LAB — GLUCOSE, CAPILLARY
Glucose-Capillary: 159 mg/dL — ABNORMAL HIGH (ref 70–99)
Glucose-Capillary: 141 mg/dL — ABNORMAL HIGH (ref 70–99)
Glucose-Capillary: 196 mg/dL — ABNORMAL HIGH (ref 70–99)
Glucose-Capillary: 198 mg/dL — ABNORMAL HIGH (ref 70–99)
Glucose-Capillary: 224 mg/dL — ABNORMAL HIGH (ref 70–99)

## 2024-01-18 MED ORDER — PANTOPRAZOLE SODIUM 40 MG PO TBEC: 40.0000 mg | DELAYED_RELEASE_TABLET | Freq: Every day | ORAL | Status: DC

## 2024-01-18 MED ORDER — CLOPIDOGREL BISULFATE 75 MG PO TABS
75.0000 mg | ORAL_TABLET | Freq: Every day | ORAL | Status: DC
  Administered 2024-01-19 – 2024-02-03 (×16): 75 mg via ORAL
  Filled 2024-01-18 (×11): qty 1

## 2024-01-18 MED ORDER — DARBEPOETIN ALFA 100 MCG/0.5ML IJ SOSY: 100.0000 ug | PREFILLED_SYRINGE | INTRAMUSCULAR | Status: DC

## 2024-01-18 MED ORDER — ZOLPIDEM TARTRATE 5 MG PO TABS
5.0000 mg | ORAL_TABLET | Freq: Every day | ORAL | Status: DC
  Administered 2024-01-18 – 2024-02-02 (×16): 5 mg via ORAL
  Filled 2024-01-18 (×12): qty 1

## 2024-01-18 MED ORDER — INSULIN ASPART 100 UNIT/ML IJ SOLN
0.0000 [IU] | Freq: Three times a day (TID) | INTRAMUSCULAR | Status: DC
  Administered 2024-01-18: 18:00:00 2 [IU] via SUBCUTANEOUS
  Administered 2024-01-19 – 2024-01-20 (×3): 1 [IU] via SUBCUTANEOUS
  Filled 2024-01-18: qty 2
  Filled 2024-01-18 (×2): qty 1

## 2024-01-18 MED ORDER — PROCHLORPERAZINE EDISYLATE 10 MG/2ML IJ SOLN: 5.0000 mg | Freq: Four times a day (QID) | INTRAMUSCULAR | Status: DC | PRN

## 2024-01-18 MED ORDER — INSULIN ASPART 100 UNIT/ML IJ SOLN
0.0000 [IU] | Freq: Every day | INTRAMUSCULAR | Status: DC
  Administered 2024-01-18 – 2024-01-19 (×2): 2 [IU] via SUBCUTANEOUS
  Filled 2024-01-18 (×2): qty 2

## 2024-01-18 MED ORDER — DIPHENHYDRAMINE HCL 25 MG PO CAPS
25.0000 mg | ORAL_CAPSULE | Freq: Four times a day (QID) | ORAL | Status: DC | PRN
  Administered 2024-01-21: 25 mg via ORAL
  Filled 2024-01-18 (×2): qty 1

## 2024-01-18 MED ORDER — MELATONIN 5 MG PO TABS
5.0000 mg | ORAL_TABLET | Freq: Every evening | ORAL | Status: DC | PRN
  Administered 2024-01-27 – 2024-02-03 (×2): 5 mg via ORAL
  Filled 2024-01-18 (×2): qty 1

## 2024-01-18 MED ORDER — ALBUTEROL SULFATE (2.5 MG/3ML) 0.083% IN NEBU: 2.5000 mg | INHALATION_SOLUTION | RESPIRATORY_TRACT | Status: AC | PRN

## 2024-01-18 MED ORDER — SIMETHICONE 80 MG PO CHEW: 80.0000 mg | CHEWABLE_TABLET | Freq: Four times a day (QID) | ORAL | Status: DC | PRN

## 2024-01-18 MED ORDER — ASPIRIN 81 MG PO TBEC: 81.0000 mg | DELAYED_RELEASE_TABLET | Freq: Every day | ORAL | Status: DC

## 2024-01-18 MED ORDER — INSULIN GLARGINE 100 UNIT/ML ~~LOC~~ SOLN
25.0000 [IU] | Freq: Two times a day (BID) | SUBCUTANEOUS | Status: DC
  Administered 2024-01-18 – 2024-01-21 (×7): 25 [IU] via SUBCUTANEOUS
  Filled 2024-01-18 (×9): qty 0.25

## 2024-01-18 MED ORDER — ALBUTEROL SULFATE (2.5 MG/3ML) 0.083% IN NEBU: 2.5000 mg | INHALATION_SOLUTION | RESPIRATORY_TRACT | Status: DC | PRN

## 2024-01-18 MED ORDER — RENA-VITE PO TABS: 1.0000 | ORAL_TABLET | Freq: Every day | ORAL | Status: AC

## 2024-01-18 MED ORDER — ORAL CARE MOUTH RINSE: 15.0000 mL | OROMUCOSAL | Status: DC | PRN

## 2024-01-18 MED ORDER — DARBEPOETIN ALFA 100 MCG/0.5ML IJ SOSY
100.0000 ug | PREFILLED_SYRINGE | INTRAMUSCULAR | Status: DC
  Administered 2024-01-18: 19:00:00 100 ug via SUBCUTANEOUS
  Filled 2024-01-18: qty 0.5

## 2024-01-18 MED ORDER — PANTOPRAZOLE SODIUM 40 MG PO TBEC
40.0000 mg | DELAYED_RELEASE_TABLET | Freq: Every day | ORAL | Status: DC
  Administered 2024-01-19 – 2024-02-03 (×16): 40 mg via ORAL
  Filled 2024-01-18 (×11): qty 1

## 2024-01-18 MED ORDER — OXYCODONE HCL 5 MG PO TABS: 2.5000 mg | ORAL_TABLET | ORAL | Status: DC | PRN

## 2024-01-18 MED ORDER — SORBITOL 70 % SOLN
60.0000 mL | Freq: Every day | Status: DC | PRN
  Administered 2024-02-01: 60 mL via ORAL

## 2024-01-18 MED ORDER — ATORVASTATIN CALCIUM 80 MG PO TABS
80.0000 mg | ORAL_TABLET | Freq: Every day | ORAL | Status: DC
  Administered 2024-01-19 – 2024-02-03 (×16): 80 mg via ORAL
  Filled 2024-01-18 (×11): qty 1

## 2024-01-18 MED ORDER — MIDODRINE HCL 5 MG PO TABS: 5.0000 mg | ORAL_TABLET | Freq: Three times a day (TID) | ORAL | Status: DC | PRN

## 2024-01-18 MED ORDER — TRAMADOL HCL 50 MG PO TABS
50.0000 mg | ORAL_TABLET | Freq: Two times a day (BID) | ORAL | Status: DC | PRN
  Administered 2024-01-20 – 2024-02-03 (×11): 50 mg via ORAL
  Filled 2024-01-18 (×12): qty 1

## 2024-01-18 MED ORDER — JUVEN PO PACK
1.0000 | PACK | Freq: Two times a day (BID) | ORAL | Status: DC
  Administered 2024-01-19 – 2024-01-22 (×8): 1 via ORAL
  Filled 2024-01-18 (×7): qty 1

## 2024-01-18 MED ORDER — ATORVASTATIN CALCIUM 80 MG PO TABS: 80.0000 mg | ORAL_TABLET | Freq: Every day | ORAL | Status: DC

## 2024-01-18 MED ORDER — INSULIN ASPART 100 UNIT/ML IJ SOLN
6.0000 [IU] | Freq: Three times a day (TID) | INTRAMUSCULAR | Status: DC
  Administered 2024-01-18 – 2024-01-19 (×2): 6 [IU] via SUBCUTANEOUS
  Filled 2024-01-18 (×2): qty 6

## 2024-01-18 MED ORDER — SALINE SPRAY 0.65 % NA SOLN: 1.0000 | Freq: Every day | NASAL | Status: DC | PRN

## 2024-01-18 MED ORDER — LIDOCAINE 5 % EX PTCH
2.0000 | MEDICATED_PATCH | CUTANEOUS | Status: DC
  Administered 2024-01-19 – 2024-02-03 (×14): 2 via TRANSDERMAL
  Filled 2024-01-18 (×12): qty 2

## 2024-01-18 MED ORDER — MILK AND MOLASSES ENEMA: 1.0000 | Freq: Every day | RECTAL | Status: DC | PRN

## 2024-01-18 MED ORDER — MAGIC MOUTHWASH
5.0000 mL | Freq: Four times a day (QID) | ORAL | Status: DC
  Administered 2024-01-18 – 2024-02-03 (×49): 5 mL via ORAL
  Filled 2024-01-18 (×49): qty 5

## 2024-01-18 MED ORDER — GUAIFENESIN-DM 100-10 MG/5ML PO SYRP: 5.0000 mL | ORAL_SOLUTION | Freq: Four times a day (QID) | ORAL | Status: DC | PRN

## 2024-01-18 MED ORDER — PROCHLORPERAZINE 25 MG RE SUPP: 12.5000 mg | Freq: Four times a day (QID) | RECTAL | Status: DC | PRN

## 2024-01-18 MED ORDER — CLOPIDOGREL BISULFATE 75 MG PO TABS: 75.0000 mg | ORAL_TABLET | Freq: Every day | ORAL | Status: DC

## 2024-01-18 MED ORDER — HEPARIN SODIUM (PORCINE) 5000 UNIT/ML IJ SOLN
5000.0000 [IU] | Freq: Three times a day (TID) | INTRAMUSCULAR | Status: DC
  Administered 2024-01-18 – 2024-01-25 (×18): 5000 [IU] via SUBCUTANEOUS
  Filled 2024-01-18 (×18): qty 1

## 2024-01-18 MED ORDER — BISACODYL 10 MG RE SUPP: 10.0000 mg | Freq: Every day | RECTAL | Status: DC | PRN

## 2024-01-18 MED ORDER — CHLORHEXIDINE GLUCONATE CLOTH 2 % EX PADS: 6.0000 | MEDICATED_PAD | Freq: Every day | CUTANEOUS | Status: DC

## 2024-01-18 MED ORDER — INSULIN GLARGINE 100 UNIT/ML ~~LOC~~ SOLN: 25.0000 [IU] | Freq: Two times a day (BID) | SUBCUTANEOUS | Status: DC

## 2024-01-18 MED ORDER — ACETAMINOPHEN 325 MG PO TABS
325.0000 mg | ORAL_TABLET | ORAL | Status: DC | PRN
  Administered 2024-01-22: 650 mg via ORAL
  Filled 2024-01-18 (×2): qty 2

## 2024-01-18 MED ORDER — ASPIRIN 81 MG PO TBEC
81.0000 mg | DELAYED_RELEASE_TABLET | Freq: Every day | ORAL | Status: DC
  Administered 2024-01-19 – 2024-02-03 (×16): 81 mg via ORAL
  Filled 2024-01-18 (×11): qty 1

## 2024-01-18 MED ORDER — CALCIUM CARBONATE ANTACID 500 MG PO CHEW
2.0000 | CHEWABLE_TABLET | Freq: Four times a day (QID) | ORAL | Status: DC | PRN
  Filled 2024-01-18: qty 2

## 2024-01-18 MED ORDER — RENA-VITE PO TABS
1.0000 | ORAL_TABLET | Freq: Every day | ORAL | Status: DC
  Administered 2024-01-18 – 2024-02-02 (×16): 1 via ORAL
  Filled 2024-01-18 (×12): qty 1

## 2024-01-18 MED ORDER — HEPARIN SODIUM (PORCINE) 5000 UNIT/ML IJ SOLN: 5000.0000 [IU] | Freq: Three times a day (TID) | INTRAMUSCULAR | Status: DC

## 2024-01-18 MED ORDER — PROCHLORPERAZINE MALEATE 5 MG PO TABS: 5.0000 mg | ORAL_TABLET | Freq: Four times a day (QID) | ORAL | Status: DC | PRN

## 2024-01-18 MED ORDER — GERHARDT'S BUTT CREAM
TOPICAL_CREAM | Freq: Every day | CUTANEOUS | Status: DC
  Administered 2024-01-23: 1 via TOPICAL
  Filled 2024-01-18: qty 60

## 2024-01-18 MED ORDER — TRAMADOL HCL 50 MG PO TABS: 50.0000 mg | ORAL_TABLET | Freq: Two times a day (BID) | ORAL | Status: DC | PRN

## 2024-01-18 MED ORDER — SENNOSIDES-DOCUSATE SODIUM 8.6-50 MG PO TABS
2.0000 | ORAL_TABLET | Freq: Two times a day (BID) | ORAL | Status: DC
  Administered 2024-01-18 – 2024-02-03 (×28): 2 via ORAL
  Filled 2024-01-18 (×20): qty 2

## 2024-01-18 MED ADMIN — Lidocaine Patch 5%: 2 | TRANSDERMAL | @ 10:00:00 | NDC 00591352511

## 2024-01-18 NOTE — PMR Pre-admission (Signed)
 PMR Admission Coordinator Pre-Admission Assessment  Patient: Timothy Holloway is an 68 y.o., male MRN: [id number] DOB: 23-Aug-1955 Height: 6' (182.9 cm) Weight: 134.2 kg  Insurance Information HMO:     PPO: yes     PCP:      IPA:      80/20:      OTHER:  PRIMARY: [employer]      Policy#: [id number]     Subscriber: pt CM Name: Timothy Holloway      Phone#:[phone]  Fax#: epic access Pre-Cert#: [id number]      auth for CIR from Timothy Holloway with [facility] for admit 12/18 with next review date 01/12/24 and they have EMR access        Employer:  Benefits:  Phone #: [phone]     Name:  Eff. Date: 02/01/23     Deduct: $0      Out of Pocket Max: $3500 (met $150)      Life Max: n/a CIR: $225/day for days 1-6      SNF: 20 full days Outpatient:      Co-Pay: $15/visit Home Health: 80%      Co-Pay: 20% DME: 80%     Co-Pay: 20% Providers:  SECONDARY:       Policy#:      Phone#:   Artist:       Phone#:   The Best Boy for patients in Inpatient Rehabilitation Facilities with attached Privacy Act Statement-Health Care Records was provided and verbally reviewed with: Patient and Family  Emergency Contact Information Contact Information     Name Relation Home Work Mobile   Holloway,Timothy Spouse [phone]      Other Contacts   None on File     Current Medical History  Patient Admitting Diagnosis: CABG/CVA   History of Present Illness: Pt is a 68 y/o male with PMH of ESRD on HD MWF, DM, HTN, depression, CKD, steal syndrome, who presented to [facility] on 12/5 with chest pain, elevated troponin and hyperglycemia.  He was diagnosed with NSTEMI and transferred to [facility] on 12/8 for a CABG x3 per Timothy Holloway. POD 1 he developed R hemiparesis and facial droop with associated hypotension.  Imaging was negative and felt pt hypoperfusing.  Symptoms persisted through 12/10 and MRI showed scattered infarcts throughout the supratentorial  and infratentorial brain primarily in the L cerebral hemisphere.  Consult to vascular and he underwent a TCAR on 12/10 per Dr. Gretta.  Therapy has been ongoing and he was recommended for CIR. Patients medical and functional status supports admission to an inpatient rehabilitation program due to multifactorial deficits, such that continued progress would require a coordinated, team-based approach with physician oversight to achieve meaningful gains in mobility, ADL independence, and safety.   Complete NIHSS TOTAL: 0  Patient's medical record from [facility] and Timothy Holloway has been reviewed by the rehabilitation admission coordinator and physician.  Past Medical History  Past Medical History:  Diagnosis Date   Anemia    Cataract    Chronic kidney disease (CKD), stage III (moderate) (HCC)    dr Timothy Holloway   Depression    Diabetes mellitus without complication (HCC)    DVT (deep venous thrombosis) (HCC)    as a child   ESRD on dialysis (HCC)    M-W-F   Hyperlipidemia    Hypertension    Impotence    Insomnia    Obesity    Steal syndrome as complication of dialysis access  Has the patient had major surgery during 100 days prior to admission? Yes  Family History   family history includes Cancer in his father; Diabetes in his mother.  Current Medications Current Medications[1]  Patients Current Diet:  Diet Order             Diet regular Room service appropriate? Yes with Assist; Fluid consistency: Thin  Diet effective now                   Precautions / Restrictions Precautions Precautions: Fall, Sternal Precaution Booklet Issued: No Precaution/Restrictions Comments: SBP > 100 goal, syncope (delayed ~30 min after sitting up) with nursing 12/13, x2 abdominal binders, TED hose Restrictions Weight Bearing Restrictions Per Provider Order: Yes RUE Weight Bearing Per Provider Order: Non weight bearing LUE Weight Bearing Per Provider Order: Non weight bearing Other  Position/Activity Restrictions: Sternal precautions   Has the patient had 2 or more falls or a fall with injury in the past year? No  Prior Activity Level Limited Community (1-2x/wk): independent prior to admit with no AD, driving himself to HD  Prior Functional Level Self Care: Did the patient need help bathing, dressing, using the toilet or eating? Independent  Indoor Mobility: Did the patient need assistance with walking from room to room (with or without device)? Independent  Stairs: Did the patient need assistance with internal or external stairs (with or without device)? Independent  Functional Cognition: Did the patient need help planning regular tasks such as shopping or remembering to take medications? Independent  Patient Information Are you of Hispanic, Latino/a,or Spanish origin?: A. No, not of Hispanic, Latino/a, or Spanish origin What is your race?: A. White Do you need or want an interpreter to communicate with a doctor or health care staff?: 0. No  Patient's Response To:  Health Literacy and Transportation Is the patient able to respond to health literacy and transportation needs?: Yes Health Literacy - How often do you need to have someone help you when you read instructions, pamphlets, or other written material from your doctor or pharmacy?: Never In the past 12 months, has lack of transportation kept you from medical appointments or from getting medications?: No In the past 12 months, has lack of transportation kept you from meetings, work, or from getting things needed for daily living?: No  Home Assistive Devices / Equipment Home Equipment: Cane - quad  Prior Device Use: Indicate devices/aids used by the patient prior to current illness, exacerbation or injury? None of the above  Current Functional Level Cognition  Orientation Level: Oriented X4    Extremity Assessment (includes Sensation/Coordination)  Upper Extremity Assessment: Generalized weakness   Lower Extremity Assessment: Defer to PT evaluation    ADLs  Overall ADL's : Needs assistance/impaired Eating/Feeding: Set up Grooming: Set up Upper Body Bathing: Minimal assistance Lower Body Bathing: Maximal assistance Upper Body Dressing : Sitting, Moderate assistance Upper Body Dressing Details (indicate cue type and reason): donning gown on backside Lower Body Dressing: Moderate assistance Lower Body Dressing Details (indicate cue type and reason): donning TEDs and socks Toilet Transfer: Minimal assistance, +2 for physical assistance, Ambulation (eva walker) Functional mobility during ADLs: Minimal assistance, +2 for physical assistance General ADL Comments: Limited d/t pt's soft BP with EOB    Mobility  Overal bed mobility: Needs Assistance Bed Mobility: Sit to Sidelying Rolling: Min assist Sidelying to sit: Mod assist, +2 for safety/equipment Sit to sidelying: Min assist General bed mobility comments: Cued pt to lean laterally to L side  while therapist provided minA to lift legs onto bed    Transfers  Overall transfer level: Needs assistance Equipment used: Rolling walker (2 wheels) Transfers: Sit to/from Stand, Bed to chair/wheelchair/BSC Sit to Stand: Mod assist Bed to/from chair/wheelchair/BSC transfer type:: Step pivot Step pivot transfers: Min assist General transfer comment: ModA to gain balance and power up to stand from recliner with pt complying to his sternal precautions by hugging his heart pillow. verbal and tactile cues provided to extend hips and lift chest for more upright posture. MinA needed for balance and safety to step pivot to R from chair to bed    Ambulation / Gait / Stairs / Wheelchair Mobility  Ambulation/Gait Ambulation/Gait assistance: Editor, Commissioning (Feet): 3 Feet Assistive device: Rolling walker (2 wheels) Gait Pattern/deviations: Step-to pattern, Decreased stride length, Wide base of support, Antalgic General Gait Details: Limited  distance due to pt becoming increasingly lightheaded when standing. Pt needed minA to step pivot to R from recliner to bed with RW support. Pt expressed L knee chronic pain/issues with gait Gait velocity: reduced Gait velocity interpretation: <1.31 ft/sec, indicative of household ambulator    Posture / Balance Dynamic Sitting Balance Sitting balance - Comments: static sitting EOB with CGA for safety Balance Overall balance assessment: Needs assistance Sitting-balance support: No upper extremity supported, Feet supported Sitting balance-Leahy Scale: Fair Sitting balance - Comments: static sitting EOB with CGA for safety Standing balance support: Bilateral upper extremity supported, During functional activity, Reliant on assistive device for balance Standing balance-Leahy Scale: Poor Standing balance comment: reliant on RW and minA    Special considerations/life events  Dialysis: Hemodialysis Monday, Wednesday, and Friday, Skin surgical incision to chest, groin, L neck, pressure injury coccyx medial stage 2, and perineal wound , and Diabetic management yes   Previous Home Environment (from acute therapy documentation) Living Arrangements: Spouse/significant other Available Help at Discharge: Family, Available PRN/intermittently Type of Home: House Home Layout: One level Home Access: Stairs to enter Entrance Stairs-Rails: None Entrance Stairs-Number of Steps: 1 Bathroom Shower/Tub: Engineer, Manufacturing Systems: Handicapped height Home Care Services: No  Discharge Living Setting Plans for Discharge Living Setting: Patient's home, Lives with (comment) (spouse) Type of Home at Discharge: House Discharge Home Layout: One level Discharge Home Access: Stairs to enter Entrance Stairs-Rails: None Entrance Stairs-Number of Steps: 1 Discharge Bathroom Shower/Tub: Tub/shower unit Discharge Bathroom Toilet: Handicapped height Discharge Bathroom Accessibility: Yes How Accessible: Accessible  via walker Does the patient have any problems obtaining your medications?: No  Social/Family/Support Systems Patient Roles: Spouse Anticipated Caregiver: Audiological Scientist Anticipated Industrial/product Designer Information: [phone] Ability/Limitations of Caregiver: supervision Caregiver Availability: 24/7 Discharge Plan Discussed with Primary Caregiver: Yes Is Caregiver In Agreement with Plan?: Yes  Goals Patient/Family Goal for Rehab: PT/OT supervision, SLP n/a Expected length of stay: 14-17 days Additional Information: Discharge plan: home with spouse who can provide 24/7 supervision Pt/Family Agrees to Admission and willing to participate: Yes Program Orientation Provided & Reviewed with Pt/Caregiver Including Roles  & Responsibilities: Yes  Decrease burden of Care through IP rehab admission: n/a  Possible need for SNF placement upon discharge: Not anticipated.  Plan for discharge home with 24/7 supervision from spouse.   Patient Condition: I have reviewed medical records from [facility], spoken with TOC, and patient and spouse. I met with patient at the bedside and discussed via phone for inpatient rehabilitation assessment.  Patient will benefit from ongoing PT and OT, can actively participate in 3 hours of therapy a day 5 days  of the week, and can make measurable gains during the admission.  Patient will also benefit from the coordinated team approach during an Inpatient Acute Rehabilitation admission.  The patient will receive intensive therapy as well as Rehabilitation physician, nursing, social worker, and care management interventions.  Due to safety, skin/wound care, disease management, medication administration, pain management, and patient education the patient requires 24 hour a day rehabilitation nursing.  The patient is currently min to mod assist with mobility and basic ADLs.  Discharge setting and therapy post discharge at home with home health is anticipated.  Patient has  agreed to participate in the Acute Inpatient Rehabilitation Program and will admit today.  Preadmission Screen Completed By:  Reche FORBES Lowers, PT, DPT 01/18/2024 10:01 AM ______________________________________________________________________   Discussed status with Dr. Humphrey Guerreiro on 01/18/2024  at 1:40 PM  and received approval for admission today.  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 Yes Co-Morbidities requiring supervision/potential complications: TCAR 12/10; CABG 12/8- due ot NSTEMI; ESRD on HD, DM A1c 9.9  Due to bladder management, bowel management, safety, skin/wound care, disease management, medication administration, pain management, and patient education, does the patient require 24 hr/day rehab nursing? Yes Does the patient require coordinated care of a physician, rehab nurse, PT, OT,  to address physical and functional deficits in the context of the above medical diagnosis(es)? Yes Addressing deficits in the following areas: balance, endurance, locomotion, strength, transferring, bowel/bladder control, bathing, dressing, feeding, grooming, and toileting Can the patient actively participate in an intensive therapy program of at least 3 hrs of therapy 5 days a week? Yes The potential for patient to make measurable gains while on inpatient rehab is good Anticipated functional outcomes upon discharge from inpatient rehab: supervision PT, supervision OT, n/a SLP Estimated rehab length of stay to reach the above functional goals is: 14-17 days Anticipated discharge destination: Home 10. Overall Rehab/Functional Prognosis: good   MD Signature:      [1]  Current Facility-Administered  Medications:    albuterol  (PROVENTIL ) (2.5 MG/3ML) 0.083% nebulizer solution 2.5 mg, 2.5 mg, Nebulization, Q4H PRN, Schuh, McKenzi P, PA-C   aspirin  EC tablet 81 mg, 81 mg, Oral, Daily, Lightfoot, Harrell O, MD, 81 mg at 01/17/24 1324   atorvastatin  (LIPITOR) tablet 80 mg, 80 mg, Oral, Daily, Lightfoot, Harrell O, MD, 80 mg at 01/17/24 1325   bisacodyl  (DULCOLAX) EC tablet 10 mg, 10 mg, Oral, Daily, 10 mg at 01/17/24 1325 **OR** bisacodyl  (DULCOLAX) suppository 10 mg, 10 mg, Rectal, Daily, Schuh, McKenzi P, PA-C   Chlorhexidine  Gluconate Cloth 2 % PADS 6 each, 6 each, Topical, Q0600, Geralynn Charleston, MD, 6 each at 01/17/24 0549   Chlorhexidine  Gluconate Cloth 2 % PADS 6 each, 6 each, Topical, Q0600, Geralynn Charleston, MD, 6 each at 01/18/24 [phone]   clopidogrel  (PLAVIX ) tablet 75 mg, 75 mg, Oral, Daily, Lightfoot, Harrell O, MD, 75 mg at 01/17/24 1325   Darbepoetin Alfa  (ARANESP ) injection 100 mcg, 100 mcg, Subcutaneous, Q Thu-1800, Geralynn Charleston, MD, 100 mcg at 01/11/24 2100   Gerhardt's butt cream, , Topical, Daily, Timothy Holloway Leita SQUIBB, DO, Given at 01/17/24 1326   heparin  injection 5,000 Units, 5,000 Units, Subcutaneous, Q8H, Schuh, McKenzi P, PA-C, 5,000 Units at 01/18/24 [phone]  CBG monitoring, , , 4x Daily, AC & HS **AND** insulin  aspart (novoLOG ) injection 0-24 Units, 0-24 Units, Subcutaneous, TID AC & HS, Lightfoot, Linnie KIDD, MD, 2 Units at 01/18/24 9352   insulin  aspart (novoLOG ) injection 5 Units, 5 Units, Subcutaneous, TID WC, Claudene Toribio BROCKS, MD, 5 Units at 01/18/24 9173   insulin  glargine (LANTUS ) injection 25 Units, 25 Units, Subcutaneous, BID, Claudene Toribio BROCKS, MD, 25 Units at 01/17/24 2119   lidocaine  (LIDODERM ) 5 % 2 patch, 2 patch, Transdermal, Q24H, Lightfoot, Linnie KIDD, MD, 2 patch at 01/16/24 0900   midodrine  (PROAMATINE ) tablet 5 mg, 5 mg, Oral, Q8H PRN, Claudene Toribio BROCKS, MD   multivitamin (RENA-VIT) tablet 1 tablet, 1 tablet, Oral, QHS, Timothy Holloway Leita SQUIBB, DO, 1 tablet at 01/17/24 2121    ondansetron  (ZOFRAN ) injection 4 mg, 4 mg, Intravenous, Q6H PRN, Schuh, McKenzi P, PA-C   Oral care mouth rinse, 15 mL, Mouth Rinse, PRN, Lightfoot, Linnie KIDD, MD   oxyCODONE  (Oxy IR/ROXICODONE ) immediate release tablet 2.5 mg, 2.5 mg, Oral, Q3H PRN, Lightfoot, Harrell O, MD, 2.5 mg at 01/17/24 2118   pantoprazole  (PROTONIX ) EC tablet 40 mg, 40 mg, Oral, Daily, Lightfoot, Harrell O, MD, 40 mg at 01/17/24 1324   sodium chloride  flush (NS) 0.9 % injection 10-40 mL, 10-40 mL, Intracatheter, Q12H, Schuh, McKenzi P, PA-C, 10 mL at 01/17/24 2122   sodium chloride  flush (NS) 0.9 % injection 10-40 mL, 10-40 mL, Intracatheter, PRN, Schuh, McKenzi P, PA-C   sodium chloride  flush (NS) 0.9 % injection 3 mL, 3 mL, Intravenous, Q12H, Lightfoot, Harrell O, MD, 3 mL at 01/17/24 2122   sodium chloride  flush (NS) 0.9 % injection 3 mL, 3 mL, Intravenous, PRN, Lightfoot, Harrell O, MD, 3 mL at 01/17/24 2122   traMADol  (ULTRAM ) tablet 50 mg, 50 mg, Oral, Q12H PRN, Lightfoot, Harrell O, MD, 50 mg at 01/15/24 2253   zolpidem  (AMBIEN ) tablet 5 mg, 5 mg, Oral, QHS PRN, Schuh, McKenzi P, PA-C, 5 mg at 01/15/24 2253

## 2024-01-18 NOTE — Progress Notes (Signed)
 Report given to receiving RN at CIR 39M.  Will leave PIV in place per our discussion.

## 2024-01-18 NOTE — Progress Notes (Signed)
 Mobility Specialist Progress Note;   01/18/24 0910  Mobility  Activity Pivoted/transferred from chair to bed  Level of Assistance Minimal assist, patient does 75% or more  Assistive Device None  Distance Ambulated (ft) 5 ft  RUE Weight Bearing Per Provider Order NWB  LUE Weight Bearing Per Provider Order NWB  Activity Response Tolerated well  Mobility Referral Yes  Mobility visit 1 Mobility  Mobility Specialist Start Time (ACUTE ONLY) 0910  Mobility Specialist Stop Time (ACUTE ONLY) [id number]  Mobility Specialist Time Calculation (min) (ACUTE ONLY) 8 min   NT requesting assistance transferring pt back to bed. Required MinA+2 to safely transfer from chair to bed. No c/o dizziness throughout mobility. Able to adhere to sternal precautions well. Pt left comfortably in bed with all needs met, alarm on.   Lauraine Erm Mobility Specialist Please contact via SecureChat or [employer] [phone]

## 2024-01-18 NOTE — TOC Transition Note (Signed)
 Transition of Care (TOC) - Discharge Note Rayfield Gobble RN, BSN Inpatient Care Management Unit 4E- RN Case Manager See Treatment Team for direct phone #   Patient Details  Name: Timothy Holloway MRN: [id number] Date of Birth: [birth year]  [name]) CM/SW Contact:  Gobble Rayfield Hurst, RN Phone Number: 01/18/2024, 3:20 PM   Clinical Narrative:    CM notified by Davene CALKINS rehab liaison that pt has received insurance auth and has bed available for admit today.   TCTS team has cleared pt for transition to [facility] INPT rehab. Pt will transfer to CIR once bed has been assigned.   [facility] liaison has been following with TCTS office referral- liaison updated and will follow for any HH needs when ready for discharge from CIR.   No further IP CM needs noted.    Final next level of care: IP Rehab Facility Barriers to Discharge: Barriers Resolved   Patient Goals and CMS Choice Patient states their goals for this hospitalization and ongoing recovery are:: rehab then return home to recover   Choice offered to / list presented to : Patient, Spouse      Discharge Placement               [facility] INPT rehab        Discharge Plan and Services Additional resources added to the After Visit Summary for     Discharge Planning Services: CM Consult Post Acute Care Choice: IP Rehab          DME Arranged: N/A DME Agency: NA       HH Arranged: NA HH Agency: NA        Social Drivers of Health (SDOH) Interventions SDOH Screenings   Food Insecurity: No Food Insecurity (01/06/2024)  Housing: Low Risk (01/05/2024)  Transportation Needs: No Transportation Needs (01/05/2024)  Utilities: Not At Risk (01/05/2024)  Financial Resource Strain: Low Risk (09/14/2023)   Received from [facility]  Social Connections: Unknown (01/06/2024)  Tobacco Use: Unknown (01/08/2024)     Readmission Risk Interventions    01/18/2024    3:20 PM  Readmission Risk Prevention Plan   Transportation Screening Complete  PCP or Specialist Appt within 3-5 Days --  HRI or Home Care Consult Complete  Social Work Consult for Recovery Care Planning/Counseling Complete  Palliative Care Screening Not Applicable  Medication Review Oceanographer) Complete

## 2024-01-18 NOTE — H&P (Signed)
 Physical Medicine and Rehabilitation Admission H&P        Chief Complaint  Patient presents with   Functional deficits due to debility/CVA      HPI:  Timothy Holloway is a 68 year old R handed male with history of T2DM, ESRD--MWF, vertigo,  HTN who was admitted from his HD center with chest pain and SOB. He reported few day history of CP and was found to have pulmonary edema as well as elevated troponin and transferred to [facility] for management.  Patient with NSTEMI and underwent cardiac cath by Dr. Jordan revealing severe 3 V CAD with EF 30-35%.  Dr. Shyrl was consulted and felt that revascularization was the best option. He underwent CABG X 4 on 01/08/24. Post op extubated without difficulty but developed sudden on set of right sided weakness with aphasia on 12/09 am. . CT head negative. 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   He had recurrent issues later that day due to hypotension and  Dr. Voncile recommended permissive HTN.  He had recurrent symptoms with lethargy, left gaze preference and severe dysarthria on 12/10.      Carotid dopplers done revealing 80-99% L-ICA stenosis and Dr. Gretta recommended TCAR for management of symptomatic L-CAS. He underwent L-TCAR on 12/10  and started on liquid diet due to moderate dysphagia noted on FEES. Dr. Jerri recommended Plavix  for stroke due to large vessel disease and hoarseness felt to be due to TCAR v/s re-intubation. MRI brain done revealing numerous small acute infarcts scattered throughout supratentorial and infratentorial brain, greatest involvement in left cerebral hemisphere.   He continued to have orthostatic symptoms with syncopal episode on 12/13.  Required CRRT and has been transitioned to HD as BP improved and midodrine  d/c.  He continues to have orthostatic symptoms with activity. TEDs and binder X 2 used for BP support.  PT/OT has been  working with patient who requires min to max assist with ADLs and min to mod assist for transfers and able to take few steps. He continues to be limited by weakness and sternal precautions. He was as independent PTA and CIR recommended due to functional decline.    Pt reports was taking something for chest soreness- thinks was taking Oxy 2.5 mg, but wife says that makes him confused.    Stil forms urine- 2-3x/day- 100-150cc.  Doesn't remember being told he had mini strokes, but feels his mentation is back to normal- his wife and sister in law agrees.    LBM 5 days ago- usually goes q1-3 days, but denies feeling really constipated.   Low energy, feels so weak when gets up- still having Low BP and Orthostatic hypotension when he stands with therapy.    Has a hoarse voice and feeling like has a lot of post nasal drip.    Review of Systems  Constitutional:  Positive for malaise/fatigue (for past 2 months). Negative for fever.  HENT:  Positive for congestion. Negative for hearing loss.        Sore mouth.hoarse voice and sinus drainage  Eyes:  Negative for blurred vision.  Respiratory:  Negative for cough, shortness of breath and wheezing.   Cardiovascular:  Positive for leg swelling. Negative for chest pain.  Gastrointestinal:  Positive for constipation (no BM in 5 days). Negative for abdominal pain.  Genitourinary:        Still produces urine ~  100-150cc 2-3x/day  Musculoskeletal:  Positive for back pain (chronic).       Chest soreness  Neurological:  Positive for sensory change (BLE from knees down) and weakness. Negative for dizziness and headaches.  Psychiatric/Behavioral:  Negative for memory loss. The patient has insomnia.   All other systems reviewed and are negative.        Past Medical History:  Diagnosis Date   Anemia     Cataract     Chronic kidney disease (CKD), stage III (moderate) (HCC)      dr Faythe   Depression     Diabetes mellitus without complication (HCC)     DVT  (deep venous thrombosis) (HCC)      as a child   ESRD on dialysis [facility])      M-W-F   Hyperlipidemia     Hypertension     Impotence     Insomnia     Obesity     Steal syndrome as complication of dialysis access                 Past Surgical History:  Procedure Laterality Date   A/V FISTULAGRAM Right 10/11/2022    Procedure: A/V Fistulagram;  Surgeon: Serene Gaile ORN, MD;  Location: [facility] INVASIVE CV LAB;  Service: Cardiovascular;  Laterality: Right;   AV FISTULA PLACEMENT Left 01/11/2022    Procedure: LEFT ARM ARTERIOVENOUS (AV) FISTULA CREATION;  Surgeon: Lanis Fonda BRAVO, MD;  Location: [facility] OR;  Service: Vascular;  Laterality: Left;  PERIPHERAL NERVE BLOCK   AV FISTULA PLACEMENT Right 07/26/2022    Procedure: RIGHT UPPER EXTREMITY ARTERIOVENOUS (AV) FISTULA CREATION;  Surgeon: Oris Krystal FALCON, MD;  Location: [facility] ORS;  Service: Vascular;  Laterality: Right;   CORONARY ARTERY BYPASS GRAFT N/A 01/08/2024    Procedure: CORONARY ARTERY BYPASS GRAFTING (CABG) TIMES 3 USING LEFT INTERNAL MAMMARY ARTERY AND ENDOSCOPICALLY HARVESTED RIGHT GREATER SAPHENOUS VEIN;  Surgeon: Shyrl Linnie KIDD, MD;  Location: [facility] OR;  Service: Open Heart Surgery;  Laterality: N/A;  second case   DIALYSIS/PERMA CATHETER INSERTION Right 10/23/2023    Procedure: DIALYSIS/PERMA CATHETER INSERTION;  Surgeon: Magda Debby SAILOR, MD;  Location: [facility] LAB;  Service: Cardiovascular;  Laterality: Right;   INSERTION OF DIALYSIS CATHETER Right 06/16/2022    Procedure: INSERTION OF RIGHT iNTERNAL Jugular TUNNELED DIALYSIS CATHETER;  Surgeon: Serene Gaile ORN, MD;  Location: [facility] OR;  Service: Vascular;  Laterality: Right;   KNEE SURGERY Left 01-31-2002   LEFT HEART CATH AND CORONARY ANGIOGRAPHY N/A 01/05/2024    Procedure: LEFT HEART CATH AND CORONARY ANGIOGRAPHY;  Surgeon: Jordan, Peter M, MD;  Location: [facility] INVASIVE CV LAB;  Service: Cardiovascular;  Laterality: N/A;   LIGATION OF ARTERIOVENOUS  FISTULA Left 06/16/2022    Procedure: LEFT  ARM FISTULA LIGATION;  Surgeon: Serene Gaile ORN, MD;  Location: [facility] OR;  Service: Vascular;  Laterality: Left;   LIGATION OF ARTERIOVENOUS  FISTULA Right 11/08/2022    Procedure: LIGATION of RIGHT RADIOCEPHALIC FISTULA;  Surgeon: Sheree Penne Bruckner, MD;  Location: [facility] OR;  Service: Vascular;  Laterality: Right;   SPINE SURGERY        fusion of L4-L5   TEE WITHOUT CARDIOVERSION N/A 01/08/2024    Procedure: ECHOCARDIOGRAM, TRANSESOPHAGEAL;  Surgeon: Shyrl Linnie KIDD, MD;  Location: [facility] OR;  Service: Open Heart Surgery;  Laterality: N/A;   TRANSCAROTID ARTERY REVASCULARIZATION  Left 01/10/2024    Procedure: TRANSCAROTID ARTERY REVASCULARIZATION (TCAR);  Surgeon: Gretta Bruckner PARAS, MD;  Location: [facility] OR;  Service:  Vascular;  Laterality: Left;   UPPER EXTREMITY ANGIOGRAPHY Right 11/03/2022    Procedure: Upper Extremity Angiography;  Surgeon: Gretta Lonni PARAS, MD;  Location: [facility] INVASIVE CV LAB;  Service: Cardiovascular;  Laterality: Right;               Family History  Problem Relation Age of Onset   Diabetes Mother     Cancer Father          lymphoma          Social History:  Married. Independent and was working till a year and half ago.  Has been on dialysis for 2 years. He  reports that he has never smoked. He has never been exposed to tobacco smoke. He does not have any smokeless tobacco history on file. He reports that he does not drink alcohol and does not use drugs.     [Allergies]   [Allergies]     Allergen Reactions   Latex Hives   Lodine [Etodolac] Nausea And Vomiting    Latex Hives   Lodine [Etodolac] Nausea And Vomiting                 Facility-Administered Medications Prior to Admission  Medication Dose Route Frequency Provider Last Rate Last Admin   0.9 %  sodium chloride  infusion  250 mL Intravenous PRN Serene Gaile ORN, MD       0.9 %  sodium chloride  infusion  250 mL Intravenous PRN Gretta Lonni PARAS, MD       sodium chloride  flush (NS) 0.9 %  injection 3 mL  3 mL Intravenous Q12H Serene Gaile ORN, MD       sodium chloride  flush (NS) 0.9 % injection 3 mL  3 mL Intravenous PRN Serene Gaile ORN, MD       sodium chloride  flush (NS) 0.9 % injection 3 mL  3 mL Intravenous Q12H Gretta Lonni PARAS, MD                Medications Prior to Admission  Medication Sig Dispense Refill   acetaminophen  (TYLENOL ) 325 MG tablet Take 650 mg by mouth every 6 (six) hours as needed for moderate pain.       albuterol  (VENTOLIN  HFA) 108 (90 Base) MCG/ACT inhaler Inhale 2 puffs into the lungs every 6 (six) hours as needed for wheezing.       aspirin  81 MG tablet Take 81 mg by mouth daily.       atorvastatin  (LIPITOR) 10 MG tablet Take 1 tablet (10 mg total) by mouth daily. (Patient taking differently: Take 10 mg by mouth at bedtime.) 30 tablet 11   carvedilol  (COREG ) 6.25 MG tablet Take 6.25 mg by mouth 2 (two) times daily with a meal.       Cholecalciferol (VITAMIN D) 2000 UNITS CAPS Take 2,000 Units by mouth at bedtime.       cloNIDine  (CATAPRES ) 0.1 MG tablet Take 0.1 mg by mouth 2 (two) times daily.       diclofenac Sodium (VOLTAREN) 1 % GEL Apply 2 g topically daily as needed (pain).       EPINEPHrine  0.3 mg/0.3 mL IJ SOAJ injection Inject 0.3 mg into the muscle as needed for anaphylaxis.       fenofibrate  (TRICOR ) 145 MG tablet Take 1/2 tablet daily (Patient taking differently: Take 72.5 mg by mouth every morning.) 30 tablet 0   furosemide  (LASIX ) 80 MG tablet Take 80 mg by mouth daily.       Glucosamine-Chondroit-Vit C-Mn (GLUCOSAMINE  CHONDR 1500 COMPLX) CAPS Take 1 capsule by mouth 2 (two) times daily.       HUMALOG KWIKPEN 200 UNIT/ML KwikPen Inject 10-20 Units into the skin with breakfast, with lunch, and with evening meal. Sliding scale       hydrALAZINE  (APRESOLINE ) 100 MG tablet Take 100 mg by mouth 2 (two) times daily.       hydrochlorothiazide  (HYDRODIURIL ) 25 MG tablet Take 25 mg by mouth daily.       isosorbide  mononitrate (IMDUR ) 30 MG  24 hr tablet Take 30 mg by mouth at bedtime.       LANTUS  SOLOSTAR 100 UNIT/ML Solostar Pen Inject 50 Units into the skin at bedtime.       losartan  (COZAAR ) 100 MG tablet Take 100 mg by mouth at bedtime.       meclizine (ANTIVERT) 25 MG tablet Take 25 mg by mouth daily as needed for dizziness.       methocarbamol (ROBAXIN) 500 MG tablet Take 1 tablet by mouth daily as needed for muscle spasms.       Multiple Vitamin (MULTIVITAMIN) tablet Take 1 tablet by mouth daily.       sertraline (ZOLOFT) 50 MG tablet Take 50 mg by mouth daily as needed (mood).       sodium chloride  (OCEAN) 0.65 % SOLN nasal spray Place 1 spray into both nostrils daily as needed for congestion.       sucroferric oxyhydroxide (VELPHORO ) 500 MG chewable tablet Chew 500 mg by mouth 3 (three) times daily with meals.       valACYclovir (VALTREX) 1000 MG tablet Take 1,000 mg by mouth 2 (two) times daily.       zolpidem  (AMBIEN ) 10 MG tablet Take 1 tablet (10 mg total) by mouth at bedtime as needed for sleep. 30 tablet 2            Home: Home Living Family/patient expects to be discharged to:: Private residence Living Arrangements: Spouse/significant other Available Help at Discharge: Family, Available PRN/intermittently Type of Home: House Home Access: Stairs to enter Secretary/administrator of Steps: 1 Entrance Stairs-Rails: None Home Layout: One level Bathroom Shower/Tub: Engineer, Manufacturing Systems: Handicapped height Home Equipment: Cane - quad   Functional History: Prior Function Prior Level of Function : Independent/Modified Independent Mobility Comments: Ind, no AD, 1x fall in last 6 months ADLs Comments: Ind   Functional Status:  Mobility: Bed Mobility Overal bed mobility: Needs Assistance Bed Mobility: Rolling, Sidelying to Sit Rolling: Supervision Sidelying to sit: Mod assist Sit to sidelying: Min assist General bed mobility comments: MODA to elevate trunk into sitting from  sidelying Transfers Overall transfer level: Needs assistance Equipment used: Rolling walker (2 wheels) Transfers: Sit to/from Stand, Bed to chair/wheelchair/BSC Sit to Stand: Min assist Bed to/from chair/wheelchair/BSC transfer type:: Step pivot Step pivot transfers: Min assist General transfer comment: pt needed intital MODA to gain balance from initial sit>stand, use of momentum required. pt needed MIN A to pivot to recliner with use of Rw, assist needed for balance and to manage RW, cues needed for RW mgmt and to keep hands on knees when descending into chair Ambulation/Gait Ambulation/Gait assistance: Min assist Gait Distance (Feet): 3 Feet Assistive device: Rolling walker (2 wheels) Gait Pattern/deviations: Step-to pattern, Decreased stride length, Wide base of support, Antalgic General Gait Details: Limited distance due to pt becoming increasingly lightheaded when standing. Pt needed minA to step pivot to R from recliner to bed with RW support. Pt expressed L knee chronic  pain/issues with gait Gait velocity: reduced Gait velocity interpretation: <1.31 ft/sec, indicative of household ambulator   ADL: ADL Overall ADL's : Needs assistance/impaired Eating/Feeding: Set up Grooming: Set up Upper Body Bathing: Minimal assistance Lower Body Bathing: Maximal assistance Upper Body Dressing : Sitting, Moderate assistance Upper Body Dressing Details (indicate cue type and reason): donning gown on backside Lower Body Dressing: Maximal assistance, Sit to/from stand Lower Body Dressing Details (indicate cue type and reason): donning TEDs and socks Toilet Transfer: Minimal assistance, Stand-pivot Toilet Transfer Details (indicate cue type and reason): simulated via stand pivot with Rw to recliner Functional mobility during ADLs: Minimal assistance, Cueing for safety, Cueing for sequencing, Rolling walker (2 wheels) General ADL Comments: ADL participation limited by impaired balance, sternal  precautions and generalized weakness   Cognition: Cognition Orientation Level: Oriented X4 Cognition Arousal: Alert Behavior During Therapy: WFL for tasks assessed/performed, Flat affect   Physical Exam: Blood pressure (!) 98/36, pulse 77, temperature 98.1 F (36.7 C), temperature source Oral, resp. rate 20, height 6' (1.829 m), weight 134.2 kg, SpO2 98%. Physical Exam Vitals and nursing note reviewed. Exam conducted with a chaperone present.  Constitutional:      Appearance: He is well-developed. He is obese.     Comments: Sitting up in bed; sister in law and wife at bedside, awake, alert, appropriate, quiet, interactive, NAD  HENT:     Head: Normocephalic and atraumatic.     Comments: No facial droop Facial sensation intact B/L Tongue midline    Right Ear: External ear normal.     Left Ear: External ear normal.     Nose: Nose normal. No congestion.     Mouth/Throat:     Mouth: Mucous membranes are dry.     Pharynx: No oropharyngeal exudate.  Eyes:     General:        Right eye: No discharge.        Left eye: No discharge.     Extraocular Movements: Extraocular movements intact.  Neck:     Comments: L anterior/side of neck with incision glued Cardiovascular:     Rate and Rhythm: Normal rate and regular rhythm.     Heart sounds: Normal heart sounds. No murmur heard.    No gallop.  Pulmonary:     Effort: Pulmonary effort is normal. No respiratory distress.     Breath sounds: No wheezing, rhonchi or rales.     Comments: Decreased slightly at bases B/L Hoarse voice Chest:     Comments: Midline chest incision with mild erythema and linear scabbing along incision line. Right calf incision C/D/I  Abdominal:     Palpations: Abdomen is soft.     Tenderness: There is no abdominal tenderness.     Comments: Multiple ecchymotic area on lower abdominal wall Protuberant due to weight hard to assess if he's distended;' hypoactive BS  Genitourinary:    Comments: Light amber urine  in urinal- ~ 125cc Musculoskeletal:        General: Normal range of motion.     Cervical back: Neck supple.     Comments: Ue's 5/5 throughout B/L- checked Deltoids, Biceps, triceps, WE, grip and FA B/L LE's HF 4+/5; otherwise 5-/5 in KE/KF/DF and PF B?L  Skin:    General: Skin is warm and dry.     Comments: Chest sternal incision- glued and look good until dried blood- black in scab- that's thick on base of incision- scant drainage Trace LE edema to distal calves B/L IV R wrist/forearm- Looks  OK R inner knee 1 inch incision glued- looks good B/L UEs bruising from IV's and blood draws, and maybe BP cuff   Neurological:     Mental Status: He is alert.     Comments: Ox3- [facility] at [facility] and why here except didn't remember the strokes Knew December 2025- and next holiday was xmas eve Intact to light touch in all 4 extremities except form knees down in stocking pattern due to neuropathy B/L CN's OK per HENT Naming intact 3/3; and repetition intact No increased tone (-) clonus/hoffman's   Psychiatric:     Comments: A little flat, quiet       Lab Results Last 48 Hours        Results for orders placed or performed during the hospital encounter of 01/05/24 (from the past 48 hours)  Glucose, capillary     Status: Abnormal    Collection Time: 01/16/24  5:41 PM  Result Value Ref Range    Glucose-Capillary 210 (H) 70 - 99 mg/dL      Comment: Glucose reference range applies only to samples taken after fasting for at least 8 hours.  Glucose, capillary     Status: Abnormal    Collection Time: 01/16/24  8:11 PM  Result Value Ref Range    Glucose-Capillary 229 (H) 70 - 99 mg/dL      Comment: Glucose reference range applies only to samples taken after fasting for at least 8 hours.  Glucose, capillary     Status: Abnormal    Collection Time: 01/16/24  9:04 PM  Result Value Ref Range    Glucose-Capillary 203 (H) 70 - 99 mg/dL      Comment: Glucose reference range applies only to  samples taken after fasting for at least 8 hours.  Glucose, capillary     Status: Abnormal    Collection Time: 01/16/24 11:20 PM  Result Value Ref Range    Glucose-Capillary 166 (H) 70 - 99 mg/dL      Comment: Glucose reference range applies only to samples taken after fasting for at least 8 hours.  Glucose, capillary     Status: Abnormal    Collection Time: 01/17/24  3:27 AM  Result Value Ref Range    Glucose-Capillary 119 (H) 70 - 99 mg/dL      Comment: Glucose reference range applies only to samples taken after fasting for at least 8 hours.  CBC     Status: Abnormal    Collection Time: 01/17/24  3:51 AM  Result Value Ref Range    WBC 15.4 (H) 4.0 - 10.5 K/uL    RBC 2.46 (L) 4.22 - 5.81 MIL/uL    Hemoglobin 7.6 (L) 13.0 - 17.0 g/dL    HCT 76.2 (L) 60.9 - 52.0 %    MCV 96.3 80.0 - 100.0 fL    MCH 30.9 26.0 - 34.0 pg    MCHC 32.1 30.0 - 36.0 g/dL    RDW 83.5 (H) 88.4 - 15.5 %    Platelets 323 150 - 400 K/uL    nRBC 0.0 0.0 - 0.2 %      Comment: Performed at [facility], [address] [place], [place] [address]  Basic metabolic panel     Status: Abnormal    Collection Time: 01/17/24  3:51 AM  Result Value Ref Range    Sodium 133 (L) 135 - 145 mmol/L    Potassium 4.9 3.5 - 5.1 mmol/L    Chloride 95 (L) 98 -  111 mmol/L    CO2 25 22 - 32 mmol/L    Glucose, Bld 125 (H) 70 - 99 mg/dL      Comment: Glucose reference range applies only to samples taken after fasting for at least 8 hours.    BUN 74 (H) 8 - 23 mg/dL    Creatinine, Ser 4.10 (H) 0.61 - 1.24 mg/dL    Calcium  8.9 8.9 - 10.3 mg/dL    GFR, Estimated 10 (L) >60 mL/min      Comment: (NOTE) Calculated using the CKD-EPI Creatinine Equation (2021)      Anion gap 13 5 - 15      Comment: Performed at [facility], [address] [place], [place] [address]  Glucose, capillary     Status: Abnormal    Collection Time: 01/17/24  6:08 AM  Result Value Ref Range    Glucose-Capillary 128 (H) 70 - 99 mg/dL       Comment: Glucose reference range applies only to samples taken after fasting for at least 8 hours.  Glucose, capillary     Status: Abnormal    Collection Time: 01/17/24  1:05 PM  Result Value Ref Range    Glucose-Capillary 141 (H) 70 - 99 mg/dL      Comment: Glucose reference range applies only to samples taken after fasting for at least 8 hours.  Glucose, capillary     Status: Abnormal    Collection Time: 01/17/24  5:13 PM  Result Value Ref Range    Glucose-Capillary 192 (H) 70 - 99 mg/dL      Comment: Glucose reference range applies only to samples taken after fasting for at least 8 hours.  Glucose, capillary     Status: Abnormal    Collection Time: 01/17/24  8:51 PM  Result Value Ref Range    Glucose-Capillary 172 (H) 70 - 99 mg/dL      Comment: Glucose reference range applies only to samples taken after fasting for at least 8 hours.    Comment 1 Notify RN      Comment 2 Document in Chart    Glucose, capillary     Status: Abnormal    Collection Time: 01/18/24  6:14 AM  Result Value Ref Range    Glucose-Capillary 159 (H) 70 - 99 mg/dL      Comment: Glucose reference range applies only to samples taken after fasting for at least 8 hours.  Glucose, capillary     Status: Abnormal    Collection Time: 01/18/24  8:04 AM  Result Value Ref Range    Glucose-Capillary 141 (H) 70 - 99 mg/dL      Comment: Glucose reference range applies only to samples taken after fasting for at least 8 hours.  Glucose, capillary     Status: Abnormal    Collection Time: 01/18/24 12:19 PM  Result Value Ref Range    Glucose-Capillary 196 (H) 70 - 99 mg/dL      Comment: Glucose reference range applies only to samples taken after fasting for at least 8 hours.      Imaging Results (Last 48 hours)  No results found.         Blood pressure (!) 98/36, pulse 77, temperature 98.1 F (36.7 C), temperature source Oral, resp. rate 20, height 6' (1.829 m), weight 134.2 kg, SpO2 98%.   Medical Problem List and  Plan: 1. 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  Antithrombotics: -DVT/anticoagulation:  Pharmaceutical: Heparin              -antiplatelet therapy: Plavix  and ASA. 3. Pain Management: Low dose oxycodone  for severe pain and tramadol  for moderate pain.              --Continue lidocaine  patch to chest wall for local measures 4. Mood/Behavior/Sleep: LCSW to follow for evaluation and support.              --Will schedule Ambien  5 mg (used 10 mg PTA) for acute on chronic insomnia- due to age, cannot do 10 mg unless Ok'd by pharmacy.              -antipsychotic agents: N/A 5. Neuropsych/cognition: This patient is capable of making decisions on his own behalf. 6. Skin/Wound Care: Monitor incisions for healing--paint with betadine bid.              --Gerhardt's and foam dressing to partial thickness sacral wound. Juven added to promote wound healing.  7. Fluids/Electrolytes/Nutrition: Strict I/O. 1200 cc Fr added --Labs with HD.  8. Significant Orthostatic hypotension Impacting therapy: TEDs/Binder as continues to have BP drop but asymptomatic per OT notes             --Sitting 113/33-->stand 130/42--> 98/33 after sitting 30 mins.              --also has Hx of vertigo. -has Midodrine  prn- suggest using it - also suggest TEDs and maybe ACE wraps on LE's 9. 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  10.  T2DM: Hgb A1c- 9.9. Monitor BS ac/hs and use SSI for elevated BS. Add CM restrictions to diet. Used Lantus  50 units PTA             --On Lantus  25 units BID with 5 units TID ac for  meal coverage.  11. ESRD: Off CRRT and back on MWF HD schedule. Midodrine  prn low BP --schedule therapy at the end of the day to help with tolerance of therapy.  12.  Acute on chronic anemia: Hgb now in 7 range. On Epo 13.  Leucocytosis: Has had leucocytosis which continues to fluctuate             --12.2 at admission -->19.0-->12.2-->15.4 01/17/24 on last check             --monitor for fevers and other signs of infection.  14. Constipation: Change dulcolax to senna S 2 bid. Sorbitol  today 15.  Low protein stores: Alb-2.52. Juven added.  16. Odynophagia: Will add MMW 17. Class III Obesity: BMI- 40.1. Educate on diet and exercise to promote overall health and mobility.  18. Atelectasis- Add flutter valve.                Sharlet GORMAN Schmitz, PA-C 01/18/2024     I have personally performed a face to face diagnostic evaluation of this patient and formulated the key components of the plan.  Additionally, I have personally reviewed laboratory data, imaging studies, as well as relevant notes and concur with the physician assistant's documentation above.   The patient's status has not changed from the original H&P.  Any changes in documentation from the acute care chart have been noted above.

## 2024-01-18 NOTE — H&P (Signed)
 Physical Medicine and Rehabilitation Admission H&P    Chief Complaint  Patient presents with   Functional deficits due to debility/CVA    HPI:  Timothy Holloway is a 68 year old R handed male with history of T2DM, ESRD--MWF, vertigo,  HTN who was admitted from his HD center with chest pain and SOB. He reported few day history of CP and was found to have pulmonary edema as well as elevated troponin and transferred to [facility] for management.  Patient with NSTEMI and underwent cardiac cath by Dr. Jordan revealing severe 3 V CAD with EF 30-35%.  Dr. Shyrl was consulted and felt that revascularization was the best option. He underwent CABG X 4 on 01/08/24. Post op extubated without difficulty but developed sudden on set of right sided weakness with aphasia on 12/09 am. . CT head negative. 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   He had recurrent issues later that day due to hypotension and  Dr. Voncile recommended permissive HTN.  He had recurrent symptoms with lethargy, left gaze preference and severe dysarthria on 12/10.    Carotid dopplers done revealing 80-99% L-ICA stenosis and Dr. Gretta recommended TCAR for management of symptomatic L-CAS. He underwent L-TCAR on 12/10  and started on liquid diet due to moderate dysphagia noted on FEES. Dr. Jerri recommended Plavix  for stroke due to large vessel disease and hoarseness felt to be due to TCAR v/s re-intubation. MRI brain done revealing numerous small acute infarcts scattered throughout supratentorial and infratentorial brain, greatest involvement in left cerebral hemisphere.   He continued to have orthostatic symptoms with syncopal episode on 12/13.  Required CRRT and has been transitioned to HD as BP improved and midodrine  d/c.  He continues to have orthostatic symptoms with activity. TEDs and binder X 2 used for BP support.  PT/OT has been working  with patient who requires min to max assist with ADLs and min to mod assist for transfers and able to take few steps. He continues to be limited by weakness and sternal precautions. He was as independent PTA and CIR recommended due to functional decline.   Pt reports was taking something for chest soreness- thinks was taking Oxy 2.5 mg, but wife says that makes him confused.   Stil forms urine- 2-3x/day- 100-150cc.  Doesn't remember being told he had mini strokes, but feels his mentation is back to normal- his wife and sister in law agrees.   LBM 5 days ago- usually goes q1-3 days, but denies feeling really constipated.   Low energy, feels so weak when gets up- still having Low BP and Orthostatic hypotension when he stands with therapy.   Has a hoarse voice and feeling like has a lot of post nasal drip.   Review of Systems  Constitutional:  Positive for malaise/fatigue (for past 2 months). Negative for fever.  HENT:  Positive for congestion. Negative for hearing loss.        Sore mouth.hoarse voice and sinus drainage  Eyes:  Negative for blurred vision.  Respiratory:  Negative for cough, shortness of breath and wheezing.   Cardiovascular:  Positive for leg swelling. Negative for chest pain.  Gastrointestinal:  Positive for constipation (no BM in 5 days). Negative for abdominal pain.  Genitourinary:        Still produces urine ~ 100-150cc 2-3x/day  Musculoskeletal:  Positive for back pain (chronic).  Chest soreness  Neurological:  Positive for sensory change (BLE from knees down) and weakness. Negative for dizziness and headaches.  Psychiatric/Behavioral:  Negative for memory loss. The patient has insomnia.   All other systems reviewed and are negative.   Past Medical History:  Diagnosis Date   Anemia    Cataract    Chronic kidney disease (CKD), stage III (moderate) (HCC)    dr Faythe   Depression    Diabetes mellitus without complication (HCC)    DVT (deep venous thrombosis)  (HCC)    as a child   ESRD on dialysis [facility])    M-W-F   Hyperlipidemia    Hypertension    Impotence    Insomnia    Obesity    Steal syndrome as complication of dialysis access     Past Surgical History:  Procedure Laterality Date   A/V FISTULAGRAM Right 10/11/2022   Procedure: A/V Fistulagram;  Surgeon: Serene Gaile ORN, MD;  Location: [facility] INVASIVE CV LAB;  Service: Cardiovascular;  Laterality: Right;   AV FISTULA PLACEMENT Left 01/11/2022   Procedure: LEFT ARM ARTERIOVENOUS (AV) FISTULA CREATION;  Surgeon: Lanis Fonda BRAVO, MD;  Location: [facility] OR;  Service: Vascular;  Laterality: Left;  PERIPHERAL NERVE BLOCK   AV FISTULA PLACEMENT Right 07/26/2022   Procedure: RIGHT UPPER EXTREMITY ARTERIOVENOUS (AV) FISTULA CREATION;  Surgeon: Oris Krystal FALCON, MD;  Location: [facility] ORS;  Service: Vascular;  Laterality: Right;   CORONARY ARTERY BYPASS GRAFT N/A 01/08/2024   Procedure: CORONARY ARTERY BYPASS GRAFTING (CABG) TIMES 3 USING LEFT INTERNAL MAMMARY ARTERY AND ENDOSCOPICALLY HARVESTED RIGHT GREATER SAPHENOUS VEIN;  Surgeon: Shyrl Linnie KIDD, MD;  Location: [facility] OR;  Service: Open Heart Surgery;  Laterality: N/A;  second case   DIALYSIS/PERMA CATHETER INSERTION Right 10/23/2023   Procedure: DIALYSIS/PERMA CATHETER INSERTION;  Surgeon: Magda Debby SAILOR, MD;  Location: [facility] LAB;  Service: Cardiovascular;  Laterality: Right;   INSERTION OF DIALYSIS CATHETER Right 06/16/2022   Procedure: INSERTION OF RIGHT iNTERNAL Jugular TUNNELED DIALYSIS CATHETER;  Surgeon: Serene Gaile ORN, MD;  Location: [facility] OR;  Service: Vascular;  Laterality: Right;   KNEE SURGERY Left 01-31-2002   LEFT HEART CATH AND CORONARY ANGIOGRAPHY N/A 01/05/2024   Procedure: LEFT HEART CATH AND CORONARY ANGIOGRAPHY;  Surgeon: Jordan, Peter M, MD;  Location: [facility] INVASIVE CV LAB;  Service: Cardiovascular;  Laterality: N/A;   LIGATION OF ARTERIOVENOUS  FISTULA Left 06/16/2022   Procedure: LEFT ARM FISTULA LIGATION;  Surgeon: Serene Gaile ORN, MD;   Location: [facility] OR;  Service: Vascular;  Laterality: Left;   LIGATION OF ARTERIOVENOUS  FISTULA Right 11/08/2022   Procedure: LIGATION of RIGHT RADIOCEPHALIC FISTULA;  Surgeon: Sheree Penne Bruckner, MD;  Location: [facility] OR;  Service: Vascular;  Laterality: Right;   SPINE SURGERY     fusion of L4-L5   TEE WITHOUT CARDIOVERSION N/A 01/08/2024   Procedure: ECHOCARDIOGRAM, TRANSESOPHAGEAL;  Surgeon: Shyrl Linnie KIDD, MD;  Location: [facility] OR;  Service: Open Heart Surgery;  Laterality: N/A;   TRANSCAROTID ARTERY REVASCULARIZATION  Left 01/10/2024   Procedure: TRANSCAROTID ARTERY REVASCULARIZATION (TCAR);  Surgeon: Gretta Bruckner PARAS, MD;  Location: [facility] OR;  Service: Vascular;  Laterality: Left;   UPPER EXTREMITY ANGIOGRAPHY Right 11/03/2022   Procedure: Upper Extremity Angiography;  Surgeon: Gretta Bruckner PARAS, MD;  Location: [facility] INVASIVE CV LAB;  Service: Cardiovascular;  Laterality: Right;    Family History  Problem Relation Age of Onset   Diabetes Mother    Cancer Father  lymphoma    Social History:  Married. Independent and was working till a year and half ago.  Has been on dialysis for 2 years. He  reports that he has never smoked. He has never been exposed to tobacco smoke. He does not have any smokeless tobacco history on file. He reports that he does not drink alcohol and does not use drugs.   Allergies[1]   Latex Hives   Lodine [Etodolac] Nausea And Vomiting     Facility-Administered Medications Prior to Admission  Medication Dose Route Frequency Provider Last Rate Last Admin   0.9 %  sodium chloride  infusion  250 mL Intravenous PRN Serene Gaile ORN, MD       0.9 %  sodium chloride  infusion  250 mL Intravenous PRN Gretta Lonni PARAS, MD       sodium chloride  flush (NS) 0.9 % injection 3 mL  3 mL Intravenous Q12H Serene Gaile ORN, MD       sodium chloride  flush (NS) 0.9 % injection 3 mL  3 mL Intravenous PRN Serene Gaile ORN, MD       sodium chloride  flush (NS) 0.9 %  injection 3 mL  3 mL Intravenous Q12H Gretta Lonni PARAS, MD       Medications Prior to Admission  Medication Sig Dispense Refill   acetaminophen  (TYLENOL ) 325 MG tablet Take 650 mg by mouth every 6 (six) hours as needed for moderate pain.     albuterol  (VENTOLIN  HFA) 108 (90 Base) MCG/ACT inhaler Inhale 2 puffs into the lungs every 6 (six) hours as needed for wheezing.     aspirin  81 MG tablet Take 81 mg by mouth daily.     atorvastatin  (LIPITOR) 10 MG tablet Take 1 tablet (10 mg total) by mouth daily. (Patient taking differently: Take 10 mg by mouth at bedtime.) 30 tablet 11   carvedilol  (COREG ) 6.25 MG tablet Take 6.25 mg by mouth 2 (two) times daily with a meal.     Cholecalciferol  (VITAMIN D ) 2000 UNITS CAPS Take 2,000 Units by mouth at bedtime.     cloNIDine  (CATAPRES ) 0.1 MG tablet Take 0.1 mg by mouth 2 (two) times daily.     diclofenac Sodium (VOLTAREN) 1 % GEL Apply 2 g topically daily as needed (pain).     EPINEPHrine  0.3 mg/0.3 mL IJ SOAJ injection Inject 0.3 mg into the muscle as needed for anaphylaxis.     fenofibrate  (TRICOR ) 145 MG tablet Take 1/2 tablet daily (Patient taking differently: Take 72.5 mg by mouth every morning.) 30 tablet 0   furosemide  (LASIX ) 80 MG tablet Take 80 mg by mouth daily.     Glucosamine-Chondroit-Vit C-Mn (GLUCOSAMINE CHONDR 1500 COMPLX) CAPS Take 1 capsule by mouth 2 (two) times daily.     HUMALOG KWIKPEN 200 UNIT/ML KwikPen Inject 10-20 Units into the skin with breakfast, with lunch, and with evening meal. Sliding scale     hydrALAZINE  (APRESOLINE ) 100 MG tablet Take 100 mg by mouth 2 (two) times daily.     hydrochlorothiazide  (HYDRODIURIL ) 25 MG tablet Take 25 mg by mouth daily.     isosorbide  mononitrate (IMDUR ) 30 MG 24 hr tablet Take 30 mg by mouth at bedtime.     LANTUS  SOLOSTAR 100 UNIT/ML Solostar Pen Inject 50 Units into the skin at bedtime.     losartan  (COZAAR ) 100 MG tablet Take 100 mg by mouth at bedtime.     meclizine (ANTIVERT) 25 MG  tablet Take 25 mg by mouth daily as needed for dizziness.  methocarbamol (ROBAXIN) 500 MG tablet Take 1 tablet by mouth daily as needed for muscle spasms.     Multiple Vitamin (MULTIVITAMIN) tablet Take 1 tablet by mouth daily.     sertraline (ZOLOFT) 50 MG tablet Take 50 mg by mouth daily as needed (mood).     sodium chloride  (OCEAN) 0.65 % SOLN nasal spray Place 1 spray into both nostrils daily as needed for congestion.     sucroferric oxyhydroxide (VELPHORO ) 500 MG chewable tablet Chew 500 mg by mouth 3 (three) times daily with meals.     valACYclovir (VALTREX) 1000 MG tablet Take 1,000 mg by mouth 2 (two) times daily.     zolpidem  (AMBIEN ) 10 MG tablet Take 1 tablet (10 mg total) by mouth at bedtime as needed for sleep. 30 tablet 2      Home: Home Living Family/patient expects to be discharged to:: Private residence Living Arrangements: Spouse/significant other Available Help at Discharge: Family, Available PRN/intermittently Type of Home: House Home Access: Stairs to enter Secretary/administrator of Steps: 1 Entrance Stairs-Rails: None Home Layout: One level Bathroom Shower/Tub: Engineer, Manufacturing Systems: Handicapped height Home Equipment: Cane - quad   Functional History: Prior Function Prior Level of Function : Independent/Modified Independent Mobility Comments: Ind, no AD, 1x fall in last 6 months ADLs Comments: Ind  Functional Status:  Mobility: Bed Mobility Overal bed mobility: Needs Assistance Bed Mobility: Rolling, Sidelying to Sit Rolling: Supervision Sidelying to sit: Mod assist Sit to sidelying: Min assist General bed mobility comments: MODA to elevate trunk into sitting from sidelying Transfers Overall transfer level: Needs assistance Equipment used: Rolling walker (2 wheels) Transfers: Sit to/from Stand, Bed to chair/wheelchair/BSC Sit to Stand: Min assist Bed to/from chair/wheelchair/BSC transfer type:: Step pivot Step pivot transfers: Min  assist General transfer comment: pt needed intital MODA to gain balance from initial sit>stand, use of momentum required. pt needed MIN A to pivot to recliner with use of Rw, assist needed for balance and to manage RW, cues needed for RW mgmt and to keep hands on knees when descending into chair Ambulation/Gait Ambulation/Gait assistance: Min assist Gait Distance (Feet): 3 Feet Assistive device: Rolling walker (2 wheels) Gait Pattern/deviations: Step-to pattern, Decreased stride length, Wide base of support, Antalgic General Gait Details: Limited distance due to pt becoming increasingly lightheaded when standing. Pt needed minA to step pivot to R from recliner to bed with RW support. Pt expressed L knee chronic pain/issues with gait Gait velocity: reduced Gait velocity interpretation: <1.31 ft/sec, indicative of household ambulator    ADL: ADL Overall ADL's : Needs assistance/impaired Eating/Feeding: Set up Grooming: Set up Upper Body Bathing: Minimal assistance Lower Body Bathing: Maximal assistance Upper Body Dressing : Sitting, Moderate assistance Upper Body Dressing Details (indicate cue type and reason): donning gown on backside Lower Body Dressing: Maximal assistance, Sit to/from stand Lower Body Dressing Details (indicate cue type and reason): donning TEDs and socks Toilet Transfer: Minimal assistance, Stand-pivot Toilet Transfer Details (indicate cue type and reason): simulated via stand pivot with Rw to recliner Functional mobility during ADLs: Minimal assistance, Cueing for safety, Cueing for sequencing, Rolling walker (2 wheels) General ADL Comments: ADL participation limited by impaired balance, sternal precautions and generalized weakness  Cognition: Cognition Orientation Level: Oriented X4 Cognition Arousal: Alert Behavior During Therapy: WFL for tasks assessed/performed, Flat affect  Physical Exam: Blood pressure (!) 98/36, pulse 77, temperature 98.1 F (36.7 C),  temperature source Oral, resp. rate 20, height 6' (1.829 m), weight 134.2 kg, SpO2 98%. Physical  Exam Vitals and nursing note reviewed. Exam conducted with a chaperone present.  Constitutional:      Appearance: He is well-developed. He is obese.     Comments: Sitting up in bed; sister in law and wife at bedside, awake, alert, appropriate, quiet, interactive, NAD  HENT:     Head: Normocephalic and atraumatic.     Comments: No facial droop Facial sensation intact B/L Tongue midline    Right Ear: External ear normal.     Left Ear: External ear normal.     Nose: Nose normal. No congestion.     Mouth/Throat:     Mouth: Mucous membranes are dry.     Pharynx: No oropharyngeal exudate.  Eyes:     General:        Right eye: No discharge.        Left eye: No discharge.     Extraocular Movements: Extraocular movements intact.  Neck:     Comments: L anterior/side of neck with incision glued Cardiovascular:     Rate and Rhythm: Normal rate and regular rhythm.     Heart sounds: Normal heart sounds. No murmur heard.    No gallop.  Pulmonary:     Effort: Pulmonary effort is normal. No respiratory distress.     Breath sounds: No wheezing, rhonchi or rales.     Comments: Decreased slightly at bases B/L Hoarse voice Chest:     Comments: Midline chest incision with mild erythema and linear scabbing along incision line. Right calf incision C/D/I  Abdominal:     Palpations: Abdomen is soft.     Tenderness: There is no abdominal tenderness.     Comments: Multiple ecchymotic area on lower abdominal wall Protuberant due to weight hard to assess if he's distended;' hypoactive BS  Genitourinary:    Comments: Light amber urine in urinal- ~ 125cc Musculoskeletal:        General: Normal range of motion.     Cervical back: Neck supple.     Comments: Ue's 5/5 throughout B/L- checked Deltoids, Biceps, triceps, WE, grip and FA B/L LE's HF 4+/5; otherwise 5-/5 in KE/KF/DF and PF B?L  Skin:    General:  Skin is warm and dry.     Comments: Chest sternal incision- glued and look good until dried blood- black in scab- that's thick on base of incision- scant drainage Trace LE edema to distal calves B/L IV R wrist/forearm- Looks OK R inner knee 1 inch incision glued- looks good B/L UEs bruising from IV's and blood draws, and maybe BP cuff   Neurological:     Mental Status: He is alert.     Comments: Ox3- knew at [facility] and why here except didn't remember the strokes Knew December 2025- and next holiday was xmas eve Intact to light touch in all 4 extremities except form knees down in stocking pattern due to neuropathy B/L CN's OK per HENT Naming intact 3/3; and repetition intact No increased tone (-) clonus/hoffman's   Psychiatric:     Comments: A little flat, quiet     Results for orders placed or performed during the hospital encounter of 01/05/24 (from the past 48 hours)  Glucose, capillary     Status: Abnormal   Collection Time: 01/16/24  5:41 PM  Result Value Ref Range   Glucose-Capillary 210 (H) 70 - 99 mg/dL    Comment: Glucose reference range applies only to samples taken after fasting for at least 8 hours.  Glucose, capillary  Status: Abnormal   Collection Time: 01/16/24  8:11 PM  Result Value Ref Range   Glucose-Capillary 229 (H) 70 - 99 mg/dL    Comment: Glucose reference range applies only to samples taken after fasting for at least 8 hours.  Glucose, capillary     Status: Abnormal   Collection Time: 01/16/24  9:04 PM  Result Value Ref Range   Glucose-Capillary 203 (H) 70 - 99 mg/dL    Comment: Glucose reference range applies only to samples taken after fasting for at least 8 hours.  Glucose, capillary     Status: Abnormal   Collection Time: 01/16/24 11:20 PM  Result Value Ref Range   Glucose-Capillary 166 (H) 70 - 99 mg/dL    Comment: Glucose reference range applies only to samples taken after fasting for at least 8 hours.  Glucose, capillary      Status: Abnormal   Collection Time: 01/17/24  3:27 AM  Result Value Ref Range   Glucose-Capillary 119 (H) 70 - 99 mg/dL    Comment: Glucose reference range applies only to samples taken after fasting for at least 8 hours.  CBC     Status: Abnormal   Collection Time: 01/17/24  3:51 AM  Result Value Ref Range   WBC 15.4 (H) 4.0 - 10.5 K/uL   RBC 2.46 (L) 4.22 - 5.81 MIL/uL   Hemoglobin 7.6 (L) 13.0 - 17.0 g/dL   HCT 76.2 (L) 60.9 - 47.9 %   MCV 96.3 80.0 - 100.0 fL   MCH 30.9 26.0 - 34.0 pg   MCHC 32.1 30.0 - 36.0 g/dL   RDW 83.5 (H) 88.4 - 84.4 %   Platelets 323 150 - 400 K/uL   nRBC 0.0 0.0 - 0.2 %    Comment: Performed at [facility], [address], [place], [place] [address]  Basic metabolic panel     Status: Abnormal   Collection Time: 01/17/24  3:51 AM  Result Value Ref Range   Sodium 133 (L) 135 - 145 mmol/L   Potassium 4.9 3.5 - 5.1 mmol/L   Chloride 95 (L) 98 - 111 mmol/L   CO2 25 22 - 32 mmol/L   Glucose, Bld 125 (H) 70 - 99 mg/dL    Comment: Glucose reference range applies only to samples taken after fasting for at least 8 hours.   BUN 74 (H) 8 - 23 mg/dL   Creatinine, Ser 4.10 (H) 0.61 - 1.24 mg/dL   Calcium  8.9 8.9 - 10.3 mg/dL   GFR, Estimated 10 (L) >60 mL/min    Comment: (NOTE) Calculated using the CKD-EPI Creatinine Equation (2021)    Anion gap 13 5 - 15    Comment: Performed at [facility], [address] [place], [place] [address]  Glucose, capillary     Status: Abnormal   Collection Time: 01/17/24  6:08 AM  Result Value Ref Range   Glucose-Capillary 128 (H) 70 - 99 mg/dL    Comment: Glucose reference range applies only to samples taken after fasting for at least 8 hours.  Glucose, capillary     Status: Abnormal   Collection Time: 01/17/24  1:05 PM  Result Value Ref Range   Glucose-Capillary 141 (H) 70 - 99 mg/dL    Comment: Glucose reference range applies only to samples taken after fasting for at least 8 hours.  Glucose, capillary      Status: Abnormal   Collection Time: 01/17/24  5:13 PM  Result Value Ref Range  Glucose-Capillary 192 (H) 70 - 99 mg/dL    Comment: Glucose reference range applies only to samples taken after fasting for at least 8 hours.  Glucose, capillary     Status: Abnormal   Collection Time: 01/17/24  8:51 PM  Result Value Ref Range   Glucose-Capillary 172 (H) 70 - 99 mg/dL    Comment: Glucose reference range applies only to samples taken after fasting for at least 8 hours.   Comment 1 Notify RN    Comment 2 Document in Chart   Glucose, capillary     Status: Abnormal   Collection Time: 01/18/24  6:14 AM  Result Value Ref Range   Glucose-Capillary 159 (H) 70 - 99 mg/dL    Comment: Glucose reference range applies only to samples taken after fasting for at least 8 hours.  Glucose, capillary     Status: Abnormal   Collection Time: 01/18/24  8:04 AM  Result Value Ref Range   Glucose-Capillary 141 (H) 70 - 99 mg/dL    Comment: Glucose reference range applies only to samples taken after fasting for at least 8 hours.  Glucose, capillary     Status: Abnormal   Collection Time: 01/18/24 12:19 PM  Result Value Ref Range   Glucose-Capillary 196 (H) 70 - 99 mg/dL    Comment: Glucose reference range applies only to samples taken after fasting for at least 8 hours.   No results found.    Blood pressure (!) 98/36, pulse 77, temperature 98.1 F (36.7 C), temperature source Oral, resp. rate 20, height 6' (1.829 m), weight 134.2 kg, SpO2 98%.  Medical Problem List and Plan: 1. 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  Antithrombotics: -DVT/anticoagulation:  Pharmaceutical: Heparin   -antiplatelet therapy: Plavix  and ASA. 3. Pain Management: Low dose oxycodone  for severe pain and tramadol  for moderate pain.   --Continue lidocaine  patch to chest wall for local measures 4.  Mood/Behavior/Sleep: LCSW to follow for evaluation and support.   --Will schedule Ambien  5 mg (used 10 mg PTA) for acute on chronic insomnia- due to age, cannot do 10 mg unless Ok'd by pharmacy.   -antipsychotic agents: N/A 5. Neuropsych/cognition: This patient is capable of making decisions on his own behalf. 6. Skin/Wound Care: Monitor incisions for healing--paint with betadine bid.   --Gerhardt's and foam dressing to partial thickness sacral wound. Juven added to promote wound healing.  7. Fluids/Electrolytes/Nutrition: Strict I/O. 1200 cc Fr added --Labs with HD.  8. Significant Orthostatic hypotension Impacting therapy: TEDs/Binder as continues to have BP drop but asymptomatic per OT notes  --Sitting 113/33-->stand 130/42--> 98/33 after sitting 30 mins.   --also has Hx of vertigo. -has Midodrine  prn- suggest using it - also suggest TEDs and maybe ACE wraps on LE's 9. CAD s/p CABG 01/08/24: Continue sternal precautions  --On Plavix , ASA and Atorvastatin .  10.  T2DM: Hgb A1c- 9.9. Monitor BS ac/hs and use SSI for elevated BS. Add CM restrictions to diet. Used Lantus  50 units PTA  --On Lantus  25 units BID with 5 units TID ac for  meal coverage.  11. ESRD: Off CRRT and back on MWF HD schedule. Midodrine  prn low BP --schedule therapy at the end of the day to help with tolerance of therapy.  12.  Acute on chronic anemia: Hgb now in 7 range. On Epo 13. Leucocytosis: Has had leucocytosis which continues to fluctuate  --12.2 at admission -->19.0-->12.2-->15.4 01/17/24  on last check  --monitor for fevers and other signs of infection.  14. Constipation: Change dulcolax to senna S 2 bid. Sorbitol  today 15.  Low protein stores: Alb-2.52. Juven added.  16. Odynophagia: Will add MMW 17. Class III Obesity: BMI- 40.1. Educate on diet and exercise to promote overall health and mobility.  18. Atelectasis- Add flutter valve.         Sharlet GORMAN Schmitz, PA-C 01/18/2024   I have personally performed  a face to face diagnostic evaluation of this patient and formulated the key components of the plan.  Additionally, I have personally reviewed laboratory data, imaging studies, as well as relevant notes and concur with the physician assistant's documentation above.   The patient's status has not changed from the original H&P.  Any changes in documentation from the acute care chart have been noted above.       [1]  Allergies Allergen Reactions   Latex Hives   Lodine [Etodolac] Nausea And Vomiting

## 2024-01-18 NOTE — Progress Notes (Signed)
°  [facility] Progress Note   Subjective:    Seen in room 1 L UF w/ HD yesterday, had min BP drops into 130s Labs yest showed K+ 4.9, bun 74  Objective Vitals:   01/18/24 0427 01/18/24 0500 01/18/24 0804 01/18/24 1220  BP: 134/89  (!) 120/59 (!) 98/36  Pulse: 65  79 77  Resp: 18  20 20   Temp: 98.1 F (36.7 C)  97.9 F (36.6 C) 98.1 F (36.7 C)  TempSrc: Oral  Oral Oral  SpO2: 96%  99% 98%  Weight:  134.2 kg    Height:       Physical Exam General: alert, nad, on RA No JVD Heart: RRR Lungs: CTA bilat  Abdomen: soft, NTND Extremities: trace LE edema Dialysis Access: [facility]  Neuro: alert, nonfocal, ox 3    Dialysis Orders: [facility] MWF 4h B350  133.5kg  TDC  Heparin  2000+ 1400u/hr Calcitriol 1.25 three times per week Mircera 90 mcg q 2 wks (last 12/1)    CXR 12/11-> resolved vasc congestion, improved IS edema   Assessment/Plan: ESRD - usual HD MWF. CRRT used from 12/10 - 12/13 for BP stability in the face of stroke-like symptoms. Stable off of CRRT. Tolerated regular HD Monday and Wednesday. Next HD tomorrow.  NSTEMI - 3V CAD by LHC -> s/p CABG 12/08 Stroke-like symptoms: was waxing/ waning per neuro. There was no acute CVA by imaging, but due to advanced carotid disease by CTA pt underwent L carotid artery stent per [facility] 12/10.  BP: labile, some low and most 110- 130 Volume - vasc congestion resolved w/ last CXR. Now on RA. Standing wt 12/16 was 134.2kg. UF goal 1-1.5 L next HD.  Anemia - Hgb 7- 9 recently. Getting ESA here w/ darbe 100 mcg weekly. No iron with high ferritin.  Transfuse prn.  Nutrition - Renal diet with fluid restriction    Myer Fret  MD  [facility] 01/18/2024, 12:56 PM  Recent Labs  Lab 01/15/24 0457 01/16/24 0152 01/17/24 0351  HGB 7.5*  --  7.6*  ALBUMIN  1.9* 2.5*  --   CALCIUM  8.1* 7.9* 8.9  PHOS 4.2  4.0 3.3  --   CREATININE 5.49* 4.20* 5.89*  K 4.6 4.0 4.9    Inpatient medications:  aspirin  EC  81 mg Oral Daily    atorvastatin   80 mg Oral Daily   bisacodyl   10 mg Oral Daily   Or   bisacodyl   10 mg Rectal Daily   Chlorhexidine  Gluconate Cloth  6 each Topical Q0600   Chlorhexidine  Gluconate Cloth  6 each Topical Q0600   clopidogrel   75 mg Oral Daily   darbepoetin (ARANESP ) injection - DIALYSIS  100 mcg Subcutaneous Q Thu-1800   Gerhardt's butt cream   Topical Daily   heparin  injection (subcutaneous)  5,000 Units Subcutaneous Q8H   insulin  aspart  0-24 Units Subcutaneous TID AC & HS   insulin  aspart  5 Units Subcutaneous TID WC   insulin  glargine  25 Units Subcutaneous BID   lidocaine   2 patch Transdermal Q24H   multivitamin  1 tablet Oral QHS   pantoprazole   40 mg Oral Daily   sodium chloride  flush  10-40 mL Intracatheter Q12H   sodium chloride  flush  3 mL Intravenous Q12H     albuterol , midodrine , ondansetron  (ZOFRAN ) IV, mouth rinse, oxyCODONE , sodium chloride  flush, sodium chloride  flush, traMADol , zolpidem

## 2024-01-18 NOTE — Progress Notes (Signed)
 CARDIAC REHAB PHASE I   Post OHS education including site care, restrictions, heart healthy diabetic diet, sternal precautions, ongoing IS use, and CRP2 reviewed. All questions and concerns addressed. Will refer to [facility] for CRP2.   3:30-4:00 Isaiah JAYSON Liverpool, RN BSN 01/18/2024 4:02 PM

## 2024-01-18 NOTE — Progress Notes (Signed)
° °   °  [address] [place] [address]             [phone]         8 Days Post-Op Procedures (LRB): TRANSCAROTID ARTERY REVASCULARIZATION (TCAR) (Left)  Subjective:  Patient sitting up in chair eating breakfast.  He is working with PT.  He has moved his bowels.    Objective: Vital signs in last 24 hours: Temp:  [97.8 F (36.6 C)-98.2 F (36.8 C)] 97.9 F (36.6 C) (12/18 0804) Pulse Rate:  [60-79] 79 (12/18 0804) Cardiac Rhythm: Normal sinus rhythm;Heart block;Bundle branch block (12/18 0804) Resp:  [13-20] 20 (12/18 0804) BP: (120-165)/(54-89) 120/59 (12/18 0804) SpO2:  [96 %-100 %] 99 % (12/18 0804) Weight:  [134.2 kg-134.7 kg] 134.2 kg (12/18 0500)  Intake/Output from previous day: 12/17 0701 - 12/18 0700 In: -  Out: 1000   General appearance: alert, cooperative, and no distress Heart: regular rate and rhythm Lungs: diminished breath sounds left base Abdomen: soft, non-tender; bowel sounds normal; no masses,  no organomegaly Extremities: edema trace Wound: clean and dry  Lab Results: Recent Labs    01/17/24 0351  WBC 15.4*  HGB 7.6*  HCT 23.7*  PLT 323   BMET:  Recent Labs    01/16/24 0152 01/17/24 0351  NA 133* 133*  K 4.0 4.9  CL 97* 95*  CO2 26 25  GLUCOSE 151* 125*  BUN 50* 74*  CREATININE 4.20* 5.89*  CALCIUM  7.9* 8.9    PT/INR: No results for input(s): LABPROT, INR in the last 72 hours. ABG    Component Value Date/Time   PHART 7.430 01/12/2024 1028   HCO3 22.9 01/12/2024 1028   TCO2 24 01/12/2024 1028   ACIDBASEDEF 1.0 01/12/2024 1028   O2SAT 95 01/12/2024 1028   CBG (last 3)  Recent Labs    01/17/24 2051 01/18/24 0614 01/18/24 0804  GLUCAP 172* 159* 141*    Assessment/Plan: S/P Procedures (LRB): TRANSCAROTID ARTERY REVASCULARIZATION (TCAR) (Left)  CV- NSR, BP improving...on Midodrine  5 mg every 8 hours as needed Pulm- no acute issues, diminished left base.. will get CXR to assess for  pleural effusion Renal-ESRD on HD.SABRA per Nephrology Left MCA infarct due to large vessel disease- due to ICA stenosis.. S/P TCAR DM- sugars improved continue current insulin  regimen Deconditioning- PT/OT recs CIR.SABRA awaiting insurance approval  Patient is stable for discharge once CIR bed is available/approved   LOS: 13 days    Rocky Shad, PA-C 01/18/2024 8:31 AM

## 2024-01-18 NOTE — Progress Notes (Signed)
 Inpatient Rehab Admissions Coordinator:   Awaiting determination from [facility] for CIR prior auth request.    Reche Lowers, PT, DPT Admissions Coordinator [phone] 01/18/2024 10:20 AM

## 2024-01-18 NOTE — Progress Notes (Signed)
 Inpatient Rehab Admissions Coordinator:    I have insurance approval and a bed available for pt to admit to CIR today. Laurel Grit, PA-C, in agreement and [facility] aware.  I will notify pt/family and make arrangements.    Reche Lowers, PT, DPT Admissions Coordinator [phone] 01/18/2024 2:26 PM

## 2024-01-18 NOTE — Progress Notes (Signed)
 Occupational Therapy Treatment Patient Details Name: Timothy Holloway MRN: [id number] DOB: October 28, 1955 Today's Date: 01/18/2024   History of present illness 68 yo M adm 12/5 to [facility] due to CP with troponin level 1121 and glucose 306. Workup for NSTEMI, transferred to [facility] 12/8 s/p CABGx3 12/9 facial droop R arm and leg weakness with hypotension with return to baseline incerase BP imaging negative for acute infarct,known L ICA stenosis. CRRT 12/10-12/13. Code stroke re-activated 12/10 for increased R sided weakness. 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 S/p emergent L TCAR 12/10. PMH ESRD on HD (MWF) DM2, obese, HTN HLD, depression, anemia insomnia CKD steal syndrome, R and L fistula ,back surg   OT comments  Pt greeted EOB wanting to get to EOB to urinate. Pt currently requires MODA for bed mobility, MIN A for sit>stand and MIN A to pivot to recliner with RW. Vitals assessed below. Pt asymptomatic during session. Reviewed sternal precaution restrictions and implications on ADL participation. Nurse agreeable to leave pt in recline with supervision with BLEs elevated. Pt eager for AIR. Patient will benefit from intensive inpatient follow-up therapy, >3 hours/day  unable to locate ABD binder but donned teds from EOB sitting BP- 113/33(53) HR 68 standing- 130/42( 67) HR 74 sitting in recliner 98/33( 51) HR 70 sitting in recliner with BLE elevated- 97/39( 55) HR 66        If plan is discharge home, recommend the following:  Assistance with cooking/housework;Help with stairs or ramp for entrance;A little help with walking and/or transfers;A little help with bathing/dressing/bathroom;Assist for transportation   Equipment Recommendations  Other (comment) (defer)    Recommendations for Other Services      Precautions / Restrictions Precautions Precautions:  Fall;Sternal Precaution Booklet Issued: No Recall of Precautions/Restrictions: Intact Precaution/Restrictions Comments: SBP > 100 goal, syncope (delayed ~30 min after sitting up) with nursing 12/13, x2 abdominal binders, TED hose Restrictions Weight Bearing Restrictions Per Provider Order: No Other Position/Activity Restrictions: Sternal precautions       Mobility Bed Mobility Overal bed mobility: Needs Assistance Bed Mobility: Rolling, Sidelying to Sit Rolling: Supervision Sidelying to sit: Mod assist       General bed mobility comments: MODA to elevate trunk into sitting from sidelying    Transfers Overall transfer level: Needs assistance Equipment used: Rolling walker (2 wheels) Transfers: Sit to/from Stand, Bed to chair/wheelchair/BSC Sit to Stand: Min assist     Step pivot transfers: Min assist     General transfer comment: pt needed intital MODA to gain balance from initial sit>stand, use of momentum required. pt needed MIN A to pivot to recliner with use of Rw, assist needed for balance and to manage RW, cues needed for RW mgmt and to keep hands on knees when descending into chair     Balance Overall balance assessment: Needs assistance Sitting-balance support: No upper extremity supported, Feet supported Sitting balance-Leahy Scale: Fair Sitting balance - Comments: static sitting EOB with CGA for safety   Standing balance support: Bilateral upper extremity supported, During functional activity, Reliant on assistive device for balance Standing balance-Leahy Scale: Poor Standing balance comment: reliant on RW and CGA, pt did stand for prolonged period for BP readings with no LOB                           ADL either performed or assessed with clinical judgement  ADL Overall ADL's : Needs assistance/impaired                     Lower Body Dressing: Maximal assistance;Sit to/from stand Lower Body Dressing Details (indicate cue type and  reason): donning TEDs and socks Toilet Transfer: Minimal assistance;Stand-pivot Toilet Transfer Details (indicate cue type and reason): simulated via stand pivot with Rw to recliner         Functional mobility during ADLs: Minimal assistance;Cueing for safety;Cueing for sequencing;Rolling walker (2 wheels) General ADL Comments: ADL participation limited by impaired balance, sternal precautions and generalized weakness    Extremity/Trunk Assessment Upper Extremity Assessment Upper Extremity Assessment: Generalized weakness   Lower Extremity Assessment Lower Extremity Assessment: Defer to PT evaluation   Cervical / Trunk Assessment Cervical / Trunk Assessment: Normal    Vision Baseline Vision/History: 0 No visual deficits Patient Visual Report: No change from baseline     Perception Perception Perception: Not tested   Praxis Praxis Praxis: Not tested   Communication Communication Communication: No apparent difficulties   Cognition Arousal: Alert Behavior During Therapy: WFL for tasks assessed/performed, Flat affect Cognition: No apparent impairments                               Following commands: Intact        Cueing   Cueing Techniques: Verbal cues  Exercises Other Exercises Other Exercises: pt completed seated LAQs, seated marches and ankle pumps from recliner for BP regulation    Shoulder Instructions       General Comments unable to locate ABD binder but donned teds from EOB. sitting BP- 113/33(53) HR 68, standing- 130/42( 67) HR 74, sitting in recliner 98/33( 51) HR 70, sitting in recliner with BLE elevated- 97/39( 55) HR 66    Pertinent Vitals/ Pain       Pain Assessment Pain Assessment: No/denies pain  Home Living                                          Prior Functioning/Environment              Frequency  Min 2X/week        Progress Toward Goals  OT Goals(current goals can now be found in the care  plan section)  Progress towards OT goals: Progressing toward goals  Acute Rehab OT Goals Patient Stated Goal: to go to rehab OT Goal Formulation: With patient Time For Goal Achievement: 01/25/24 Potential to Achieve Goals: Good  Plan      Co-evaluation                 AM-PAC OT 6 Clicks Daily Activity     Outcome Measure   Help from another person eating meals?: None Help from another person taking care of personal grooming?: A Little Help from another person toileting, which includes using toliet, bedpan, or urinal?: A Lot Help from another person bathing (including washing, rinsing, drying)?: A Lot Help from another person to put on and taking off regular upper body clothing?: A Little Help from another person to put on and taking off regular lower body clothing?: A Lot 6 Click Score: 16    End of Session Equipment Utilized During Treatment: Gait belt;Rolling walker (2 wheels)  OT Visit Diagnosis: Unsteadiness on feet (R26.81);Other abnormalities of gait and mobility (R26.89);Muscle  weakness (generalized) (M62.81)   Activity Tolerance Patient tolerated treatment well   Patient Left in chair;with call bell/phone within reach   Nurse Communication Mobility status        Time: 8873-8850 OT Time Calculation (min): 23 min  Charges: OT General Charges $OT Visit: 1 Visit OT Treatments $Self Care/Home Management : 8-22 mins  Ronal Mallie POUR., COTA/L Acute Rehabilitation Services [phone]   Ronal Mallie Needy 01/18/2024, 12:04 PM

## 2024-01-18 NOTE — Discharge Instructions (Addendum)
 Inpatient Rehab Discharge Instructions  Timothy Holloway Discharge date and time:    Activities/Precautions/ Functional Status: Activity: no lifting, driving, or strenuous exercise till cleared by MD. Continue Sternal precautions  Diet: diabetic diet and renal diet Limit fluids to 1200 cc/day Wound Care:  --Keep Chest wall incision clean and dry Buttocks: Daily cleanse with saline. Pat dry and apply a layer of santyl . Cover with moist to dry dressing and change dressing daily. Cover with foam dressing for padding and this can be changed every 2-3 days.     Functional status:  ___ No restrictions     ___ Walk up steps independently ___ 24/7 supervision/assistance   ___ Walk up steps with assistance ___ Intermittent supervision/assistance  ___ Bathe/dress independently ___ Walk with walker     ___ Bathe/dress with assistance _X__ Walk Independently    ___ Shower independently ___ Walk with assistance    ___ Shower with assistance _X__ No alcohol     ___ Return to work/school ________  Special Instructions: Continue sternal precautions 2. Monitor blood sugars before meals and at bedtime. Note that Lantus  has been decreased due to low blood sugars. If blood sugars at home start trending up increase Lantus  by 5 units daily to home dose.  3. Has been referred to wound care clinic for monitoring of buttock wound.     COMMUNITY REFERRALS UPON DISCHARGE:    Home Health:   PT      RN    Wound on buttock                 Agency: [facility]  Phone: [phone]   Medical Equipment/Items Ordered: Rollator and TTB                                                  Agency/Supplier: [employer]   STROKE/TIA DISCHARGE INSTRUCTIONS SMOKING Cigarette smoking nearly doubles your risk of having a stroke & is the single most alterable risk factor  If you smoke or have smoked in the last 12 months, you are advised to quit smoking for your health. Most of the excess cardiovascular risk related to smoking  disappears within a year of stopping. Ask you doctor about anti-smoking medications [facility] Quit Line: 1-800-QUIT NOW Free Smoking Cessation Classes [phone]  CHOLESTEROL Know your levels; limit fat & cholesterol in your diet  Lipid Panel     Component Value Date/Time   CHOL 88 01/06/2024 0417   CHOL 106 08/06/2012 0931   TRIG 103 01/14/2024 0431   TRIG 116 11/09/2012 0938   TRIG 92 08/06/2012 0931   HDL 34 (L) 01/06/2024 0417   HDL 35 (L) 11/09/2012 0938   HDL 40 08/06/2012 0931   CHOLHDL 2.6 01/06/2024 0417   VLDL 21 01/06/2024 0417   LDLCALC 33 01/06/2024 0417   LDLCALC 35 11/09/2012 0938   LDLCALC 48 08/06/2012 0931     Many patients benefit from treatment even if their cholesterol is at goal. Goal: Total Cholesterol (CHOL) less than 160 Goal:  Triglycerides (TRIG) less than 150 Goal:  HDL greater than 40 Goal:  LDL (LDLCALC) less than 100   BLOOD PRESSURE American Stroke Association blood pressure target is less that 120/80 mm/Hg  Your discharge blood pressure is:  BP: 110/74 Monitor your blood pressure Limit your salt and alcohol intake Many individuals will require more  than one medication for high blood pressure  DIABETES (A1c is a blood sugar average for last 3 months) Goal HGBA1c is under 7% (HBGA1c is blood sugar average for last 3 months)  Diabetes:     Lab Results  Component Value Date   HGBA1C 9.9 (H) 01/05/2024    Your HGBA1c can be lowered with medications, healthy diet, and exercise. Check your blood sugar as directed by your physician Call your physician if you experience unexplained or low blood sugars.  PHYSICAL ACTIVITY/REHABILITATION Goal is 30 minutes at least 4 days per week  Activity: No driving, Therapies: see above Return to work: N/A Activity decreases your risk of heart attack and stroke and makes your heart stronger.  It helps control your weight and blood pressure; helps you relax and can improve your mood. Participate in a regular  exercise program. Talk with your doctor about the best form of exercise for you (dancing, walking, swimming, cycling).  DIET/WEIGHT Goal is to maintain a healthy weight  Your discharge diet is:  Diet Order             Diet renal/carb modified with fluid restriction Fluid restriction: 1200 mL Fluid; Room service appropriate? Yes; Fluid consistency: Thin  Diet effective now                  thin liquids Your height is:  Height: 6' (182.9 cm) Your current weight is: Weight: (S) 132.3 kg (Bed Scale) Your Body Mass Index (BMI) is:  BMI (Calculated): 39.55 Following the type of diet specifically designed for you will help prevent another stroke. Your goal weight is:  184 lbs Your goal Body Mass Index (BMI) is 19-24. Healthy food habits can help reduce 3 risk factors for stroke:  High cholesterol, hypertension, and excess weight.  RESOURCES Stroke/Support Group:  Call [phone]   STROKE EDUCATION PROVIDED/REVIEWED AND GIVEN TO PATIENT Stroke warning signs and symptoms How to activate emergency medical system (call 911). Medications prescribed at discharge. Need for follow-up after discharge. Personal risk factors for stroke. Pneumonia vaccine given:  Flu vaccine given:  My questions have been answered, the writing is legible, and I understand these instructions.  I will adhere to these goals & educational materials that have been provided to me after my discharge from the hospital.     My questions have been answered and I understand these instructions. I will adhere to these goals and the provided educational materials after my discharge from the hospital.  Patient/Caregiver Signature _______________________________ Date __________  Clinician Signature _______________________________________ Date __________  Please bring this form and your medication list with you to all your follow-up doctor's appointments.

## 2024-01-18 NOTE — Progress Notes (Signed)
 Cornelio Bouchard, MD  Physician Physical Medicine and Rehabilitation   PMR Pre-admission    Signed   Date of Service: 01/18/2024 10:01 AM  Related encounter: ED to Hosp-Admission (Discharged) from 01/05/2024 in [facility] 4E CV SURGICAL PROGRESSIVE CARE   Signed     Expand All Collapse All  PMR Admission Coordinator Pre-Admission Assessment   Patient: Timothy Holloway is an 68 y.o., male MRN: [id number] DOB: November 19, 1955 Height: 6' (182.9 cm) Weight: 134.2 kg   Insurance Information HMO:     PPO: yes     PCP:      IPA:      80/20:      OTHER:  PRIMARY: [employer]      Policy#: [id number]     Subscriber: pt CM Name: Timothy Holloway      Phone#:[phone]  Fax#: epic access Pre-Cert#: [id number]      auth for CIR from Timothy Holloway with [facility] for admit 12/18 with next review date 01/24/24 and they have EMR access        Employer:  Benefits:  Phone #: [phone]     Name:  Eff. Date: 02/01/23     Deduct: $0      Out of Pocket Max: $3500 (met $150)      Life Max: n/a CIR: $225/day for days 1-6      SNF: 20 full days Outpatient:      Co-Pay: $15/visit Home Health: 80%      Co-Pay: 20% DME: 80%     Co-Pay: 20% Providers:  SECONDARY:       Policy#:      Phone#:    Artist:       Phone#:    The Best Boy for patients in Inpatient Rehabilitation Facilities with attached Privacy Act Statement-Health Care Records was provided and verbally reviewed with: Patient and Family   Emergency Contact Information Contact Information       Name Relation Home Work Mobile    Holloway,Timothy Spouse [phone]         Other Contacts   None on File        Current Medical History  Patient Admitting Diagnosis: CABG/CVA    History of Present Illness: Pt is a 68 y/o male with PMH of ESRD on HD MWF, DM, HTN, depression, CKD, steal syndrome, who presented to [facility] on 12/5 with chest pain, elevated troponin and hyperglycemia.  He was  diagnosed with NSTEMI and transferred to [facility] on 12/8 for a CABG x3 per Dr. Shyrl. POD 1 he developed R hemiparesis and facial droop with associated hypotension.  Imaging was negative and felt pt hypoperfusing.  Symptoms persisted through 12/10 and MRI showed scattered infarcts throughout the supratentorial and infratentorial brain primarily in the L cerebral hemisphere.  Consult to vascular and he underwent a TCAR on 12/10 per Dr. Gretta.  Therapy has been ongoing and he was recommended for CIR. Patients medical and functional status supports admission to an inpatient rehabilitation program due to multifactorial deficits, such that continued progress would require a coordinated, team-based approach with physician oversight to achieve meaningful gains in mobility, ADL independence, and safety.     Complete NIHSS TOTAL: 0   Patient's medical record from [facility] and [facility] has been reviewed by the rehabilitation admission coordinator and physician.   Past Medical History      Past Medical History:  Diagnosis Date   Anemia     Cataract  Chronic kidney disease (CKD), stage III (moderate) (HCC)      dr Faythe   Depression     Diabetes mellitus without complication (HCC)     DVT (deep venous thrombosis) (HCC)      as a child   ESRD on dialysis (HCC)      M-W-F   Hyperlipidemia     Hypertension     Impotence     Insomnia     Obesity     Steal syndrome as complication of dialysis access            Has the patient had major surgery during 100 days prior to admission? Yes   Family History   family history includes Cancer in his father; Diabetes in his mother.   Current Medications [Current Medications]  [Current Medications]    Current Facility-Administered Medications:    albuterol  (PROVENTIL ) (2.5 MG/3ML) 0.083% nebulizer solution 2.5 mg, 2.5 mg, Nebulization, Q4H PRN, Schuh, McKenzi P, PA-C   aspirin  EC tablet 81 mg, 81 mg, Oral, Daily, Lightfoot, Harrell O, MD,  81 mg at 01/17/24 1324   atorvastatin  (LIPITOR) tablet 80 mg, 80 mg, Oral, Daily, Lightfoot, Harrell O, MD, 80 mg at 01/17/24 1325   bisacodyl  (DULCOLAX) EC tablet 10 mg, 10 mg, Oral, Daily, 10 mg at 01/17/24 1325 **OR** bisacodyl  (DULCOLAX) suppository 10 mg, 10 mg, Rectal, Daily, Schuh, McKenzi P, PA-C   Chlorhexidine  Gluconate Cloth 2 % PADS 6 each, 6 each, Topical, Q0600, Geralynn Charleston, MD, 6 each at 01/17/24 0549   Chlorhexidine  Gluconate Cloth 2 % PADS 6 each, 6 each, Topical, Q0600, Geralynn Charleston, MD, 6 each at 01/18/24 [phone]   clopidogrel  (PLAVIX ) tablet 75 mg, 75 mg, Oral, Daily, Lightfoot, Harrell O, MD, 75 mg at 01/17/24 1325   Darbepoetin Alfa  (ARANESP ) injection 100 mcg, 100 mcg, Subcutaneous, Q Thu-1800, Geralynn Charleston, MD, 100 mcg at 01/11/24 2100   Gerhardt's butt cream, , Topical, Daily, Gretta Leita SQUIBB, DO, Given at 01/17/24 1326   heparin  injection 5,000 Units, 5,000 Units, Subcutaneous, Q8H, Schuh, McKenzi P, PA-C, 5,000 Units at 01/18/24 0634   CBG monitoring, , , 4x Daily, AC & HS **AND** insulin  aspart (novoLOG ) injection 0-24 Units, 0-24 Units, Subcutaneous, TID AC & HS, Lightfoot, Linnie KIDD, MD, 2 Units at 01/18/24 9352   insulin  aspart (novoLOG ) injection 5 Units, 5 Units, Subcutaneous, TID WC, Claudene Toribio BROCKS, MD, 5 Units at 01/18/24 9173   insulin  glargine (LANTUS ) injection 25 Units, 25 Units, Subcutaneous, BID, Claudene Toribio BROCKS, MD, 25 Units at 01/17/24 2119   lidocaine  (LIDODERM ) 5 % 2 patch, 2 patch, Transdermal, Q24H, Lightfoot, Linnie KIDD, MD, 2 patch at 01/16/24 0900   midodrine  (PROAMATINE ) tablet 5 mg, 5 mg, Oral, Q8H PRN, Smith, Daniel C, MD   multivitamin (RENA-VIT) tablet 1 tablet, 1 tablet, Oral, QHS, Gretta Leita SQUIBB, DO, 1 tablet at 01/17/24 2121   ondansetron  (ZOFRAN ) injection 4 mg, 4 mg, Intravenous, Q6H PRN, Schuh, McKenzi P, PA-C   Oral care mouth rinse, 15 mL, Mouth Rinse, PRN, Lightfoot, Harrell O, MD   oxyCODONE  (Oxy IR/ROXICODONE ) immediate release  tablet 2.5 mg, 2.5 mg, Oral, Q3H PRN, Lightfoot, Harrell O, MD, 2.5 mg at 01/17/24 2118   pantoprazole  (PROTONIX ) EC tablet 40 mg, 40 mg, Oral, Daily, Lightfoot, Harrell O, MD, 40 mg at 01/17/24 1324   sodium chloride  flush (NS) 0.9 % injection 10-40 mL, 10-40 mL, Intracatheter, Q12H, Schuh, McKenzi P, PA-C, 10 mL at 01/17/24 2122   sodium chloride  flush (NS)  0.9 % injection 10-40 mL, 10-40 mL, Intracatheter, PRN, Schuh, McKenzi P, PA-C   sodium chloride  flush (NS) 0.9 % injection 3 mL, 3 mL, Intravenous, Q12H, Lightfoot, Harrell O, MD, 3 mL at 01/17/24 2122   sodium chloride  flush (NS) 0.9 % injection 3 mL, 3 mL, Intravenous, PRN, Lightfoot, Linnie KIDD, MD, 3 mL at 01/17/24 2122   traMADol  (ULTRAM ) tablet 50 mg, 50 mg, Oral, Q12H PRN, Lightfoot, Harrell O, MD, 50 mg at 01/15/24 2253   zolpidem  (AMBIEN ) tablet 5 mg, 5 mg, Oral, QHS PRN, Schuh, McKenzi P, PA-C, 5 mg at 01/15/24 2253    Patients Current Diet:  Diet Order                  Diet regular Room service appropriate? Yes with Assist; Fluid consistency: Thin  Diet effective now                         Precautions / Restrictions Precautions Precautions: Fall, Sternal Precaution Booklet Issued: No Precaution/Restrictions Comments: SBP > 100 goal, syncope (delayed ~30 min after sitting up) with nursing 12/13, x2 abdominal binders, TED hose Restrictions Weight Bearing Restrictions Per Provider Order: Yes RUE Weight Bearing Per Provider Order: Non weight bearing LUE Weight Bearing Per Provider Order: Non weight bearing Other Position/Activity Restrictions: Sternal precautions    Has the patient had 2 or more falls or a fall with injury in the past year? No   Prior Activity Level Limited Community (1-2x/wk): independent prior to admit with no AD, driving himself to HD   Prior Functional Level Self Care: Did the patient need help bathing, dressing, using the toilet or eating? Independent   Indoor Mobility: Did the patient need  assistance with walking from room to room (with or without device)? Independent   Stairs: Did the patient need assistance with internal or external stairs (with or without device)? Independent   Functional Cognition: Did the patient need help planning regular tasks such as shopping or remembering to take medications? Independent   Patient Information Are you of Hispanic, Latino/a,or Spanish origin?: A. No, not of Hispanic, Latino/a, or Spanish origin What is your race?: A. White Do you need or want an interpreter to communicate with a doctor or health care staff?: 0. No   Patient's Response To:  Health Literacy and Transportation Is the patient able to respond to health literacy and transportation needs?: Yes Health Literacy - How often do you need to have someone help you when you read instructions, pamphlets, or other written material from your doctor or pharmacy?: Never In the past 12 months, has lack of transportation kept you from medical appointments or from getting medications?: No In the past 12 months, has lack of transportation kept you from meetings, work, or from getting things needed for daily living?: No   Home Assistive Devices / Equipment Home Equipment: Cane - quad   Prior Device Use: Indicate devices/aids used by the patient prior to current illness, exacerbation or injury? None of the above   Current Functional Level Cognition   Orientation Level: Oriented X4    Extremity Assessment (includes Sensation/Coordination)   Upper Extremity Assessment: Generalized weakness  Lower Extremity Assessment: Defer to PT evaluation     ADLs   Overall ADL's : Needs assistance/impaired Eating/Feeding: Set up Grooming: Set up Upper Body Bathing: Minimal assistance Lower Body Bathing: Maximal assistance Upper Body Dressing : Sitting, Moderate assistance Upper Body Dressing Details (indicate cue type and reason):  donning gown on backside Lower Body Dressing: Moderate  assistance Lower Body Dressing Details (indicate cue type and reason): donning TEDs and socks Toilet Transfer: Minimal assistance, +2 for physical assistance, Ambulation (eva walker) Functional mobility during ADLs: Minimal assistance, +2 for physical assistance General ADL Comments: Limited d/t pt's soft BP with EOB     Mobility   Overal bed mobility: Needs Assistance Bed Mobility: Sit to Sidelying Rolling: Min assist Sidelying to sit: Mod assist, +2 for safety/equipment Sit to sidelying: Min assist General bed mobility comments: Cued pt to lean laterally to L side while therapist provided minA to lift legs onto bed     Transfers   Overall transfer level: Needs assistance Equipment used: Rolling walker (2 wheels) Transfers: Sit to/from Stand, Bed to chair/wheelchair/BSC Sit to Stand: Mod assist Bed to/from chair/wheelchair/BSC transfer type:: Step pivot Step pivot transfers: Min assist General transfer comment: ModA to gain balance and power up to stand from recliner with pt complying to his sternal precautions by hugging his heart pillow. verbal and tactile cues provided to extend hips and lift chest for more upright posture. MinA needed for balance and safety to step pivot to R from chair to bed     Ambulation / Gait / Stairs / Wheelchair Mobility   Ambulation/Gait Ambulation/Gait assistance: Editor, Commissioning (Feet): 3 Feet Assistive device: Rolling walker (2 wheels) Gait Pattern/deviations: Step-to pattern, Decreased stride length, Wide base of support, Antalgic General Gait Details: Limited distance due to pt becoming increasingly lightheaded when standing. Pt needed minA to step pivot to R from recliner to bed with RW support. Pt expressed L knee chronic pain/issues with gait Gait velocity: reduced Gait velocity interpretation: <1.31 ft/sec, indicative of household ambulator     Posture / Balance Dynamic Sitting Balance Sitting balance - Comments: static sitting EOB  with CGA for safety Balance Overall balance assessment: Needs assistance Sitting-balance support: No upper extremity supported, Feet supported Sitting balance-Leahy Scale: Fair Sitting balance - Comments: static sitting EOB with CGA for safety Standing balance support: Bilateral upper extremity supported, During functional activity, Reliant on assistive device for balance Standing balance-Leahy Scale: Poor Standing balance comment: reliant on RW and minA     Special considerations/life events  Dialysis: Hemodialysis Monday, Wednesday, and Friday, Skin surgical incision to chest, groin, L neck, pressure injury coccyx medial stage 2, and perineal wound , and Diabetic management yes    Previous Home Environment (from acute therapy documentation) Living Arrangements: Spouse/significant other Available Help at Discharge: Family, Available PRN/intermittently Type of Home: House Home Layout: One level Home Access: Stairs to enter Entrance Stairs-Rails: None Entrance Stairs-Number of Steps: 1 Bathroom Shower/Tub: Engineer, Manufacturing Systems: Handicapped height Home Care Services: No   Discharge Living Setting Plans for Discharge Living Setting: Patient's home, Lives with (comment) (spouse) Type of Home at Discharge: House Discharge Home Layout: One level Discharge Home Access: Stairs to enter Entrance Stairs-Rails: None Entrance Stairs-Number of Steps: 1 Discharge Bathroom Shower/Tub: Tub/shower unit Discharge Bathroom Toilet: Handicapped height Discharge Bathroom Accessibility: Yes How Accessible: Accessible via walker Does the patient have any problems obtaining your medications?: No   Social/Family/Support Systems Patient Roles: Spouse Anticipated Caregiver: Audiological Scientist Anticipated Industrial/product Designer Information: [phone] Ability/Limitations of Caregiver: supervision Caregiver Availability: 24/7 Discharge Plan Discussed with Primary Caregiver: Yes Is Caregiver In  Agreement with Plan?: Yes   Goals Patient/Family Goal for Rehab: PT/OT supervision, SLP n/a Expected length of stay: 14-17 days Additional Information: Discharge plan: home with spouse who can provide  24/7 supervision Pt/Family Agrees to Admission and willing to participate: Yes Program Orientation Provided & Reviewed with Pt/Caregiver Including Roles  & Responsibilities: Yes   Decrease burden of Care through IP rehab admission: n/a   Possible need for SNF placement upon discharge: Not anticipated.  Plan for discharge home with 24/7 supervision from spouse.    Patient Condition: I have reviewed medical records from [facility], spoken with TOC, and patient and spouse. I met with patient at the bedside and discussed via phone for inpatient rehabilitation assessment.  Patient will benefit from ongoing PT and OT, can actively participate in 3 hours of therapy a day 5 days of the week, and can make measurable gains during the admission.  Patient will also benefit from the coordinated team approach during an Inpatient Acute Rehabilitation admission.  The patient will receive intensive therapy as well as Rehabilitation physician, nursing, social worker, and care management interventions.  Due to safety, skin/wound care, disease management, medication administration, pain management, and patient education the patient requires 24 hour a day rehabilitation nursing.  The patient is currently min to mod assist with mobility and basic ADLs.  Discharge setting and therapy post discharge at home with home health is anticipated.  Patient has agreed to participate in the Acute Inpatient Rehabilitation Program and will admit today.   Preadmission Screen Completed By:  Reche FORBES Lowers, PT, DPT 01/18/2024 10:01 AM ______________________________________________________________________   Discussed status with Dr. Lovorn on 01/18/2024  at 1:40 PM  and received approval for admission today.   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 Yes Co-Morbidities requiring supervision/potential complications: TCAR 12/10; CABG 12/8- due ot NSTEMI; ESRD on HD, DM A1c 9.9  Due to bladder management, bowel management, safety, skin/wound care, disease management, medication administration, pain management, and patient education, does the patient require 24 hr/day rehab nursing? Yes Does the patient require coordinated care of a physician, rehab nurse, PT, OT,  to address physical and functional deficits in the context of the above medical diagnosis(es)? Yes Addressing deficits in the following areas: balance, endurance, locomotion, strength, transferring, bowel/bladder control, bathing, dressing, feeding, grooming, and toileting Can the patient actively participate in an intensive therapy program of at least 3 hrs of therapy 5 days a week? Yes The potential for patient to make measurable gains while on inpatient rehab is good Anticipated functional outcomes upon discharge from inpatient rehab: supervision PT, supervision OT, n/a SLP Estimated rehab length of stay to reach the above functional goals is: 14-17 days Anticipated discharge destination: Home 10. Overall Rehab/Functional Prognosis: good     MD Signature:               Revision History  Date/Time User Provider Type Action  01/18/2024  2:06 PM Cornelio Bouchard, MD Physician Sign  01/18/2024  1:45 PM Lowers Reche FORBES, PT Rehab Admission Coordinator Share  01/18/2024  1:40 PM Lowers Reche FORBES, PT Rehab Admission Coordinator Share   View Details Report

## 2024-01-19 ENCOUNTER — Encounter (HOSPITAL_COMMUNITY): Payer: Self-pay | Admitting: Physical Medicine and Rehabilitation

## 2024-01-19 DIAGNOSIS — I6310 Cerebral infarction due to embolism of unspecified precerebral artery: Secondary | ICD-10-CM | POA: Diagnosis not present

## 2024-01-19 LAB — CBC WITH DIFFERENTIAL/PLATELET
Abs Immature Granulocytes: 0.31 K/uL — ABNORMAL HIGH (ref 0.00–0.07)
Basophils Absolute: 0.1 K/uL (ref 0.0–0.1)
Basophils Relative: 1 %
Eosinophils Absolute: 0.6 K/uL — ABNORMAL HIGH (ref 0.0–0.5)
Eosinophils Relative: 5 %
HCT: 25.3 % — ABNORMAL LOW (ref 39.0–52.0)
Hemoglobin: 8 g/dL — ABNORMAL LOW (ref 13.0–17.0)
Immature Granulocytes: 2 %
Lymphocytes Relative: 8 %
Lymphs Abs: 1 K/uL (ref 0.7–4.0)
MCH: 30.7 pg (ref 26.0–34.0)
MCHC: 31.6 g/dL (ref 30.0–36.0)
MCV: 96.9 fL (ref 80.0–100.0)
Monocytes Absolute: 1 K/uL (ref 0.1–1.0)
Monocytes Relative: 8 %
Neutro Abs: 9.7 K/uL — ABNORMAL HIGH (ref 1.7–7.7)
Neutrophils Relative %: 76 %
Platelets: 450 K/uL — ABNORMAL HIGH (ref 150–400)
RBC: 2.61 MIL/uL — ABNORMAL LOW (ref 4.22–5.81)
RDW: 16 % — ABNORMAL HIGH (ref 11.5–15.5)
WBC: 12.8 K/uL — ABNORMAL HIGH (ref 4.0–10.5)
nRBC: 0 % (ref 0.0–0.2)

## 2024-01-19 LAB — RENAL FUNCTION PANEL
Albumin: 3.1 g/dL — ABNORMAL LOW (ref 3.5–5.0)
Anion gap: 17 — ABNORMAL HIGH (ref 5–15)
BUN: 73 mg/dL — ABNORMAL HIGH (ref 8–23)
CO2: 21 mmol/L — ABNORMAL LOW (ref 22–32)
Calcium: 8.9 mg/dL (ref 8.9–10.3)
Chloride: 93 mmol/L — ABNORMAL LOW (ref 98–111)
Creatinine, Ser: 6.41 mg/dL — ABNORMAL HIGH (ref 0.61–1.24)
GFR, Estimated: 9 mL/min — ABNORMAL LOW
Glucose, Bld: 158 mg/dL — ABNORMAL HIGH (ref 70–99)
Phosphorus: 5.6 mg/dL — ABNORMAL HIGH (ref 2.5–4.6)
Potassium: 5.1 mmol/L (ref 3.5–5.1)
Sodium: 131 mmol/L — ABNORMAL LOW (ref 135–145)

## 2024-01-19 LAB — GLUCOSE, CAPILLARY
Glucose-Capillary: 128 mg/dL — ABNORMAL HIGH (ref 70–99)
Glucose-Capillary: 148 mg/dL — ABNORMAL HIGH (ref 70–99)
Glucose-Capillary: 123 mg/dL — ABNORMAL HIGH (ref 70–99)
Glucose-Capillary: 229 mg/dL — ABNORMAL HIGH (ref 70–99)

## 2024-01-19 MED ORDER — INSULIN ASPART 100 UNIT/ML IJ SOLN
7.0000 [IU] | Freq: Three times a day (TID) | INTRAMUSCULAR | Status: DC
  Administered 2024-01-19 – 2024-01-20 (×2): 7 [IU] via SUBCUTANEOUS
  Filled 2024-01-19 (×2): qty 7

## 2024-01-19 MED ORDER — CHLORHEXIDINE GLUCONATE CLOTH 2 % EX PADS
6.0000 | MEDICATED_PAD | Freq: Every day | CUTANEOUS | Status: DC
  Administered 2024-01-19: 6 via TOPICAL

## 2024-01-19 MED ORDER — ALTEPLASE 2 MG IJ SOLR: 2.0000 mg | Freq: Once | INTRAMUSCULAR | Status: DC | PRN

## 2024-01-19 MED ORDER — SODIUM CHLORIDE 0.9% FLUSH
10.0000 mL | Freq: Two times a day (BID) | INTRAVENOUS | Status: DC
  Administered 2024-01-19 – 2024-02-03 (×21): 10 mL

## 2024-01-19 MED ORDER — PENTAFLUOROPROP-TETRAFLUOROETH EX AERO: 1.0000 | INHALATION_SPRAY | CUTANEOUS | Status: DC | PRN

## 2024-01-19 MED ORDER — NEPRO/CARBSTEADY PO LIQD: 237.0000 mL | ORAL | Status: DC | PRN

## 2024-01-19 MED ORDER — LIDOCAINE HCL (PF) 1 % IJ SOLN: 5.0000 mL | INTRAMUSCULAR | Status: DC | PRN

## 2024-01-19 MED ORDER — SODIUM CHLORIDE 0.9% FLUSH: 10.0000 mL | INTRAVENOUS | Status: DC | PRN

## 2024-01-19 MED ORDER — MIDODRINE HCL 5 MG PO TABS
10.0000 mg | ORAL_TABLET | Freq: Three times a day (TID) | ORAL | Status: DC | PRN
  Administered 2024-01-19: 10 mg via ORAL
  Filled 2024-01-19: qty 2

## 2024-01-19 MED ORDER — CHLORHEXIDINE GLUCONATE CLOTH 2 % EX PADS
6.0000 | MEDICATED_PAD | Freq: Two times a day (BID) | CUTANEOUS | Status: DC
  Administered 2024-01-20 (×2): 6 via TOPICAL

## 2024-01-19 MED ORDER — HEPARIN SODIUM (PORCINE) 1000 UNIT/ML IJ SOLN
INTRAMUSCULAR | Status: AC
  Filled 2024-01-19: qty 4

## 2024-01-19 MED ORDER — LIDOCAINE-PRILOCAINE 2.5-2.5 % EX CREA: 1.0000 | TOPICAL_CREAM | CUTANEOUS | Status: DC | PRN

## 2024-01-19 MED ORDER — HEPARIN SODIUM (PORCINE) 1000 UNIT/ML DIALYSIS: 2500.0000 [IU] | Freq: Once | INTRAMUSCULAR | Status: DC

## 2024-01-19 MED ORDER — HEPARIN SODIUM (PORCINE) 1000 UNIT/ML DIALYSIS: 1000.0000 [IU] | INTRAMUSCULAR | Status: DC | PRN

## 2024-01-19 MED ORDER — HEPARIN SODIUM (PORCINE) 1000 UNIT/ML DIALYSIS: 2000.0000 [IU] | INTRAMUSCULAR | Status: DC | PRN

## 2024-01-19 MED ORDER — ANTICOAGULANT SODIUM CITRATE 4% (200MG/5ML) IV SOLN: 5.0000 mL | Status: DC | PRN

## 2024-01-19 NOTE — Progress Notes (Signed)
 " Inpatient Rehabilitation Care Coordinator Assessment and Plan Patient Details  Name: Timothy Holloway MRN: [id number] Date of Birth: 10/05/1955  Today's Date: 01/19/2024  Hospital Problems: Principal Problem:   Embolic stroke [facility])  Past Medical History:  Past Medical History:  Diagnosis Date   Anemia    Cataract    Chronic kidney disease (CKD), stage III (moderate) (HCC)    dr Faythe   Depression    Diabetes mellitus without complication (HCC)    DVT (deep venous thrombosis) (HCC)    as a child   ESRD on dialysis (HCC)    M-W-F   Hyperlipidemia    Hypertension    Impotence    Insomnia    Obesity    Steal syndrome as complication of dialysis access    Past Surgical History:  Past Surgical History:  Procedure Laterality Date   A/V FISTULAGRAM Right 10/11/2022   Procedure: A/V Fistulagram;  Surgeon: Serene Gaile ORN, MD;  Location: [facility] INVASIVE CV LAB;  Service: Cardiovascular;  Laterality: Right;   AV FISTULA PLACEMENT Left 01/11/2022   Procedure: LEFT ARM ARTERIOVENOUS (AV) FISTULA CREATION;  Surgeon: Lanis Fonda BRAVO, MD;  Location: [facility] OR;  Service: Vascular;  Laterality: Left;  PERIPHERAL NERVE BLOCK   AV FISTULA PLACEMENT Right 07/26/2022   Procedure: RIGHT UPPER EXTREMITY ARTERIOVENOUS (AV) FISTULA CREATION;  Surgeon: Oris Krystal FALCON, MD;  Location: [facility] ORS;  Service: Vascular;  Laterality: Right;   CORONARY ARTERY BYPASS GRAFT N/A 01/08/2024   Procedure: CORONARY ARTERY BYPASS GRAFTING (CABG) TIMES 3 USING LEFT INTERNAL MAMMARY ARTERY AND ENDOSCOPICALLY HARVESTED RIGHT GREATER SAPHENOUS VEIN;  Surgeon: Shyrl Linnie KIDD, MD;  Location: [facility] OR;  Service: Open Heart Surgery;  Laterality: N/A;  second case   DIALYSIS/PERMA CATHETER INSERTION Right 10/23/2023   Procedure: DIALYSIS/PERMA CATHETER INSERTION;  Surgeon: Magda Debby SAILOR, MD;  Location: [facility] PV LAB;  Service: Cardiovascular;  Laterality: Right;   INSERTION OF DIALYSIS CATHETER Right 06/16/2022   Procedure: INSERTION  OF RIGHT iNTERNAL Jugular TUNNELED DIALYSIS CATHETER;  Surgeon: Serene Gaile ORN, MD;  Location: [facility] OR;  Service: Vascular;  Laterality: Right;   KNEE SURGERY Left 01-31-2002   LEFT HEART CATH AND CORONARY ANGIOGRAPHY N/A 01/05/2024   Procedure: LEFT HEART CATH AND CORONARY ANGIOGRAPHY;  Surgeon: Jordan, Peter M, MD;  Location: [facility] INVASIVE CV LAB;  Service: Cardiovascular;  Laterality: N/A;   LIGATION OF ARTERIOVENOUS  FISTULA Left 06/16/2022   Procedure: LEFT ARM FISTULA LIGATION;  Surgeon: Serene Gaile ORN, MD;  Location: [facility] OR;  Service: Vascular;  Laterality: Left;   LIGATION OF ARTERIOVENOUS  FISTULA Right 11/08/2022   Procedure: LIGATION of RIGHT RADIOCEPHALIC FISTULA;  Surgeon: Sheree Penne Bruckner, MD;  Location: [facility] OR;  Service: Vascular;  Laterality: Right;   SPINE SURGERY     fusion of L4-L5   TEE WITHOUT CARDIOVERSION N/A 01/08/2024   Procedure: ECHOCARDIOGRAM, TRANSESOPHAGEAL;  Surgeon: Shyrl Linnie KIDD, MD;  Location: [facility] OR;  Service: Open Heart Surgery;  Laterality: N/A;   TRANSCAROTID ARTERY REVASCULARIZATION  Left 01/10/2024   Procedure: TRANSCAROTID ARTERY REVASCULARIZATION (TCAR);  Surgeon: Gretta Bruckner PARAS, MD;  Location: [facility] OR;  Service: Vascular;  Laterality: Left;   UPPER EXTREMITY ANGIOGRAPHY Right 11/03/2022   Procedure: Upper Extremity Angiography;  Surgeon: Gretta Bruckner PARAS, MD;  Location: [facility] INVASIVE CV LAB;  Service: Cardiovascular;  Laterality: Right;   Social History:  reports that he has never smoked. He has never been exposed to tobacco smoke. He does not have any smokeless tobacco history  on file. He reports that he does not drink alcohol and does not use drugs.  Family / Support Systems Marital Status: Married How Long?: 47 years Patient Roles: Spouse, Parent Spouse/Significant Other: [name] Children: 1 son Anticipated Caregiver: Audiological Scientist Ability/Limitations of Caregiver: Supervision Caregiver Availability: 24/7 Family Dynamics:  Limited support  Social History Preferred language: English Religion: [employer] - How often do you need to have someone help you when you read instructions, pamphlets, or other written material from your doctor or pharmacy?: Never Writes: Yes Employment Status: Retired Date Retired/Disabled/Unemployed: 07/2023 Age Retired: 47   Abuse/Neglect Abuse/Neglect Assessment Can Be Completed: Yes Physical Abuse: Denies Verbal Abuse: Denies Sexual Abuse: Denies Exploitation of patient/patient's resources: Denies Self-Neglect: Denies  Patient response to: Social Isolation - How often do you feel lonely or isolated from those around you?: Rarely  Emotional Status Pt's affect, behavior and adjustment status: Adjusting to therapy Recent Psychosocial Issues: None Psychiatric History: None Substance Abuse History: None  Patient / Family Perceptions, Expectations & Goals Pt/Family understanding of illness & functional limitations: Patient/family understanding of illness & functional limitations Premorbid pt/family roles/activities: Active in the community Anticipated changes in roles/activities/participation: None antipated Pt/family expectations/goals: Has realistic expectations  [employer] Agencies: None Premorbid Home Care/DME Agencies: Other (Comment) (Cane) Is the patient able to respond to transportation needs?: Yes In the past 12 months, has lack of transportation kept you from medical appointments or from getting medications?: No In the past 12 months, has lack of transportation kept you from meetings, work, or from getting things needed for daily living?: No  Discharge Planning Living Arrangements: Spouse/significant other Support Systems: Spouse/significant other, Other relatives, Friends/neighbors Type of Residence: Private residence Insurance Resources: [employer] (specify) ([employer] ADVANTAGE / [employer] ADVANTAGE PPO) Financial  Resources: Other (Comment) (Retirement) Financial Screen Referred: Yes Living Expenses: Own Money Management: Patient, Spouse Does the patient have any problems obtaining your medications?: No Home Management: Patient/spouse manage the home Patient/Family Preliminary Plans: Plans to return home with the support of his wife Care Coordinator Anticipated Follow Up Needs: HH/OP Expected length of stay: 14-17 days  Clinical Impression CSW met with patient/family to introduce herself and complete initial assessment. Patient presented to [facility] following Embolic stroke. Patient is able to make needs known. He is recently retired. He has a single cane at home. He does not have a cell-phone - will have to use wife's contact number for [employer] contact for DME. If he has to do OP therapy, he is close to [place] and [address].   There were no further needs or concerns at present. CSW will follow up with family and continue to follow. Will provide patient/family with an update as soon as one becomes available.   Di'Asia  Loreli 01/19/2024, 1:50 PM    "

## 2024-01-19 NOTE — Progress Notes (Signed)
 Patient ID: Timothy Holloway, male   DOB: 22-Apr-1955, 68 y.o.   MRN: [id number]  Statement of service delivered.

## 2024-01-19 NOTE — Progress Notes (Signed)
 Pt arrived to unit for dialysis. Pt's Right internal jugular was accessed for tx with no issues. Pt completed tx and was transported back to his unit with no s/ of distress.  01/19/24 1850  Vitals  Temp 97.7 F (36.5 C)  Pulse Rate 63  Resp (!) 22  BP (!) 119/54  SpO2 96 %  Weight 132.1 kg  Type of Weight Post-Dialysis  Oxygen Therapy  Patient Activity (if Appropriate) In bed  Pulse Oximetry Type Continuous  Oximetry Probe Site Changed No  Post Treatment  Dialyzer Clearance Lightly streaked  Liters Processed 84  Fluid Removed (mL) 2000 mL  Tolerated HD Treatment Yes

## 2024-01-19 NOTE — Progress Notes (Signed)
 " Timothy Holloway Progress Note   Subjective:    Seen in room No c/o's ' Standing wt 134.7kg today  Objective Vitals:   01/18/24 1630 01/18/24 1948 01/19/24 0603 01/19/24 1110  BP: 117/89 (!) 134/44 (!) 130/32   Pulse: 69 68 69   Resp: 18 16 16    Temp: 98.1 F (36.7 C) 98.1 F (36.7 C) 98.1 F (36.7 C)   TempSrc: Oral     SpO2: 97% 100% 96%   Weight: 125.8 kg   134.7 kg  Height: 6' (1.829 m)      Physical Exam General: alert, nad, on RA No JVD Heart: RRR Lungs: CTA bilat  Abdomen: soft, NTND Extremities: trace LE edema Dialysis Access: [facility]  Neuro: alert, nonfocal, ox 3    Dialysis Orders: [facility] MWF 4h B350  133.5kg  TDC  Heparin  2000+ 1400u/hr Calcitriol 1.25 three times per week Mircera 90 mcg q 2 wks (last 12/1)    CXR 12/11-> resolved vasc congestion, improved IS edema   Assessment/Plan: ESRD - usual HD MWF. CRRT used from 12/10 - 12/13 for BP stability in the face of stroke-like symptoms. Stable off of CRRT. Tolerated regular HD Monday and Wednesday. Next HD today.  Volume - vasc congestion gone w/ last CXR. Now on RA. Standing wt today was 134.7kg. UF goal 1.5- 2 L.  BP: sbp's mostly 110- 130 Anemia - Hgb 7- 9 recently. Getting ESA here w/ darbe 100 mcg weekly. No iron with high ferritin.  Transfuse prn NSTEMI - 3V CAD by LHC -> s/p CABG 12/08 Stroke-like symptoms: was waxing/ waning per neuro. There was no acute CVA by imaging, but due to advanced carotid disease by CTA pt underwent L carotid artery stent per [facility] 12/10.  Nutrition - Renal diet with fluid restriction    Myer Fret  MD  [facility] 01/19/2024, 11:41 AM  Recent Labs  Lab 01/15/24 0457 01/16/24 0152 01/17/24 0351  HGB 7.5*  --  7.6*  ALBUMIN  1.9* 2.5*  --   CALCIUM  8.1* 7.9* 8.9  PHOS 4.2  4.0 3.3  --   CREATININE 5.49* 4.20* 5.89*  K 4.6 4.0 4.9    Inpatient medications:  aspirin  EC  81 mg Oral Daily   atorvastatin   80 mg Oral Daily   Chlorhexidine  Gluconate Cloth   6 each Topical Daily   clopidogrel   75 mg Oral Daily   darbepoetin (ARANESP ) injection - DIALYSIS  100 mcg Subcutaneous Q Thu-1800   Gerhardt's butt cream   Topical Daily   [START ON 01/20/2024] heparin   2,500 Units Dialysis Once in dialysis   heparin   5,000 Units Subcutaneous Q8H   insulin  aspart  0-5 Units Subcutaneous QHS   insulin  aspart  0-9 Units Subcutaneous TID WC   insulin  aspart  7 Units Subcutaneous TID WC   insulin  glargine  25 Units Subcutaneous BID   lidocaine   2 patch Transdermal Q24H   magic mouthwash  5 mL Oral QID   multivitamin  1 tablet Oral QHS   nutrition supplement (JUVEN)  1 packet Oral BID BM   pantoprazole   40 mg Oral Daily   senna-docusate  2 tablet Oral BID   sodium chloride  flush  10-40 mL Intracatheter Q12H   zolpidem   5 mg Oral QHS    anticoagulant sodium citrate       acetaminophen , albuterol , alteplase , anticoagulant sodium citrate , bisacodyl , calcium  carbonate, diphenhydrAMINE , feeding supplement (NEPRO CARB STEADY), guaiFENesin -dextromethorphan, heparin , [START ON 01/20/2024] heparin , lidocaine  (PF), lidocaine -prilocaine , melatonin, midodrine , milk and  molasses, mouth rinse, pentafluoroprop-tetrafluoroeth, prochlorperazine  **OR** prochlorperazine  **OR** prochlorperazine , simethicone , sodium chloride , sodium chloride  flush, sorbitol , traMADol          "

## 2024-01-19 NOTE — Evaluation (Signed)
 Physical Therapy Assessment and Plan  Patient Details  Name: Timothy Holloway MRN: [id number] Date of Birth: 1955/09/15  PT Diagnosis: Abnormal posture, Abnormality of gait, Coordination disorder, Difficulty walking, Dizziness and giddiness, Edema, Muscle weakness, and Pain in chest Rehab Potential: Good ELOS: 12-14 days   Today's Date: 01/19/2024 PT Individual Time: 9054-8960 PT Individual Time Calculation (min): 54 min  Today's Date: 01/19/2024 PT Missed Time: 21 Minutes Missed Time Reason: Patient fatigue   Hospital Problem: Principal Problem:   Embolic stroke [facility])   Past Medical History:  Past Medical History:  Diagnosis Date   Anemia    Cataract    Chronic kidney disease (CKD), stage III (moderate) (HCC)    dr Faythe   Depression    Diabetes mellitus without complication (HCC)    DVT (deep venous thrombosis) (HCC)    as a child   ESRD on dialysis (HCC)    M-W-F   Hyperlipidemia    Hypertension    Impotence    Insomnia    Obesity    Steal syndrome as complication of dialysis access    Past Surgical History:  Past Surgical History:  Procedure Laterality Date   A/V FISTULAGRAM Right 10/11/2022   Procedure: A/V Fistulagram;  Surgeon: Serene Gaile ORN, MD;  Location: [facility] INVASIVE CV LAB;  Service: Cardiovascular;  Laterality: Right;   AV FISTULA PLACEMENT Left 01/11/2022   Procedure: LEFT ARM ARTERIOVENOUS (AV) FISTULA CREATION;  Surgeon: Lanis Fonda BRAVO, MD;  Location: [facility] OR;  Service: Vascular;  Laterality: Left;  PERIPHERAL NERVE BLOCK   AV FISTULA PLACEMENT Right 07/26/2022   Procedure: RIGHT UPPER EXTREMITY ARTERIOVENOUS (AV) FISTULA CREATION;  Surgeon: Oris Krystal FALCON, MD;  Location: [facility] ORS;  Service: Vascular;  Laterality: Right;   CORONARY ARTERY BYPASS GRAFT N/A 01/08/2024   Procedure: CORONARY ARTERY BYPASS GRAFTING (CABG) TIMES 3 USING LEFT INTERNAL MAMMARY ARTERY AND ENDOSCOPICALLY HARVESTED RIGHT GREATER SAPHENOUS VEIN;  Surgeon: Shyrl Linnie KIDD, MD;   Location: [facility] OR;  Service: Open Heart Surgery;  Laterality: N/A;  second case   DIALYSIS/PERMA CATHETER INSERTION Right 10/23/2023   Procedure: DIALYSIS/PERMA CATHETER INSERTION;  Surgeon: Magda Debby SAILOR, MD;  Location: [facility] LAB;  Service: Cardiovascular;  Laterality: Right;   INSERTION OF DIALYSIS CATHETER Right 06/16/2022   Procedure: INSERTION OF RIGHT iNTERNAL Jugular TUNNELED DIALYSIS CATHETER;  Surgeon: Serene Gaile ORN, MD;  Location: [facility] OR;  Service: Vascular;  Laterality: Right;   KNEE SURGERY Left 01-31-2002   LEFT HEART CATH AND CORONARY ANGIOGRAPHY N/A 01/05/2024   Procedure: LEFT HEART CATH AND CORONARY ANGIOGRAPHY;  Surgeon: Jordan, Peter M, MD;  Location: [facility] INVASIVE CV LAB;  Service: Cardiovascular;  Laterality: N/A;   LIGATION OF ARTERIOVENOUS  FISTULA Left 06/16/2022   Procedure: LEFT ARM FISTULA LIGATION;  Surgeon: Serene Gaile ORN, MD;  Location: [facility] OR;  Service: Vascular;  Laterality: Left;   LIGATION OF ARTERIOVENOUS  FISTULA Right 11/08/2022   Procedure: LIGATION of RIGHT RADIOCEPHALIC FISTULA;  Surgeon: Sheree Penne Bruckner, MD;  Location: [facility] OR;  Service: Vascular;  Laterality: Right;   SPINE SURGERY     fusion of L4-L5   TEE WITHOUT CARDIOVERSION N/A 01/08/2024   Procedure: ECHOCARDIOGRAM, TRANSESOPHAGEAL;  Surgeon: Shyrl Linnie KIDD, MD;  Location: [facility] OR;  Service: Open Heart Surgery;  Laterality: N/A;   TRANSCAROTID ARTERY REVASCULARIZATION  Left 01/10/2024   Procedure: TRANSCAROTID ARTERY REVASCULARIZATION (TCAR);  Surgeon: Gretta Bruckner PARAS, MD;  Location: [facility] OR;  Service: Vascular;  Laterality: Left;   UPPER EXTREMITY  ANGIOGRAPHY Right 11/03/2022   Procedure: Upper Extremity Angiography;  Surgeon: Gretta Lonni PARAS, MD;  Location: [facility] INVASIVE CV LAB;  Service: Cardiovascular;  Laterality: Right;    Assessment & Plan Clinical Impression: Patient is a 68 y.o. year old male with history of T2DM, ESRD--MWF, vertigo,  HTN who was admitted from his HD center  with chest pain and SOB. He reported few day history of CP and was found to have pulmonary edema as well as elevated troponin and transferred to [facility] for management.  Patient with NSTEMI and underwent cardiac cath by Dr. Jordan revealing severe 3 V CAD with EF 30-35%.  Dr. Shyrl was consulted and felt that revascularization was the best option. He underwent CABG X 4 on 01/08/24. Post op extubated without difficulty but developed sudden on set of right sided weakness with aphasia on 12/09 am. . CT head negative. 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   He had recurrent issues later that day due to hypotension and  Dr. Voncile recommended permissive HTN.  He had recurrent symptoms with lethargy, left gaze preference and severe dysarthria on 12/10.      Carotid dopplers done revealing 80-99% L-ICA stenosis and Dr. Gretta recommended TCAR for management of symptomatic L-CAS. He underwent L-TCAR on 12/10  and started on liquid diet due to moderate dysphagia noted on FEES. Dr. Jerri recommended Plavix  for stroke due to large vessel disease and hoarseness felt to be due to TCAR v/s re-intubation. MRI brain done revealing numerous small acute infarcts scattered throughout supratentorial and infratentorial brain, greatest involvement in left cerebral hemisphere.   He continued to have orthostatic symptoms with syncopal episode on 12/13.  Required CRRT and has been transitioned to HD as BP improved and midodrine  d/c.  He continues to have orthostatic symptoms with activity. TEDs and binder X 2 used for BP support.  PT/OT has been working with patient who requires min to max assist with ADLs and min to mod assist for transfers and able to take few steps. He continues to be limited by weakness and sternal precautions. He was as independent PTA and CIR recommended due to functional decline.   Patient currently requires min with  mobility secondary to muscle weakness, decreased cardiorespiratoy endurance, and decreased standing balance, decreased postural control, decreased balance strategies, and difficulty maintaining precautions.  Prior to hospitalization, patient was independent  with mobility and lived with Spouse in a House home.  Home access is 1+1Stairs to enter.  Patient will benefit from skilled PT intervention to maximize safe functional mobility, minimize fall risk, and decrease caregiver burden for planned discharge home with intermittent assist.  Anticipate patient will benefit from follow up OP at discharge.  PT - End of Session Activity Tolerance: Tolerates 30+ min activity with multiple rests Endurance Deficit: Yes Endurance Deficit Description: SOB with minimal activity, required freuqent rest breaks, and unable to make it through 75 minute evaluation PT Assessment Rehab Potential (ACUTE/IP ONLY): Good PT Barriers to Discharge: Home environment access/layout;Wound Care;Weight;Hemodialysis;Other (comments) PT Barriers to Discharge Comments: sternal precautions, HD, global weakness/deconditioning, poor activity tolerance, 1+1 STE PT Patient demonstrates impairments in the following area(s): Balance;Edema;Endurance;Motor;Nutrition;Pain;Skin Integrity PT Transfers Functional Problem(s): Bed Mobility;Bed to Chair;Car;Furniture PT Locomotion Functional Problem(s): Ambulation;Wheelchair Mobility;Stairs PT Plan PT Intensity: Minimum of 1-2 x/day ,45 to 90 minutes PT Frequency: 5 out of 7 days PT Duration Estimated Length of Stay: 12-14 days PT Treatment/Interventions:  Ambulation/gait training;Discharge planning;Functional mobility training;Psychosocial support;Therapeutic Activities;Balance/vestibular training;Disease management/prevention;Neuromuscular re-education;Skin care/wound management;Therapeutic Exercise;Wheelchair propulsion/positioning;DME/adaptive equipment instruction;Pain management;UE/LE Strength  taining/ROM;Community reintegration;Patient/family education;Stair training;UE/LE Coordination activities PT Transfers Anticipated Outcome(s): Mod I with LRAD PT Locomotion Anticipated Outcome(s): Mod I with LRAD PT Recommendation Follow Up Recommendations: Outpatient PT Patient destination: Home Equipment Recommended: To be determined Equipment Details: has quad cane   PT Evaluation Precautions/Restrictions Precautions Precautions: Fall;Sternal Recall of Precautions/Restrictions: Impaired Precaution/Restrictions Comments: watch BP Restrictions Weight Bearing Restrictions Per Provider Order: No Other Position/Activity Restrictions: Sternal precautions Pain Interference Pain Interference Pain Effect on Sleep: 1. Rarely or not at all Pain Interference with Therapy Activities: 1. Rarely or not at all Pain Interference with Day-to-Day Activities: 1. Rarely or not at all Home Living/Prior Functioning Home Living Available Help at Discharge: Family;Available 24 hours/day (wife's sister lives beside them) Type of Home: House Home Access: Stairs to enter Secretary/administrator of Steps: 1+1 Entrance Stairs-Rails: None Home Layout: One level Bathroom Shower/Tub: Forensic Scientist: Handicapped height Bathroom Accessibility:  (unsure) Additional Comments: has quad cane but wasn't using it  Lives With: Spouse Prior Function Level of Independence: Independent with basic ADLs;Independent with transfers;Independent with homemaking with ambulation;Independent with gait  Able to Take Stairs?: Yes Driving: Yes Vocation: Retired Vision/Perception  Vision - History Ability to See in Adequate Light: 0 Adequate Vision - Assessment Additional Comments: Reports blurred vision was PTA due to need of updated lenses Perception Perception: Within Functional Limits Praxis Praxis: WFL  Cognition Overall Cognitive Status: Within Functional Limits for tasks  assessed Arousal/Alertness: Awake/alert Orientation Level: Oriented X4 Memory: Appears intact Awareness: Appears intact Problem Solving: Appears intact Safety/Judgment: Appears intact Sensation Sensation Light Touch: Appears Intact Hot/Cold: Not tested Proprioception: Appears Intact Stereognosis: Not tested Coordination Gross Motor Movements are Fluid and Coordinated: No Fine Motor Movements are Fluid and Coordinated: No Coordination and Movement Description: global weakness/deconditioning, fatigue/SOB, decreased balance/coordination, and difficulty adhering to sternal precautions Finger Nose Finger Test: Bilateral dysmetria Motor  Motor Motor: Abnormal postural alignment and control Motor - Skilled Clinical Observations: global weakness/deconditioning, fatigue/SOB, decreased balance/coordination, and difficulty adhering to sternal precautions  Trunk/Postural Assessment  Cervical Assessment Cervical Assessment: Within Functional Limits Thoracic Assessment Thoracic Assessment: Exceptions to [facility] (mild throacic rounding) Lumbar Assessment Lumbar Assessment: Exceptions to [facility] (mild posterior pelvic tilt) Postural Control Postural Control: Deficits on evaluation Righting Reactions: delayed (anterior LOB when standing from WC during car transfer)  Balance Balance Balance Assessed: Yes Static Sitting Balance Static Sitting - Balance Support: Feet supported;No upper extremity supported Static Sitting - Level of Assistance: 5: Stand by assistance (supervision) Dynamic Sitting Balance Dynamic Sitting - Balance Support: Feet supported;No upper extremity supported Dynamic Sitting - Level of Assistance: 5: Stand by assistance (supervision) Static Standing Balance Static Standing - Balance Support: No upper extremity supported;During functional activity Static Standing - Level of Assistance: 5: Stand by assistance (CGA) Dynamic Standing Balance Dynamic Standing - Balance Support: No  upper extremity supported;During functional activity Dynamic Standing - Level of Assistance: 4: Min assist Dynamic Standing - Comments: with transfers and ambulation Extremity Assessment  RLE Assessment RLE Assessment: Exceptions to [facility] General Strength Comments: tested sitting in WC RLE Strength Right Hip Flexion: 4-/5 Right Hip ABduction: 4/5 Right Hip ADduction: 4/5 Right Knee Flexion: 4-/5 Right Knee Extension: 4-/5 Right Ankle Dorsiflexion: 4/5 Right Ankle Plantar Flexion: 4/5 LLE Assessment LLE Assessment: Exceptions to [facility] General Strength Comments: tested sitting in WC LLE Strength Left Hip Flexion: 4-/5 Left Hip ABduction: 4/5 Left Hip ADduction: 4/5  Left Knee Flexion: 4-/5 Left Knee Extension: 4-/5 Left Ankle Dorsiflexion: 4/5 Left Ankle Plantar Flexion: 4/5  Care Tool Care Tool Bed Mobility Roll left and right activity   Roll left and right assist level: Minimal Assistance - Patient > 75%    Sit to lying activity   Sit to lying assist level: Minimal Assistance - Patient > 75%    Lying to sitting on side of bed activity   Lying to sitting on side of bed assist level: the ability to move from lying on the back to sitting on the side of the bed with no back support.: Minimal Assistance - Patient > 75%     Care Tool Transfers Sit to stand transfer   Sit to stand assist level: Minimal Assistance - Patient > 75%    Chair/bed transfer   Chair/bed transfer assist level: Minimal Assistance - Patient > 75%    Car transfer   Car transfer assist level: Minimal Assistance - Patient > 75%      Care Tool Locomotion Ambulation   Assist level: Minimal Assistance - Patient > 75% Assistive device: No Device Max distance: 44  Walk 10 feet activity   Assist level: Minimal Assistance - Patient > 75% Assistive device: No Device   Walk 50 feet with 2 turns activity Walk 50 feet with 2 turns activity did not occur: Safety/medical concerns (fatigue, SOB, dizziness,  weakness/deconditioning)      Walk 150 feet activity Walk 150 feet activity did not occur: Safety/medical concerns (fatigue, SOB, dizziness, weakness/deconditioning)      Walk 10 feet on uneven surfaces activity Walk 10 feet on uneven surfaces activity did not occur: Safety/medical concerns (fatigue, SOB, dizziness, weakness/deconditioning)      Stairs Stair activity did not occur: Safety/medical concerns (fatigue, SOB, dizziness, weakness/deconditioning)        Walk up/down 1 step activity Walk up/down 1 step or curb (drop down) activity did not occur: Safety/medical concerns (fatigue, SOB, dizziness, weakness/deconditioning)      Walk up/down 4 steps activity Walk up/down 4 steps activity did not occur: Safety/medical concerns (fatigue, SOB, dizziness, weakness/deconditioning)      Walk up/down 12 steps activity Walk up/down 12 steps activity did not occur: Safety/medical concerns (fatigue, SOB, dizziness, weakness/deconditioning)      Pick up small objects from floor Pick up small object from the floor (from standing position) activity did not occur: Safety/medical concerns (fatigue, SOB, dizziness, weakness/deconditioning)      Wheelchair Is the patient using a wheelchair?: Yes Type of Wheelchair: Manual   Wheelchair assist level: Dependent - Patient 0% Max wheelchair distance: 152ft  Wheel 50 feet with 2 turns activity   Assist Level: Dependent - Patient 0%  Wheel 150 feet activity   Assist Level: Dependent - Patient 0%    Refer to Care Plan for Long Term Goals  SHORT TERM GOAL WEEK 1 PT Short Term Goal 1 (Week 1): pt will perform bed mobility with supervision using bed features PT Short Term Goal 2 (Week 1): pt will perform all transfers with LRAD and CGA PT Short Term Goal 3 (Week 1): pt will ambulate 28ft with LRAD and CGA  Recommendations for other services: None   Skilled Therapeutic Intervention Evaluation completed (see details above and below) with education  on PT POC and goals and individual treatment initiated with focus on functional mobility/transfers, generalized strengthening and endurance, dynamic standing balance/coordination, simulated car transfers, and ambulation. Received pt semi-reclined in bed, pt educated on PT evaluation, CIR policies,  and therapy schedule and agreeable. Pt reported pain in chest and feeling chocked up from being slouched in bed - therapist assisted with repositioning. Pt transferred semi-reclined<>sitting L EOB with HOB elevated and min A for trunk control while holding heart pillow. Pt reported dizziness upon sitting EOB and with remainder of activity.    Performed all transfers without AD and min A throughout session (cues to avoid pushing up with BUE). Pt transported to/from room in [facility] dependently for time management purposes. Pt performed simulated car transfer without AD and min A, then ambulated 31ft with min HHA to mat. Pt limited by SOB and BP: 145/46. Took extended seated rest break then ambulated additional 75ft with min HHA - BP: 142/44. Pt reported 9/10 fatigue and requested to return to room. Pt left seated in WC, needs within reach, and chair pad alarm on. Safety plan updated. Attempted to locate recliner for pt but unable to find one. 21 minutes missed of skilled physical therapy due to fatigue.   Vitals with teds on: BP Supine: 114/38 BP Sitting EOB: 115/24 - symptomatic; performed seated LAQ x10 bilaterally and marches x10 bilaterally then reassessed BP: 130/144 BP Standing EOB: 98/46 - symptomatic  Mobility Bed Mobility Bed Mobility: Rolling Left;Supine to Sit Rolling Left: Minimal Assistance - Patient > 75% Supine to Sit: Minimal Assistance - Patient > 75% Transfers Transfers: Sit to Stand;Stand to Sit;Stand Pivot Transfers Sit to Stand: Minimal Assistance - Patient > 75% Stand to Sit: Minimal Assistance - Patient > 75% Stand Pivot Transfers: Minimal Assistance - Patient > 75% Transfer (Assistive  device): None Locomotion  Gait Ambulation: Yes Gait Assistance: Minimal Assistance - Patient > 75% Gait Distance (Feet): 44 Feet Assistive device: 1 person hand held assist Gait Assistance Details: Verbal cues for precautions/safety Gait Assistance Details: verbal cues for breathing strategies Gait Gait: Yes Gait Pattern: Impaired Gait Pattern: Step-to pattern;Decreased step length - right;Decreased step length - left;Decreased stride length;Poor foot clearance - right;Poor foot clearance - left;Narrow base of support;Decreased trunk rotation;Trunk flexed;Left foot flat;Right foot flat Gait velocity: reduced Stairs / Additional Locomotion Stairs: No Corporate Treasurer: Yes Wheelchair Assistance: Dependent - Patient 0% Wheelchair Parts Management: Needs assistance Distance: 12ft   Discharge Criteria: Patient will be discharged from PT if patient refuses treatment 3 consecutive times without medical reason, if treatment goals not met, if there is a change in medical status, if patient makes no progress towards goals or if patient is discharged from hospital.  The above assessment, treatment plan, treatment alternatives and goals were discussed and mutually agreed upon: by patient  Nyrie Sigal M Zaunegger Davena Julian Zaunegger PT, DPT 01/19/2024, 10:51 AM

## 2024-01-19 NOTE — Progress Notes (Signed)
 "                                                        PROGRESS NOTE   Subjective/Complaints: Orthostatic during therapy today, abdominal binder and teds ordered, midodrine  increased to 10mg  TID, oxycodone  d/ced after discussion with patient  ROS: +orthostasis   Objective:   DG Chest 2 View Result Date: 01/18/2024 EXAM: 2 VIEW(S) XRAY OF THE CHEST 01/18/2024 09:52:00 AM COMPARISON: 01/14/2024 CLINICAL HISTORY: Pleural effusion on left FINDINGS: LINES, TUBES AND DEVICES: Tunneled right IJ hemodialysis catheter stable in place. Feeding tube removed. LUNGS AND PLEURA: Linear atelectasis at left lung base. Small left pleural effusion. Resolved pulmonary edema. No pneumothorax. HEART AND MEDIASTINUM: Stable cardiomegaly. Aortic atherosclerosis (ICD10-170.0). CABG markers noted. BONES AND SOFT TISSUES: No acute osseous abnormality. IMPRESSION: 1. Small left pleural effusion. Resolved pulmonary edema 2. Stable tunneled right internal jugular hemodialysis catheter. 3. Stable cardiomegaly status post CABG. 4. Aortic atherosclerosis. Electronically signed by: Dorethia Molt MD 01/18/2024 07:48 PM EST RP Workstation: [id number]   Recent Labs    01/17/24 0351  WBC 15.4*  HGB 7.6*  HCT 23.7*  PLT 323   Recent Labs    01/17/24 0351  NA 133*  K 4.9  CL 95*  CO2 25  GLUCOSE 125*  BUN 74*  CREATININE 5.89*  CALCIUM  8.9    Intake/Output Summary (Last 24 hours) at 01/19/2024 0957 Last data filed at 01/19/2024 0906 Gross per 24 hour  Intake 702 ml  Output --  Net 702 ml     Wound 01/14/24 0754 Pressure Injury Coccyx Medial Stage 2 -  Partial thickness loss of dermis presenting as a shallow open injury with a red, pink wound bed without slough. (Active)    Physical Exam: Vital Signs Blood pressure (!) 130/32, pulse 69, temperature 98.1 F (36.7 C), resp. rate 16, height 6' (1.829 m), weight 125.8 kg, SpO2 96%. Gen: no distress, normal appearing HEENT: oral mucosa pink and moist,  NCAT Cardio: Reg rate Chest: normal effort, normal rate of breathing Abd: soft, non-distended Ext: no edema Psych: pleasant, normal affect Skin: right calf and midline chest incisions are c/d/i Neuro: Alert and oriented x3, decreased sensation in bilateral feet. 5/5 strength throughout  Assessment/Plan: 1. Functional deficits which require 3+ hours per day of interdisciplinary therapy in a comprehensive inpatient rehab setting. Physiatrist is providing close team supervision and 24 hour management of active medical problems listed below. Physiatrist and rehab team continue to assess barriers to discharge/monitor patient progress toward functional and medical goals  Care Tool:  Bathing    Body parts bathed by patient: Chest, Left arm, Right arm, Abdomen, Front perineal area, Right upper leg, Left upper leg, Face   Body parts bathed by helper: Right lower leg, Left lower leg, Buttocks     Bathing assist Assist Level: Moderate Assistance - Patient 50 - 74%     Upper Body Dressing/Undressing Upper body dressing   What is the patient wearing?: Pull over shirt    Upper body assist Assist Level: Supervision/Verbal cueing    Lower Body Dressing/Undressing Lower body dressing      What is the patient wearing?: Underwear/pull up, Pants     Lower body assist Assist for lower body dressing: Minimal Assistance - Patient > 75%     Toileting Toileting  Toileting assist Assist for toileting: Moderate Assistance - Patient 50 - 74%     Transfers Chair/bed transfer  Transfers assist     Chair/bed transfer assist level: Minimal Assistance - Patient > 75%     Locomotion Ambulation   Ambulation assist              Walk 10 feet activity   Assist           Walk 50 feet activity   Assist           Walk 150 feet activity   Assist           Walk 10 feet on uneven surface  activity   Assist           Wheelchair     Assist                Wheelchair 50 feet with 2 turns activity    Assist            Wheelchair 150 feet activity     Assist          Blood pressure (!) 130/32, pulse 69, temperature 98.1 F (36.7 C), resp. rate 16, height 6' (1.829 m), weight 125.8 kg, SpO2 96%.  Medical Problem List and Plan: 1. 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 Pain: d/c oxycodone , continue tramadol  for moderate pain.              --Continue lidocaine  patch to chest wall for local measures  3. Chronic insomnia: continue ambien   5. Neuropsych/cognition: This patient is capable of making decisions on his own behalf. 6. Skin/Wound Care: Monitor incisions for healing--paint with betadine bid.              --Gerhardt's and foam dressing to partial thickness sacral wound. Juven added to promote wound healing.  7. Fluids/Electrolytes/Nutrition: Strict I/O. 1200 cc Fr added --Labs with HD.  8. Significant Orthostatic hypotension Impacting therapy: TEDs/Binder as continues to have BP drop but asymptomatic per OT notes             --Sitting 113/33-->stand 130/42--> 98/33 after sitting 30 mins.              --also has Hx of vertigo. -increase midodrine  to 10mg  TID 9. 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  10.  T2DM: Hgb A1c- 9.9. Monitor BS ac/hs and use SSI for elevated BS. Add CM restrictions to diet. Used Lantus  50 units PTA             --On Lantus  25 units BID with 5 units TID ac for  meal coverage.  11. ESRD: Off CRRT and back on MWF HD schedule. Midodrine  prn low BP --schedule therapy at the end of the day to help with tolerance of therapy.  12.  Acute on chronic anemia: Hgb now in 7 range. On Epo 13.  Leucocytosis: Has had leucocytosis which continues to fluctuate             --12.2 at admission -->19.0-->12.2-->15.4  01/17/24 on last check             --monitor for fevers and other signs of infection.  14. Constipation: Change dulcolax to senna S 2 bid. Sorbitol  today 15.  Low protein stores: Alb-2.52. Juven added.  16. Odynophagia: Will add MMW 17. Class III Obesity: BMI- 40.1. Educate on diet and exercise to promote overall health and mobility.  18. Atelectasis- Add flutter valve.     LOS: 1 days A FACE TO FACE EVALUATION WAS PERFORMED  Sven SQUIBB Laren Whaling 01/19/2024, 9:57 AM     "

## 2024-01-19 NOTE — Progress Notes (Signed)
 [facility]patient Rehabilitation Center Individual Statement of Services  Patient Name:  Timothy Holloway  Date:  01/19/2024  Welcome to the [facility]patient [facility].  Our goal is to provide you with an individualized program based on your diagnosis and situation, designed to meet your specific needs.  With this comprehensive rehabilitation program, you will be expected to participate in at least 3 hours of rehabilitation therapies Monday-Friday, with modified therapy programming on the weekends.  Your rehabilitation program will include the following services:  Physical Therapy (PT), Occupational Therapy (OT), Speech Therapy (ST), 24 hour per day rehabilitation nursing, Therapeutic Recreaction (TR), Care Coordinator, Rehabilitation Medicine, Nutrition Services, and Pharmacy Services  Weekly team conferences will be held on Wednesday to discuss your progress.  Your Inpatient Rehabilitation Care Coordinator will talk with you frequently to get your input and to update you on team discussions.  Team conferences with you and your family in attendance may also be held.  Expected length of stay: 14-17 days  Overall anticipated outcome: Independent with assistive device   Depending on your progress and recovery, your program may change. Your Inpatient Rehabilitation Care Coordinator will coordinate services and will keep you informed of any changes. Your Inpatient Rehabilitation Care Coordinator's name and contact numbers are listed  below.  The following services may also be recommended but are not provided by the Inpatient Rehabilitation Center:  Driving Evaluations Home Health Rehabiltiation Services Outpatient Rehabilitation Services Vocational Rehabilitation   Arrangements will be made to provide these services after discharge if needed.  Arrangements include referral to agencies that provide these services.  Your insurance has been verified to be: [employer] / [employer]  PPO  Your primary doctor is:  Hemberg, Comer GAILS, NP   Pertinent information will be shared with your doctor and your insurance company.  Inpatient Rehabilitation Care Coordinator:  Di'Asia Loreli SIERRAS [phone] or ELIGAH BRINKS  Information discussed with and copy given to patient by: Waverly Loreli, 01/19/2024, 1:21 PM

## 2024-01-19 NOTE — Progress Notes (Signed)
 Inpatient Rehabilitation  Patient information reviewed and entered into eRehab system by [facility]. Karen Kays., CCC/SLP, PPS Coordinator.  Information including medical coding, functional ability and quality indicators will be reviewed and updated through discharge.

## 2024-01-19 NOTE — Progress Notes (Signed)
 Patient ID: Timothy Holloway, male   DOB: 03/03/1955, 68 y.o.   MRN: [id number] Met with the patient to review current medical situation, post NSTEMI/CABG x3 and left cerebellar CVA post TVAR with Hx ESRD; HD M-W-F with sternal precautions.  Discussed secondary risk management including DM (A1C 9.9), HTN, HLD (LDL 33/Trig 107) on DAPT. Reviewed HH/CMM diet with 1200 CC FR. Reviewed skin care for pressure wound on coccyx. Continue to follow along to address educational needs to facilitate preparation for discharge. Fredericka Barnie NOVAK

## 2024-01-19 NOTE — Progress Notes (Signed)
 Occupational Therapy Session Note  Patient Details  Name: Timothy Holloway MRN: [id number] Date of Birth: 27-Mar-1955  Today's Date: 01/19/2024 OT Individual Time: 1102-1200 OT Individual Time Calculation (min): 58 min    Short Term Goals: Week 1:  OT Short Term Goal 1 (Week 1): Pt will complete LB with AE PRN with CGA OT Short Term Goal 2 (Week 1): Pt will complete toilet transfer with CGA using LRAD OT Short Term Goal 3 (Week 1): Pt will adhere to sternal precautions 90% of the time during functional ADL task with min cues  Skilled Therapeutic Interventions/Progress Updates:  Patient agreeable to participate in OT session. Reports 0/10 pain level. Patient participated in skilled OT session focusing on sit to stand, functional mobility, precautions education, and education on energy conservation. Patient received in room with nephrology MD. Per MD request patient able to obtain standing weight with mod to max to stand on scale from wc height. Patient then able to complete functional mobility with RW for 15 ft. Patient/ nursing request patient stay close to room due to dialysis calling to get patient soon. Patient able to complete bed transfer with min A. Patient then able to complete sit to stands x8  from EOB to CGA from elevated bed with OT slowly lowering bed to increase difficulty. Patient then educated on energy conservation techniques to increase safety and increase ability to complete activities at home. Patient transferred over to NT at end of session for toileting. All needs in reach.   Therapy Documentation Precautions:  Precautions Precautions: Fall, Sternal Recall of Precautions/Restrictions: Impaired Precaution/Restrictions Comments: watch BP Restrictions Weight Bearing Restrictions Per Provider Order: No Other Position/Activity Restrictions: Sternal precautions   Therapy/Group: Individual Therapy  Timothy Holloway 01/19/2024, 1:41 PM

## 2024-01-19 NOTE — Evaluation (Signed)
 Occupational Therapy Assessment and Plan  Patient Details  Name: Timothy Holloway MRN: [id number] Date of Birth: [birth year]  OT Diagnosis: acute pain, muscle weakness (generalized), swelling of limb, and orthostatic hypotension Rehab Potential: Rehab Potential (ACUTE ONLY): Good ELOS: 10-12 days   Today's Date: 01/19/2024 OT Individual Time: 0805-0900 OT Individual Time Calculation (min): 55 min     Hospital Problem: Principal Problem:   Embolic stroke [facility])   Past Medical History:  Past Medical History:  Diagnosis Date   Anemia    Cataract    Chronic kidney disease (CKD), stage III (moderate) (HCC)    dr Faythe   Depression    Diabetes mellitus without complication (HCC)    DVT (deep venous thrombosis) (HCC)    as a child   ESRD on dialysis (HCC)    M-W-F   Hyperlipidemia    Hypertension    Impotence    Insomnia    Obesity    Steal syndrome as complication of dialysis access    Past Surgical History:  Past Surgical History:  Procedure Laterality Date   A/V FISTULAGRAM Right 10/11/2022   Procedure: A/V Fistulagram;  Surgeon: Serene Gaile ORN, MD;  Location: [facility] INVASIVE CV LAB;  Service: Cardiovascular;  Laterality: Right;   AV FISTULA PLACEMENT Left 01/11/2022   Procedure: LEFT ARM ARTERIOVENOUS (AV) FISTULA CREATION;  Surgeon: Lanis Fonda BRAVO, MD;  Location: [facility] OR;  Service: Vascular;  Laterality: Left;  PERIPHERAL NERVE BLOCK   AV FISTULA PLACEMENT Right 07/26/2022   Procedure: RIGHT UPPER EXTREMITY ARTERIOVENOUS (AV) FISTULA CREATION;  Surgeon: Oris Krystal FALCON, MD;  Location: [facility] ORS;  Service: Vascular;  Laterality: Right;   CORONARY ARTERY BYPASS GRAFT N/A 01/08/2024   Procedure: CORONARY ARTERY BYPASS GRAFTING (CABG) TIMES 3 USING LEFT INTERNAL MAMMARY ARTERY AND ENDOSCOPICALLY HARVESTED RIGHT GREATER SAPHENOUS VEIN;  Surgeon: Shyrl Linnie KIDD, MD;  Location: [facility] OR;  Service: Open Heart Surgery;  Laterality: N/A;  second case   DIALYSIS/PERMA CATHETER INSERTION  Right 10/23/2023   Procedure: DIALYSIS/PERMA CATHETER INSERTION;  Surgeon: Magda Debby SAILOR, MD;  Location: [facility] PV LAB;  Service: Cardiovascular;  Laterality: Right;   INSERTION OF DIALYSIS CATHETER Right 06/16/2022   Procedure: INSERTION OF RIGHT iNTERNAL Jugular TUNNELED DIALYSIS CATHETER;  Surgeon: Serene Gaile ORN, MD;  Location: [facility] OR;  Service: Vascular;  Laterality: Right;   KNEE SURGERY Left 01-31-2002   LEFT HEART CATH AND CORONARY ANGIOGRAPHY N/A 01/05/2024   Procedure: LEFT HEART CATH AND CORONARY ANGIOGRAPHY;  Surgeon: Jordan, Peter M, MD;  Location: [facility] INVASIVE CV LAB;  Service: Cardiovascular;  Laterality: N/A;   LIGATION OF ARTERIOVENOUS  FISTULA Left 06/16/2022   Procedure: LEFT ARM FISTULA LIGATION;  Surgeon: Serene Gaile ORN, MD;  Location: [facility] OR;  Service: Vascular;  Laterality: Left;   LIGATION OF ARTERIOVENOUS  FISTULA Right 11/08/2022   Procedure: LIGATION of RIGHT RADIOCEPHALIC FISTULA;  Surgeon: Sheree Penne Bruckner, MD;  Location: [facility] OR;  Service: Vascular;  Laterality: Right;   SPINE SURGERY     fusion of L4-L5   TEE WITHOUT CARDIOVERSION N/A 01/08/2024   Procedure: ECHOCARDIOGRAM, TRANSESOPHAGEAL;  Surgeon: Shyrl Linnie KIDD, MD;  Location: [facility] OR;  Service: Open Heart Surgery;  Laterality: N/A;   TRANSCAROTID ARTERY REVASCULARIZATION  Left 01/10/2024   Procedure: TRANSCAROTID ARTERY REVASCULARIZATION (TCAR);  Surgeon: Gretta Bruckner PARAS, MD;  Location: [facility] OR;  Service: Vascular;  Laterality: Left;   UPPER EXTREMITY ANGIOGRAPHY Right 11/03/2022   Procedure: Upper Extremity Angiography;  Surgeon: Gretta Bruckner PARAS, MD;  Location: [facility] INVASIVE CV LAB;  Service: Cardiovascular;  Laterality: Right;    Assessment & Plan Clinical Impression:  Timothy Holloway is a 68 year old R handed male with history of T2DM, ESRD--MWF, vertigo,  HTN who was admitted from his HD center with chest pain and SOB. He reported few day history of CP and was found to have pulmonary edema  as well as elevated troponin and transferred to [facility] for management.  Patient with NSTEMI and underwent cardiac cath by Dr. Jordan revealing severe 3 V CAD with EF 30-35%.  Dr. Shyrl was consulted and felt that revascularization was the best option. He underwent CABG X 4 on 01/08/24. Post op extubated without difficulty but developed sudden on set of right sided weakness with aphasia on 12/09 am. . CT head negative. 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   He had recurrent issues later that day due to hypotension and  Dr. Voncile recommended permissive HTN.  He had recurrent symptoms with lethargy, left gaze preference and severe dysarthria on 12/10.      Carotid dopplers done revealing 80-99% L-ICA stenosis and Dr. Gretta recommended TCAR for management of symptomatic L-CAS. He underwent L-TCAR on 12/10  and started on liquid diet due to moderate dysphagia noted on FEES. Dr. Jerri recommended Plavix  for stroke due to large vessel disease and hoarseness felt to be due to TCAR v/s re-intubation. MRI brain done revealing numerous small acute infarcts scattered throughout supratentorial and infratentorial brain, greatest involvement in left cerebral hemisphere.   He continued to have orthostatic symptoms with syncopal episode on 12/13.  Required CRRT and has been transitioned to HD as BP improved and midodrine  d/c.  He continues to have orthostatic symptoms with activity. TEDs and binder X 2 used for BP support.  PT/OT has been working with patient who requires min to max assist with ADLs and min to mod assist for transfers and able to take few steps. He continues to be limited by weakness and sternal precautions. He was as independent PTA and CIR recommended due to functional decline.  Patient transferred to CIR on 01/18/2024 .    Patient currently requires mod with basic self-care skills secondary to muscle  weakness, decreased cardiorespiratoy endurance, decreased coordination, and decreased standing balance and difficulty maintaining precautions.  Prior to hospitalization, patient could complete self- care independently.  Patient will benefit from skilled intervention to decrease level of assist with basic self-care skills and increase independence with basic self-care skills prior to discharge home with care partner.  Anticipate patient will require intermittent supervision and follow up outpatient.  OT - End of Session Activity Tolerance: Tolerates < 10 min activity with changes in vital signs Endurance Deficit: Yes Endurance Deficit Description: SOB with minimal activity, frequent breaks needed OT Assessment Rehab Potential (ACUTE ONLY): Good OT Barriers to Discharge: Inaccessible home environment;Home environment access/layout;Wound Care;Hemodialysis OT Patient demonstrates impairments in the following area(s): Balance;Edema;Endurance;Motor;Pain;Skin Integrity OT Basic ADL's Functional Problem(s): Bathing;Dressing;Toileting OT Advanced ADL's Functional Problem(s): None OT Transfers Functional Problem(s): Toilet;Tub/Shower OT Additional Impairment(s): None OT Plan OT Intensity: Minimum of 1-2 x/day, 45 to 90 minutes OT Frequency: 5 out of 7 days OT Duration/Estimated Length of Stay: 10-12 days OT Treatment/Interventions: Balance/vestibular training;Discharge planning;Pain management;Self Care/advanced ADL retraining;Therapeutic Activities;UE/LE Coordination activities;Disease mangement/prevention;Functional mobility training;Patient/family education;Skin care/wound managment;Therapeutic Exercise;DME/adaptive equipment instruction OT Self Feeding Anticipated Outcome(s): Independent OT Basic Self-Care Anticipated Outcome(s): Mod I  OT Toileting Anticipated Outcome(s): Mod I OT Bathroom Transfers Anticipated Outcome(s): Mod I OT Recommendation Recommendations for Other Services: Therapeutic  Recreation consult Therapeutic Recreation Interventions: Pet therapy Patient destination: Home Follow Up Recommendations: Outpatient OT Equipment Recommended: To be determined   OT Evaluation Precautions/Restrictions  Precautions Precautions: Fall;Sternal Recall of Precautions/Restrictions: Impaired Precaution/Restrictions Comments: watch BP Restrictions Weight Bearing Restrictions Per Provider Order: No Other Position/Activity Restrictions: Sternal precautions Home Living/Prior Functioning Home Living Available Help at Discharge: Family, Available PRN/intermittently (wife's sister lives beside them) Type of Home: House Home Access: Stairs to enter Secretary/administrator of Steps: 1-2 Entrance Stairs-Rails: None Home Layout: One level Bathroom Shower/Tub: Tub/shower unit, Engineer, Building Services: Handicapped height Bathroom Accessibility: Yes (unsure per pt) Additional Comments: has quad cane available  Lives With: Spouse IADL History Homemaking Responsibilities: No Current License: Yes Occupation: Retired Type of Occupation: Retired in May from [employer]? Leisure and Hobbies: Sports- football, racing Prior Function Level of Independence: Independent with basic ADLs, Independent with transfers, Independent with homemaking with ambulation, Independent with gait  Able to Take Stairs?: Yes Driving: Yes Vocation: Retired Administrator, Sports Baseline Vision/History: 0 No visual deficits Ability to See in Adequate Light: 0 Adequate Patient Visual Report: No change from baseline Vision Assessment?: Wears glasses for reading;No apparent visual deficits Additional Comments: Reports blurred vision was PTA due to need of updated lenses Perception  Perception: Within Functional Limits Praxis Praxis: WFL Cognition Cognition Overall Cognitive Status: Within Functional Limits for tasks assessed Arousal/Alertness: Awake/alert Orientation Level: Person;Place;Situation Person: Oriented Place:  Oriented Situation: Oriented Memory: Appears intact Awareness: Appears intact Problem Solving: Appears intact Safety/Judgment: Appears intact Brief Interview for Mental Status (BIMS) Repetition of Three Words (First Attempt): 3 Temporal Orientation: Year: Correct Temporal Orientation: Month: Accurate within 5 days Temporal Orientation: Day: Correct Recall: Sock: Yes, no cue required Recall: Blue: Yes, no cue required Recall: Bed: Yes, no cue required BIMS Summary Score: 15 Sensation Sensation Light Touch: Appears Intact Hot/Cold: Not tested Proprioception: Appears Intact Stereognosis: Not tested Coordination Gross Motor Movements are Fluid and Coordinated: No Fine Motor Movements are Fluid and Coordinated: No Finger Nose Finger Test: Bilateral dysmetria Motor  Motor Motor: Abnormal postural alignment and control Motor - Skilled Clinical Observations: global weakness/deconditioning, fatigue/SOB, decreased balance/coordination, and difficulty adhering to sternal precautions  Trunk/Postural Assessment  Cervical Assessment Cervical Assessment: Within Functional Limits Thoracic Assessment Thoracic Assessment: Exceptions to [facility] (mild throacic rounding) Lumbar Assessment Lumbar Assessment: Exceptions to [facility] (mild posterior pelvic tilt) Postural Control Postural Control: Deficits on evaluation Righting Reactions: delayed (anterior LOB when standing from WC during car transfer)  Balance Balance Balance Assessed: Yes Static Sitting Balance Static Sitting - Balance Support: Feet supported;No upper extremity supported Static Sitting - Level of Assistance: 5: Stand by assistance (supervision) Dynamic Sitting Balance Dynamic Sitting - Balance Support: Feet supported;No upper extremity supported Dynamic Sitting - Level of Assistance: 5: Stand by assistance (supervision) Static Standing Balance Static Standing - Balance Support: No upper extremity supported;During functional  activity Static Standing - Level of Assistance: 5: Stand by assistance (CGA) Dynamic Standing Balance Dynamic Standing - Balance Support: No upper extremity supported;During functional activity Dynamic Standing - Level of Assistance: 4: Min assist Dynamic Standing - Comments: with transfers and ambulation Extremity/Trunk Assessment RUE Assessment RUE Assessment: Exceptions to [facility] General Strength Comments: limited 2/2 sternal px LUE Assessment LUE Assessment: Exceptions to [facility] General Strength Comments: limited 2/2 sternal px  Care Tool Care Tool Self Care Eating   Eating Assist Level: Set up assist  Oral Care    Oral Care Assist Level: Set up assist    Bathing   Body parts bathed by patient: Chest;Left arm;Right arm;Abdomen;Front perineal area;Right upper leg;Left upper leg;Face Body parts bathed by helper: Right lower leg;Left lower leg;Buttocks   Assist Level: Moderate Assistance - Patient 50 - 74%    Upper Body Dressing(including orthotics)   What is the patient wearing?: Pull over shirt   Assist Level: Supervision/Verbal cueing    Lower Body Dressing (excluding footwear)   What is the patient wearing?: Underwear/pull up;Pants Assist for lower body dressing: Minimal Assistance - Patient > 75%    Putting on/Taking off footwear   What is the patient wearing?: Ted hose;Non-skid slipper socks Assist for footwear: Dependent - Patient 0%       Care Tool Toileting Toileting activity   Assist for toileting: Moderate Assistance - Patient 50 - 74%     Care Tool Bed Mobility Roll left and right activity        Sit to lying activity   Sit to lying assist level: Minimal Assistance - Patient > 75%    Lying to sitting on side of bed activity         Care Tool Transfers Sit to stand transfer   Sit to stand assist level: Minimal Assistance - Patient > 75%    Chair/bed transfer   Chair/bed transfer assist level: Minimal Assistance - Patient > 75%     Toilet  transfer   Assist Level: Minimal Assistance - Patient > 75%     Care Tool Cognition  Expression of Ideas and Wants Expression of Ideas and Wants: 4. Without difficulty (complex and basic) - expresses complex messages without difficulty and with speech that is clear and easy to understand  Understanding Verbal and Non-Verbal Content Understanding Verbal and Non-Verbal Content: 4. Understands (complex and basic) - clear comprehension without cues or repetitions   Memory/Recall Ability Memory/Recall Ability : Current season;That he or she is in a hospital/hospital unit   Refer to Care Plan for Long Term Goals  SHORT TERM GOAL WEEK 1 OT Short Term Goal 1 (Week 1): Pt will complete LB with AE PRN with CGA OT Short Term Goal 2 (Week 1): Pt will complete toilet transfer with CGA using LRAD OT Short Term Goal 3 (Week 1): Pt will adhere to sternal precautions 90% of the time during functional ADL task with min cues  Recommendations for other services: Therapeutic Recreation  Pet therapy   Skilled Therapeutic Intervention Patient received sitting EOB upon therapy arrival and agreeable to participate in OT evaluation. Education provided on OT purpose, therapy schedule, goals for therapy, and safety policy while in rehab. No pain reported.  Completed ADLs at EOB, pt declined bathing. Able to stand with min A no AD, but had mild posterior LOB requiring CGA. Fatigue expressed and dizziness with increased activity despite TED. Education provided on additional measures potentially needed to manage BP. Nurse and MD made aware of BP status. Vitals assessed below. Pt deferred OOB mobility 2/2 dizziness. Cues needed throughout for sternal precautions, however pt able to verbalize them accurately. Pt remained semi upright in bed at conclusion of session with bed alarm on and all needs met at end of session.  Patient demonstrates generalized weakness, cardiovascular respiratory endurance, dynamic standing  balance, GMC deficits resulting in difficulty completing BADL tasks without increased physical assist. See below for further ADL and functional transfer performance. Pt will benefit from skilled OT services to focus on mentioned  deficits.  Vitals BP 138/34 (sitting EOB after an hour prior to therapy), HR 67 bpm BP 127/33 (after seated dressing with TEDs applied), HR 65 bpm BP 119/32 (after standing), HR 68 bpm BP 121/38 (sitting for 5 mins), HR 64 bpm BP 112/28 (semi upright in bed)      ADL ADL Eating: Set up Where Assessed-Eating: Edge of bed Grooming: Setup Where Assessed-Grooming: Edge of bed Upper Body Bathing: Supervision/safety Where Assessed-Upper Body Bathing: Edge of bed Lower Body Bathing: Moderate assistance Where Assessed-Lower Body Bathing: Edge of bed Upper Body Dressing: Supervision/safety Where Assessed-Upper Body Dressing: Edge of bed Lower Body Dressing: Moderate assistance Where Assessed-Lower Body Dressing: Edge of bed Toileting: Moderate assistance Where Assessed-Toileting: Bedside Commode Toilet Transfer: Minimal assistance Toilet Transfer Method: Stand pivot Toilet Transfer Equipment: Bedside commode Tub/Shower Transfer: Unable to assess Tub/Shower Transfer Method: Unable to assess Film/video Editor Method: Unable to assess Mobility  Bed Mobility Bed Mobility: Rolling Left;Supine to Sit Rolling Left: Minimal Assistance - Patient > 75% Supine to Sit: Minimal Assistance - Patient > 75% Transfers Sit to Stand: Minimal Assistance - Patient > 75% Stand to Sit: Minimal Assistance - Patient > 75%   Discharge Criteria: Patient will be discharged from OT if patient refuses treatment 3 consecutive times without medical reason, if treatment goals not met, if there is a change in medical status, if patient makes no progress towards goals or if patient is discharged from hospital.  The above assessment, treatment plan, treatment alternatives and goals  were discussed and mutually agreed upon: by patient  Lorrayne FORBES Fritter, MS, OTR/L  01/19/2024, 12:21 PM

## 2024-01-19 NOTE — Plan of Care (Signed)
" °  Problem: Education: Goal: Ability to describe self-care measures that may prevent or decrease complications (Diabetes Survival Skills Education) will improve Outcome: Progressing   Problem: Coping: Goal: Ability to adjust to condition or change in health will improve Outcome: Progressing   Problem: Fluid Volume: Goal: Ability to maintain a balanced intake and output will improve Outcome: Progressing   Problem: Health Behavior/Discharge Planning: Goal: Ability to manage health-related needs will improve Outcome: Progressing   Problem: Nutritional: Goal: Maintenance of adequate nutrition will improve Outcome: Progressing   Problem: Nutritional: Goal: Progress toward achieving an optimal weight will improve Outcome: Progressing   Problem: Skin Integrity: Goal: Risk for impaired skin integrity will decrease Outcome: Progressing   "

## 2024-01-19 NOTE — Consult Note (Addendum)
 WOC Nurse Consult Note: Reason for Consult: requested to assess a unstageable PI to coccyx. Performed remotely evaluation. Wound type: Unstageable PI on coccyx. Pressure Injury POA: Yes Measurement: 2.4 x 5.8 x 0.1 cm (nursing flowsheet) Wound bed: 100% dry yellow slough. Drainage (amount, consistency, odor) scant amount, serous. Periwound: intact Dressing procedure/placement/frequency: Cleanse with saline, pat dry. Apply Santyl to the wound bed daily. To with silicone foam dressing, change the foam every 3 days if not saturated or soiled. Ok to lift and reapply.    WOC team will not plan to follow further. Please reconsult if further assistance is needed. Thank-you,  Lela Holm MSN, RN, CWCN, CNS.  (Phone [phone])

## 2024-01-19 NOTE — Progress Notes (Signed)
 Inpatient Rehabilitation Admission Medication Review by a Pharmacist  A complete drug regimen review was completed for this patient to identify any potential clinically significant medication issues.  High Risk Drug Classes Is patient taking? Indication by Medication  Antipsychotic Yes Compazine  prn N/V  Anticoagulant Yes Sq heparin  - VTE ppx  Antibiotic No   Opioid Yes Oxycodone  prn severe pain Tramadol  prn moderate pain  Antiplatelet Yes Aspirin , clopidogrel  - CAD, s/p CABG  Hypoglycemics/insulin  Yes Insulin  - DM  Vasoactive Medication Yes Midodrine  prn low BP  Chemotherapy No   Other Yes Atorvastatin  - HLD Darbepoetin - anemia Lidocaine  patch - pain Magic mouthwash - pain, infection Pantoprazole  - Reflux  Zolpidem  - sleep Albuterol  prn SOB     Type of Medication Issue Identified Description of Issue Recommendation(s)  Drug Interaction(s) (clinically significant)     Duplicate Therapy     Allergy     No Medication Administration End Date     Incorrect Dose     Additional Drug Therapy Needed     Significant med changes from prior encounter (inform family/care partners about these prior to discharge).    Other       Clinically significant medication issues were identified that warrant physician communication and completion of prescribed/recommended actions by midnight of the next day:  No  Name of provider notified for urgent issues identified:   Provider Method of Notification:     Pharmacist comments:   Time spent performing this drug regimen review (minutes):  20 minutes  Thank you. Olam Monte, PharmD

## 2024-01-19 NOTE — Plan of Care (Signed)
" °  Problem: RH Balance Goal: LTG Patient will maintain dynamic standing with ADLs (OT) Description: LTG:  Patient will maintain dynamic standing balance with assist during activities of daily living (OT)  Flowsheets (Taken 01/19/2024 1223) LTG: Pt will maintain dynamic standing balance during ADLs with: Independent with assistive device   Problem: Sit to Stand Goal: LTG:  Patient will perform sit to stand in prep for activites of daily living with assistance level (OT) Description: LTG:  Patient will perform sit to stand in prep for activites of daily living with assistance level (OT) Flowsheets (Taken 01/19/2024 1223) LTG: PT will perform sit to stand in prep for activites of daily living with assistance level: Independent with assistive device   Problem: RH Bathing Goal: LTG Patient will bathe all body parts with assist levels (OT) Description: LTG: Patient will bathe all body parts with assist levels (OT) Flowsheets (Taken 01/19/2024 1223) LTG: Pt will perform bathing with assistance level/cueing: Independent with assistive device    Problem: RH Dressing Goal: LTG Patient will perform upper body dressing (OT) Description: LTG Patient will perform upper body dressing with assist, with/without cues (OT). Flowsheets (Taken 01/19/2024 1223) LTG: Pt will perform upper body dressing with assistance level of: Independent with assistive device Goal: LTG Patient will perform lower body dressing w/assist (OT) Description: LTG: Patient will perform lower body dressing with assist, with/without cues in positioning using equipment (OT) Flowsheets (Taken 01/19/2024 1223) LTG: Pt will perform lower body dressing with assistance level of: Independent with assistive device   Problem: RH Toileting Goal: LTG Patient will perform toileting task (3/3 steps) with assistance level (OT) Description: LTG: Patient will perform toileting task (3/3 steps) with assistance level (OT)  Flowsheets (Taken 01/19/2024  1223) LTG: Pt will perform toileting task (3/3 steps) with assistance level: Independent with assistive device   Problem: RH Toilet Transfers Goal: LTG Patient will perform toilet transfers w/assist (OT) Description: LTG: Patient will perform toilet transfers with assist, with/without cues using equipment (OT) Flowsheets (Taken 01/19/2024 1223) LTG: Pt will perform toilet transfers with assistance level of: Independent with assistive device   "

## 2024-01-20 LAB — GLUCOSE, CAPILLARY
Glucose-Capillary: 83 mg/dL (ref 70–99)
Glucose-Capillary: 62 mg/dL — ABNORMAL LOW (ref 70–99)
Glucose-Capillary: 62 mg/dL — ABNORMAL LOW (ref 70–99)
Glucose-Capillary: 93 mg/dL (ref 70–99)
Glucose-Capillary: 127 mg/dL — ABNORMAL HIGH (ref 70–99)
Glucose-Capillary: 155 mg/dL — ABNORMAL HIGH (ref 70–99)

## 2024-01-20 MED ORDER — CHLORHEXIDINE GLUCONATE CLOTH 2 % EX PADS
6.0000 | MEDICATED_PAD | Freq: Every day | CUTANEOUS | Status: DC
  Administered 2024-01-21 – 2024-01-22 (×2): 6 via TOPICAL

## 2024-01-20 MED ORDER — MIDODRINE HCL 5 MG PO TABS
2.5000 mg | ORAL_TABLET | Freq: Three times a day (TID) | ORAL | Status: DC
  Administered 2024-01-20 – 2024-01-21 (×4): 2.5 mg via ORAL
  Filled 2024-01-20 (×4): qty 1

## 2024-01-20 NOTE — Progress Notes (Signed)
 Orthopedic Tech Progress Note Patient Details:  Timothy Holloway 1955/05/02 [id number] Dropped off abdominal binder in rehab room and PT applied device per order.  Ortho Devices Type of Ortho Device: Abdominal binder Ortho Device/Splint Interventions: Ordered, Application, Adjustment   Post Interventions Patient Tolerated: Well Instructions Provided: Adjustment of device, Care of device, Poper ambulation with device  [name] 01/20/2024, 10:07 AM

## 2024-01-20 NOTE — Progress Notes (Signed)
 Occupational Therapy Session Note  Patient Details  Name: Timothy Holloway MRN: [id number] Date of Birth: May 27, 1955  Today's Date: 01/20/2024 OT Individual Time: 1100-1200 OT Individual Time Calculation (min): 60 min    Short Term Goals: Week 1:  OT Short Term Goal 1 (Week 1): Pt will complete LB with AE PRN with CGA OT Short Term Goal 2 (Week 1): Pt will complete toilet transfer with CGA using LRAD OT Short Term Goal 3 (Week 1): Pt will adhere to sternal precautions 90% of the time during functional ADL task with min cues  Skilled Therapeutic Interventions/Progress Updates:   Patient agreeable to participate in OT session. Reports no pain level.   Patient participated in skilled OT session focusing on functional mobility, functional activity tolerance, standing balance,  LE strengthening. Patient received in chair ready for OT. Patient had complaints of dizziness and increased fatigue/ tiredness limiting participation in skilled OT. Patient transported to gym. Able to complete Nustep legs only for 2x4 min on level 3 to increase cardiovascular activity tolerance, with functional rest required due to fatigue. Patient had increased complaints of dizziness following requiring increased rest times. Patient then able to complete LE strengthening with 5# ankle weights for hip flexion, knee flexion, knee extension seated on edge of mat table 2x15 reps each. Patient then able to complete UE NMR/ exercise to increase gross motor coordination in UE. Nursing notified of patient on going dizziness and fatigue. Gave patient cold water marked in intake which he reported helped. Patient then able to complete functional mobility 50 ft to room, sat on EOB with dizziness remaining stable. Returned to supine all needs in reach alarm on.   Therapy Documentation Precautions:  Precautions Precautions: Fall, Sternal Recall of Precautions/Restrictions: Impaired Precaution/Restrictions Comments: watch  BP Restrictions Weight Bearing Restrictions Per Provider Order: No Other Position/Activity Restrictions: Sternal precautions Therapy/Group: Individual Therapy  D'mariea L Neel Buffone 01/20/2024, 7:29 AM

## 2024-01-20 NOTE — Progress Notes (Signed)
 Physical Therapy Session Note  Patient Details  Name: Timothy Holloway MRN: [id number] Date of Birth: [birth year]  Today's Date: 01/20/2024 PT Individual Time: 9082-8984 and 1300-1345 PT Individual Time Calculation (min): 58 min and 45 min.  Short Term Goals: Week 1:  PT Short Term Goal 1 (Week 1): pt will perform bed mobility with supervision using bed features PT Short Term Goal 2 (Week 1): pt will perform all transfers with LRAD and CGA PT Short Term Goal 3 (Week 1): pt will ambulate 38ft with LRAD and CGA  Skilled Therapeutic Interventions/Progress Updates:   First session:  Pt presents sitting EOB and agreeable to therapy.  TED hose donned w/ Total A.  Nursing notified of no available abd binder.  BP monitored at 114/50, HR 57  in sitting.  Pt transfers from elevated bed w/ min A and cues for sternal precautions, breathing technique.    BP in standing at 112/49, HR 69 w/ c/o dizziness.  Pt stepped to w/c w/ min A. Pt performed calf raises 4 x 15, LAQ 3 x 10, hip flexion 3 x 8 and then attempted calf raises in standing.  Pt wheeled to main gym for energy conservation.  Pt performed standing balance w/o AD x 2 but then requires seated rest break 2/2 c/o dizziness 6/10.  BP monitored s/p standing 122/67, HR 56.  Abd binder delivered and applied.  Pt states improvement w/ binder but returns to room and remains sitting in w/c w/ seat alarm on and all needs in reach.  Second session:  Pt presents sitting EOB and agreeable to therapy.  Pt states that BG was low this AM, and that's why was dizzy.  Abd binder was donned w/ total A and pt transferred sit to stand w/ min A, rocking technique and step-pivot to w/c.  Pt wheeled to small gym.  Pt transfers sit to stand  throughout session w/ min A and cues for sequencing for forward scoot and lean and breathing technique.  Pt amb x 20' around cone w/ cues for increased foot clearance and step length.  Pt performed x 3 w/ extended seated rest breaks  required 2/2 SOB, although O2 at 98% and HR of 67.  Pt amb up ramp to top and sat in arm chair then amb back to w/c.  Pt returned to room and wished to remain sitting in w/c.  Seat alarm on and all needs in reach.     Therapy Documentation Precautions:  Precautions Precautions: Fall, Sternal Recall of Precautions/Restrictions: Impaired Precaution/Restrictions Comments: watch BP Restrictions Weight Bearing Restrictions Per Provider Order: No Other Position/Activity Restrictions: Sternal precautions General:   Vital Signs:   Pain:0/10, 0/10 c/o pain. Pain Assessment Pain Scale: 0-10 Pain Score: 0-No pain    Therapy/Group: Individual Therapy  Kynadie Yaun P Cullin Dishman 01/20/2024, 10:16 AM

## 2024-01-20 NOTE — Progress Notes (Signed)
 Occupational Therapy Session Note  Patient Details  Name: Timothy Holloway MRN: [id number] Date of Birth: Apr 22, 1955  Today's Date: 01/20/2024 OT Individual Time: 8554-8484 OT Individual Time Calculation (min): 30 min  and Today's Date: 01/20/2024 OT Missed Time: 15 Minutes Missed Time Reason: Patient fatigue   Short Term Goals: Week 1:  OT Short Term Goal 1 (Week 1): Pt will complete LB with AE PRN with CGA OT Short Term Goal 2 (Week 1): Pt will complete toilet transfer with CGA using LRAD OT Short Term Goal 3 (Week 1): Pt will adhere to sternal precautions 90% of the time during functional ADL task with min cues  Skilled Therapeutic Interventions/Progress Updates:  Skilled OT intervention completed with focus on ADL retraining, functional mobility. Pt received seated in w/c, agreeable to session. No pain reported.  Discussed with pt that full shower isn't permitted due to HD port but sponge bath is option and pt agreeable. Pt was able to wash UB with set up A, but min A for UB dress due to tight fit. Discussed getting larger shirts to promote adherence and efficiency with sternal precautions. Set up A oral care.   Pt expressed fatigue from earlier therapies and requested to end session early. Pt required 2 attempts to stand from w/c but good adherence to sternal precautions and min A due to low seat. CGA ambulatory transfer with rollator > EOB. Mod A sit > supine bed mobility.  Pt remained semi upright in bed, with bed alarm on/activated, and with all needs in reach at end of session. Pt missed 15 mins of OT intervention secondary to fatigue; OT will make up missed time as able.    Therapy Documentation Precautions:  Precautions Precautions: Fall, Sternal Recall of Precautions/Restrictions: Impaired Precaution/Restrictions Comments: watch BP Restrictions Weight Bearing Restrictions Per Provider Order: No Other Position/Activity Restrictions: Sternal  precautions   Therapy/Group: Individual Therapy  Lorrayne FORBES Fritter, MS, OTR/L  01/20/2024, 3:26 PM

## 2024-01-20 NOTE — Progress Notes (Signed)
 " [facility] Progress Note   Subjective:    Seen and examined patient at bedside. Tolerated yesterday's HD with net UF 2L. He reports having some dizziness which was due to low blood sugars. Blood pressures appear stable per trend. Next HD tomorrow per the HD holiday schedule.  Objective Vitals:   01/19/24 1956 01/20/24 0607 01/20/24 0608 01/20/24 1352  BP: (!) 144/44  122/81 (!) 140/104  Pulse: 66  65 64  Resp: 19  18 18   Temp: 97.7 F (36.5 C)  (!) 97.5 F (36.4 C) (!) 97.4 F (36.3 C)  TempSrc: Oral  Oral Oral  SpO2: 95%  97% 98%  Weight:  125.1 kg    Height:       Physical Exam General: Awake, alert, NAD Heart: S1 and S2; No murmurs, gallops, or rubs Lungs: Clear throughout Abdomen: Soft and non-tender Extremities: No LE edema Dialysis Access: [facility]   Filed Weights   01/19/24 1504 01/19/24 1850 01/20/24 0607  Weight: 134.3 kg 132.1 kg 125.1 kg    Intake/Output Summary (Last 24 hours) at 01/20/2024 1558 Last data filed at 01/20/2024 1220 Gross per 24 hour  Intake 712 ml  Output 2000 ml  Net -1288 ml    Additional Objective Labs: Basic Metabolic Panel: Recent Labs  Lab 01/15/24 0457 01/16/24 0152 01/17/24 0351 01/19/24 1152  NA 136 133* 133* 131*  K 4.6 4.0 4.9 5.1  CL 105 97* 95* 93*  CO2 21* 26 25 21*  GLUCOSE 92 151* 125* 158*  BUN 79* 50* 74* 73*  CREATININE 5.49* 4.20* 5.89* 6.41*  CALCIUM  8.1* 7.9* 8.9 8.9  PHOS 4.2  4.0 3.3  --  5.6*   Liver Function Tests: Recent Labs  Lab 01/15/24 0457 01/16/24 0152 01/19/24 1152  ALBUMIN  1.9* 2.5* 3.1*   No results for input(s): LIPASE, AMYLASE in the last 168 hours. CBC: Recent Labs  Lab 01/14/24 0431 01/15/24 0457 01/17/24 0351 01/19/24 1152  WBC 14.5* 15.9* 15.4* 12.8*  NEUTROABS  --   --   --  9.7*  HGB 7.6* 7.5* 7.6* 8.0*  HCT 24.1* 24.2* 23.7* 25.3*  MCV 97.6 98.0 96.3 96.9  PLT 255 294 323 450*   Blood Culture    Component Value Date/Time   SDES BLOOD BLOOD  RIGHT HAND 11/11/2022 1145   SDES BLOOD BLOOD RIGHT ARM 11/11/2022 1145   SPECREQUEST NONE 11/11/2022 1145   SPECREQUEST NONE 11/11/2022 1145   CULT  11/11/2022 1145    NO GROWTH 5 DAYS Performed at [facility], [address], [place], [place] [address]    CULT  11/11/2022 1145    NO GROWTH 5 DAYS Performed at [facility], [address] [place], [place] [address]    REPTSTATUS 11/16/2022 FINAL 11/11/2022 1145   REPTSTATUS 11/16/2022 FINAL 11/11/2022 1145    Cardiac Enzymes: No results for input(s): CKTOTAL, CKMB, CKMBINDEX, TROPONINI in the last 168 hours. CBG: Recent Labs  Lab 01/19/24 2207 01/20/24 0606 01/20/24 1211 01/20/24 1231 01/20/24 1256  GLUCAP 229* 83 62* 62* 93   Iron  Studies: No results for input(s): IRON , TIBC, TRANSFERRIN, FERRITIN in the last 72 hours. Lab Results  Component Value Date   INR 1.3 (H) 01/08/2024   INR 1.1 01/07/2024   INR 1.1 11/11/2022   Studies/Results: No results found.  Medications:   aspirin  EC  81 mg Oral Daily   atorvastatin   80 mg Oral Daily   Chlorhexidine  Gluconate Cloth  6 each Topical Q12H   clopidogrel   75 mg Oral Daily   darbepoetin (ARANESP ) injection - DIALYSIS  100 mcg Subcutaneous Q Thu-1800   Gerhardt's butt cream   Topical Daily   heparin   5,000 Units Subcutaneous Q8H   insulin  aspart  0-5 Units Subcutaneous QHS   insulin  aspart  0-9 Units Subcutaneous TID WC   insulin  aspart  7 Units Subcutaneous TID WC   insulin  glargine  25 Units Subcutaneous BID   lidocaine   2 patch Transdermal Q24H   magic mouthwash  5 mL Oral QID   midodrine   2.5 mg Oral TID WC   multivitamin  1 tablet Oral QHS   nutrition supplement (JUVEN)  1 packet Oral BID BM   pantoprazole   40 mg Oral Daily   senna-docusate  2 tablet Oral BID   sodium chloride  flush  10-40 mL Intracatheter Q12H   zolpidem   5 mg Oral QHS    Dialysis Orders: [facility] MWF 4h B350  133.5kg  TDC  Heparin  2000+ 1400u/hr Calcitriol 1.25 three  times per week Mircera 90 mcg q 2 wks (last 12/1)    CXR 12/11-> resolved vasc congestion, improved IS edema  Assessment/Plan: ESRD - usual HD MWF. CRRT used from 12/10 - 12/13 for BP stability in the face of stroke-like symptoms. Stable off of CRRT. Tolerated regular HD Monday, Wednesday, and Friday. Next HD tomorrow per the HD holiday schedule. Volume - vasc congestion gone w/ last CXR. Now on RA. Standing wt today was 134.7kg. UF goal 1.5- 2 L.  BP: sbp's mostly 110- 130 Anemia - Hgb 7- 9 recently. Getting ESA here w/ darbe 100 mcg weekly. No iron  with high ferritin.  Transfuse prn NSTEMI - 3V CAD by LHC -> s/p CABG 12/08 Stroke-like symptoms: was waxing/ waning per neuro. There was no acute CVA by imaging, but due to advanced carotid disease by CTA pt underwent L carotid artery stent per [facility] 12/10.  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

## 2024-01-20 NOTE — Progress Notes (Signed)
 "                                                        PROGRESS NOTE   Subjective/Complaints: BP improved today but was soft yesterday, since this has been limiting therapy and patient is symptomatic, have ordered abdominal binder and scheduled midodrine   ROS: +orthostasis, +dizziness   Objective:   No results found.  Recent Labs    01/19/24 1152  WBC 12.8*  HGB 8.0*  HCT 25.3*  PLT 450*   Recent Labs    01/19/24 1152  NA 131*  K 5.1  CL 93*  CO2 21*  GLUCOSE 158*  BUN 73*  CREATININE 6.41*  CALCIUM  8.9    Intake/Output Summary (Last 24 hours) at 01/20/2024 1049 Last data filed at 01/20/2024 0715 Gross per 24 hour  Intake 240 ml  Output 2000 ml  Net -1760 ml     Wound 01/18/24 1604 Pressure Injury Sacrum Unstageable - Full thickness tissue loss in which the base of the injury is covered by slough (yellow, tan, gray, green or brown) and/or eschar (tan, brown or black) in the wound bed. (Active)    Physical Exam: Vital Signs Blood pressure 122/81, pulse 65, temperature (!) 97.5 F (36.4 C), temperature source Oral, resp. rate 18, height 6' (1.829 m), weight 125.1 kg, SpO2 97%. Gen: no distress, normal appearing HEENT: oral mucosa pink and moist, NCAT Cardio: Reg rate Chest: normal effort, normal rate of breathing Abd: soft, non-distended Ext: no edema Psych: pleasant, normal affect Skin: right calf and midline chest incisions are c/d/i Neuro: Alert and oriented x3, decreased sensation in bilateral feet. 5/5 strength throughout- stable 12/20  Assessment/Plan: 1. Functional deficits which require 3+ hours per day of interdisciplinary therapy in a comprehensive inpatient rehab setting. Physiatrist is providing close team supervision and 24 hour management of active medical problems listed below. Physiatrist and rehab team continue to assess barriers to discharge/monitor patient progress toward functional and medical goals  Care Tool:  Bathing    Body  parts bathed by patient: Chest, Left arm, Right arm, Abdomen, Front perineal area, Right upper leg, Left upper leg, Face   Body parts bathed by helper: Right lower leg, Left lower leg, Buttocks     Bathing assist Assist Level: Moderate Assistance - Patient 50 - 74%     Upper Body Dressing/Undressing Upper body dressing   What is the patient wearing?: Pull over shirt    Upper body assist Assist Level: Supervision/Verbal cueing    Lower Body Dressing/Undressing Lower body dressing      What is the patient wearing?: Underwear/pull up, Pants     Lower body assist Assist for lower body dressing: Minimal Assistance - Patient > 75%     Toileting Toileting    Toileting assist Assist for toileting: Moderate Assistance - Patient 50 - 74%     Transfers Chair/bed transfer  Transfers assist     Chair/bed transfer assist level: Minimal Assistance - Patient > 75%     Locomotion Ambulation   Ambulation assist      Assist level: Minimal Assistance - Patient > 75% Assistive device: No Device Max distance: 44   Walk 10 feet activity   Assist     Assist level: Minimal Assistance - Patient > 75% Assistive device: No Device  Walk 50 feet activity   Assist Walk 50 feet with 2 turns activity did not occur: Safety/medical concerns (fatigue, SOB, dizziness, weakness/deconditioning)         Walk 150 feet activity   Assist Walk 150 feet activity did not occur: Safety/medical concerns (fatigue, SOB, dizziness, weakness/deconditioning)         Walk 10 feet on uneven surface  activity   Assist Walk 10 feet on uneven surfaces activity did not occur: Safety/medical concerns (fatigue, SOB, dizziness, weakness/deconditioning)         Wheelchair     Assist Is the patient using a wheelchair?: Yes Type of Wheelchair: Manual    Wheelchair assist level: Dependent - Patient 0% Max wheelchair distance: 1103ft    Wheelchair 50 feet with 2 turns  activity    Assist        Assist Level: Dependent - Patient 0%   Wheelchair 150 feet activity     Assist      Assist Level: Dependent - Patient 0%   Blood pressure 122/81, pulse 65, temperature (!) 97.5 F (36.4 C), temperature source Oral, resp. rate 18, height 6' (1.829 m), weight 125.1 kg, SpO2 97%.  Medical Problem List and Plan: 1. 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  2. Pain: d/c oxycodone , continue tramadol  for moderate pain.              --Continue lidocaine  patch to chest wall for local measures  3. Chronic insomnia: continue ambien   4. Multiple incisions: paint with betadine bid.              --Gerhardt's and foam dressing to partial thickness sacral wound. Juven added to promote wound healing.   5. Significant Orthostatic hypotension Impacting therapy: TEDs/Binder as continues to have BP drop but asymptomatic per OT notes             --Sitting 113/33-->stand 130/42--> 98/33 after sitting 30 mins.              --also has Hx of vertigo. -increase midodrine  to 10mg  TID orn for HD -schedule midodrine  2.5mg  TID  6. 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  7.  T2DM: Hgb A1c- 9.9. Monitor BS ac/hs and use SSI for elevated BS. Add CM restrictions to diet. Used Lantus  50 units PTA             --On Lantus  25 units BID   -Increase aspart to 7U with meals  -provided list of foods for diabetes  8. ESRD: Off CRRT and back on MWF HD schedule. Continue fluid restriction --schedule therapy at the end of the day to help with tolerance of therapy.   9.  Acute on chronic anemia: Hgb reviewed and was 9 on 12/9.  On Epo  10. Leucocytosis: Has had leucocytosis which continues to fluctuate             --12.2 at admission -->19.0-->12.2-->15.4 01/17/24 on last check             --  monitor for fevers and other signs of infection.   11. Constipation: Change dulcolax to senna S 2 bid. LBM 12/18  12.  Low protein stores: Alb-2.52. Juven added.   13. Odynophagia: Will add MMW  14. Class 2 Obesity: BMI- 37.4 on 12/20 Educate on diet and exercise to promote overall health and mobility.   15. Atelectasis: continue flutter valve.     LOS: 2 days A FACE TO FACE EVALUATION WAS PERFORMED  Timothy Holloway Timothy Holloway 01/20/2024, 10:49 AM     "

## 2024-01-21 LAB — GLUCOSE, CAPILLARY
Glucose-Capillary: 52 mg/dL — ABNORMAL LOW (ref 70–99)
Glucose-Capillary: 69 mg/dL — ABNORMAL LOW (ref 70–99)
Glucose-Capillary: 78 mg/dL (ref 70–99)
Glucose-Capillary: 154 mg/dL — ABNORMAL HIGH (ref 70–99)
Glucose-Capillary: 88 mg/dL (ref 70–99)
Glucose-Capillary: 72 mg/dL (ref 70–99)
Glucose-Capillary: 151 mg/dL — ABNORMAL HIGH (ref 70–99)

## 2024-01-21 LAB — CBC WITH DIFFERENTIAL/PLATELET
Abs Immature Granulocytes: 0.19 K/uL — ABNORMAL HIGH (ref 0.00–0.07)
Basophils Absolute: 0 K/uL (ref 0.0–0.1)
Basophils Relative: 0 %
Eosinophils Absolute: 0.8 K/uL — ABNORMAL HIGH (ref 0.0–0.5)
Eosinophils Relative: 6 %
HCT: 21.9 % — ABNORMAL LOW (ref 39.0–52.0)
Hemoglobin: 7.2 g/dL — ABNORMAL LOW (ref 13.0–17.0)
Immature Granulocytes: 1 %
Lymphocytes Relative: 8 %
Lymphs Abs: 1.1 K/uL (ref 0.7–4.0)
MCH: 31 pg (ref 26.0–34.0)
MCHC: 32.9 g/dL (ref 30.0–36.0)
MCV: 94.4 fL (ref 80.0–100.0)
Monocytes Absolute: 1.1 K/uL — ABNORMAL HIGH (ref 0.1–1.0)
Monocytes Relative: 8 %
Neutro Abs: 10 K/uL — ABNORMAL HIGH (ref 1.7–7.7)
Neutrophils Relative %: 77 %
Platelets: 422 K/uL — ABNORMAL HIGH (ref 150–400)
RBC: 2.32 MIL/uL — ABNORMAL LOW (ref 4.22–5.81)
RDW: 16 % — ABNORMAL HIGH (ref 11.5–15.5)
WBC: 13.2 K/uL — ABNORMAL HIGH (ref 4.0–10.5)
nRBC: 0 % (ref 0.0–0.2)

## 2024-01-21 LAB — RENAL FUNCTION PANEL
Albumin: 3.1 g/dL — ABNORMAL LOW (ref 3.5–5.0)
Anion gap: 15 (ref 5–15)
BUN: 94 mg/dL — ABNORMAL HIGH (ref 8–23)
CO2: 22 mmol/L (ref 22–32)
Calcium: 8.5 mg/dL — ABNORMAL LOW (ref 8.9–10.3)
Chloride: 92 mmol/L — ABNORMAL LOW (ref 98–111)
Creatinine, Ser: 7.3 mg/dL — ABNORMAL HIGH (ref 0.61–1.24)
GFR, Estimated: 7 mL/min — ABNORMAL LOW
Glucose, Bld: 52 mg/dL — ABNORMAL LOW (ref 70–99)
Phosphorus: 6.4 mg/dL — ABNORMAL HIGH (ref 2.5–4.6)
Potassium: 4.9 mmol/L (ref 3.5–5.1)
Sodium: 128 mmol/L — ABNORMAL LOW (ref 135–145)

## 2024-01-21 MED ORDER — MIDODRINE HCL 5 MG PO TABS
2.5000 mg | ORAL_TABLET | ORAL | Status: AC
  Administered 2024-01-21: 2.5 mg via ORAL

## 2024-01-21 MED ORDER — COLLAGENASE 250 UNIT/GM EX OINT
TOPICAL_OINTMENT | Freq: Every day | CUTANEOUS | Status: DC
  Filled 2024-01-21: qty 30

## 2024-01-21 MED ORDER — DARBEPOETIN ALFA 150 MCG/0.3ML IJ SOSY
150.0000 ug | PREFILLED_SYRINGE | INTRAMUSCULAR | Status: DC
  Administered 2024-01-25 – 2024-02-01 (×2): 150 ug via SUBCUTANEOUS
  Filled 2024-01-21: qty 0.3

## 2024-01-21 MED ORDER — MEDIHONEY WOUND/BURN DRESSING EX PSTE
1.0000 | PASTE | Freq: Every day | CUTANEOUS | Status: DC
  Filled 2024-01-21: qty 44

## 2024-01-21 MED ORDER — INSULIN ASPART 100 UNIT/ML IJ SOLN: 2.0000 [IU] | Freq: Three times a day (TID) | INTRAMUSCULAR | Status: DC

## 2024-01-21 MED ORDER — MIDODRINE HCL 5 MG PO TABS
ORAL_TABLET | ORAL | Status: AC
  Filled 2024-01-21: qty 1

## 2024-01-21 MED ORDER — HEPARIN SODIUM (PORCINE) 1000 UNIT/ML IJ SOLN
INTRAMUSCULAR | Status: AC
  Filled 2024-01-21: qty 4

## 2024-01-21 MED ORDER — MIDODRINE HCL 5 MG PO TABS
5.0000 mg | ORAL_TABLET | Freq: Three times a day (TID) | ORAL | Status: DC
  Administered 2024-01-21 – 2024-01-22 (×2): 5 mg via ORAL
  Filled 2024-01-21 (×2): qty 1

## 2024-01-21 MED ORDER — HEPARIN SODIUM (PORCINE) 1000 UNIT/ML IJ SOLN
3200.0000 [IU] | Freq: Once | INTRAMUSCULAR | Status: AC
  Administered 2024-01-21: 3200 [IU]

## 2024-01-21 MED ORDER — INSULIN ASPART 100 UNIT/ML IJ SOLN
6.0000 [IU] | Freq: Three times a day (TID) | INTRAMUSCULAR | Status: DC
  Administered 2024-01-22 – 2024-01-26 (×11): 6 [IU] via SUBCUTANEOUS
  Filled 2024-01-21 (×13): qty 6

## 2024-01-21 MED ORDER — FLUDROCORTISONE ACETATE 0.1 MG PO TABS
0.1000 mg | ORAL_TABLET | Freq: Every day | ORAL | Status: DC
  Filled 2024-01-21: qty 1

## 2024-01-21 NOTE — Progress Notes (Signed)
 Received patient in bed to unit.  Alert and oriented.  Informed consent signed and in chart.   TX duration:3.5 hours  Patient tolerated well.  Transported back to the room  Alert, without acute distress.  Hand-off given to patient's nurse.   Access used: R Chest HD cath Access issues: none  Total UF removed: 1.5L   01/21/24 1744  Vitals  Temp 97.8 F (36.6 C)  Temp Source Oral  BP (!) 119/43  Pulse Rate 60  ECG Heart Rate 62  Resp 18  Weight 131.9 kg  Type of Weight Post-Dialysis  Oxygen Therapy  SpO2 96 %  O2 Device Room Air  During Treatment Monitoring  Duration of HD Treatment -hour(s) 3.5 hour(s)  HD Safety Checks Performed Yes  Intra-Hemodialysis Comments Tx completed  Post Treatment  Dialyzer Clearance Clear  Liters Processed 63  Fluid Removed (mL) 1500 mL  Tolerated HD Treatment Yes  Hemodialysis Catheter Right Internal jugular Double lumen Permanent (Tunneled)  Placement Date/Time: 10/23/23 0931   Placed prior to admission: No  Serial / Lot #: [id number]  Expiration Date: 06/30/28  Time Out: Correct patient;Correct site;Correct procedure  Maximum sterile barrier precautions: Hand hygiene;Large sterile sheet;C...  Site Condition No complications  Blue Lumen Status Flushed;Antimicrobial dead end cap;Heparin  locked  Red Lumen Status Flushed;Antimicrobial dead end cap;Heparin  locked  Purple Lumen Status N/A  Catheter fill solution Heparin  1000 units/ml  Catheter fill volume (Arterial) 1.6 cc  Catheter fill volume (Venous) 1.6  Dressing Type Transparent  Dressing Status Clean, Dry, Intact  Drainage Description None  Dressing Change Due 01/22/24  Post treatment catheter status Capped and Clamped     Camellia Brasil LPN [facility]

## 2024-01-21 NOTE — IPOC Note (Signed)
 Overall Plan of Care [facility]) Patient Details Name: Timothy Holloway MRN: [id number] DOB: Jan 27, 1956  Admitting Diagnosis: Embolic stroke [facility])  Hospital Problems: Principal Problem:   Embolic stroke [facility])     Functional Problem List: Nursing Safety, Bowel, Endurance, Pain  PT Balance, Edema, Endurance, Motor, Nutrition, Pain, Skin Integrity  OT Balance, Edema, Endurance, Motor, Pain, Skin Integrity  SLP    TR         Basic ADLs: OT Bathing, Dressing, Toileting     Advanced  ADLs: OT None     Transfers: PT Bed Mobility, Bed to Chair, Car, Occupational Psychologist, Research Scientist (life Sciences): PT Ambulation, Psychologist, [employer], Stairs     Additional Impairments: OT None  SLP        TR      Anticipated Outcomes Item Anticipated Outcome  Self Feeding Independent  Swallowing      Basic self-care  Mod I  Toileting  Mod I   Bathroom Transfers Mod I  Bowel/Bladder  manage bowel with mod I assist  Transfers  Mod I with LRAD  Locomotion  Mod I with LRAD  Communication     Cognition     Pain  PAin < 4 with prns  Safety/Judgment  manage safety w cues   Therapy Plan: PT Intensity: Minimum of 1-2 x/day ,45 to 90 minutes PT Frequency: 5 out of 7 days PT Duration Estimated Length of Stay: 12-14 days OT Intensity: Minimum of 1-2 x/day, 45 to 90 minutes OT Frequency: 5 out of 7 days OT Duration/Estimated Length of Stay: 10-12 days     Team Interventions: Nursing Interventions Patient/Family Education, Medication Management, Bowel Management, Skin Care/Wound Management, Disease Management/Prevention, Pain Management, Discharge Planning  PT interventions Ambulation/gait training, Discharge planning, Functional mobility training, Psychosocial support, Therapeutic Activities, Balance/vestibular training, Disease management/prevention, Neuromuscular re-education, Skin care/wound management, Therapeutic Exercise, Wheelchair propulsion/positioning, DME/adaptive  equipment instruction, Pain management, UE/LE Strength taining/ROM, Community reintegration, Equities Trader education, Museum/gallery curator, UE/LE Coordination activities  OT Interventions Warden/ranger, Discharge planning, Pain management, Self Care/advanced ADL retraining, Therapeutic Activities, UE/LE Coordination activities, Disease mangement/prevention, Functional mobility training, Patient/family education, Skin care/wound managment, Therapeutic Exercise, DME/adaptive equipment instruction  SLP Interventions    TR Interventions    SW/CM Interventions Discharge Planning, Psychosocial Support, Patient/Family Education, Disease Management/Prevention   Barriers to Discharge MD  Medical stability  Nursing Decreased caregiver support, Home environment access/layout 1 level 1 ste no rail w spouse  PT Home environment access/layout, Wound Care, Weight, Hemodialysis, Other (comments) sternal precautions, HD, global weakness/deconditioning, poor activity tolerance, 1+1 STE  OT Inaccessible home environment, Home environment access/layout, Wound Care, Hemodialysis    SLP      SW       Team Discharge Planning: Destination: PT-Home ,OT- Home , SLP-  Projected Follow-up: PT-Outpatient PT, OT-  Outpatient OT, SLP-  Projected Equipment Needs: PT-To be determined, OT- To be determined, SLP-  Equipment Details: PT-has quad cane, OT-  Patient/family involved in discharge planning: PT- Patient,  OT-Patient, SLP-   MD ELOS: 14-17 days S Medical Rehab Prognosis:  Excellent Assessment: The patient has been admitted for CIR therapies with the diagnosis of multiple scattered embolic strokes. The team will be addressing functional mobility, strength, stamina, balance, safety, adaptive techniques and equipment, self-care, bowel and bladder mgt, patient and caregiver education. Goals have been set at supervision, Anticipated discharge destination is home.        See Team Conference Notes for  weekly updates to the plan  of care

## 2024-01-21 NOTE — Significant Event (Signed)
 Hypoglycemic Event  CBG: 52  Treatment: 4 oz juice/soda  Symptoms: None  Follow-up CBG: Time:0651 CBG Result: 69  Possible Reasons for Event: Other: Dose of lantus  25 units given as ordered and within parameters with night time meds.  Comments/MD notified:  PT BG at 0632 = 52. Pt provided with breakfast tray and set up to eat.  0651 - Pt has finished half of his breakfast - BG checked = 69.  Pt continuing to finish breakfast and drink grape juice provided.  Will recheck sugar again once pt has completed breakfast.  Timothy Holloway B Merryl Buckels

## 2024-01-21 NOTE — Progress Notes (Addendum)
 "                                                        PROGRESS NOTE   Subjective/Complaints: Hypoglycemic, d/ced ISS and decreased aspart, CBG increased to 154, will increase aspart back to 6U Has no other complaints this morning  ROS: +orthostasis, +dizziness- improved   Objective:   No results found.  Recent Labs    01/19/24 1152 01/21/24 0640  WBC 12.8* 13.2*  HGB 8.0* 7.2*  HCT 25.3* 21.9*  PLT 450* 422*   Recent Labs    01/19/24 1152 01/21/24 0640  NA 131* 128*  K 5.1 4.9  CL 93* 92*  CO2 21* 22  GLUCOSE 158* 52*  BUN 73* 94*  CREATININE 6.41* 7.30*  CALCIUM  8.9 8.5*    Intake/Output Summary (Last 24 hours) at 01/21/2024 1220 Last data filed at 01/21/2024 9277 Gross per 24 hour  Intake 476 ml  Output --  Net 476 ml     Wound 01/18/24 1604 Pressure Injury Sacrum Unstageable - Full thickness tissue loss in which the base of the injury is covered by slough (yellow, tan, gray, green or brown) and/or eschar (tan, brown or black) in the wound bed. (Active)    Physical Exam: Vital Signs Blood pressure (!) 111/42, pulse (!) 59, temperature 97.8 F (36.6 C), temperature source Oral, resp. rate 17, height 6' (1.829 m), weight 132.5 kg, SpO2 97%. Gen: no distress, normal appearing HEENT: oral mucosa pink and moist, NCAT Cardio: Reg rate Chest: normal effort, normal rate of breathing Abd: soft, non-distended Ext: no edema Psych: pleasant, normal affect Skin: right calf and midline chest incisions are c/d/i Neuro: Alert and oriented x3, decreased sensation in bilateral feet. 5/5 strength throughout- stable 12/21  Assessment/Plan: 1. Functional deficits which require 3+ hours per day of interdisciplinary therapy in a comprehensive inpatient rehab setting. Physiatrist is providing close team supervision and 24 hour management of active medical problems listed below. Physiatrist and rehab team continue to assess barriers to discharge/monitor patient progress  toward functional and medical goals  Care Tool:  Bathing    Body parts bathed by patient: Chest, Left arm, Right arm, Abdomen, Front perineal area, Right upper leg, Left upper leg, Face   Body parts bathed by helper: Right lower leg, Left lower leg, Buttocks     Bathing assist Assist Level: Moderate Assistance - Patient 50 - 74%     Upper Body Dressing/Undressing Upper body dressing   What is the patient wearing?: Pull over shirt    Upper body assist Assist Level: Supervision/Verbal cueing    Lower Body Dressing/Undressing Lower body dressing      What is the patient wearing?: Pants     Lower body assist Assist for lower body dressing: Contact Guard/Touching assist     Toileting Toileting    Toileting assist Assist for toileting: Moderate Assistance - Patient 50 - 74%     Transfers Chair/bed transfer  Transfers assist     Chair/bed transfer assist level: Minimal Assistance - Patient > 75%     Locomotion Ambulation   Ambulation assist      Assist level: Minimal Assistance - Patient > 75% Assistive device: No Device Max distance: 20   Walk 10 feet activity   Assist     Assist level: Minimal Assistance -  Patient > 75% Assistive device: No Device   Walk 50 feet activity   Assist Walk 50 feet with 2 turns activity did not occur: Safety/medical concerns (fatigue, SOB, dizziness, weakness/deconditioning)         Walk 150 feet activity   Assist Walk 150 feet activity did not occur: Safety/medical concerns (fatigue, SOB, dizziness, weakness/deconditioning)         Walk 10 feet on uneven surface  activity   Assist Walk 10 feet on uneven surfaces activity did not occur: Safety/medical concerns (fatigue, SOB, dizziness, weakness/deconditioning)         Wheelchair     Assist Is the patient using a wheelchair?: Yes Type of Wheelchair: Manual    Wheelchair assist level: Dependent - Patient 0% Max wheelchair distance: 126ft     Wheelchair 50 feet with 2 turns activity    Assist        Assist Level: Dependent - Patient 0%   Wheelchair 150 feet activity     Assist      Assist Level: Dependent - Patient 0%   Blood pressure (!) 111/42, pulse (!) 59, temperature 97.8 F (36.6 C), temperature source Oral, resp. rate 17, height 6' (1.829 m), weight 132.5 kg, SpO2 97%.  Medical Problem List and Plan: 1. 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  2. Pain: d/c oxycodone , continue tramadol  for moderate pain.              --Continue lidocaine  patch to chest wall for local measures  3. Chronic insomnia: continue ambien   4. Multiple incisions: paint with betadine bid.              --Gerhardt's and foam dressing to partial thickness sacral wound. Juven added to promote wound healing.   5. Significant Orthostatic hypotension Impacting therapy: TEDs/Binder as continues to have BP drop but asymptomatic per OT notes             --Sitting 113/33-->stand 130/42--> 98/33 after sitting 30 mins.              --also has Hx of vertigo. -increase midodrine  to 10mg  TID orn for HD -increase midodrine  to 5mg  TID  6. 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  7.  T2DM: Hgb A1c- 9.9. Monitor BS ac/hs, Add CM restrictions to diet. Used Lantus  50 units PTA             --On Lantus  25 units BID   -decrease aspart to 6U with meals due to hypoglycemia. D/c ISS  -provided list of foods for diabetes  8. ESRD: Off CRRT and back on MWF HD schedule. Continue fluid restriction --schedule therapy at the end of the day to help with tolerance of therapy.   9.  Acute on  chronic anemia: Hgb reviewed and was 9 on 12/9. On Epo  10. Leucocytosis: Has had leucocytosis which continues to fluctuate             --12.2 at admission -->  19.0-->12.2-->15.4 01/17/24 on last check             --monitor for fevers and other signs of infection.   11. Constipation: Change dulcolax to senna S 2 bid. LBM 12/20  12.  Low protein stores: Alb-2.52. continue Juven  13. Odynophagia: continue MMW  14. Class 2 Obesity: BMI- 37.4 on 12/20 Educate on diet and exercise to promote overall health and mobility.   15. Atelectasis: continue flutter valve.     LOS: 3 days A FACE TO FACE EVALUATION WAS PERFORMED  Sven SQUIBB Lelan Cush 01/21/2024, 12:20 PM     "

## 2024-01-21 NOTE — Progress Notes (Signed)
 Occupational Therapy Session Note  Patient Details  Name: Timothy Holloway MRN: [id number] Date of Birth: 1955/05/19  Today's Date: 01/21/2024 OT Individual Time: 0700-0810 OT Individual Time Calculation (min): 70 min    Short Term Goals: Week 1:  OT Short Term Goal 1 (Week 1): Pt will complete LB with AE PRN with CGA OT Short Term Goal 2 (Week 1): Pt will complete toilet transfer with CGA using LRAD OT Short Term Goal 3 (Week 1): Pt will adhere to sternal precautions 90% of the time during functional ADL task with min cues  Skilled Therapeutic Interventions/Progress Updates:    Pt seated EOB and agreeable to therapy. BP seated EOB 118/42. OTA donned Ted hose and applied abd binder for pt. Pt able to thread pants seated EOB. Sit<>stand from elevated bed height with CGA. Pt able to pull pants over hips CGA for standing balance. Pt asymptomatice for OH BP. BP standing 95/50. Pt completed grooming tasks seated in w/c at sink. Pt demonstrated use of TTB but did not practice during this session. NuStep (BLE only) 8 mins level 3. Pt returned to room and transferred to EOB. Pt reamined EOB with bed alarm activated. All needs within reach.   Therapy Documentation Precautions:  Precautions Precautions: Fall, Sternal Recall of Precautions/Restrictions: Impaired Precaution/Restrictions Comments: watch BP Restrictions Weight Bearing Restrictions Per Provider Order: No Other Position/Activity Restrictions: Sternal precautions Pain:  Pt reports 4/10 sternum discomfort; meds admin during sessin   Therapy/Group: Individual Therapy  Timothy Holloway 01/21/2024, 8:20 AM

## 2024-01-21 NOTE — Progress Notes (Signed)
 Physical Therapy Session Note  Patient Details  Name: Timothy Holloway MRN: [id number] Date of Birth: 09/13/1955  Today's Date: 01/21/2024 PT Individual Time: 1105-1201 PT Individual Time Calculation (min): 56 min   Short Term Goals: Week 1:  PT Short Term Goal 1 (Week 1): pt will perform bed mobility with supervision using bed features PT Short Term Goal 2 (Week 1): pt will perform all transfers with LRAD and CGA PT Short Term Goal 3 (Week 1): pt will ambulate 43ft with LRAD and CGA  Skilled Therapeutic Interventions/Progress Updates:    Pt presents in room in bed, agreeable to PT but reporting feeling bad this morning and fatigued from previous therapy sessions. Session focused on therapeutic activities to facilitate improved mechanics and decreased pain with bed mobility and transfers, provide improved WC seating for hemodynamic support as well as provide pressure relief, and tolerance to activity via interval training. Therapist takes vitals twice on L arm, noted below with pt in supine, pt denies dizziness. RN notified and states to monitor symptoms of dizziness with mobility otherwise clears pt for PT. Therapist provides pt with 22 TIS WC and ROHO cushion to promote hemodynamic support in case of onset dizziness as well as provide higher surface to transfer from secondary to sternal precautions. Pt completes supine to sit with elevated HOB with mod assist for trunk to upright. Pt completes sit to stand with min assist from elevated EOB, completes stand pivot with CGA. Pt transported to day room via WC dependently for time management and energy conservation. Pt completes interval training on kinetron at resistance 10 cm/sec, 30 sec work/30 sec rest for 10 minutes total. Pt returns to room and remains seated in [facility] with all needs within reach, cal light in place and chair alarm donned and activated at end of session. Pt requests to remain seated upright for lunch, denies symptoms. Pt RN notified  of pt position.   Vitals: Supine prior to activity with TED hose/ace wraps donned: BP 104/47 (52), HR 51 (taken on L arm) RN notified Seated following interval training: BP 115/34 (55), HR 57  Therapy Documentation Precautions:  Precautions Precautions: Fall, Sternal Recall of Precautions/Restrictions: Impaired Precaution/Restrictions Comments: watch BP Restrictions Weight Bearing Restrictions Per Provider Order: No Other Position/Activity Restrictions: Sternal precautions    Therapy/Group: Individual Therapy  Reche Ohara PT, DPT 01/21/2024, 12:06 PM

## 2024-01-21 NOTE — Progress Notes (Signed)
 Occupational Therapy Session Note  Patient Details  Name: Timothy Holloway MRN: [id number] Date of Birth: [birth year]  Today's Date: 01/21/2024 OT Individual Time: 9084-8988 OT Individual Time Calculation (min): 56 min    Short Term Goals: Week 1:  OT Short Term Goal 1 (Week 1): Pt will complete LB with AE PRN with CGA OT Short Term Goal 2 (Week 1): Pt will complete toilet transfer with CGA using LRAD OT Short Term Goal 3 (Week 1): Pt will adhere to sternal precautions 90% of the time during functional ADL task with min cues  Skilled Therapeutic Interventions/Progress Updates:    Pt seated EOB upon arrival.   BP during session: Seated 99/40 LUE and 124/59 RUE  Pt reports lightheadedness when standing;  Ace wraps donned on BLE  Sit<>stand from EOB with CGA   BP (LUE) after Ace wraps: Seated 115/49 Standing 100/53 and 115/34  Pt symptomatic  Pt returned to supine with HOB elevated-min A for BLE mgmt.  Pt remained in bed with all needs within reach.   Therapy Documentation Precautions:  Precautions Precautions: Fall, Sternal Recall of Precautions/Restrictions: Impaired Precaution/Restrictions Comments: watch BP Restrictions Weight Bearing Restrictions Per Provider Order: No Other Position/Activity Restrictions: Sternal precautions   Pain:  Pt reports increased sternum pain at end of session; returned to bed and meds requested   Therapy/Group: Individual Therapy  Maritza Debby Mare 01/21/2024, 10:14 AM

## 2024-01-21 NOTE — Progress Notes (Signed)
 " [facility] Progress Note   Subjective:    Seen and examined patient. He just finished working with PT. He reports occasional blood pressure drops with dizziness. He's on low-dose Midodrine  2.5mg  TID and PRN. Plan for HD this afternoon.  Objective Vitals:   01/21/24 0500 01/21/24 0533 01/21/24 1313 01/21/24 1315  BP: (!) 111/42  (!) 112/26 (!) (P) 119/45  Pulse:   (!) 57 (!) (P) 56  Resp: 17  18   Temp: 97.8 F (36.6 C)  98 F (36.7 C)   TempSrc: Oral     SpO2:   100%   Weight:  132.5 kg    Height:       Physical Exam General: Awake, alert, NAD Heart: S1 and S2; No murmurs, gallops, or rubs Lungs: Clear throughout Abdomen: Soft and non-tender Extremities: No LE edema Dialysis Access: [facility]   Filed Weights   01/19/24 1850 01/20/24 0607 01/21/24 0533  Weight: 132.1 kg 125.1 kg 132.5 kg    Intake/Output Summary (Last 24 hours) at 01/21/2024 1325 Last data filed at 01/21/2024 1220 Gross per 24 hour  Intake 712 ml  Output --  Net 712 ml    Additional Objective Labs: Basic Metabolic Panel: Recent Labs  Lab 01/16/24 0152 01/17/24 0351 01/19/24 1152 01/21/24 0640  NA 133* 133* 131* 128*  K 4.0 4.9 5.1 4.9  CL 97* 95* 93* 92*  CO2 26 25 21* 22  GLUCOSE 151* 125* 158* 52*  BUN 50* 74* 73* 94*  CREATININE 4.20* 5.89* 6.41* 7.30*  CALCIUM  7.9* 8.9 8.9 8.5*  PHOS 3.3  --  5.6* 6.4*   Liver Function Tests: Recent Labs  Lab 01/16/24 0152 01/19/24 1152 01/21/24 0640  ALBUMIN  2.5* 3.1* 3.1*   No results for input(s): LIPASE, AMYLASE in the last 168 hours. CBC: Recent Labs  Lab 01/15/24 0457 01/17/24 0351 01/19/24 1152 01/21/24 0640  WBC 15.9* 15.4* 12.8* 13.2*  NEUTROABS  --   --  9.7* 10.0*  HGB 7.5* 7.6* 8.0* 7.2*  HCT 24.2* 23.7* 25.3* 21.9*  MCV 98.0 96.3 96.9 94.4  PLT 294 323 450* 422*   Blood Culture    Component Value Date/Time   SDES BLOOD BLOOD RIGHT HAND 11/11/2022 1145   SDES BLOOD BLOOD RIGHT ARM 11/11/2022 1145    SPECREQUEST NONE 11/11/2022 1145   SPECREQUEST NONE 11/11/2022 1145   CULT  11/11/2022 1145    NO GROWTH 5 DAYS Performed at [facility], [address], [place], [place] [address]    CULT  11/11/2022 1145    NO GROWTH 5 DAYS Performed at [facility], [address], [place], [place] [address]    REPTSTATUS 11/16/2022 FINAL 11/11/2022 1145   REPTSTATUS 11/16/2022 FINAL 11/11/2022 1145    Cardiac Enzymes: No results for input(s): CKTOTAL, CKMB, CKMBINDEX, TROPONINI in the last 168 hours. CBG: Recent Labs  Lab 01/20/24 2151 01/21/24 0632 01/21/24 0651 01/21/24 0700 01/21/24 1124  GLUCAP 155* 52* 69* 78 154*   Iron  Studies: No results for input(s): IRON , TIBC, TRANSFERRIN, FERRITIN in the last 72 hours. Lab Results  Component Value Date   INR 1.3 (H) 01/08/2024   INR 1.1 01/07/2024   INR 1.1 11/11/2022   Studies/Results: No results found.  Medications:   aspirin  EC  81 mg Oral Daily   atorvastatin   80 mg Oral Daily   Chlorhexidine  Gluconate Cloth  6 each Topical Q0600   clopidogrel   75 mg Oral Daily   darbepoetin (ARANESP ) injection - DIALYSIS  100  mcg Subcutaneous Q Thu-1800   fludrocortisone   0.1 mg Oral Daily   Gerhardt's butt cream   Topical Daily   heparin   5,000 Units Subcutaneous Q8H   insulin  aspart  6 Units Subcutaneous TID WC   insulin  glargine  25 Units Subcutaneous BID   leptospermum manuka honey  1 Application Topical Daily   lidocaine   2 patch Transdermal Q24H   magic mouthwash  5 mL Oral QID   midodrine   2.5 mg Oral TID WC   multivitamin  1 tablet Oral QHS   nutrition supplement (JUVEN)  1 packet Oral BID BM   pantoprazole   40 mg Oral Daily   senna-docusate  2 tablet Oral BID   sodium chloride  flush  10-40 mL Intracatheter Q12H   zolpidem   5 mg Oral QHS    Dialysis Orders: [facility] MWF 4h B350  133.5kg  TDC  Heparin  2000+ 1400u/hr Calcitriol 1.25 three times per week Mircera 90 mcg q 2 wks (last 12/1)    CXR 12/11->  resolved vasc congestion, improved IS edema  Assessment/Plan: ESRD - usual HD MWF. CRRT used from 12/10 - 12/13 for BP stability in the face of stroke-like symptoms. Stable off of CRRT. Tolerated regular HD Monday, Wednesday, and Friday. Next HD this afternoon per the HD holiday schedule. Volume - vasc congestion gone w/ last CXR. Now on RA. Standing wt today was 134.7kg. UF goal 1.5- 2 L.  BP: Noted DBP low and he reports occasional dizziness. Raised his Midodrine  to 5mg  TID and can titrate up further if needed. Continue Midodrine  PRN regimen. Anemia - Hgb 7- 9 recently. Raising his ESA today. No iron  with high ferritin.  Transfuse prn NSTEMI - 3V CAD by LHC -> s/p CABG 12/08 Stroke-like symptoms: was waxing/ waning per neuro. There was no acute CVA by imaging, but due to advanced carotid disease by CTA pt underwent L carotid artery stent per [facility] 12/10.  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

## 2024-01-22 DIAGNOSIS — N186 End stage renal disease: Secondary | ICD-10-CM

## 2024-01-22 DIAGNOSIS — D631 Anemia in chronic kidney disease: Secondary | ICD-10-CM

## 2024-01-22 DIAGNOSIS — I953 Hypotension of hemodialysis: Secondary | ICD-10-CM

## 2024-01-22 DIAGNOSIS — Z992 Dependence on renal dialysis: Secondary | ICD-10-CM

## 2024-01-22 LAB — GLUCOSE, CAPILLARY
Glucose-Capillary: 58 mg/dL — ABNORMAL LOW (ref 70–99)
Glucose-Capillary: 101 mg/dL — ABNORMAL HIGH (ref 70–99)
Glucose-Capillary: 59 mg/dL — ABNORMAL LOW (ref 70–99)
Glucose-Capillary: 130 mg/dL — ABNORMAL HIGH (ref 70–99)
Glucose-Capillary: 132 mg/dL — ABNORMAL HIGH (ref 70–99)
Glucose-Capillary: 163 mg/dL — ABNORMAL HIGH (ref 70–99)

## 2024-01-22 MED ORDER — INSULIN GLARGINE 100 UNIT/ML ~~LOC~~ SOLN
22.0000 [IU] | Freq: Two times a day (BID) | SUBCUTANEOUS | Status: DC
  Administered 2024-01-22 – 2024-01-27 (×10): 22 [IU] via SUBCUTANEOUS
  Filled 2024-01-22 (×12): qty 0.22

## 2024-01-22 MED ORDER — CHLORHEXIDINE GLUCONATE CLOTH 2 % EX PADS
6.0000 | MEDICATED_PAD | Freq: Two times a day (BID) | CUTANEOUS | Status: DC
  Administered 2024-01-22 – 2024-02-03 (×23): 6 via TOPICAL

## 2024-01-22 MED ORDER — MIDODRINE HCL 5 MG PO TABS
10.0000 mg | ORAL_TABLET | Freq: Three times a day (TID) | ORAL | Status: DC
  Administered 2024-01-22 – 2024-01-23 (×3): 10 mg via ORAL
  Filled 2024-01-22 (×3): qty 2

## 2024-01-22 MED ORDER — SUCROFERRIC OXYHYDROXIDE 500 MG PO CHEW
500.0000 mg | CHEWABLE_TABLET | Freq: Three times a day (TID) | ORAL | Status: DC
  Administered 2024-01-22 – 2024-02-03 (×34): 500 mg via ORAL
  Filled 2024-01-22 (×24): qty 1

## 2024-01-22 MED ORDER — SODIUM CHLORIDE 0.9 % IV SOLN
100.0000 mg | Freq: Once | INTRAVENOUS | Status: AC
  Administered 2024-01-24: 100 mg via INTRAVENOUS
  Filled 2024-01-22: qty 5

## 2024-01-22 NOTE — Progress Notes (Signed)
 Hypoglycemic Event  CBG: 58  Treatment: 8 oz juice/soda  Symptoms: None  Follow-up CBG: Upfz:[id number] CBG Result:101  Possible Reasons for Event: Unknown  Comments/MD notified:yes    Jerita Wimbush E Samba Cumba

## 2024-01-22 NOTE — Progress Notes (Addendum)
 Occupational Therapy Session Note  Patient Details  Name: Timothy Holloway MRN: [id number] Date of Birth: 1955/08/28  Today's Date: 01/22/2024 OT Individual Time: 8896-8851 OT Individual Time Calculation (min): 45 min    Short Term Goals: Week 1:  OT Short Term Goal 1 (Week 1): Pt will complete LB with AE PRN with CGA OT Short Term Goal 2 (Week 1): Pt will complete toilet transfer with CGA using LRAD OT Short Term Goal 3 (Week 1): Pt will adhere to sternal precautions 90% of the time during functional ADL task with min cues  Skilled Therapeutic Interventions/Progress Updates:   Patient received seated at edge of bed.  I felt like I had to pee but it was a false alarm.  Patient declined transfer to drop arm commode.  Patient now with orders today for no standing due to low BP despite compression hse/ wraps and abdominal binder.   Patient without symptoms seated at edge of bed and had obtained this position without assistance.  Transferred to wheelchair with multi squat pivot method.  Transported to sink to wash face and brush teeth.   Transported to tub shower to practice transfer with tub transfer bench. Patient able to recall instructions he had received previously for this transfer.  Patient able to transfer into and out of tub on transfer bench with CGA and verbal cueing.  Patient light headed after transfer, and tipped back in wheelchair.  Patient's BP as indicated.  Patient assisted back to bed where he opted to stay seated at edge for lunch.  Messaged medical team to reduce therapy requirements per day.  Call bell in reach, table in front, and bed alarm engaged.     Therapy Documentation Precautions:  Precautions Precautions: Fall, Sternal Recall of Precautions/Restrictions: Impaired Precaution/Restrictions Comments: watch BP Restrictions Weight Bearing Restrictions Per Provider Order: No Other Position/Activity Restrictions: Sternal precautions General: General PT Missed  Treatment Reason: Patient fatigue;Other (Comment) (hypotension) Vital Signs: Therapy Vitals Pulse Rate: (!) 53 BP: (!) 131/40 Patient Position (if appropriate): Sitting Pain:  Denies pain    Therapy/Group: Individual Therapy  Laural Eiland M 01/22/2024, 11:40 AM

## 2024-01-22 NOTE — Progress Notes (Addendum)
 "                                                        PROGRESS NOTE   Subjective/Complaints: Remains dizzy , low CBG this am as well , OT is using ACE wraps and Abd binder   ROS: +orthostasis, +dizziness- improved   Objective:   No results found.  Recent Labs    01/19/24 1152 01/21/24 0640  WBC 12.8* 13.2*  HGB 8.0* 7.2*  HCT 25.3* 21.9*  PLT 450* 422*   Recent Labs    01/19/24 1152 01/21/24 0640  NA 131* 128*  K 5.1 4.9  CL 93* 92*  CO2 21* 22  GLUCOSE 158* 52*  BUN 73* 94*  CREATININE 6.41* 7.30*  CALCIUM  8.9 8.5*    Intake/Output Summary (Last 24 hours) at 01/22/2024 0747 Last data filed at 01/22/2024 0700 Gross per 24 hour  Intake 791 ml  Output 1500 ml  Net -709 ml     Wound 01/18/24 1604 Pressure Injury Sacrum Unstageable - Full thickness tissue loss in which the base of the injury is covered by slough (yellow, tan, gray, green or brown) and/or eschar (tan, brown or black) in the wound bed. (Active)    Physical Exam: Vital Signs Blood pressure (!) 110/41, pulse (!) 54, temperature 98.2 F (36.8 C), temperature source Oral, resp. rate 16, height 6' (1.829 m), weight 133.4 kg, SpO2 99%.  General: No acute distress Mood and affect are appropriate Heart: Regular rate and rhythm no rubs murmurs or extra sounds Lungs: Clear to auscultation, breathing unlabored, no rales or wheezes Abdomen: Positive bowel sounds, soft nontender to palpation, nondistended Extremities: No clubbing, cyanosis, or edema Skin: No evidence of breakdown, no evidence of rash  Neuro: Alert and oriented x3, decreased sensation in bilateral feet. 5/5 strength throughout- stable 12/21  Assessment/Plan: 1. Functional deficits which require 3+ hours per day of interdisciplinary therapy in a comprehensive inpatient rehab setting. Physiatrist is providing close team supervision and 24 hour management of active medical problems listed below. Physiatrist and rehab team continue to  assess barriers to discharge/monitor patient progress toward functional and medical goals  Care Tool:  Bathing    Body parts bathed by patient: Chest, Left arm, Right arm, Abdomen, Front perineal area, Right upper leg, Left upper leg, Face   Body parts bathed by helper: Right lower leg, Left lower leg, Buttocks     Bathing assist Assist Level: Moderate Assistance - Patient 50 - 74%     Upper Body Dressing/Undressing Upper body dressing   What is the patient wearing?: Pull over shirt    Upper body assist Assist Level: Supervision/Verbal cueing    Lower Body Dressing/Undressing Lower body dressing      What is the patient wearing?: Pants     Lower body assist Assist for lower body dressing: Contact Guard/Touching assist     Toileting Toileting    Toileting assist Assist for toileting: Moderate Assistance - Patient 50 - 74%     Transfers Chair/bed transfer  Transfers assist     Chair/bed transfer assist level: Minimal Assistance - Patient > 75%     Locomotion Ambulation   Ambulation assist      Assist level: Minimal Assistance - Patient > 75% Assistive device: No Device Max distance: 20   Walk 10  feet activity   Assist     Assist level: Minimal Assistance - Patient > 75% Assistive device: No Device   Walk 50 feet activity   Assist Walk 50 feet with 2 turns activity did not occur: Safety/medical concerns (fatigue, SOB, dizziness, weakness/deconditioning)         Walk 150 feet activity   Assist Walk 150 feet activity did not occur: Safety/medical concerns (fatigue, SOB, dizziness, weakness/deconditioning)         Walk 10 feet on uneven surface  activity   Assist Walk 10 feet on uneven surfaces activity did not occur: Safety/medical concerns (fatigue, SOB, dizziness, weakness/deconditioning)         Wheelchair     Assist Is the patient using a wheelchair?: Yes Type of Wheelchair: Manual    Wheelchair assist level:  Dependent - Patient 0% Max wheelchair distance: 157ft    Wheelchair 50 feet with 2 turns activity    Assist        Assist Level: Dependent - Patient 0%   Wheelchair 150 feet activity     Assist      Assist Level: Dependent - Patient 0%   Blood pressure (!) 110/41, pulse (!) 54, temperature 98.2 F (36.8 C), temperature source Oral, resp. rate 16, height 6' (1.829 m), weight 133.4 kg, SpO2 99%.  Medical Problem List and Plan: 1. 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  2. Pain: d/c oxycodone , continue tramadol  for moderate pain.              --Continue lidocaine  patch to chest wall for local measures  3. Chronic insomnia: continue ambien   4. Multiple incisions: paint with betadine bid.              --Gerhardt's and foam dressing to partial thickness sacral wound. Juven added to promote wound healing.   5. Significant Orthostatic hypotension Impacting therapy: TEDs/Binder as continues to have BP drop but asymptomatic per OT notes             --Sitting 113/33-->stand 130/42--> 98/33 after sitting 30 mins.              --also has Hx of vertigo.  -increase midodrine  to 10mg  TID  6. 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  7.  T2DM: Hgb A1c- 9.9. Monitor BS ac/hs, Add CM restrictions to diet. Used Lantus  50 units PTA             --On Lantus  25 units BID   -decrease aspart to 6U with meals due to hypoglycemia. D/c ISS CBG (last 3)  Recent Labs    01/21/24 2132 01/22/24 0610 01/22/24 0707  GLUCAP 151* 58* 101*   Will reduce lantus  to 22U  8. ESRD: Off CRRT and back on MWF HD schedule. Continue fluid restriction  --schedule therapy at the end of the day to help with tolerance of therapy.   9.  Acute on chronic anemia: Hgb reviewed and was 9 on 12/9.  On Epo Recent values as below , no evidence of hematochezia some black stools will check stool guaic   10. Leucocytosis: Has had leucocytosis which continues to fluctuate             --12.2 at admission             Latest Ref Rng & Units 01/21/2024    6:40 AM 01/19/2024   11:52 AM 01/17/2024    3:51 AM  CBC  WBC 4.0 - 10.5 K/uL 13.2  12.8  15.4   Hemoglobin 13.0 - 17.0 g/dL 7.2  8.0  7.6   Hematocrit 39.0 - 52.0 % 21.9  25.3  23.7   Platelets 150 - 400 K/uL 422  450  323      11. Constipation: Change dulcolax to senna S 2 bid. LBM 12/20  12.  Low protein stores: Alb-2.52. continue Juven  13. Odynophagia: continue MMW  14. Class 2 Obesity: BMI- 37.4 on 12/20 Educate on diet and exercise to promote overall health and mobility.   15. Atelectasis: continue flutter valve.     LOS: 4 days A FACE TO FACE EVALUATION WAS PERFORMED  Timothy Holloway 01/22/2024, 7:47 AM     "

## 2024-01-22 NOTE — Progress Notes (Signed)
 Occupational Therapy Session Note  Patient Details  Name: Timothy Holloway MRN: [id number] Date of Birth: May 08, 1955  Today's Date: 01/22/2024 OT Individual Time: 9199-9143 OT Individual Time Calculation (min): 56 min    Short Term Goals: Week 1:  OT Short Term Goal 1 (Week 1): Pt will complete LB with AE PRN with CGA OT Short Term Goal 2 (Week 1): Pt will complete toilet transfer with CGA using LRAD OT Short Term Goal 3 (Week 1): Pt will adhere to sternal precautions 90% of the time during functional ADL task with min cues  Skilled Therapeutic Interventions/Progress Updates:    Patient received supine in bed.  Patient reports no pain currently, but earlier experienced pain in his chest - joint/muscle pain per his report.  Patient agreeable to OT, declined need to void.  Reported having HD yesterday - outside of M/W/F schedule.  Patient with significant pitting edema in lower legs and feet.  Patient with broken toenail on right foot - unable to feel toes.  Applied compression stockings, abdominal binder and ace wraps to manage swelling and BP.  Patient with either too low systolic or diastolic pressure this session and remained symptomatic.   Patient able to sit at edge of bed, and don shorts with close supervision.  He could stand to pull up shorts with rollator in front and contact guard assist.  Patient short of breath with all transitional movements.  Patient returned to supine at end of session to rest prior to PT.  Bed alarm engaged and personal items/ call bell in reach.     Therapy Documentation Precautions:  Precautions Precautions: Fall, Sternal Recall of Precautions/Restrictions: Impaired Precaution/Restrictions Comments: watch BP Restrictions Weight Bearing Restrictions Per Provider Order: No Other Position/Activity Restrictions: Sternal precautions   Vital Signs: BP:  Supine:  120/35, Sitting with compression hose:  98/37, with abdominal binder and ace wrap over ted  hose- 114/30 - symptomatic - light headed.   Pain: Pain Assessment Pain Scale: 0-10 Pain Score: 6  Pain Type: Acute pain Pain Location: Chest Pain Orientation: Medial Pain Frequency: Constant Pain Onset: Gradual Pain Intervention(s): Medication (See eMAR)    Therapy/Group: Individual Therapy  Jadda Hunsucker M 01/22/2024, 8:58 AM

## 2024-01-22 NOTE — Progress Notes (Addendum)
 " [facility] Progress Note   Subjective:   Seen in room - working with PT. Still feels a little dizziness, esp with standing. No CP/dyspnea.  Objective Vitals:   01/21/24 1750 01/21/24 1932 01/22/24 0614 01/22/24 0629  BP: (!) 108/46 (!) 121/45 (!) 108/31 (!) 110/41  Pulse: 60 (!) 59 (!) 57 (!) 54  Resp: 18 20 16    Temp:  98.5 F (36.9 C) 98.2 F (36.8 C)   TempSrc:  Oral Oral Oral  SpO2: 96% 97% 99%   Weight:   133.4 kg   Height:       Physical Exam General: Well appearing, NAD. Room air Heart: RRR Lungs: CTAB Abdomen: abd binder in place Extremities: 1+ BLE edema Dialysis Access: [facility] in R chest  Additional Objective Labs: Basic Metabolic Panel: Recent Labs  Lab 01/16/24 0152 01/17/24 0351 01/19/24 1152 01/21/24 0640  NA 133* 133* 131* 128*  K 4.0 4.9 5.1 4.9  CL 97* 95* 93* 92*  CO2 26 25 21* 22  GLUCOSE 151* 125* 158* 52*  BUN 50* 74* 73* 94*  CREATININE 4.20* 5.89* 6.41* 7.30*  CALCIUM  7.9* 8.9 8.9 8.5*  PHOS 3.3  --  5.6* 6.4*   Liver Function Tests: Recent Labs  Lab 01/16/24 0152 01/19/24 1152 01/21/24 0640  ALBUMIN  2.5* 3.1* 3.1*   CBC: Recent Labs  Lab 01/17/24 0351 01/19/24 1152 01/21/24 0640  WBC 15.4* 12.8* 13.2*  NEUTROABS  --  9.7* 10.0*  HGB 7.6* 8.0* 7.2*  HCT 23.7* 25.3* 21.9*  MCV 96.3 96.9 94.4  PLT 323 450* 422*   Medications:   aspirin  EC  81 mg Oral Daily   atorvastatin   80 mg Oral Daily   Chlorhexidine  Gluconate Cloth  6 each Topical Q12H   clopidogrel   75 mg Oral Daily   collagenase    Topical Daily   [START ON 01/25/2024] darbepoetin (ARANESP ) injection - DIALYSIS  150 mcg Subcutaneous Q Thu-1800   Gerhardt's butt cream   Topical Daily   heparin   5,000 Units Subcutaneous Q8H   insulin  aspart  6 Units Subcutaneous TID WC   insulin  glargine  22 Units Subcutaneous BID   lidocaine   2 patch Transdermal Q24H   magic mouthwash  5 mL Oral QID   midodrine   10 mg Oral TID WC   multivitamin  1 tablet Oral  QHS   nutrition supplement (JUVEN)  1 packet Oral BID BM   pantoprazole   40 mg Oral Daily   senna-docusate  2 tablet Oral BID   sodium chloride  flush  10-40 mL Intracatheter Q12H   zolpidem   5 mg Oral QHS    Dialysis Orders MWF - [facility] 4h B350  133.5kg  TDC  Heparin  2000+ 1400u/hr Calcitriol 1.25 three times per week Mircera 90 mcg q 2 wks (last 12/1)    CXR 12/11-> resolved vasc congestion, improved IS edema   Assessment/Plan: ESRD - usual HD MWF. CRRT used from 12/10 - 12/13 for BP stability in the face of stroke-like symptoms. Stable off of CRRT. Next HD tomorrow - per holiday schedule. BP/volume: Now on midodrine  10mg  TID, occ dizziness with standing. Does have some LE edema. 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 May benefit from transfusion. Tsat 10% - will give course of IV iron .  Secondary HPTH: Ca ok, Phos high - restart Velphoro  NSTEMI - 3V CAD by [facility] -> s/p CABG 01/08/24 Stroke-like symptoms: was waxing/ waning per neuro. There was no acute CVA  by imaging, but due to advanced carotid disease by CTA pt underwent L carotid artery stent per [facility] 12/10.  Nutrition: Alb low, on supplements.  ** Holiday Schedule for THIS week** Usual MWF schedule -> Sun (21), Tues (23), Friday (26) Usual TTS schedule -> Mon (22), Wed (24), Sat (27)   Katie Katilyn Miltenberger, PA-C 01/22/2024, 8:52 AM  [facility]    "

## 2024-01-22 NOTE — Progress Notes (Signed)
 Physical Therapy Session Note  Patient Details  Name: Timothy Holloway MRN: [id number] Date of Birth: 1955/05/17  Today's Date: 01/22/2024 PT Individual Time: 9140-9062 PT Individual Time Calculation (min): 38 min  and Today's Date: 01/22/2024 PT Missed Time: 37 Minutes Missed Time Reason: Patient fatigue;Other (Comment) (hypotension)  Short Term Goals: Week 1:  PT Short Term Goal 1 (Week 1): pt will perform bed mobility with supervision using bed features PT Short Term Goal 2 (Week 1): pt will perform all transfers with LRAD and CGA PT Short Term Goal 3 (Week 1): pt will ambulate 31ft with LRAD and CGA  Skilled Therapeutic Interventions/Progress Updates:      Pt resting in bed with abdominal binder on and knee-high compression socks with added ace wrapping. Pt reporting feeling slightly dizzy and has no complaints of pain.   Supine<>Sitting EOB with supervision with cues for sternal precautions. Sit<>Stand and stand pivot transfer using rollator from elevated bed height with CGA.   Pt transported to main gym at w/c level for energy conservation. BP checked before mobility training: Sitting: 122/42 HR 56 Standing: 71/58 HR 67 *pt reporting 7/10 dizziness. Pt returned to TIS w/c and reclined to relieve symptoms.  *BP rechecked afte ~5 minutes of being reclined - 94/68 HR 57  Setup mat table to transfer patient to complete supine there-ex for BLE strengthening. Was patient returned upright, pt feeling unwell and more dizzy. Deferred further therapy due to hypotension and symptoms.   Pt returned to his room and assisted to bed with minA stand pivot transfer using RW. MinA needed for lifting BLE into bed. All needs met at end. Pt missed 37 minutes due to fatigue and hypotension. Will attempt to make up time as able.    Therapy Documentation Precautions:  Precautions Precautions: Fall, Sternal Recall of Precautions/Restrictions: Impaired Precaution/Restrictions Comments: watch  BP Restrictions Weight Bearing Restrictions Per Provider Order: No Other Position/Activity Restrictions: Sternal precautions General:       Therapy/Group: Individual Therapy  Sherlean SHAUNNA Perks 01/22/2024, 7:40 AM

## 2024-01-23 LAB — GLUCOSE, CAPILLARY
Glucose-Capillary: 127 mg/dL — ABNORMAL HIGH (ref 70–99)
Glucose-Capillary: 122 mg/dL — ABNORMAL HIGH (ref 70–99)
Glucose-Capillary: 77 mg/dL (ref 70–99)
Glucose-Capillary: 163 mg/dL — ABNORMAL HIGH (ref 70–99)

## 2024-01-23 MED ORDER — MIDODRINE HCL 5 MG PO TABS
15.0000 mg | ORAL_TABLET | Freq: Three times a day (TID) | ORAL | Status: DC
  Administered 2024-01-23 – 2024-01-29 (×16): 15 mg via ORAL
  Filled 2024-01-23 (×17): qty 3

## 2024-01-23 MED ORDER — HEPARIN SODIUM (PORCINE) 1000 UNIT/ML DIALYSIS: 3000.0000 [IU] | Freq: Once | INTRAMUSCULAR | Status: DC

## 2024-01-23 MED ORDER — ALTEPLASE 2 MG IJ SOLR: 2.0000 mg | Freq: Once | INTRAMUSCULAR | Status: DC | PRN

## 2024-01-23 MED ORDER — HEPARIN SODIUM (PORCINE) 1000 UNIT/ML DIALYSIS: 1000.0000 [IU] | INTRAMUSCULAR | Status: DC | PRN

## 2024-01-23 MED ORDER — LIDOCAINE-PRILOCAINE 2.5-2.5 % EX CREA: 1.0000 | TOPICAL_CREAM | CUTANEOUS | Status: DC | PRN

## 2024-01-23 MED ORDER — PENTAFLUOROPROP-TETRAFLUOROETH EX AERO: 1.0000 | INHALATION_SPRAY | CUTANEOUS | Status: DC | PRN

## 2024-01-23 MED ORDER — LIDOCAINE HCL (PF) 1 % IJ SOLN: 5.0000 mL | INTRAMUSCULAR | Status: DC | PRN

## 2024-01-23 NOTE — Progress Notes (Signed)
 Physical Therapy Session Note  Patient Details  Name: Timothy Holloway MRN: [id number] Date of Birth: 1955-05-07  Today's Date: 01/23/2024 PT Individual Time: 0800-0855 + 11-115 PT Individual Time Calculation (min): 55 min  + 15 min (missed 45 minutes)  Short Term Goals: Week 1:  PT Short Term Goal 1 (Week 1): pt will perform bed mobility with supervision using bed features PT Short Term Goal 2 (Week 1): pt will perform all transfers with LRAD and CGA PT Short Term Goal 3 (Week 1): pt will ambulate 52ft with LRAD and CGA  Skilled Therapeutic Interventions/Progress Updates:      1st session: Pt sitting EOB on arrival. No compression socks or ace wrapped legs, but is wearing abd binder. Pt reports feeling mildly dizzy but has no pain.   BP sitting w/ abd binder only:  BP: 122/29, HR 58  Donned thigh-high compression + ace wrapping: BP: 92/72 HR 60  BP rechecked: 117/47 HR 58  Pt assisted with donning pants with minA for threading only. Able to pull them up in standing with CGA for balance support. Needs raised bed height to stand comfortably. Stand step transfer with RW and CGA from bed to TIS w/c. Removed dirty shirt with assist and donned clean shirt. Redonned abdominal binder with better tension.   Transported patient to main gym for energy conservation.   Sit<>stand from TIS w/c with minA due to posterior LOB with inability to self correct. Needs cues for hand placement to push from w/c armrests rather than pull from RW. Once balance obtained, able to ambulate up to 20' with CGA and RW with decreased gait speed and small stride length. Distance limited by lightheadedness > weakness.   Pt instructed in therapeutic activity to promote upright tolerance, general strengthening, and mobility progressions: -2x1 minute of static standing marching with RW support, CGA -2x1 minute of static standing heel raises with RW support, CGA *extended seated rest breaks b/w sets due to fatigue.    Pt ambulated 20' with CGA and RW back to his w/c with similar deficits as above, cues for forward gazed and increasing his speed.   Returned to his room and encouraged patient to stay upright in TIS w/c if able, therapy upcoming in ~45 minutes. Pt agreeable. Pt ended session with all needs met.   2nd session: Pt in bathroom with nursing care, applying dressing change for his bottom. Direct handoff of care to PT - pt denies any specific pains during treatment.   Pt ambulating from bathroom to TIS w/c in his room with CGA and RW. Pt reporting that he feels he may pass out. Pt reclined in TIS w/c and legs elevated. BP checked: 124/42 HR 55. Pt reporting feeling cold and clammy. Assisted patient back to bed with minA stand pivot transfer. ModA for returning to supine position for BLE management. BP rechecked in bed: 119/38 HR 54.  Deferred further therapy due to symptoms and hypotension. He missed 45 minutes of treatment.      Therapy Documentation Precautions:  Precautions Precautions: Fall, Sternal Recall of Precautions/Restrictions: Impaired Precaution/Restrictions Comments: watch BP Restrictions Weight Bearing Restrictions Per Provider Order: No Other Position/Activity Restrictions: Sternal precautions General:   Vital Signs:    Therapy/Group: Individual Therapy  Sherlean SHAUNNA Perks 01/23/2024, 7:44 AM

## 2024-01-23 NOTE — Plan of Care (Signed)
" °  Problem: Education: Goal: Ability to describe self-care measures that may prevent or decrease complications (Diabetes Survival Skills Education) will improve Outcome: Progressing Goal: Individualized Educational Video(s) Outcome: Progressing   Problem: Coping: Goal: Ability to adjust to condition or change in health will improve Outcome: Progressing   Problem: Fluid Volume: Goal: Ability to maintain a balanced intake and output will improve Outcome: Progressing   Problem: Health Behavior/Discharge Planning: Goal: Ability to identify and utilize available resources and services will improve Outcome: Progressing Goal: Ability to manage health-related needs will improve Outcome: Progressing   Problem: Metabolic: Goal: Ability to maintain appropriate glucose levels will improve Outcome: Progressing   Problem: Nutritional: Goal: Maintenance of adequate nutrition will improve Outcome: Progressing Goal: Progress toward achieving an optimal weight will improve Outcome: Progressing   Problem: Skin Integrity: Goal: Risk for impaired skin integrity will decrease Outcome: Progressing   Problem: Tissue Perfusion: Goal: Adequacy of tissue perfusion will improve Outcome: Progressing   Problem: Consults Goal: RH STROKE PATIENT EDUCATION Description: See Patient Education module for education specifics  Outcome: Progressing Goal: Nutrition Consult-if indicated Outcome: Progressing Goal: Diabetes Guidelines if Diabetic/Glucose > 140 Description: If diabetic or lab glucose is > 140 mg/dl - Initiate Diabetes/Hyperglycemia Guidelines & Document Interventions  Outcome: Progressing   Problem: RH BOWEL ELIMINATION Goal: RH STG MANAGE BOWEL WITH ASSISTANCE Description: STG Manage Bowel with Assistance. Outcome: Progressing Goal: RH STG MANAGE BOWEL W/MEDICATION W/ASSISTANCE Description: STG Manage Bowel with Medication with Assistance. Outcome: Progressing   Problem: RH SAFETY Goal:  RH STG ADHERE TO SAFETY PRECAUTIONS W/ASSISTANCE/DEVICE Description: STG Adhere to Safety Precautions With Assistance/Device. Outcome: Progressing   Problem: RH PAIN MANAGEMENT Goal: RH STG PAIN MANAGED AT OR BELOW PT'S PAIN GOAL Outcome: Progressing   Problem: RH KNOWLEDGE DEFICIT Goal: RH STG INCREASE KNOWLEDGE OF DIABETES Outcome: Progressing Goal: RH STG INCREASE KNOWLEDGE OF HYPERTENSION Outcome: Progressing Goal: RH STG INCREASE KNOWLEGDE OF HYPERLIPIDEMIA Outcome: Progressing Goal: RH STG INCREASE KNOWLEDGE OF STROKE PROPHYLAXIS Outcome: Progressing   "

## 2024-01-23 NOTE — Progress Notes (Signed)
 2.5 Liters ultrafiltration, condition stable , report given to the primary RN, and this patient was transported without complication to 4M01C-01.

## 2024-01-23 NOTE — Progress Notes (Signed)
 " [facility] Progress Note   Subjective:   Seen in room. BP better today, pt reports dizziness is improving. No CP/abd pain today.  Objective Vitals:   01/22/24 1139 01/22/24 1519 01/22/24 2025 01/23/24 0600  BP: (!) 131/40 (!) 125/39 (!) 155/87 (!) 113/44  Pulse:  (!) 58 60 (!) 54  Resp:  18 16 16   Temp:  97.7 F (36.5 C) 97.9 F (36.6 C) 98.1 F (36.7 C)  TempSrc:    Oral  SpO2:  100% 100% 100%  Weight:      Height:       Physical Exam General: Well appearing, NAD. Room air Heart: RRR Lungs: CTAB Abdomen: abd binder in place Extremities: 1+ BLE edema Dialysis Access: [facility] in R chest   Additional Objective Labs: Basic Metabolic Panel: Recent Labs  Lab 01/17/24 0351 01/19/24 1152 01/21/24 0640  NA 133* 131* 128*  K 4.9 5.1 4.9  CL 95* 93* 92*  CO2 25 21* 22  GLUCOSE 125* 158* 52*  BUN 74* 73* 94*  CREATININE 5.89* 6.41* 7.30*  CALCIUM  8.9 8.9 8.5*  PHOS  --  5.6* 6.4*   Liver Function Tests: Recent Labs  Lab 01/19/24 1152 01/21/24 0640  ALBUMIN  3.1* 3.1*   CBC: Recent Labs  Lab 01/17/24 0351 01/19/24 1152 01/21/24 0640  WBC 15.4* 12.8* 13.2*  NEUTROABS  --  9.7* 10.0*  HGB 7.6* 8.0* 7.2*  HCT 23.7* 25.3* 21.9*  MCV 96.3 96.9 94.4  PLT 323 450* 422*   Medications:  iron  sucrose      aspirin  EC  81 mg Oral Daily   atorvastatin   80 mg Oral Daily   Chlorhexidine  Gluconate Cloth  6 each Topical Q12H   clopidogrel   75 mg Oral Daily   collagenase    Topical Daily   [START ON 01/25/2024] darbepoetin (ARANESP ) injection - DIALYSIS  150 mcg Subcutaneous Q Thu-1800   Gerhardt's butt cream   Topical Daily   heparin   5,000 Units Subcutaneous Q8H   insulin  aspart  6 Units Subcutaneous TID WC   insulin  glargine  22 Units Subcutaneous BID   lidocaine   2 patch Transdermal Q24H   magic mouthwash  5 mL Oral QID   midodrine   10 mg Oral TID WC   multivitamin  1 tablet Oral QHS   nutrition supplement (JUVEN)  1 packet Oral BID BM    pantoprazole   40 mg Oral Daily   senna-docusate  2 tablet Oral BID   sodium chloride  flush  10-40 mL Intracatheter Q12H   sucroferric oxyhydroxide  500 mg Oral TID WC   zolpidem   5 mg Oral QHS    Dialysis Orders MWF - [facility] 4h B350  133.5kg  TDC  Heparin  2000+ 1400u/hr Calcitriol 1.25 three times per week Mircera 90 mcg q 2 wks (last 12/1)    CXR 12/11-> resolved vasc congestion, improved IS edema   Assessment/Plan: ESRD - usual HD MWF. CRRT used from 12/10 - 12/13 for BP stability in the face of stroke-like symptoms. Stable off of CRRT. Next HD today - per holiday schedule. BP/volume: Now on midodrine  10mg  TID, occ dizziness with standing. Does have LE edema. Ok to ^ mido if needed - he is less symptomatic today. Anemia of ESRD: Last Hgb 7.2 - on Aranesp  150mcg q Thursday (just raised). May benefit from transfusion. Tsat 10% - have ordered IV iron  today. Secondary HPTH: Ca ok, Phos high - restarted Velphoro  NSTEMI - 3V CAD by [facility] -> s/p CABG 01/08/24  Stroke-like symptoms: was waxing/ waning per neuro. There was no acute CVA by imaging, but due to advanced carotid disease by CTA pt underwent L carotid artery stent per [facility] 12/10.  Nutrition: Alb low, on supplements.   ** Holiday Schedule for THIS week** Usual MWF schedule -> Sun (21), Tues (23), Friday (26) Usual TTS schedule -> Mon (22), Wed (24), Sat (27)   Katie Chirsty Armistead, PA-C 01/23/2024, 9:08 AM   [facility]    "

## 2024-01-23 NOTE — Progress Notes (Signed)
 "                                                        PROGRESS NOTE   Subjective/Complaints: Continues to be dizzy and hypotensive, discussed with nephrology who recommended increasing midodrine  to 15mg  TID, ordered  ROS: +orthostasis, +dizziness- continues   Objective:   No results found.  Recent Labs    01/21/24 0640  WBC 13.2*  HGB 7.2*  HCT 21.9*  PLT 422*   Recent Labs    01/21/24 0640  NA 128*  K 4.9  CL 92*  CO2 22  GLUCOSE 52*  BUN 94*  CREATININE 7.30*  CALCIUM  8.5*    Intake/Output Summary (Last 24 hours) at 01/23/2024 1041 Last data filed at 01/23/2024 0801 Gross per 24 hour  Intake 710 ml  Output 400 ml  Net 310 ml     Wound 01/18/24 1604 Pressure Injury Sacrum Unstageable - Full thickness tissue loss in which the base of the injury is covered by slough (yellow, tan, gray, green or brown) and/or eschar (tan, brown or black) in the wound bed. (Active)    Physical Exam: Vital Signs Blood pressure (!) 113/44, pulse (!) 54, temperature 98.1 F (36.7 C), temperature source Oral, resp. rate 16, height 6' (1.829 m), weight 133.4 kg, SpO2 100%.  General: No acute distress Mood and affect are appropriate Heart: Bradycardia Lungs: Clear to auscultation, breathing unlabored, no rales or wheezes Abdomen: Positive bowel sounds, soft nontender to palpation, nondistended Extremities: No clubbing, cyanosis, or edema Skin: No evidence of breakdown, no evidence of rash  Neuro: Alert and oriented x3, decreased sensation in bilateral feet. 5/5 strength throughout- stable 12/23  Assessment/Plan: 1. Functional deficits which require 3+ hours per day of interdisciplinary therapy in a comprehensive inpatient rehab setting. Physiatrist is providing close team supervision and 24 hour management of active medical problems listed below. Physiatrist and rehab team continue to assess barriers to discharge/monitor patient progress toward functional and medical  goals  Care Tool:  Bathing    Body parts bathed by patient: Chest, Left arm, Right arm, Abdomen, Front perineal area, Right upper leg, Left upper leg, Face   Body parts bathed by helper: Right lower leg, Left lower leg, Buttocks     Bathing assist Assist Level: Moderate Assistance - Patient 50 - 74%     Upper Body Dressing/Undressing Upper body dressing   What is the patient wearing?: Pull over shirt    Upper body assist Assist Level: Supervision/Verbal cueing    Lower Body Dressing/Undressing Lower body dressing      What is the patient wearing?: Pants     Lower body assist Assist for lower body dressing: Contact Guard/Touching assist     Toileting Toileting    Toileting assist Assist for toileting: Moderate Assistance - Patient 50 - 74%     Transfers Chair/bed transfer  Transfers assist     Chair/bed transfer assist level: Minimal Assistance - Patient > 75%     Locomotion Ambulation   Ambulation assist      Assist level: Minimal Assistance - Patient > 75% Assistive device: No Device Max distance: 20   Walk 10 feet activity   Assist     Assist level: Minimal Assistance - Patient > 75% Assistive device: No Device   Walk 50 feet activity  Assist Walk 50 feet with 2 turns activity did not occur: Safety/medical concerns (fatigue, SOB, dizziness, weakness/deconditioning)         Walk 150 feet activity   Assist Walk 150 feet activity did not occur: Safety/medical concerns (fatigue, SOB, dizziness, weakness/deconditioning)         Walk 10 feet on uneven surface  activity   Assist Walk 10 feet on uneven surfaces activity did not occur: Safety/medical concerns (fatigue, SOB, dizziness, weakness/deconditioning)         Wheelchair     Assist Is the patient using a wheelchair?: Yes Type of Wheelchair: Manual    Wheelchair assist level: Dependent - Patient 0% Max wheelchair distance: 14ft    Wheelchair 50 feet with 2  turns activity    Assist        Assist Level: Dependent - Patient 0%   Wheelchair 150 feet activity     Assist      Assist Level: Dependent - Patient 0%   Blood pressure (!) 113/44, pulse (!) 54, temperature 98.1 F (36.7 C), temperature source Oral, resp. rate 16, height 6' (1.829 m), weight 133.4 kg, SpO2 100%.  Medical Problem List and Plan: 1. 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  2. Pain: d/c oxycodone , continue tramadol  for moderate pain.              --Continue lidocaine  patch to chest wall for local measures  3. Chronic insomnia: continue ambien   4. Multiple incisions: paint with betadine bid.              --Gerhardt's and foam dressing to partial thickness sacral wound. Juven added to promote wound healing.   5. Significant Orthostatic hypotension Impacting therapy: TEDs/Binder as continues to have BP drop but asymptomatic per OT notes             --Sitting 113/33-->stand 130/42--> 98/33 after sitting 30 mins.              --also has Hx of vertigo.  -increase midodrine  to 15mg  TID  6. 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  7.  T2DM: Hgb A1c- 9.9. Monitor BS ac/hs, Add CM restrictions to diet. Used Lantus  50 units PTA             --On Lantus  25 units BID   -decrease aspart to 6U with meals due to hypoglycemia. D/c ISS CBG (last 3)  Recent Labs    01/22/24 1643 01/22/24 2025 01/23/24 0620  GLUCAP 132* 163* 127*   Will reduce lantus  to 22U  8. ESRD: Off CRRT and back on MWF HD schedule. Continue fluid restriction --schedule therapy at the end of the day to help with tolerance of therapy.    9.  Acute on chronic anemia: Hgb reviewed and was 9 on 12/9. On Epo Recent values as below , no evidence of hematochezia some black stools will check stool guaic   10. Leucocytosis: Has had leucocytosis which continues to fluctuate             --  12.2 at admission             Latest Ref Rng & Units 01/21/2024    6:40 AM 01/19/2024   11:52 AM 01/17/2024    3:51 AM  CBC  WBC 4.0 - 10.5 K/uL 13.2  12.8  15.4   Hemoglobin 13.0 - 17.0 g/dL 7.2  8.0  7.6   Hematocrit 39.0 - 52.0 % 21.9  25.3  23.7   Platelets 150 - 400 K/uL 422  450  323      11. Constipation: Change dulcolax to senna S 2 bid. LBM 12/22, d/c suppository  12.  Low protein stores: Albumin  reviewed and is improving, d/c Juven  13. Odynophagia: continue MMW, messaged nursing to see if this is still an issue  14. Class 2 Obesity: BMI- 37.4 on 12/20 Educate on diet and exercise to promote overall health and mobility.   15. Atelectasis: continue flutter valve.     LOS: 5 days A FACE TO FACE EVALUATION WAS PERFORMED  Timothy Holloway Timothy Holloway 01/23/2024, 10:41 AM     "

## 2024-01-23 NOTE — Progress Notes (Signed)
 Occupational Therapy Session Note  Patient Details  Name: Timothy Holloway MRN: [id number] Date of Birth: December 15, 1955  Today's Date: 01/23/2024 OT Individual Time: 9064-8964 OT Individual Time Calculation (min): 60 min    Short Term Goals: Week 1:  OT Short Term Goal 1 (Week 1): Pt will complete LB with AE PRN with CGA OT Short Term Goal 2 (Week 1): Pt will complete toilet transfer with CGA using LRAD OT Short Term Goal 3 (Week 1): Pt will adhere to sternal precautions 90% of the time during functional ADL task with min cues  Skilled Therapeutic Interventions/Progress Updates:   Patient agreeable to participate in OT session. Reports no pain level.   Patient participated in skilled OT session focusing on standing tolerance, functional mobility,  activity tolerance, and BP management. Patient received in chair ready for OT. Patient completed sit to stand from wc x5 with one minute stands to increase standing tolerance. BP demonstrated minimal orthostatic drops with standing and able to tolerate standing with minimal dizziness. Patient BP 107/42 sitting, 95/55 standing, 113/38 sitting, 89/39. Patient then completed functional mobility 15 ft with good skill, with patient complaints of knees buckling. Patient able to complete 3 minute bouts on NuStep level one before requiring 2 minute break due to fatigue. Patient able to complete 3x3 minute bouts of NuSTep to increase activity tolerance related to functional mobility and ADL participation. Patient required increased time to monitor vitals due to low BP in previous sessions. Returned to room following via wc transport all needs in reach.    Therapy Documentation Precautions:  Precautions Precautions: Fall, Sternal Recall of Precautions/Restrictions: Impaired Precaution/Restrictions Comments: watch BP Restrictions Weight Bearing Restrictions Per Provider Order: No Other Position/Activity Restrictions: Sternal precautions  Therapy/Group:  Individual Therapy  D'mariea L Mikhia Dusek 01/23/2024, 7:29 AM

## 2024-01-24 LAB — CBC WITH DIFFERENTIAL/PLATELET
Abs Immature Granulocytes: 0.1 K/uL — ABNORMAL HIGH (ref 0.00–0.07)
Basophils Absolute: 0 K/uL (ref 0.0–0.1)
Basophils Relative: 0 %
Eosinophils Absolute: 0.8 K/uL — ABNORMAL HIGH (ref 0.0–0.5)
Eosinophils Relative: 7 %
HCT: 22.1 % — ABNORMAL LOW (ref 39.0–52.0)
Hemoglobin: 7.1 g/dL — ABNORMAL LOW (ref 13.0–17.0)
Immature Granulocytes: 1 %
Lymphocytes Relative: 8 %
Lymphs Abs: 0.8 K/uL (ref 0.7–4.0)
MCH: 30.2 pg (ref 26.0–34.0)
MCHC: 32.1 g/dL (ref 30.0–36.0)
MCV: 94 fL (ref 80.0–100.0)
Monocytes Absolute: 1 K/uL (ref 0.1–1.0)
Monocytes Relative: 9 %
Neutro Abs: 7.8 K/uL — ABNORMAL HIGH (ref 1.7–7.7)
Neutrophils Relative %: 75 %
Platelets: 411 K/uL — ABNORMAL HIGH (ref 150–400)
RBC: 2.35 MIL/uL — ABNORMAL LOW (ref 4.22–5.81)
RDW: 15.8 % — ABNORMAL HIGH (ref 11.5–15.5)
WBC: 10.5 K/uL (ref 4.0–10.5)
nRBC: 0 % (ref 0.0–0.2)

## 2024-01-24 LAB — GLUCOSE, CAPILLARY
Glucose-Capillary: 121 mg/dL — ABNORMAL HIGH (ref 70–99)
Glucose-Capillary: 118 mg/dL — ABNORMAL HIGH (ref 70–99)
Glucose-Capillary: 124 mg/dL — ABNORMAL HIGH (ref 70–99)
Glucose-Capillary: 155 mg/dL — ABNORMAL HIGH (ref 70–99)

## 2024-01-24 LAB — RENAL FUNCTION PANEL
Albumin: 3.2 g/dL — ABNORMAL LOW (ref 3.5–5.0)
Anion gap: 11 (ref 5–15)
BUN: 46 mg/dL — ABNORMAL HIGH (ref 8–23)
CO2: 27 mmol/L (ref 22–32)
Calcium: 8.4 mg/dL — ABNORMAL LOW (ref 8.9–10.3)
Chloride: 90 mmol/L — ABNORMAL LOW (ref 98–111)
Creatinine, Ser: 4.58 mg/dL — ABNORMAL HIGH (ref 0.61–1.24)
GFR, Estimated: 13 mL/min — ABNORMAL LOW
Glucose, Bld: 121 mg/dL — ABNORMAL HIGH (ref 70–99)
Phosphorus: 4.1 mg/dL (ref 2.5–4.6)
Potassium: 4 mmol/L (ref 3.5–5.1)
Sodium: 128 mmol/L — ABNORMAL LOW (ref 135–145)

## 2024-01-24 LAB — OCCULT BLOOD X 1 CARD TO LAB, STOOL
Fecal Occult Bld: NEGATIVE
Fecal Occult Bld: POSITIVE — AB

## 2024-01-24 LAB — PREPARE RBC (CROSSMATCH)

## 2024-01-24 MED ORDER — SORBITOL 70 % SOLN
30.0000 mL | Freq: Once | Status: AC
  Administered 2024-01-24: 30 mL via ORAL
  Filled 2024-01-24: qty 30

## 2024-01-24 MED ORDER — VITAMIN D 25 MCG (1000 UNIT) PO TABS
1000.0000 [IU] | ORAL_TABLET | Freq: Every day | ORAL | Status: DC
  Administered 2024-01-24 – 2024-02-03 (×11): 1000 [IU] via ORAL
  Filled 2024-01-24 (×6): qty 1

## 2024-01-24 MED ORDER — SODIUM CHLORIDE 0.9% IV SOLUTION: Freq: Once | INTRAVENOUS | Status: AC

## 2024-01-24 NOTE — Progress Notes (Signed)
 Physical Therapy Session Note  Patient Details  Name: Timothy Holloway MRN: [id number] Date of Birth: [birth year]  Today's Date: 01/24/2024 PT Individual Time: 0932-1045 PT Individual Time Calculation (min): 73 min   Short Term Goals: Week 1:  PT Short Term Goal 1 (Week 1): pt will perform bed mobility with supervision using bed features PT Short Term Goal 2 (Week 1): pt will perform all transfers with LRAD and CGA PT Short Term Goal 3 (Week 1): pt will ambulate 37ft with LRAD and CGA  Skilled Therapeutic Interventions/Progress Updates: Pt presents semi-reclined in bed and agreeable to therapy.  Pt states some soreness to L knee w/ activity.  Pt transfers sup to sit w/ min A.  Pt abd binder donned in sitting , TEDS and aces already donned.  Pt transferred sit to stand w/ min A from elevated bed, although requires 2 attempts to stand, and cues for breathing.  Pt amb w/ rollator and min/CGA to small gym.  Pt states 1/10 dizziness and BP at 133/49, HR 59.  Pt states wish to attend sing-along.  Pt amb x 120' before fatiguing and resting on rollator seat.  PT wheeled rest of distance to Dayroom.  Pt performed LE there ex as well as standing balance holding songlist w/ B hands up to 2-3 while singing.  Pt returned to room and amb to bed w/ rollator and CGA.  Pt required min A for LES sit to supine.  Ace wraps and TED hose doffed.  Bed alarm on and all needs in reach.     Therapy Documentation Precautions:  Precautions Precautions: Fall, Sternal Recall of Precautions/Restrictions: Impaired Precaution/Restrictions Comments: watch BP Restrictions Weight Bearing Restrictions Per Provider Order: No Other Position/Activity Restrictions: Sternal precautions General:   Vital Signs: Therapy Vitals Temp: 97.6 F (36.4 C) Pulse Rate: 62 Resp: 17 BP: (!) 82/46 (no binder or compression hose) Patient Position (if appropriate): Sitting Oxygen Therapy SpO2: (!) 86 % O2 Device: Room  Air Pain: Pain Assessment Pain Scale: 0-10 Pain Score: 4  Pain Type: Acute pain Pain Location: Chest Pain Orientation: Left Pain Descriptors / Indicators: Throbbing Pain Onset: On-going Patients Stated Pain Goal: 0 Pain Intervention(s): Repositioned    Therapy/Group: Individual Therapy  Benyamin Jeff P Tobyn Osgood 01/24/2024, 10:53 AM

## 2024-01-24 NOTE — Patient Care Conference (Signed)
 Inpatient RehabilitationTeam Conference and Plan of Care Update Date: 01/24/2024   Time: 11:08 AM    Patient Name: Timothy Holloway      Medical Record Number: [id number]  Date of Birth: [birth year] Sex: Male         Room/Bed: 4M01C/4M01C-01 Payor Info: Payor: [employer] / Plan: [employer] PPO / Product Type: *No Product type* /    Admit Date/Time:  01/18/2024  4:05 PM  Primary Diagnosis:  Embolic stroke [facility])  Hospital Problems: Principal Problem:   Embolic stroke [place])    Expected Discharge Date: Expected Discharge Date: 02/02/24  Team Members Present: Physician leading conference: Dr. Sven Elks Social Worker Present: Waverly Gentry, LCSW-A Nurse Present: Barnie Ronde, RN PT Present: Therisa Stains, PT OT Present: Monica Peacock, OT     Current Status/Progress Goal Weekly Team Focus  Bowel/Bladder   pt continent of b/b   Remain continent   Assess qhshift and prn    Swallow/Nutrition/ Hydration               ADL's   Mod assist lower body care, dependent for compression hose, poor endurance/ tolerance to upright   Mod I   Improve tolerance to upright, improve endurance, improve lower body self care and functional mobility    Mobility   Flat affect, doesn't provide much insight on his symptoms. minA bed mobility (for BLE management, CGA/minA sit<>stands to RW, gait ~20' with RW. Distance and therapy progression limited by hypotension. Has been wearing knee high compression socks + ace wrap + abd binder. In TIS w/c due to labile BP   mod I (may need to downgrade to supervision given labile BP)  activity tolerance, mobility progression, general strengthening and upright progressions    Communication                Safety/Cognition/ Behavioral Observations               Pain   pt c/o right abd pain, prn meds given   <3 pain score   Assess qshift and prn    Skin   Chest incision, healing and ota, see wound care orders, Lower abd  bruising bilateral   Promote healing  Assess qshift and prn      Discharge Planning:  Plans to discharge home with wife providing support and care. Lives close to [place] and [place].  Awaiting therapy follow up recommendations.   Team Discussion: Patient admitted post CVA with soft BP  and dizziness (calciphylaxis) as not correlated with fluctuation in BP and poor endurance.    Patient on target to meet rehab goals: no, goals for discharge downgraded. Currently needs min assist for bed mobility and CGA for sit - stand. Able to ambulate up to 20' using ARW . Needs mod assist for lower body care.  Progress limited by poor activity tolerance and soft BP despite use of abd binder, and compression hose.   *See Care Plan and progress notes for long and short-term goals.   Revisions to Treatment Plan:  MD adjusted midodrine  dosage Working on upright endurance tolerance and household level ambulation distance   Teaching Needs: Safety, medication, transfers, toileting, etc.   Current Barriers to Discharge: Decreased caregiver support  Possible Resolutions to Barriers: Family education OP follow up services DME: TTB and RW     Medical Summary Current Status: class 3 obesity, ESRD, dizziness, low blood pressure, type 2 diabetes, CVA  Barriers to Discharge: Medical stability  Barriers to Discharge Comments: class  3 obesity, ESRD, dizziness, low blood pressure, type 2 diabetes, CVA Possible Resolutions to [employer] Focus: continue compression garments/abdominal binder, increase midodrine  to 15mg  TID, continue HD, provided dietary education, continue to monitor CBGs, continue insulin , continue aspirin    Continued Need for Acute Rehabilitation Level of Care: The patient requires daily medical management by a physician with specialized training in physical medicine and rehabilitation for the following reasons: Direction of a multidisciplinary physical rehabilitation program to maximize  functional independence : Yes Medical management of patient stability for increased activity during participation in an intensive rehabilitation regime.: Yes Analysis of laboratory values and/or radiology reports with any subsequent need for medication adjustment and/or medical intervention. : Yes   I attest that I was present, lead the team conference, and concur with the assessment and plan of the team.   Fredericka Sober B 01/24/2024, 1:10 PM

## 2024-01-24 NOTE — Progress Notes (Signed)
 " [facility] Progress Note   Subjective:   Seen in room - feels ok today. Dialysis went ok yesterday - net UF 2.5L. Was supposed to get labs with HD yesterday - unclear why not done, will order now.  Objective Vitals:   01/23/24 1807 01/23/24 2011 01/24/24 0655 01/24/24 0801  BP: (!) 131/47 (!) 121/51 (!) 104/42 (!) 82/46  Pulse: (!) 58 60 62   Resp: 14 17 17    Temp: (!) 97.4 F (36.3 C) 97.6 F (36.4 C) 97.6 F (36.4 C)   TempSrc:      SpO2: 94% 96% (!) 86%   Weight: 135.4 kg     Height:       Physical Exam General: Well appearing, NAD. Room air Heart: RRR Lungs: CTAB Abdomen: abd binder in place Extremities: 1+ BLE edema Dialysis Access: TDC in R chest  Additional Objective Labs: Basic Metabolic Panel: Recent Labs  Lab 01/19/24 1152 01/21/24 0640  NA 131* 128*  K 5.1 4.9  CL 93* 92*  CO2 21* 22  GLUCOSE 158* 52*  BUN 73* 94*  CREATININE 6.41* 7.30*  CALCIUM  8.9 8.5*  PHOS 5.6* 6.4*   Liver Function Tests: Recent Labs  Lab 01/19/24 1152 01/21/24 0640  ALBUMIN  3.1* 3.1*   CBC: Recent Labs  Lab 01/19/24 1152 01/21/24 0640  WBC 12.8* 13.2*  NEUTROABS 9.7* 10.0*  HGB 8.0* 7.2*  HCT 25.3* 21.9*  MCV 96.9 94.4  PLT 450* 422*   Medications:   aspirin  EC  81 mg Oral Daily   atorvastatin   80 mg Oral Daily   Chlorhexidine  Gluconate Cloth  6 each Topical Q12H   clopidogrel   75 mg Oral Daily   collagenase    Topical Daily   [START ON 01/25/2024] darbepoetin (ARANESP ) injection - DIALYSIS  150 mcg Subcutaneous Q Thu-1800   Gerhardt's butt cream   Topical Daily   heparin   5,000 Units Subcutaneous Q8H   insulin  aspart  6 Units Subcutaneous TID WC   insulin  glargine  22 Units Subcutaneous BID   lidocaine   2 patch Transdermal Q24H   magic mouthwash  5 mL Oral QID   midodrine   15 mg Oral TID WC   multivitamin  1 tablet Oral QHS   pantoprazole   40 mg Oral Daily   senna-docusate  2 tablet Oral BID   sodium chloride  flush  10-40 mL  Intracatheter Q12H   sucroferric oxyhydroxide  500 mg Oral TID WC   zolpidem   5 mg Oral QHS    Dialysis Orders MWF - [facility] [place] 4h B350  133.5kg  TDC  Heparin  2000+ 1400u/hr Calcitriol 1.25 three times per week Mircera 90 mcg q 2 wks (last 12/1)  CXR 12/11-> resolved vasc congestion, improved IS edema   Assessment/Plan: ESRD - usual HD MWF. CRRT used from 12/10 - 12/13 for BP stability in the face of stroke-like symptoms. Stable off of CRRT. Next HD Friday per holiday schedule. BP/volume: Midodrine  has been ^ to 15mg  TID, tolerating. Still with some edema. Anemia of ESRD: Last Hgb 7.2 - on Aranesp  150mcg q Thursday (just raised). May benefit from transfusion. Tsat 10% - getting one dose IV iron  now. Secondary HPTH: Ca ok, last Phos high - restarted Velphoro  NSTEMI - 3V CAD by LHC -> s/p CABG 01/08/24 Stroke-like symptoms: was waxing/ waning per neuro. There was no acute CVA by imaging, but due to advanced carotid disease by CTA pt underwent L carotid artery stent per [facility] 12/10.  Nutrition: Alb low, on supplements.   **  Holiday Schedule for THIS week** Usual MWF schedule -> Sun (21), Tues (23), Friday (26) Usual TTS schedule -> Mon (22), Wed (24), Sat (27)   Izetta Boehringer, PA-C 01/24/2024, 8:46 AM  [facility]    "

## 2024-01-24 NOTE — Progress Notes (Addendum)
 Patient ID: Timothy Holloway, male   DOB: 1955-12-24, 68 y.o.   MRN: [id number]  Have reviewed team conference with pt and left a message for his wife, [name]. He is aware and agreeable with targeted d/c date of 01/02 and goals of Independent with assistive device.  Ordered rollator and TTB via [employer] - He will pay oop for TTB

## 2024-01-24 NOTE — Progress Notes (Signed)
 "                                                        PROGRESS NOTE   Subjective/Complaints: No new complaints this morning Feels dizziness is improved Discussed Christmas concert today if he would like to join  ROS: +orthostasis, +dizziness- improved   Objective:   No results found.  Recent Labs    01/24/24 0911  WBC 10.5  HGB 7.1*  HCT 22.1*  PLT 411*   No results for input(s): NA, K, CL, CO2, GLUCOSE, BUN, CREATININE, CALCIUM  in the last 72 hours.   Intake/Output Summary (Last 24 hours) at 01/24/2024 0940 Last data filed at 01/24/2024 9294 Gross per 24 hour  Intake 713 ml  Output 2500 ml  Net -1787 ml     Wound 01/18/24 1604 Pressure Injury Sacrum Unstageable - Full thickness tissue loss in which the base of the injury is covered by slough (yellow, tan, gray, green or brown) and/or eschar (tan, brown or black) in the wound bed. (Active)    Physical Exam: Vital Signs Blood pressure (!) 82/46, pulse 62, temperature 97.6 F (36.4 C), resp. rate 17, height 6' (1.829 m), weight 135.4 kg, SpO2 (!) 86%.  General: No acute distress Mood and affect are appropriate Heart: Bradycardia Lungs: Clear to auscultation, breathing unlabored, no rales or wheezes Abdomen: Positive bowel sounds, soft nontender to palpation, nondistended Extremities: No clubbing, cyanosis, or edema Skin: No evidence of breakdown, no evidence of rash  Neuro: Alert and oriented x3, decreased sensation in bilateral feet. 5/5 strength throughout- stable 12/24  Assessment/Plan: 1. Functional deficits which require 3+ hours per day of interdisciplinary therapy in a comprehensive inpatient rehab setting. Physiatrist is providing close team supervision and 24 hour management of active medical problems listed below. Physiatrist and rehab team continue to assess barriers to discharge/monitor patient progress toward functional and medical goals  Care Tool:  Bathing    Body parts  bathed by patient: Chest, Left arm, Right arm, Abdomen, Front perineal area, Right upper leg, Left upper leg, Face   Body parts bathed by helper: Right lower leg, Left lower leg, Buttocks     Bathing assist Assist Level: Moderate Assistance - Patient 50 - 74%     Upper Body Dressing/Undressing Upper body dressing   What is the patient wearing?: Pull over shirt    Upper body assist Assist Level: Supervision/Verbal cueing    Lower Body Dressing/Undressing Lower body dressing      What is the patient wearing?: Pants     Lower body assist Assist for lower body dressing: Contact Guard/Touching assist     Toileting Toileting    Toileting assist Assist for toileting: Moderate Assistance - Patient 50 - 74%     Transfers Chair/bed transfer  Transfers assist     Chair/bed transfer assist level: Minimal Assistance - Patient > 75%     Locomotion Ambulation   Ambulation assist      Assist level: Minimal Assistance - Patient > 75% Assistive device: No Device Max distance: 20   Walk 10 feet activity   Assist     Assist level: Minimal Assistance - Patient > 75% Assistive device: No Device   Walk 50 feet activity   Assist Walk 50 feet with 2 turns activity did not occur: Safety/medical concerns (fatigue,  SOB, dizziness, weakness/deconditioning)         Walk 150 feet activity   Assist Walk 150 feet activity did not occur: Safety/medical concerns (fatigue, SOB, dizziness, weakness/deconditioning)         Walk 10 feet on uneven surface  activity   Assist Walk 10 feet on uneven surfaces activity did not occur: Safety/medical concerns (fatigue, SOB, dizziness, weakness/deconditioning)         Wheelchair     Assist Is the patient using a wheelchair?: Yes Type of Wheelchair: Manual    Wheelchair assist level: Dependent - Patient 0% Max wheelchair distance: 137ft    Wheelchair 50 feet with 2 turns activity    Assist        Assist  Level: Dependent - Patient 0%   Wheelchair 150 feet activity     Assist      Assist Level: Dependent - Patient 0%   Blood pressure (!) 82/46, pulse 62, temperature 97.6 F (36.4 C), resp. rate 17, height 6' (1.829 m), weight 135.4 kg, SpO2 (!) 86%.  Medical Problem List and Plan: 1. 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  2. Pain: d/c oxycodone , continue tramadol  for moderate pain.              --Continue lidocaine  patch to chest wall for local measures  3. Chronic insomnia: continue ambien   4. Multiple incisions: paint with betadine bid.              --Gerhardt's and foam dressing to partial thickness sacral wound. Juven added to promote wound healing.   5. Significant Orthostatic hypotension Impacting therapy: TEDs/Binder as continues to have BP drop but asymptomatic per OT notes             --Sitting 113/33-->stand 130/42--> 98/33 after sitting 30 mins.              --also has Hx of vertigo.  -increase midodrine  to 15mg  TID, continue this dose, discussed that dizziness has improved  6. 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  7.  T2DM: Hgb A1c- 9.9. Monitor BS ac/hs, Add CM restrictions to diet. Used Lantus  50 units PTA  -decrease aspart to 6U with meals due to hypoglycemia. D/c ISS CBG (last 3)  Recent Labs    01/23/24 1841 01/23/24 2049 01/24/24 0611  GLUCAP 77 163* 121*   Will reduce lantus  to 22U, continue this dose  8. ESRD: Off CRRT and back on MWF HD schedule. Continue fluid restriction --schedule therapy at the end of the day to help with tolerance of therapy.   9.  Acute on chronic anemia: Hgb  reviewed and was 9 on 12/9. On Epo Recent values as below , no evidence of hematochezia some black stools will check stool guaic   10. Leucocytosis: Has had leucocytosis which continues to fluctuate             --12.2 at admission  Latest Ref Rng & Units 01/24/2024    9:11 AM 01/21/2024    6:40 AM 01/19/2024   11:52 AM  CBC  WBC 4.0 - 10.5 K/uL 10.5  13.2  12.8   Hemoglobin 13.0 - 17.0 g/dL 7.1  7.2  8.0   Hematocrit 39.0 - 52.0 % 22.1  21.9  25.3   Platelets 150 - 400 K/uL 411  422  450      11. Constipation: Change dulcolax to senna S 2 bid. LBM 12/22- messaged nursing to confirm accuracy, d/c suppository  12.  Low protein stores: Albumin  reviewed and is improving, d/c Juven  13. Odynophagia: continue MMW, messaged nursing to see if this is still an issue  14. Class 2 Obesity: BMI- 37.4 on 12/20 Educate on diet and exercise to promote overall health and mobility.   15. Atelectasis: continue flutter valve.     LOS: 6 days A FACE TO FACE EVALUATION WAS PERFORMED  Timothy Holloway Timothy Holloway 01/24/2024, 9:40 AM     "

## 2024-01-24 NOTE — Progress Notes (Signed)
 Occupational Therapy Note  Patient Details  Name: Timothy Holloway MRN: [id number] Date of Birth: 06-06-1955   Occupational Therapist participated in the interdisciplinary team conference, providing clinical information regarding the patient's current status, treatment goals, and weekly focus, including any barriers that need to be addressed. Please see the Inpatient Rehabilitation Team Conference and Plan of Care Update for further details.    Jennilee Demarco M 01/24/2024, 11:01 AM

## 2024-01-24 NOTE — Progress Notes (Signed)
 Occupational Therapy Session Note  Patient Details  Name: Timothy Holloway MRN: [id number] Date of Birth: 04-11-1955  Today's Date: 01/24/2024 OT Individual Time: 9266-9155 OT Individual Time Calculation (min): 71 min    Short Term Goals: Week 1:  OT Short Term Goal 1 (Week 1): Pt will complete LB with AE PRN with CGA OT Short Term Goal 2 (Week 1): Pt will complete toilet transfer with CGA using LRAD OT Short Term Goal 3 (Week 1): Pt will adhere to sternal precautions 90% of the time during functional ADL task with min cues  Skilled Therapeutic Interventions/Progress Updates:    Patient received seated at edge of bed.  Patient asymptomatic but BP as indicated below.  Applied thigh hight compression hise and ace wraps over, as well as abdominal binder.  Systolic pressure increased - 887/60.  Patient remains asymptomatic.  Transferred to chair and to sink to complete grooming.  Patient sat to complete.   Transported to ADL apartment to address bed mobility without hospital bed features.  Patient able to roll to sidelying and transition to sitting then very symptomatic - holds breathe through transition.  Sat x several minutes before transferring to standard chair.  Patient does not like tilt in space chair - feels like he is falling.  Will attempt standard chair for therapy to see if this is a safe option.   Patient returned to bed at end of session for 45 min rest before PT.    Therapy Documentation Precautions:  Precautions Precautions: Fall, Sternal Recall of Precautions/Restrictions: Impaired Precaution/Restrictions Comments: watch BP Restrictions Weight Bearing Restrictions Per Provider Order: No Other Position/Activity Restrictions: Sternal precautions   Vital Signs: Therapy Vitals Temp: 97.6 F (36.4 C) Pulse Rate: 62 Resp: 17 BP: (!) 82/46 (no binder or compression hose) Patient Position (if appropriate): Sitting Oxygen Therapy SpO2: (!) 86 % O2 Device: Room  Air Pain: Pain Assessment Pain Score: 4  Pain Type: Acute pain Pain Location: Chest Pain Orientation: Left Pain Descriptors / Indicators: Throbbing Pain Onset: On-going Patients Stated Pain Goal: 0 Pain Intervention(s): Repositioned    Therapy/Group: Individual Therapy  Anique Beckley M 01/24/2024, 8:45 AM

## 2024-01-25 ENCOUNTER — Inpatient Hospital Stay (HOSPITAL_COMMUNITY)

## 2024-01-25 LAB — CBC WITH DIFFERENTIAL/PLATELET
Abs Immature Granulocytes: 0.11 K/uL — ABNORMAL HIGH (ref 0.00–0.07)
Basophils Absolute: 0.1 K/uL (ref 0.0–0.1)
Basophils Relative: 1 %
Eosinophils Absolute: 0.9 K/uL — ABNORMAL HIGH (ref 0.0–0.5)
Eosinophils Relative: 8 %
HCT: 22.4 % — ABNORMAL LOW (ref 39.0–52.0)
Hemoglobin: 7.4 g/dL — ABNORMAL LOW (ref 13.0–17.0)
Immature Granulocytes: 1 %
Lymphocytes Relative: 10 %
Lymphs Abs: 1 K/uL (ref 0.7–4.0)
MCH: 30 pg (ref 26.0–34.0)
MCHC: 33 g/dL (ref 30.0–36.0)
MCV: 90.7 fL (ref 80.0–100.0)
Monocytes Absolute: 0.9 K/uL (ref 0.1–1.0)
Monocytes Relative: 9 %
Neutro Abs: 7.5 K/uL (ref 1.7–7.7)
Neutrophils Relative %: 71 %
Platelets: 407 K/uL — ABNORMAL HIGH (ref 150–400)
RBC: 2.47 MIL/uL — ABNORMAL LOW (ref 4.22–5.81)
RDW: 16.3 % — ABNORMAL HIGH (ref 11.5–15.5)
WBC: 10.5 K/uL (ref 4.0–10.5)
nRBC: 0 % (ref 0.0–0.2)

## 2024-01-25 LAB — BPAM RBC
Blood Product Expiration Date: 202601212359
ISSUE DATE / TIME: 202512241545
Unit Type and Rh: 5100

## 2024-01-25 LAB — GLUCOSE, CAPILLARY
Glucose-Capillary: 101 mg/dL — ABNORMAL HIGH (ref 70–99)
Glucose-Capillary: 109 mg/dL — ABNORMAL HIGH (ref 70–99)
Glucose-Capillary: 118 mg/dL — ABNORMAL HIGH (ref 70–99)
Glucose-Capillary: 137 mg/dL — ABNORMAL HIGH (ref 70–99)

## 2024-01-25 LAB — TYPE AND SCREEN
ABO/RH(D): O POS
Antibody Screen: NEGATIVE
Unit division: 0

## 2024-01-25 MED ORDER — HEPARIN SODIUM (PORCINE) 5000 UNIT/ML IJ SOLN
5000.0000 [IU] | Freq: Two times a day (BID) | INTRAMUSCULAR | Status: DC
  Administered 2024-01-25 – 2024-02-03 (×18): 5000 [IU] via SUBCUTANEOUS
  Filled 2024-01-25 (×9): qty 1

## 2024-01-25 NOTE — Progress Notes (Signed)
 "                                                        PROGRESS NOTE   Subjective/Complaints: Somnolent this morning Agreeable to d/c 1/2 Appreciate SW ordering equipment Hgb did not improve to degree expected  ROS: +orthostasis, +dizziness- improved   Objective:   No results found.  Recent Labs    01/24/24 0911 01/25/24 0854  WBC 10.5 10.5  HGB 7.1* 7.4*  HCT 22.1* 22.4*  PLT 411* 407*   Recent Labs    01/24/24 0911  NA 128*  K 4.0  CL 90*  CO2 27  GLUCOSE 121*  BUN 46*  CREATININE 4.58*  CALCIUM  8.4*     Intake/Output Summary (Last 24 hours) at 01/25/2024 0919 Last data filed at 01/25/2024 0700 Gross per 24 hour  Intake 868 ml  Output --  Net 868 ml     Wound 01/18/24 1604 Pressure Injury Sacrum Unstageable - Full thickness tissue loss in which the base of the injury is covered by slough (yellow, tan, gray, green or brown) and/or eschar (tan, brown or black) in the wound bed. (Active)    Physical Exam: Vital Signs Blood pressure (!) 143/49, pulse 60, temperature 97.7 F (36.5 C), resp. rate 17, height 6' (1.829 m), weight 135.4 kg, SpO2 96%.  General: No acute distress Mood and affect are appropriate Heart: Bradycardia Lungs: Clear to auscultation, breathing unlabored, no rales or wheezes Abdomen: Positive bowel sounds, soft nontender to palpation, nondistended Extremities: No clubbing, cyanosis, or edema Skin: No evidence of breakdown, no evidence of rash  Neuro: Alert and oriented x3, decreased sensation in bilateral feet. 5/5 strength throughout- stable 12/25  Assessment/Plan: 1. Functional deficits which require 3+ hours per day of interdisciplinary therapy in a comprehensive inpatient rehab setting. Physiatrist is providing close team supervision and 24 hour management of active medical problems listed below. Physiatrist and rehab team continue to assess barriers to discharge/monitor patient progress toward functional and medical  goals  Care Tool:  Bathing    Body parts bathed by patient: Chest, Left arm, Right arm, Abdomen, Front perineal area, Right upper leg, Left upper leg, Face   Body parts bathed by helper: Right lower leg, Left lower leg, Buttocks     Bathing assist Assist Level: Moderate Assistance - Patient 50 - 74%     Upper Body Dressing/Undressing Upper body dressing   What is the patient wearing?: Pull over shirt    Upper body assist Assist Level: Supervision/Verbal cueing    Lower Body Dressing/Undressing Lower body dressing      What is the patient wearing?: Pants     Lower body assist Assist for lower body dressing: Contact Guard/Touching assist     Toileting Toileting    Toileting assist Assist for toileting: Moderate Assistance - Patient 50 - 74%     Transfers Chair/bed transfer  Transfers assist     Chair/bed transfer assist level: Minimal Assistance - Patient > 75%     Locomotion Ambulation   Ambulation assist      Assist level: Contact Guard/Touching assist Assistive device: Rollator Max distance: 120   Walk 10 feet activity   Assist     Assist level: Contact Guard/Touching assist Assistive device: Rollator   Walk 50 feet activity   Assist Walk 50  feet with 2 turns activity did not occur: Safety/medical concerns (fatigue, SOB, dizziness, weakness/deconditioning)  Assist level: Contact Guard/Touching assist Assistive device: Rollator    Walk 150 feet activity   Assist Walk 150 feet activity did not occur: Safety/medical concerns (fatigue, SOB, dizziness, weakness/deconditioning)         Walk 10 feet on uneven surface  activity   Assist Walk 10 feet on uneven surfaces activity did not occur: Safety/medical concerns (fatigue, SOB, dizziness, weakness/deconditioning)         Wheelchair     Assist Is the patient using a wheelchair?: Yes Type of Wheelchair: Manual    Wheelchair assist level: Dependent - Patient 0% Max  wheelchair distance: 139ft    Wheelchair 50 feet with 2 turns activity    Assist        Assist Level: Dependent - Patient 0%   Wheelchair 150 feet activity     Assist      Assist Level: Dependent - Patient 0%   Blood pressure (!) 143/49, pulse 60, temperature 97.7 F (36.5 C), resp. rate 17, height 6' (1.829 m), weight 135.4 kg, SpO2 96%.  Medical Problem List and Plan: 1. 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  2. Pain: d/c oxycodone , continue tramadol  for moderate pain.              --Continue lidocaine  patch to chest wall for local measures  3. Chronic insomnia: continue ambien   4. Multiple incisions: paint with betadine bid.              --Gerhardt's and foam dressing to partial thickness sacral wound. Juven added to promote wound healing.   5. Significant Orthostatic hypotension Impacting therapy: TEDs/Binder as continues to have BP drop but asymptomatic per OT notes -increase midodrine  to 15mg  TID, continue this dose, discussed that dizziness has improved  6. 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  7.  T2DM: Hgb A1c- 9.9. Monitor BS ac/hs, Add CM restrictions to diet. Used Lantus  50 units PTA  -decrease aspart to 6U with meals due to hypoglycemia. D/c ISS CBG (last 3)  Recent Labs    01/24/24 1655 01/24/24 2104 01/25/24 0624  GLUCAP 124* 155* 101*   Will reduce lantus  to 22U, continue this dose  8. ESRD: Off CRRT and back on MWF HD schedule. Continue fluid restriction --schedule therapy at the end of the day to help with tolerance of therapy.   9.  Acute on chronic anemia: Hgb reviewed and was 9  on 12/9. On Epo Reviewed stool occult- one was positive and one was negative, Hgb did not improve as much as expected with 1U PRBCs, CT abdomen ordered, repeat CBC ordered for tomorrow. Heparin  decreased to q12H  10. Leucocytosis: Has had leucocytosis which continues to fluctuate             --12.2 at admission             Latest Ref Rng & Units 01/25/2024  8:54 AM 01/24/2024    9:11 AM 01/21/2024    6:40 AM  CBC  WBC 4.0 - 10.5 K/uL 10.5  10.5  13.2   Hemoglobin 13.0 - 17.0 g/dL 7.4  7.1  7.2   Hematocrit 39.0 - 52.0 % 22.4  22.1  21.9   Platelets 150 - 400 K/uL 407  411  422      11. Constipation: Change dulcolax to senna S 2 bid. LBM 12/22- messaged nursing to confirm accuracy, d/c suppository  12.  Low protein stores: Albumin  reviewed and is improving, d/c Juven  13. Odynophagia: continue MMW, messaged nursing to see if this is still an issue  14. Class 2 Obesity: BMI- 37.4 on 12/20 Educate on diet and exercise to promote overall health and mobility.   15. Atelectasis: continue flutter valve.     LOS: 7 days A FACE TO FACE EVALUATION WAS PERFORMED  Sven SQUIBB Raife Lizer 01/25/2024, 9:19 AM     "

## 2024-01-26 LAB — RENAL FUNCTION PANEL
Albumin: 3.2 g/dL — ABNORMAL LOW (ref 3.5–5.0)
Anion gap: 12 (ref 5–15)
BUN: 63 mg/dL — ABNORMAL HIGH (ref 8–23)
CO2: 24 mmol/L (ref 22–32)
Calcium: 8.6 mg/dL — ABNORMAL LOW (ref 8.9–10.3)
Chloride: 92 mmol/L — ABNORMAL LOW (ref 98–111)
Creatinine, Ser: 6.72 mg/dL — ABNORMAL HIGH (ref 0.61–1.24)
GFR, Estimated: 8 mL/min — ABNORMAL LOW
Glucose, Bld: 137 mg/dL — ABNORMAL HIGH (ref 70–99)
Phosphorus: 4.2 mg/dL (ref 2.5–4.6)
Potassium: 4.3 mmol/L (ref 3.5–5.1)
Sodium: 128 mmol/L — ABNORMAL LOW (ref 135–145)

## 2024-01-26 LAB — CBC WITH DIFFERENTIAL/PLATELET
Abs Immature Granulocytes: 0.14 K/uL — ABNORMAL HIGH (ref 0.00–0.07)
Basophils Absolute: 0.1 K/uL (ref 0.0–0.1)
Basophils Relative: 1 %
Eosinophils Absolute: 0.9 K/uL — ABNORMAL HIGH (ref 0.0–0.5)
Eosinophils Relative: 9 %
HCT: 25.7 % — ABNORMAL LOW (ref 39.0–52.0)
Hemoglobin: 8.2 g/dL — ABNORMAL LOW (ref 13.0–17.0)
Immature Granulocytes: 1 %
Lymphocytes Relative: 12 %
Lymphs Abs: 1.3 K/uL (ref 0.7–4.0)
MCH: 29.6 pg (ref 26.0–34.0)
MCHC: 31.9 g/dL (ref 30.0–36.0)
MCV: 92.8 fL (ref 80.0–100.0)
Monocytes Absolute: 1 K/uL (ref 0.1–1.0)
Monocytes Relative: 9 %
Neutro Abs: 7.4 K/uL (ref 1.7–7.7)
Neutrophils Relative %: 68 %
Platelets: 418 K/uL — ABNORMAL HIGH (ref 150–400)
RBC: 2.77 MIL/uL — ABNORMAL LOW (ref 4.22–5.81)
RDW: 16 % — ABNORMAL HIGH (ref 11.5–15.5)
WBC: 10.9 K/uL — ABNORMAL HIGH (ref 4.0–10.5)
nRBC: 0 % (ref 0.0–0.2)

## 2024-01-26 LAB — GLUCOSE, CAPILLARY
Glucose-Capillary: 120 mg/dL — ABNORMAL HIGH (ref 70–99)
Glucose-Capillary: 103 mg/dL — ABNORMAL HIGH (ref 70–99)
Glucose-Capillary: 144 mg/dL — ABNORMAL HIGH (ref 70–99)

## 2024-01-26 MED ORDER — INSULIN ASPART 100 UNIT/ML IJ SOLN
5.0000 [IU] | Freq: Three times a day (TID) | INTRAMUSCULAR | Status: DC
  Administered 2024-01-26 – 2024-02-02 (×15): 5 [IU] via SUBCUTANEOUS
  Filled 2024-01-26 (×10): qty 5

## 2024-01-26 MED ORDER — HEPARIN SODIUM (PORCINE) 1000 UNIT/ML IJ SOLN
INTRAMUSCULAR | Status: AC
  Filled 2024-01-26: qty 4

## 2024-01-26 MED ORDER — HEPARIN SODIUM (PORCINE) 1000 UNIT/ML IJ SOLN
INTRAMUSCULAR | Status: AC
  Filled 2024-01-26: qty 2

## 2024-01-26 MED ORDER — HEPARIN SODIUM (PORCINE) 1000 UNIT/ML DIALYSIS
2000.0000 [IU] | Freq: Once | INTRAMUSCULAR | Status: AC
  Administered 2024-01-26: 2000 [IU] via INTRAVENOUS_CENTRAL

## 2024-01-26 NOTE — Progress Notes (Signed)
 " [facility] Progress Note   Subjective:   Seen in room. Working with OT this am. No complaints. For dialysis today.   Objective Vitals:   01/25/24 0627 01/25/24 1249 01/25/24 2016 01/26/24 0327  BP: (!) 143/49 (!) 148/34 (!) 141/48 (!) 151/50  Pulse: 60 (!) 56 (!) 58 64  Resp: 17 16 18 18   Temp: 97.7 F (36.5 C) 97.9 F (36.6 C) 98.2 F (36.8 C) 98.1 F (36.7 C)  TempSrc:  Oral    SpO2: 96% 100% 99% 95%  Weight:    135.7 kg  Height:       Physical Exam General: Well appearing, NAD. Room air Heart: RRR Lungs: CTAB Abdomen: abd binder in place Extremities: 1+ BLE edema Dialysis Access: [facility] in R chest  Additional Objective Labs: Basic Metabolic Panel: Recent Labs  Lab 01/19/24 1152 01/21/24 0640 01/24/24 0911  NA 131* 128* 128*  K 5.1 4.9 4.0  CL 93* 92* 90*  CO2 21* 22 27  GLUCOSE 158* 52* 121*  BUN 73* 94* 46*  CREATININE 6.41* 7.30* 4.58*  CALCIUM  8.9 8.5* 8.4*  PHOS 5.6* 6.4* 4.1   Liver Function Tests: Recent Labs  Lab 01/19/24 1152 01/21/24 0640 01/24/24 0911  ALBUMIN  3.1* 3.1* 3.2*   CBC: Recent Labs  Lab 01/19/24 1152 01/21/24 0640 01/24/24 0911 01/25/24 0854 01/26/24 0609  WBC 12.8* 13.2* 10.5 10.5 10.9*  NEUTROABS 9.7* 10.0* 7.8* 7.5 7.4  HGB 8.0* 7.2* 7.1* 7.4* 8.2*  HCT 25.3* 21.9* 22.1* 22.4* 25.7*  MCV 96.9 94.4 94.0 90.7 92.8  PLT 450* 422* 411* 407* 418*   Medications:   aspirin  EC  81 mg Oral Daily   atorvastatin   80 mg Oral Daily   Chlorhexidine  Gluconate Cloth  6 each Topical Q12H   cholecalciferol   1,000 Units Oral Daily   clopidogrel   75 mg Oral Daily   collagenase    Topical Daily   darbepoetin (ARANESP ) injection - DIALYSIS  150 mcg Subcutaneous Q Thu-1800   Gerhardt's butt cream   Topical Daily   heparin   5,000 Units Subcutaneous Q12H   insulin  aspart  5 Units Subcutaneous TID WC   insulin  glargine  22 Units Subcutaneous BID   lidocaine   2 patch Transdermal Q24H   magic mouthwash  5 mL Oral QID    midodrine   15 mg Oral TID WC   multivitamin  1 tablet Oral QHS   pantoprazole   40 mg Oral Daily   senna-docusate  2 tablet Oral BID   sodium chloride  flush  10-40 mL Intracatheter Q12H   sucroferric oxyhydroxide  500 mg Oral TID WC   zolpidem   5 mg Oral QHS    Dialysis Orders MWF - [facility] 4h B350  133.5kg  TDC  Heparin  2000+ 1400u/hr Calcitriol 1.25 three times per week Mircera 90 mcg q 2 wks (last 12/1)  CXR 12/11-> resolved vasc congestion, improved IS edema   Assessment/Plan: ESRD - usual HD MWF. CRRT used from 12/10 - 12/13 for BP stability in the face of stroke-like symptoms. Stable off of CRRT. Next HD Friday per holiday schedule. BP/volume: Midodrine  has been ^ to 15mg  TID, tolerating. Still with some edema. Anemia of ESRD: Last Hgb 7.2 - on Aranesp  150mcg q Thursday (just raised). S/p 1 u prbcs 12/24.  Secondary HPTH: Ca ok, last Phos high - restarted Velphoro  NSTEMI - 3V CAD by LHC -> s/p CABG 01/08/24 Stroke-like symptoms: was waxing/ waning per neuro. There was no acute CVA by imaging, but due  to advanced carotid disease by CTA pt underwent L carotid artery stent per [facility] 12/10.  Nutrition: Alb low, on supplements.   Timothy Ronnald Acosta PA-C [facility] 01/26/2024,8:29 AM    "

## 2024-01-26 NOTE — Progress Notes (Signed)
 Occupational Therapy Weekly Progress Note  Patient Details  Name: Timothy Holloway MRN: [id number] Date of Birth: November 04, 1955  Beginning of progress report period: January 19, 2024 End of progress report period: January 26, 2024  Today's Date: 01/26/2024 OT Individual Time: 9196-9141 OT Individual Time Calculation (min): 55 min    Patient has met 3 of 3 short term goals.  Patient has shown improved functional mobility and tolerance to being upright.    Patient continues to demonstrate the following deficits: muscle weakness and decreased cardiorespiratoy endurance and therefore will continue to benefit from skilled OT intervention to enhance overall performance with BADL and Reduce care partner burden.  Patient progressing toward long term goals..  Continue plan of care.  OT Short Term Goals Week 1:  OT Short Term Goal 1 (Week 1): Pt will complete LB with AE PRN with CGA OT Short Term Goal 1 - Progress (Week 1): Met OT Short Term Goal 2 (Week 1): Pt will complete toilet transfer with CGA using LRAD OT Short Term Goal 2 - Progress (Week 1): Met OT Short Term Goal 3 (Week 1): Pt will adhere to sternal precautions 90% of the time during functional ADL task with min cues OT Short Term Goal 3 - Progress (Week 1): Met Week 2:  OT Short Term Goal 1 (Week 2): STG=LTG due to LOS  Skilled Therapeutic Interventions/Progress Updates:   Patient received seated at edge of bed, indicating need to use the bathroom.  Walked with rollator to bathroom for continent void.  Patient able to complete clothing management and hygiene with stand by assist and cueing.  Patient walked to sink to bathe self and put on fresh clothing.   Ted hose applied.  Explained to patient that per MD's his BP is chronically low, will monitor BP as patient symptomatic.   Patient with significant pitting edema in BLE (Has HD later today)  Applied compression hose and patient returned to bed to elevate feet until PT session.     Therapy Documentation Precautions:  Precautions Precautions: Fall, Sternal Recall of Precautions/Restrictions: Impaired Precaution/Restrictions Comments: watch BP Restrictions Weight Bearing Restrictions Per Provider Order: No Other Position/Activity Restrictions: Sternal precautions   Pain:  Denies pain    Therapy/Group: Individual Therapy  Fin Hupp M 01/26/2024, 8:59 AM

## 2024-01-26 NOTE — Progress Notes (Signed)
 Physical Therapy Session Note  Patient Details  Name: Timothy Holloway MRN: [id number] Date of Birth: 1955/03/09  Today's Date: 01/26/2024 PT Individual Time: 0915-1025  PT Individual Time Calculation (min): 70 min  Short Term Goals: Week 1:  PT Short Term Goal 1 (Week 1): pt will perform bed mobility with supervision using bed features PT Short Term Goal 2 (Week 1): pt will perform all transfers with LRAD and CGA PT Short Term Goal 3 (Week 1): pt will ambulate 71ft with LRAD and CGA  Skilled Therapeutic Interventions/Progress Updates:  Chart reviewed and pt agreeable to therapy. Pt received seated on toilet and with nurse present for safety with 0/10 c/o pain. Also of note, pt with TED hose but no ace wrap or ab binder and no reported dizziness. Session focused on functional transfers, balance, ambulation, and activity tolerance to promote safe home mobility and access. Pt initiated session with amb of 22ft using close S + rollator with no c/o dizziness. Pt did report high fatigue and required sated rest break with pursed-lip breathing. Pt demonstrated good technique for pursed-lipped breathing. Pt then completed amb of 164ft to therapy gym using close S + rollator. In gym pt competed NuStep for 8 mins at workloads 2-5 using BLE only 2/2 precautions. Pt RPE 10-15 t/o NuStep activity. Pt required long seated rest break after NuStep, during which PT educated on safe monitoring of activity tolerance to reduce fall risk. Pt then amb to room at same assist level. In room, pt completed standing marching with high knees and heel raises for 3x10. Pt also completed spirometry exercise for 2x10 at goal. Session education emphasized fall prevention in setting of low activity tolerance. At end of session, pt was left semi-reclined in bed with alarm engaged, nurse call bell and all needs in reach.     Therapy Documentation Precautions:  Precautions Precautions: Fall, Sternal Recall of  Precautions/Restrictions: Impaired Precaution/Restrictions Comments: watch BP Restrictions Weight Bearing Restrictions Per Provider Order: No Other Position/Activity Restrictions: Sternal precautions General:      Therapy/Group: Individual Therapy   Warrick KANDICE Raspberry 01/26/2024, 10:26 AM

## 2024-01-26 NOTE — Progress Notes (Signed)
 "                                                        PROGRESS NOTE   Subjective/Complaints: Having a great day today Dizziness has improved Discussed that Hgb has improved and anemia may have been contributing to dizziness  ROS: +orthostasis, +dizziness- improved, fatigue improved   Objective:   CT ABDOMEN PELVIS WO CONTRAST Result Date: 01/25/2024 CLINICAL DATA:  Anemia, occult blood in stool. EXAM: CT ABDOMEN AND PELVIS WITHOUT CONTRAST TECHNIQUE: Multidetector CT imaging of the abdomen and pelvis was performed following the standard protocol without IV contrast. RADIATION DOSE REDUCTION: This exam was performed according to the departmental dose-optimization program which includes automated exposure control, adjustment of the mA and/or kV according to patient size and/or use of iterative reconstruction technique. COMPARISON:  None Available. FINDINGS: Lower chest: Small right and moderate left pleural effusions. Compressive atelectasis in both lower lobes with mild volume loss in the lingula. Heart is enlarged. Atherosclerotic calcification of the aorta, aortic valve and coronary arteries. No pericardial effusion. Gynecomastia. Distal esophagus is grossly unremarkable. Hepatobiliary: Liver and gallbladder are unremarkable. No biliary ductal dilatation. Pancreas: Negative. Spleen: Negative. Adrenals/Urinary Tract: Adrenal glands and kidneys are grossly unremarkable. Renal vascular calcifications bilaterally. Ureters are decompressed. Bladder is grossly unremarkable. Stomach/Bowel: Stomach, small bowel and colon are unremarkable. Appendix is not readily visualized. Vascular/Lymphatic: Atherosclerotic calcification of the aorta. No pathologically enlarged lymph nodes. Reproductive: Prostate is visualized. Other: Small bilateral inguinal hernias contain fat. Small umbilical hernia contains fat. No free fluid. Mesenteries and peritoneum are unremarkable. Musculoskeletal: Osteopenia. Degenerative  changes in the spine. Sarcopenia. L4-5 spinal process fixation. IMPRESSION: 1. No acute findings to explain the patient's anemia. 2. Small right and moderate left pleural effusions with compressive atelectasis in both lower lobes. 3. Aortic atherosclerosis (ICD10-I70.0). Coronary artery calcification. Electronically Signed   By: Newell Eke M.D.   On: 01/25/2024 14:36    Recent Labs    01/25/24 0854 01/26/24 0609  WBC 10.5 10.9*  HGB 7.4* 8.2*  HCT 22.4* 25.7*  PLT 407* 418*   Recent Labs    01/24/24 0911  NA 128*  K 4.0  CL 90*  CO2 27  GLUCOSE 121*  BUN 46*  CREATININE 4.58*  CALCIUM  8.4*     Intake/Output Summary (Last 24 hours) at 01/26/2024 0857 Last data filed at 01/26/2024 0800 Gross per 24 hour  Intake 720 ml  Output --  Net 720 ml     Wound 01/18/24 1604 Pressure Injury Sacrum Unstageable - Full thickness tissue loss in which the base of the injury is covered by slough (yellow, tan, gray, green or brown) and/or eschar (tan, brown or black) in the wound bed. (Active)    Physical Exam: Vital Signs Blood pressure (!) 151/50, pulse 64, temperature 98.1 F (36.7 C), resp. rate 18, height 6' (1.829 m), weight 135.7 kg, SpO2 95%.  General: No acute distress Mood and affect are appropriate Heart: Bradycardia Lungs: Clear to auscultation, breathing unlabored, no rales or wheezes Abdomen: Positive bowel sounds, soft nontender to palpation, nondistended Extremities:  Skin: sternal incision c/d/i  Neuro: Alert and oriented x3, decreased sensation in bilateral feet. 5/5 strength throughout- stable 12/26  Assessment/Plan: 1. Functional deficits which require 3+ hours per day of interdisciplinary therapy in a comprehensive inpatient rehab setting.  Physiatrist is providing close team supervision and 24 hour management of active medical problems listed below. Physiatrist and rehab team continue to assess barriers to discharge/monitor patient progress toward  functional and medical goals  Care Tool:  Bathing    Body parts bathed by patient: Chest, Left arm, Right arm, Abdomen, Front perineal area, Right upper leg, Left upper leg, Face   Body parts bathed by helper: Right lower leg, Left lower leg, Buttocks     Bathing assist Assist Level: Moderate Assistance - Patient 50 - 74%     Upper Body Dressing/Undressing Upper body dressing   What is the patient wearing?: Pull over shirt    Upper body assist Assist Level: Supervision/Verbal cueing    Lower Body Dressing/Undressing Lower body dressing      What is the patient wearing?: Pants     Lower body assist Assist for lower body dressing: Contact Guard/Touching assist     Toileting Toileting    Toileting assist Assist for toileting: Moderate Assistance - Patient 50 - 74%     Transfers Chair/bed transfer  Transfers assist     Chair/bed transfer assist level: Minimal Assistance - Patient > 75%     Locomotion Ambulation   Ambulation assist      Assist level: Contact Guard/Touching assist Assistive device: Rollator Max distance: 120   Walk 10 feet activity   Assist     Assist level: Contact Guard/Touching assist Assistive device: Rollator   Walk 50 feet activity   Assist Walk 50 feet with 2 turns activity did not occur: Safety/medical concerns (fatigue, SOB, dizziness, weakness/deconditioning)  Assist level: Contact Guard/Touching assist Assistive device: Rollator    Walk 150 feet activity   Assist Walk 150 feet activity did not occur: Safety/medical concerns (fatigue, SOB, dizziness, weakness/deconditioning)         Walk 10 feet on uneven surface  activity   Assist Walk 10 feet on uneven surfaces activity did not occur: Safety/medical concerns (fatigue, SOB, dizziness, weakness/deconditioning)         Wheelchair     Assist Is the patient using a wheelchair?: Yes Type of Wheelchair: Manual    Wheelchair assist level: Dependent -  Patient 0% Max wheelchair distance: 125ft    Wheelchair 50 feet with 2 turns activity    Assist        Assist Level: Dependent - Patient 0%   Wheelchair 150 feet activity     Assist      Assist Level: Dependent - Patient 0%   Blood pressure (!) 151/50, pulse 64, temperature 98.1 F (36.7 C), resp. rate 18, height 6' (1.829 m), weight 135.7 kg, SpO2 95%.  Medical Problem List and Plan: 1. 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  2. Pain: d/c oxycodone , continue tramadol  for moderate pain.              --Continue lidocaine  patch to chest wall for local measures  3. Chronic insomnia: continue ambien   4. Multiple incisions: paint with betadine bid.              --  Gerhardt's and foam dressing to partial thickness sacral wound. Juven added to promote wound healing.   5. Significant Orthostatic hypotension Impacting therapy: TEDs/Binder as continues to have BP drop but asymptomatic per OT notes -increase midodrine  to 15mg  TID, continue this dose, discussed that dizziness has improved  6. 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   -sternal incision assessed and is healing well  7.  T2DM: Hgb A1c- 9.9. Monitor BS ac/hs, Add CM restrictions to diet. Used Lantus  50 units PTA  -decrease aspart to 6U with meals due to hypoglycemia. D/c ISS CBG (last 3)  Recent Labs    01/25/24 1719 01/25/24 2023 01/26/24 0606  GLUCAP 118* 137* 120*   Will reduce lantus  to 22U, continue this dose  8. ESRD: Off CRRT and back on MWF HD schedule. Continue fluid restriction --schedule therapy at the end of the day to help with tolerance  of therapy.   9.  Acute on chronic anemia: Hgb reviewed and was 9 on 12/9. On Epo Reviewed stool occult- one was positive and one was negative, Hgb did not improve as much as expected with 1U PRBCs on 12/25- CT abdomen ordered and was negative for acute bleed. Hgb reviewed 12/26 and improved. Heparin  decreased to q12H  10. Leucocytosis: Has had leucocytosis which continues to fluctuate             --12.2 at admission             Latest Ref Rng & Units 01/26/2024    6:09 AM 01/25/2024    8:54 AM 01/24/2024    9:11 AM  CBC  WBC 4.0 - 10.5 K/uL 10.9  10.5  10.5   Hemoglobin 13.0 - 17.0 g/dL 8.2  7.4  7.1   Hematocrit 39.0 - 52.0 % 25.7  22.4  22.1   Platelets 150 - 400 K/uL 418  407  411      11. Constipation: Change dulcolax to senna S 2 bid. LBM 12/25- d/c suppository  12.  Low protein stores: Albumin  reviewed and is improving, d/c Juven  13. Odynophagia: continue MMW, messaged nursing to see if this is still an issue  14. Class 2 Obesity: BMI- 37.4 on 12/20 Educate on diet and exercise to promote overall health and mobility.   15. Atelectasis: continue flutter valve.  16. Leukocytosis: repeat CBC tomorrow     LOS: 8 days A FACE TO FACE EVALUATION WAS PERFORMED  Sven SQUIBB Hafsa Lohn 01/26/2024, 8:57 AM     "

## 2024-01-27 LAB — CBC WITH DIFFERENTIAL/PLATELET
Abs Immature Granulocytes: 0.13 K/uL — ABNORMAL HIGH (ref 0.00–0.07)
Basophils Absolute: 0 K/uL (ref 0.0–0.1)
Basophils Relative: 0 %
Eosinophils Absolute: 0.7 K/uL — ABNORMAL HIGH (ref 0.0–0.5)
Eosinophils Relative: 8 %
HCT: 24.4 % — ABNORMAL LOW (ref 39.0–52.0)
Hemoglobin: 7.8 g/dL — ABNORMAL LOW (ref 13.0–17.0)
Immature Granulocytes: 1 %
Lymphocytes Relative: 12 %
Lymphs Abs: 1.1 K/uL (ref 0.7–4.0)
MCH: 29.7 pg (ref 26.0–34.0)
MCHC: 32 g/dL (ref 30.0–36.0)
MCV: 92.8 fL (ref 80.0–100.0)
Monocytes Absolute: 1 K/uL (ref 0.1–1.0)
Monocytes Relative: 11 %
Neutro Abs: 6.2 K/uL (ref 1.7–7.7)
Neutrophils Relative %: 68 %
Platelets: 363 K/uL (ref 150–400)
RBC: 2.63 MIL/uL — ABNORMAL LOW (ref 4.22–5.81)
RDW: 15.9 % — ABNORMAL HIGH (ref 11.5–15.5)
WBC: 9.2 K/uL (ref 4.0–10.5)
nRBC: 0 % (ref 0.0–0.2)

## 2024-01-27 LAB — GLUCOSE, CAPILLARY
Glucose-Capillary: 68 mg/dL — ABNORMAL LOW (ref 70–99)
Glucose-Capillary: 130 mg/dL — ABNORMAL HIGH (ref 70–99)
Glucose-Capillary: 110 mg/dL — ABNORMAL HIGH (ref 70–99)
Glucose-Capillary: 184 mg/dL — ABNORMAL HIGH (ref 70–99)

## 2024-01-27 MED ORDER — INSULIN GLARGINE 100 UNIT/ML ~~LOC~~ SOLN
20.0000 [IU] | Freq: Two times a day (BID) | SUBCUTANEOUS | Status: DC
  Administered 2024-01-27 – 2024-01-30 (×6): 20 [IU] via SUBCUTANEOUS
  Filled 2024-01-27 (×6): qty 0.2

## 2024-01-27 NOTE — Progress Notes (Signed)
 "                                                        PROGRESS NOTE   Subjective/Complaints: Having a great day today  Pt reports feels fine, but still supine in bed- just woke up LBM yesterday.    ROS: +orthostasis, +dizziness- improved, fatigue improved  Pt denies SOB, abd pain, CP, N/V/C/D, and vision changes   Objective:   CT ABDOMEN PELVIS WO CONTRAST Result Date: 01/25/2024 CLINICAL DATA:  Anemia, occult blood in stool. EXAM: CT ABDOMEN AND PELVIS WITHOUT CONTRAST TECHNIQUE: Multidetector CT imaging of the abdomen and pelvis was performed following the standard protocol without IV contrast. RADIATION DOSE REDUCTION: This exam was performed according to the departmental dose-optimization program which includes automated exposure control, adjustment of the mA and/or kV according to patient size and/or use of iterative reconstruction technique. COMPARISON:  None Available. FINDINGS: Lower chest: Small right and moderate left pleural effusions. Compressive atelectasis in both lower lobes with mild volume loss in the lingula. Heart is enlarged. Atherosclerotic calcification of the aorta, aortic valve and coronary arteries. No pericardial effusion. Gynecomastia. Distal esophagus is grossly unremarkable. Hepatobiliary: Liver and gallbladder are unremarkable. No biliary ductal dilatation. Pancreas: Negative. Spleen: Negative. Adrenals/Urinary Tract: Adrenal glands and kidneys are grossly unremarkable. Renal vascular calcifications bilaterally. Ureters are decompressed. Bladder is grossly unremarkable. Stomach/Bowel: Stomach, small bowel and colon are unremarkable. Appendix is not readily visualized. Vascular/Lymphatic: Atherosclerotic calcification of the aorta. No pathologically enlarged lymph nodes. Reproductive: Prostate is visualized. Other: Small bilateral inguinal hernias contain fat. Small umbilical hernia contains fat. No free fluid. Mesenteries and peritoneum are unremarkable.  Musculoskeletal: Osteopenia. Degenerative changes in the spine. Sarcopenia. L4-5 spinal process fixation. IMPRESSION: 1. No acute findings to explain the patient's anemia. 2. Small right and moderate left pleural effusions with compressive atelectasis in both lower lobes. 3. Aortic atherosclerosis (ICD10-I70.0). Coronary artery calcification. Electronically Signed   By: Newell Eke M.D.   On: 01/25/2024 14:36    Recent Labs    01/26/24 0609 01/27/24 0626  WBC 10.9* 9.2  HGB 8.2* 7.8*  HCT 25.7* 24.4*  PLT 418* 363   Recent Labs    01/26/24 1412  NA 128*  K 4.3  CL 92*  CO2 24  GLUCOSE 137*  BUN 63*  CREATININE 6.72*  CALCIUM  8.6*     Intake/Output Summary (Last 24 hours) at 01/27/2024 1222 Last data filed at 01/27/2024 0800 Gross per 24 hour  Intake 360 ml  Output 3000 ml  Net -2640 ml     Wound 01/18/24 1604 Pressure Injury Sacrum Unstageable - Full thickness tissue loss in which the base of the injury is covered by slough (yellow, tan, gray, green or brown) and/or eschar (tan, brown or black) in the wound bed. (Active)    Physical Exam: Vital Signs Blood pressure (!) 131/54, pulse 61, temperature 98.2 F (36.8 C), resp. rate 17, height 6' (1.829 m), weight 135.4 kg, SpO2 98%.   General: awake, alert, appropriate, supine in bed; appears comfortable; NAD HENT: conjugate gaze; oropharynx moist CV: regular to borderline bradycardic rate; no JVD Pulmonary: CTA B/L; no W/R/R- good air movement- except decreased at bases B/L- worse on L GI: soft, NT, ND, (+)BS Psychiatric: appropriate but flat Neurological: alert Skin- sternal incision  Neuro: Alert and oriented  x3, decreased sensation in bilateral feet. 5/5 strength throughout- stable 12/26  Assessment/Plan: 1. Functional deficits which require 3+ hours per day of interdisciplinary therapy in a comprehensive inpatient rehab setting. Physiatrist is providing close team supervision and 24 hour management of  active medical problems listed below. Physiatrist and rehab team continue to assess barriers to discharge/monitor patient progress toward functional and medical goals  Care Tool:  Bathing    Body parts bathed by patient: Chest, Left arm, Right arm, Abdomen, Front perineal area, Right upper leg, Left upper leg, Face   Body parts bathed by helper: Right lower leg, Left lower leg, Buttocks     Bathing assist Assist Level: Moderate Assistance - Patient 50 - 74%     Upper Body Dressing/Undressing Upper body dressing   What is the patient wearing?: Pull over shirt    Upper body assist Assist Level: Supervision/Verbal cueing    Lower Body Dressing/Undressing Lower body dressing      What is the patient wearing?: Pants     Lower body assist Assist for lower body dressing: Contact Guard/Touching assist     Toileting Toileting    Toileting assist Assist for toileting: Moderate Assistance - Patient 50 - 74%     Transfers Chair/bed transfer  Transfers assist     Chair/bed transfer assist level: Minimal Assistance - Patient > 75%     Locomotion Ambulation   Ambulation assist      Assist level: Contact Guard/Touching assist Assistive device: Rollator Max distance: 166   Walk 10 feet activity   Assist     Assist level: Contact Guard/Touching assist Assistive device: Rollator   Walk 50 feet activity   Assist Walk 50 feet with 2 turns activity did not occur: Safety/medical concerns (fatigue, SOB, dizziness, weakness/deconditioning)  Assist level: Contact Guard/Touching assist Assistive device: Rollator    Walk 150 feet activity   Assist Walk 150 feet activity did not occur: Safety/medical concerns (fatigue, SOB, dizziness, weakness/deconditioning)  Assist level: Contact Guard/Touching assist Assistive device: Rollator    Walk 10 feet on uneven surface  activity   Assist Walk 10 feet on uneven surfaces activity did not occur: Safety/medical  concerns (fatigue, SOB, dizziness, weakness/deconditioning)         Wheelchair     Assist Is the patient using a wheelchair?: Yes Type of Wheelchair: Manual    Wheelchair assist level: Dependent - Patient 0% Max wheelchair distance: 175ft    Wheelchair 50 feet with 2 turns activity    Assist        Assist Level: Dependent - Patient 0%   Wheelchair 150 feet activity     Assist      Assist Level: Dependent - Patient 0%   Blood pressure (!) 131/54, pulse 61, temperature 98.2 F (36.8 C), resp. rate 17, height 6' (1.829 m), weight 135.4 kg, SpO2 98%.  Medical Problem List and Plan: 1. 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  2. Pain: d/c oxycodone , continue tramadol  for moderate pain.              --  Continue lidocaine  patch to chest wall for local measures  12/27- denies pain this AM 3. Chronic insomnia: continue ambien   4. Multiple incisions: paint with betadine bid.              --Gerhardt's and foam dressing to partial thickness sacral wound. Juven added to promote wound healing.   5. Significant Orthostatic hypotension Impacting therapy: TEDs/Binder as continues to have BP drop but asymptomatic per OT notes -increase midodrine  to 15mg  TID, continue this dose, discussed that dizziness has improved  6. 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   -sternal incision assessed and is healing well  7.  T2DM: Hgb A1c- 9.9. Monitor BS ac/hs, Add CM restrictions to diet. Used Lantus  50 units PTA  -decrease aspart to 6U with meals due to hypoglycemia. D/c ISS CBG (last 3)  Recent Labs     01/26/24 2012 01/27/24 0605 01/27/24 1113  GLUCAP 144* 68* 130*   Will reduce lantus  to 22U, continue this dose  12/27- will reduce Lantus  to 20 units since dropped to 68 this AM- also attempted to order night snack, although required a comment to get it in the note 8. ESRD: Off CRRT and back on MWF HD schedule. Continue fluid restriction --schedule therapy at the end of the day to help with tolerance of therapy.   9.  Acute on chronic anemia: Hgb reviewed and was 9 on 12/9. On Epo Reviewed stool occult- one was positive and one was negative, Hgb did not improve as much as expected with 1U PRBCs on 12/25- CT abdomen ordered and was negative for acute bleed. Hgb reviewed 12/26 and improved. Heparin  decreased to q12H  12/27- Hb 7.8- about his normal last 7 days or so- running 7.0-8.0 10. Leucocytosis: Has had leucocytosis which continues to fluctuate             --12.2 at admission    12/27- WBC 9.2 today          Latest Ref Rng & Units 01/27/2024    6:26 AM 01/26/2024    6:09 AM 01/25/2024    8:54 AM  CBC  WBC 4.0 - 10.5 K/uL 9.2  10.9  10.5   Hemoglobin 13.0 - 17.0 g/dL 7.8  8.2  7.4   Hematocrit 39.0 - 52.0 % 24.4  25.7  22.4   Platelets 150 - 400 K/uL 363  418  407      11. Constipation: Change dulcolax to senna S 2 bid. LBM 12/25- d/c suppository  12/27- LBM yesterday- type 6 12.  Low protein stores: Albumin  reviewed and is improving, d/c Juven  13. Odynophagia: continue MMW, messaged nursing to see if this is still an issue  14. Class 2 Obesity: BMI- 37.4 on 12/20 Educate on diet and exercise to promote overall health and mobility.   12/28- BMI up to 40.48 today- large jump considering his fluid status/ESRD- will d/w renal  15. Atelectasis: continue flutter valve.  16. Hyponatremia  12/28- Na 128- running 128- 136 in last 1 week- will d/w renal   I spent a total of  52  minutes on total care today- >50% coordination of care- due to  D/w renal, and nursing-  review of labs today and last 7 days- and vitals, weights and B/B.    LOS: 9 days A FACE TO FACE EVALUATION WAS PERFORMED  Constantina Laseter 01/27/2024, 12:22 PM     "

## 2024-01-27 NOTE — Progress Notes (Signed)
 Occupational Therapy Session Note  Patient Details  Name: Timothy Holloway MRN: [id number] Date of Birth: 1955/04/10  Today's Date: 01/27/2024 OT Individual Time: 1115-1155 OT Individual Time Calculation (min): 40 min    Short Term Goals: Week 2:  OT Short Term Goal 1 (Week 2): STG=LTG due to LOS  Skilled Therapeutic Interventions/Progress Updates:   Patient received seated in wheelchair, wife at bedside.  Patient reporting that he walked short distance in gym today during PT without his walker.  Patient excited about this.   Wife asking about shower equipment.  Used this opportunity to train wife on shower transfer with tub transfer bench.  Wife thrilled to see that patient able to transfer into and out of shower without assistance safely.  Demonstrated suction grab bar as well, and wife plans to order.  Patient able to transfer to/from tub bench without rollator and with close supervision.  Patient has very small bathroom at home so would likely not bring in rollator.   Answered all wife's questions, and patient left on edge of bed for lunch.    Therapy Documentation Precautions:  Precautions Precautions: Fall, Sternal Recall of Precautions/Restrictions: Impaired Precaution/Restrictions Comments: watch BP Restrictions Weight Bearing Restrictions Per Provider Order: No Other Position/Activity Restrictions: Sternal precautions  Pain: No report of pain     Therapy/Group: Individual Therapy  Lillie Bollig M 01/27/2024, 12:03 PM

## 2024-01-27 NOTE — Plan of Care (Signed)
" °  Problem: Education: Goal: Ability to describe self-care measures that may prevent or decrease complications (Diabetes Survival Skills Education) will improve Outcome: Progressing Goal: Individualized Educational Video(s) Outcome: Progressing   Problem: Coping: Goal: Ability to adjust to condition or change in health will improve Outcome: Progressing   Problem: Fluid Volume: Goal: Ability to maintain a balanced intake and output will improve Outcome: Progressing   Problem: Health Behavior/Discharge Planning: Goal: Ability to identify and utilize available resources and services will improve Outcome: Progressing Goal: Ability to manage health-related needs will improve Outcome: Progressing   Problem: Metabolic: Goal: Ability to maintain appropriate glucose levels will improve Outcome: Progressing   Problem: Nutritional: Goal: Maintenance of adequate nutrition will improve Outcome: Progressing Goal: Progress toward achieving an optimal weight will improve Outcome: Progressing   Problem: Skin Integrity: Goal: Risk for impaired skin integrity will decrease Outcome: Progressing   Problem: Tissue Perfusion: Goal: Adequacy of tissue perfusion will improve Outcome: Progressing   Problem: Consults Goal: RH STROKE PATIENT EDUCATION Description: See Patient Education module for education specifics  Outcome: Progressing Goal: Nutrition Consult-if indicated Outcome: Progressing Goal: Diabetes Guidelines if Diabetic/Glucose > 140 Description: If diabetic or lab glucose is > 140 mg/dl - Initiate Diabetes/Hyperglycemia Guidelines & Document Interventions  Outcome: Progressing   Problem: RH BOWEL ELIMINATION Goal: RH STG MANAGE BOWEL WITH ASSISTANCE Description: STG Manage Bowel with Assistance. Outcome: Progressing Goal: RH STG MANAGE BOWEL W/MEDICATION W/ASSISTANCE Description: STG Manage Bowel with Medication with Assistance. Outcome: Progressing   Problem: RH SAFETY Goal:  RH STG ADHERE TO SAFETY PRECAUTIONS W/ASSISTANCE/DEVICE Description: STG Adhere to Safety Precautions With Assistance/Device. Outcome: Progressing   Problem: RH PAIN MANAGEMENT Goal: RH STG PAIN MANAGED AT OR BELOW PT'S PAIN GOAL Outcome: Progressing   Problem: RH KNOWLEDGE DEFICIT Goal: RH STG INCREASE KNOWLEDGE OF DIABETES Outcome: Progressing Goal: RH STG INCREASE KNOWLEDGE OF HYPERTENSION Outcome: Progressing Goal: RH STG INCREASE KNOWLEGDE OF HYPERLIPIDEMIA Outcome: Progressing Goal: RH STG INCREASE KNOWLEDGE OF STROKE PROPHYLAXIS Outcome: Progressing   "

## 2024-01-27 NOTE — Progress Notes (Signed)
 Physical Therapy Weekly Progress Note  Patient Details  Name: Timothy Holloway MRN: [id number] Date of Birth: [birth year]  Beginning of progress report period: January 19, 2024 End of progress report period: January 27, 2024  Today's Date: 01/27/2024 PT Individual Time: 0800-0900 PT Individual Time Calculation (min): 60 min   Patient has met 3 of 3 short term goals.  Pt has met all STG's and is progressing towards LTG.  Pt was limited by dizziness but recently able to perform all mobility w/o c/o dizziness and knee high TEDS only.  Patient continues to demonstrate the following deficits decreased cardiorespiratoy endurance and decreased coordination and therefore will continue to benefit from skilled PT intervention to increase functional independence with mobility.  Patient progressing toward long term goals..  Continue plan of care.  PT Short Term Goals Week 2:  PT Short Term Goal 1 (Week 2): STG= LTG  Skilled Therapeutic Interventions/Progress Updates: Pt presents sitting EOB and agreeable to therapy.  Pt transfers sit to supine w/ CGA to donning TEDS w/ total A.  BP monitored throughout session as noted, w/o c/o dizziness at any time:  Sitting 139/39, HR 61 After amb 127/72, HR 60 2nd gait 151/68, HR 66  Pt amb multiple trials w/ rollator and CGA/ close supervision up to 166'.  Pt performed sit to stands w/o UE support x 5 from 22 height and supervision, but x 5 from 21 height w/ close supervision/CGA and cues for scooting forward and breathing technique.  Pt requires seated rest breaks for c/o 6/10 fatigue.  Pt negotiated rollator through cone obstacle course x 2.  Pt amb forward and backwards x 8' w/o AD and cues for maintaining BOS especially w/ backwards.  Pt amb back to room and returned to supine w/ log roll and close sup.  Bed alarm on and all needs in reach.     Therapy Documentation Precautions:  Precautions Precautions: Fall, Sternal Recall of  Precautions/Restrictions: Impaired Precaution/Restrictions Comments: watch BP Restrictions Weight Bearing Restrictions Per Provider Order: No Other Position/Activity Restrictions: Sternal precautions General:   Vital Signs:   Pain:0/10 Pain Assessment Pain Scale: 0-10 Pain Score: 8  Pain Location: Chest Pain Intervention(s): Medication (See eMAR)    Therapy/Group: Individual Therapy  Anton Cheramie P Alaija Ruble 01/27/2024, 10:08 AM

## 2024-01-27 NOTE — Progress Notes (Signed)
 Occupational Therapy Session Note  Patient Details  Name: Timothy Holloway MRN: [id number] Date of Birth: 06/05/1955  Today's Date: 01/27/2024 OT Individual Time: 1300-1345 OT Individual Time Calculation (min): 45 min    Short Term Goals: Week 2:  OT Short Term Goal 1 (Week 2): STG=LTG due to LOS  Skilled Therapeutic Interventions/Progress Updates:    Patient received seated at edge of bed, walked to main gym with rollator and without rest breaks.  Patient reported leg fatigue.  Provided ample rest time.  Worked on stepping up into home - approximately a 4rise.  Stepped up and backward down x 2.  Patient needing seated rest break.  5x Sit to stand - patient reporting fatigue and mild shortness of breath - no light headedness.  BP:  88/33.  Applied abdominal binder, allowed longer seated rest and BP as indictaed below.  Transported patient back to room in wheelchair.  Removed Teds and abd. binder and assisted patient to lie supine with feet elevated - pitting edema BLE, R>L.  Call bell and personal items in reach.     Therapy Documentation Precautions:  Precautions Precautions: Fall, Sternal Recall of Precautions/Restrictions: Impaired Precaution/Restrictions Comments: watch BP Restrictions Weight Bearing Restrictions Per Provider Order: No Other Position/Activity Restrictions: Sternal precautions   Vital Signs: Therapy Vitals Temp: 98.2 F (36.8 C) Pulse Rate: (!) 56 Resp: 17 BP: (!) 122/26 (with compression hose and abdominal binder) Patient Position (if appropriate): Sitting Oxygen Therapy SpO2: 98 % O2 Device: Room Air Pain:  Denies pain     Therapy/Group: Individual Therapy  Liberty Seto M 01/27/2024, 3:00 PM

## 2024-01-28 LAB — BASIC METABOLIC PANEL WITH GFR
Anion gap: 13 (ref 5–15)
BUN: 47 mg/dL — ABNORMAL HIGH (ref 8–23)
CO2: 27 mmol/L (ref 22–32)
Calcium: 8.9 mg/dL (ref 8.9–10.3)
Chloride: 93 mmol/L — ABNORMAL LOW (ref 98–111)
Creatinine, Ser: 5.59 mg/dL — ABNORMAL HIGH (ref 0.61–1.24)
GFR, Estimated: 10 mL/min — ABNORMAL LOW
Glucose, Bld: 109 mg/dL — ABNORMAL HIGH (ref 70–99)
Potassium: 4.4 mmol/L (ref 3.5–5.1)
Sodium: 132 mmol/L — ABNORMAL LOW (ref 135–145)

## 2024-01-28 LAB — GLUCOSE, CAPILLARY
Glucose-Capillary: 108 mg/dL — ABNORMAL HIGH (ref 70–99)
Glucose-Capillary: 104 mg/dL — ABNORMAL HIGH (ref 70–99)
Glucose-Capillary: 124 mg/dL — ABNORMAL HIGH (ref 70–99)
Glucose-Capillary: 144 mg/dL — ABNORMAL HIGH (ref 70–99)

## 2024-01-28 NOTE — Progress Notes (Signed)
 Physical Therapy Session Note  Patient Details  Name: Timothy Holloway MRN: [id number] Date of Birth: [birth year]  Today's Date: 01/28/2024 PT Individual Time: 8694-8654 PT Individual Time Calculation (min): 40 min   Short Term Goals: Week 2:  PT Short Term Goal 1 (Week 2): STG= LTG  Skilled Therapeutic Interventions/Progress Updates: Pt presented sitting EOB agreeable to therapy. Pt states unrated sternal pain, nsg arrived at start of session to provide pain meds and pm meds. BP checked prior to mobility 118/47 (66) HR 55. Pt performed Sit to stand from slightly elevated bed without AD while awaiting meds. Pt then provided with rollator and ambulated to main gym with supervision. Pt c/o increased BLE weakness. Discussed activities with pt stating did a lot of walking yesterday which may be a contributing factor. Pt participated in alternating toe taps to 4in step 2 x 10 with seated rest between bouts. Pt also participated in use of rebounder and dodgeball 2 x 10 for increased balance challenge. Pt then ambulated to ortho gym in preparation for NuStep. Upon entry to gym pt noted increased DOE and stating increased BLE fatigue. BP checked 143/34 pt asymptomatic. PTA obtained w/c and pt performed stand step to w/c and PTA transported pt back to room. Pt completed stand step to bed without AD and CGA. Pt completed sit to supine with supervision, repositioned to comfort and left with call bell within reach and needs met.      Therapy Documentation Precautions:  Precautions Precautions: Fall, Sternal Recall of Precautions/Restrictions: Impaired Precaution/Restrictions Comments: watch BP Restrictions Weight Bearing Restrictions Per Provider Order: No Other Position/Activity Restrictions: Sternal precautions  Therapy/Group: Individual Therapy  Ludene Stokke 01/28/2024, 4:10 PM

## 2024-01-28 NOTE — Plan of Care (Signed)
" °  Problem: Education: Goal: Ability to describe self-care measures that may prevent or decrease complications (Diabetes Survival Skills Education) will improve Outcome: Progressing Goal: Individualized Educational Video(s) Outcome: Progressing   Problem: Coping: Goal: Ability to adjust to condition or change in health will improve Outcome: Progressing   Problem: Fluid Volume: Goal: Ability to maintain a balanced intake and output will improve Outcome: Progressing   Problem: Health Behavior/Discharge Planning: Goal: Ability to identify and utilize available resources and services will improve Outcome: Progressing Goal: Ability to manage health-related needs will improve Outcome: Progressing   Problem: Metabolic: Goal: Ability to maintain appropriate glucose levels will improve Outcome: Progressing   Problem: Nutritional: Goal: Maintenance of adequate nutrition will improve Outcome: Progressing Goal: Progress toward achieving an optimal weight will improve Outcome: Progressing   Problem: Skin Integrity: Goal: Risk for impaired skin integrity will decrease Outcome: Progressing   Problem: Tissue Perfusion: Goal: Adequacy of tissue perfusion will improve Outcome: Progressing   Problem: Consults Goal: RH STROKE PATIENT EDUCATION Description: See Patient Education module for education specifics  Outcome: Progressing Goal: Nutrition Consult-if indicated Outcome: Progressing Goal: Diabetes Guidelines if Diabetic/Glucose > 140 Description: If diabetic or lab glucose is > 140 mg/dl - Initiate Diabetes/Hyperglycemia Guidelines & Document Interventions  Outcome: Progressing   Problem: RH BOWEL ELIMINATION Goal: RH STG MANAGE BOWEL WITH ASSISTANCE Description: STG Manage Bowel with Assistance. Outcome: Progressing Goal: RH STG MANAGE BOWEL W/MEDICATION W/ASSISTANCE Description: STG Manage Bowel with Medication with Assistance. Outcome: Progressing   Problem: RH SAFETY Goal:  RH STG ADHERE TO SAFETY PRECAUTIONS W/ASSISTANCE/DEVICE Description: STG Adhere to Safety Precautions With Assistance/Device. Outcome: Progressing   Problem: RH PAIN MANAGEMENT Goal: RH STG PAIN MANAGED AT OR BELOW PT'S PAIN GOAL Outcome: Progressing   Problem: RH KNOWLEDGE DEFICIT Goal: RH STG INCREASE KNOWLEDGE OF DIABETES Outcome: Progressing Goal: RH STG INCREASE KNOWLEDGE OF HYPERTENSION Outcome: Progressing Goal: RH STG INCREASE KNOWLEGDE OF HYPERLIPIDEMIA Outcome: Progressing Goal: RH STG INCREASE KNOWLEDGE OF STROKE PROPHYLAXIS Outcome: Progressing   "

## 2024-01-28 NOTE — Plan of Care (Signed)
" °  Problem: Coping: Goal: Ability to adjust to condition or change in health will improve Outcome: Progressing   Problem: Fluid Volume: Goal: Ability to maintain a balanced intake and output will improve Outcome: Progressing   Problem: Metabolic: Goal: Ability to maintain appropriate glucose levels will improve Outcome: Progressing Snacks available for low BS Problem: Nutritional: Goal: Maintenance of adequate nutrition will improve Outcome: Progressing   Problem: Skin Integrity: Goal: Risk for impaired skin integrity will decrease Outcome: Progressing   Problem: RH PAIN MANAGEMENT Goal: RH STG PAIN MANAGED AT OR BELOW PT'S PAIN GOAL Outcome: Progressing   Problem: RH KNOWLEDGE DEFICIT Goal: RH STG INCREASE KNOWLEDGE OF DIABETES Outcome: Progressing     "

## 2024-01-28 NOTE — Progress Notes (Signed)
 Physical Therapy Session Note  Patient Details  Name: Timothy Holloway MRN: [id number] Date of Birth: 08-Jan-1956  Today's Date: 01/28/2024 PT Individual Time: 0800-0900 PT Individual Time Calculation (min): 60 min   Short Term Goals: Week 2:  PT Short Term Goal 1 (Week 2): STG= LTG  Skilled Therapeutic Interventions/Progress Updates:     Pt seated EOB upon arrival. Pt denies pain and dizziness and agreeable to therapy. Assessed BP for orthostatics at start of session: Seated- 134/43, HR 55; Standing- 95/34, HR 68. Upon initial stand, stood for ~3 min and requested to sit down due to pt reported weakness in B LE. Pt was wearing knee high TEDs. PT donned thigh high TEDs and abd binder. Pt am to/from ortho gym using rollator and CGA/SBA. Pt reported increasing weakness in B LE. Assessed BP: Seated- 151/46, HR 55; Standing- 129/35, HR 65, SpO2 97%. Pt amb back to room and PT applied ACE wrap to B LE. Pt remained seated EOB with all needs in reach at end of session.  Therapy Documentation Precautions:  Precautions Precautions: Fall, Sternal Recall of Precautions/Restrictions: Impaired Precaution/Restrictions Comments: watch BP Restrictions Weight Bearing Restrictions Per Provider Order: No Other Position/Activity Restrictions: Sternal precautions  Therapy/Group: Individual Therapy  Comer CHRISTELLA Levora Comer Levora, PT, DPT 01/28/2024, 7:23 AM

## 2024-01-28 NOTE — Progress Notes (Signed)
 Occupational Therapy Session Note  Patient Details  Name: Timothy Holloway MRN: [id number] Date of Birth: 03-Dec-1955  Today's Date: 01/28/2024 OT Individual Time: 1030-1145 OT Individual Time Calculation (min): 75 min    Short Term Goals: Week 1:  OT Short Term Goal 1 (Week 1): Pt will complete LB with AE PRN with CGA OT Short Term Goal 1 - Progress (Week 1): Met OT Short Term Goal 2 (Week 1): Pt will complete toilet transfer with CGA using LRAD OT Short Term Goal 2 - Progress (Week 1): Met OT Short Term Goal 3 (Week 1): Pt will adhere to sternal precautions 90% of the time during functional ADL task with min cues OT Short Term Goal 3 - Progress (Week 1): Met  Skilled Therapeutic Interventions/Progress Updates:    Patient resting in bed at the time of arrival with no pain to report.  The pt indicated that he rested well during the night. The pt was in agreement with competing BADL related task in dressing.   BADL Related Task: The  patient presented with the following vitals with his binder in place and his compression sock and leg wraps on BP of 181/41, pulse of 57 and a O2 saturation of 98%.  I removed the patient's binder and compression hose and his BP at rest was 125/72, his pulse was 61 , and his 02 saturation was 99%.  The pt was  able to verbalize his sternal precaution, he was able to demonstrate effective carryover with sit to stands 4x, using his legs with closeS. The pt was instructed to incorporate relaxatio breathing and to analyze his internal environment in relation to episodes of dizziness with no occurrences during this treatment session. The pt was instructed to pace himself as a means of minimizing adverse response. The pt indicated that he needed to go to the restroom. The pt was able to come from sit to stand using the Rollator with closeS. The pt was able to doff his LB garments with MinA for his underwear and pants with placement of one hand on the grab bar. The pt was  able to come into stand using his legs for toileting hygiene with closeS. The pt was able to donn his underwear and pants with MinA. The patient was able to ambulate to the sink using the Rollator with closeS. The pt returned to EOB with closeS for placement. I noticed the patient's underwear, pants and socks were wet so I assisted the patient with changing his clothing items. The pt was MaxA for doffing his pants and underwear, he was ModA for donning the underwear and pants using the long hand reacher. The pt was Dep with his compression socks and non-skid socks. At the end of the session, the pt remained at EOB with the call light and bed alarm activated. All additional needs were addressed prior to me exiting the room.  Therapy Documentation Precautions:  Precautions Precautions: Fall, Sternal Recall of Precautions/Restrictions: Impaired Precaution/Restrictions Comments: watch BP Restrictions Weight Bearing Restrictions Per Provider Order: No Other Position/Activity Restrictions: Sternal precautions  Therapy/Group: Individual Therapy  Elvera JONETTA Mace 01/28/2024, 6:12 PM

## 2024-01-28 NOTE — Progress Notes (Signed)
 "                                                        PROGRESS NOTE   Subjective/Complaints:  Pt reports doing well- sitting up finishing breakfast.   Ate 100% tray.  Feels like swelling the same as yesterday but worse than the days prior.    ROS: +orthostasis, +dizziness- improved, fatigue improved Per HPI  Pt denies SOB, abd pain, CP, N/V/C/D, and vision changes    Objective:   No results found.   Recent Labs    01/26/24 0609 01/27/24 0626  WBC 10.9* 9.2  HGB 8.2* 7.8*  HCT 25.7* 24.4*  PLT 418* 363   Recent Labs    01/26/24 1412  NA 128*  K 4.3  CL 92*  CO2 24  GLUCOSE 137*  BUN 63*  CREATININE 6.72*  CALCIUM  8.6*     Intake/Output Summary (Last 24 hours) at 01/28/2024 9083 Last data filed at 01/28/2024 9187 Gross per 24 hour  Intake 720 ml  Output --  Net 720 ml     Wound 01/18/24 1604 Pressure Injury Sacrum Unstageable - Full thickness tissue loss in which the base of the injury is covered by slough (yellow, tan, gray, green or brown) and/or eschar (tan, brown or black) in the wound bed. (Active)    Physical Exam: Vital Signs Blood pressure (!) 142/63, pulse 65, temperature 98.1 F (36.7 C), resp. rate 18, height 6' (1.829 m), weight 135.7 kg, SpO2 98%.    General: awake, alert, appropriate, sitting up at EOB, finished 100% tray, remembered me from yesterday; NAD HENT: conjugate gaze; oropharynx moist CV: regular rate and rhythm; no JVD Pulmonary: CTA B/L; no W/R/R- slightly decreased at bases B/L GI: soft, NT, ND, (+)BS Psychiatric: appropriate Neurological: alert- Skin- sternal incision Extremities: 2-3+ LE edema to distal calves B/L Neuro: Alert and oriented x3, decreased sensation in bilateral feet. 5/5 strength throughout- stable 12/26  Assessment/Plan: 1. Functional deficits which require 3+ hours per day of interdisciplinary therapy in a comprehensive inpatient rehab setting. Physiatrist is providing close team supervision  and 24 hour management of active medical problems listed below. Physiatrist and rehab team continue to assess barriers to discharge/monitor patient progress toward functional and medical goals  Care Tool:  Bathing    Body parts bathed by patient: Chest, Left arm, Right arm, Abdomen, Front perineal area, Right upper leg, Left upper leg, Face   Body parts bathed by helper: Right lower leg, Left lower leg, Buttocks     Bathing assist Assist Level: Moderate Assistance - Patient 50 - 74%     Upper Body Dressing/Undressing Upper body dressing   What is the patient wearing?: Pull over shirt    Upper body assist Assist Level: Supervision/Verbal cueing    Lower Body Dressing/Undressing Lower body dressing      What is the patient wearing?: Pants     Lower body assist Assist for lower body dressing: Contact Guard/Touching assist     Toileting Toileting    Toileting assist Assist for toileting: Moderate Assistance - Patient 50 - 74%     Transfers Chair/bed transfer  Transfers assist     Chair/bed transfer assist level: Minimal Assistance - Patient > 75%     Locomotion Ambulation   Ambulation assist  Assist level: Contact Guard/Touching assist Assistive device: Rollator Max distance: 166   Walk 10 feet activity   Assist     Assist level: Contact Guard/Touching assist Assistive device: Rollator   Walk 50 feet activity   Assist Walk 50 feet with 2 turns activity did not occur: Safety/medical concerns (fatigue, SOB, dizziness, weakness/deconditioning)  Assist level: Contact Guard/Touching assist Assistive device: Rollator    Walk 150 feet activity   Assist Walk 150 feet activity did not occur: Safety/medical concerns (fatigue, SOB, dizziness, weakness/deconditioning)  Assist level: Contact Guard/Touching assist Assistive device: Rollator    Walk 10 feet on uneven surface  activity   Assist Walk 10 feet on uneven surfaces activity did not  occur: Safety/medical concerns (fatigue, SOB, dizziness, weakness/deconditioning)         Wheelchair     Assist Is the patient using a wheelchair?: Yes Type of Wheelchair: Manual    Wheelchair assist level: Dependent - Patient 0% Max wheelchair distance: 139ft    Wheelchair 50 feet with 2 turns activity    Assist        Assist Level: Dependent - Patient 0%   Wheelchair 150 feet activity     Assist      Assist Level: Dependent - Patient 0%   Blood pressure (!) 142/63, pulse 65, temperature 98.1 F (36.7 C), resp. rate 18, height 6' (1.829 m), weight 135.7 kg, SpO2 98%.  Medical Problem List and Plan: 1. 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  2. Pain: d/c oxycodone , continue tramadol  for moderate pain.              --Continue lidocaine  patch to chest wall for local measures  12/27-12/28-denies pain  3. Therapy: TEDs/Binder as continues to have BP drop but asymptomatic per OT notes -increase midodrine  to 15mg  TID, continue this dose, discussed that dizziness has improved  6. 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   -sternal incision assessed and is healing well  7.  T2DM: Hgb A1c- 9.9. Monitor BS ac/hs, Add CM restrictions to diet. Used Lantus  50 units PTA  -decrease aspart to 6U with meals due to hypoglycemia. D/c ISS CBG (last 3)  Recent Labs    01/27/24 1602 01/27/24 2030 01/28/24 0618  GLUCAP 110* 184* 108*   Will reduce lantus  to 22U, continue this dose  12/27- will reduce Lantus  to 20 units since dropped to 68 this AM- also attempted to order night snack, although required  a comment to get it in the note  12/28- Pt's BG's looking better in last 24 hours- con't regimen-  8. ESRD: Off CRRT and back on MWF HD schedule. Continue fluid restriction --schedule therapy at the end of the day to help with tolerance of therapy.  12/28- LE edema a little worse- weight UP 1 LB- will order ACE wraps- don't think TEDs would fit-  9.  Acute on chronic anemia: Hgb reviewed and was 9 on  12/9. On Epo Reviewed stool occult- one was positive and one was negative, Hgb did not improve as much as expected with 1U PRBCs on 12/25- CT abdomen ordered and was negative for acute bleed. Hgb reviewed 12/26 and improved. Heparin  decreased to q12H  12/27- Hb 7.8- about his normal last 7 days or so- running 7.0-8.0 10. Leucocytosis: Has had leucocytosis which continues to fluctuate             --12.2 at admission    12/27- WBC 9.2 today          Latest Ref Rng & Units 01/27/2024    6:26 AM 01/26/2024    6:09 AM 01/25/2024    8:54 AM  CBC  WBC 4.0 - 10.5 K/uL 9.2  10.9  10.5   Hemoglobin 13.0 - 17.0 g/dL 7.8  8.2  7.4   Hematocrit 39.0 - 52.0 % 24.4  25.7  22.4   Platelets 150 - 400 K/uL 363  418  407      11. Constipation: Change dulcolax to senna S 2 bid. LBM 12/25- d/c suppository  12/27- LBM yesterday- type 6 12.  Low protein stores: Albumin  reviewed and is improving, d/c Juven  13. Odynophagia: continue MMW, messaged nursing to see if this is still an issue  14. Class 2 Obesity: BMI- 37.4 on 12/20 Educate on diet and exercise to promote overall health and mobility.   12/28- BMI up to 40.48 today- large jump considering his fluid status/ESRD- will d/w renal  12/28- per pt and nursing, sticking to 1200cc/day of fluid restriction- in spite of Na down to 128 15. Atelectasis: continue flutter valve.  16. Hyponatremia  12/28- Na 128- running 128- 136 in last 1 week- will d/w renal  12/28- renal felt pt might not be sticking to 1200cc/day- per staff and pt, he is- will recheck  BMP today   I spent a total of  50  minutes on total care today- >50% coordination of care- due to  D/w team about pt- started ACE wraps- reviewed new labs when they come back  and vitals also d/w pt at length about swelling-   LOS: 10 days A FACE TO FACE EVALUATION WAS PERFORMED  Timothy Holloway 01/28/2024, 9:16 AM     "

## 2024-01-28 NOTE — Progress Notes (Signed)
 " [facility] Progress Note   Subjective:   Seen in room. No complaints. Denies cp, sob.   Objective Vitals:   01/27/24 1339 01/27/24 2028 01/28/24 0336 01/28/24 0600  BP: (!) 122/26 (!) 132/46 (!) 142/63   Pulse: (!) 56 (!) 59 65   Resp:  18 18   Temp:  98.1 F (36.7 C) 98.1 F (36.7 C)   TempSrc:      SpO2: 98% 96% 98%   Weight:    135.7 kg  Height:       Physical Exam General: Well appearing, NAD. Room air Heart: RRR Lungs: CTAB Abdomen: abd binder in place Extremities: 1+ BLE edema Dialysis Access: TDC in R chest  Additional Objective Labs: Basic Metabolic Panel: Recent Labs  Lab 01/24/24 0911 01/26/24 1412  NA 128* 128*  K 4.0 4.3  CL 90* 92*  CO2 27 24  GLUCOSE 121* 137*  BUN 46* 63*  CREATININE 4.58* 6.72*  CALCIUM  8.4* 8.6*  PHOS 4.1 4.2   Liver Function Tests: Recent Labs  Lab 01/24/24 0911 01/26/24 1412  ALBUMIN  3.2* 3.2*   CBC: Recent Labs  Lab 01/24/24 0911 01/25/24 0854 01/26/24 0609 01/27/24 0626  WBC 10.5 10.5 10.9* 9.2  NEUTROABS 7.8* 7.5 7.4 6.2  HGB 7.1* 7.4* 8.2* 7.8*  HCT 22.1* 22.4* 25.7* 24.4*  MCV 94.0 90.7 92.8 92.8  PLT 411* 407* 418* 363   Medications:   aspirin  EC  81 mg Oral Daily   atorvastatin   80 mg Oral Daily   Chlorhexidine  Gluconate Cloth  6 each Topical Q12H   cholecalciferol   1,000 Units Oral Daily   clopidogrel   75 mg Oral Daily   collagenase    Topical Daily   darbepoetin (ARANESP ) injection - DIALYSIS  150 mcg Subcutaneous Q Thu-1800   Gerhardt's butt cream   Topical Daily   heparin   5,000 Units Subcutaneous Q12H   insulin  aspart  5 Units Subcutaneous TID WC   insulin  glargine  20 Units Subcutaneous BID   lidocaine   2 patch Transdermal Q24H   magic mouthwash  5 mL Oral QID   midodrine   15 mg Oral TID WC   multivitamin  1 tablet Oral QHS   pantoprazole   40 mg Oral Daily   senna-docusate  2 tablet Oral BID   sodium chloride  flush  10-40 mL Intracatheter Q12H   sucroferric  oxyhydroxide  500 mg Oral TID WC   zolpidem   5 mg Oral QHS    Dialysis Orders MWF - [facility] 4h B350  133.5kg  TDC  Heparin  2000+ 1400u/hr Calcitriol 1.25 three times per week Mircera 90 mcg q 2 wks (last 12/1)  CXR 12/11-> resolved vasc congestion, improved IS edema   Assessment/Plan: ESRD - usual HD MWF. CRRT used from 12/10 - 12/13 for BP stability in the face of stroke-like symptoms. Stable off of CRRT. Next HD Monday.  BP/volume: Midodrine  has been ^ to 15mg  TID, tolerating. Still with some edema. Push UF with next HD.  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 S/p 1 u prbcs 12/24.  Secondary HPTH: Ca ok, last Phos high - restarted Velphoro  NSTEMI - 3V CAD by LHC -> s/p CABG 01/08/24 Stroke-like symptoms: was waxing/ waning per neuro. There was no acute CVA by imaging, but due to advanced carotid disease by CTA pt underwent L carotid artery stent per [facility] 12/10.  Nutrition: Alb low, on supplements.   Maisie Ronnald Acosta PA-C [facility]  01/28/2024,11:00 AM    "

## 2024-01-29 DIAGNOSIS — Z794 Long term (current) use of insulin: Secondary | ICD-10-CM

## 2024-01-29 DIAGNOSIS — I959 Hypotension, unspecified: Secondary | ICD-10-CM

## 2024-01-29 DIAGNOSIS — D72829 Elevated white blood cell count, unspecified: Secondary | ICD-10-CM

## 2024-01-29 DIAGNOSIS — E1169 Type 2 diabetes mellitus with other specified complication: Secondary | ICD-10-CM

## 2024-01-29 DIAGNOSIS — K5900 Constipation, unspecified: Secondary | ICD-10-CM

## 2024-01-29 LAB — BASIC METABOLIC PANEL WITH GFR
Anion gap: 12 (ref 5–15)
BUN: 56 mg/dL — ABNORMAL HIGH (ref 8–23)
CO2: 26 mmol/L (ref 22–32)
Calcium: 8.7 mg/dL — ABNORMAL LOW (ref 8.9–10.3)
Chloride: 95 mmol/L — ABNORMAL LOW (ref 98–111)
Creatinine, Ser: 6.52 mg/dL — ABNORMAL HIGH (ref 0.61–1.24)
GFR, Estimated: 9 mL/min — ABNORMAL LOW
Glucose, Bld: 107 mg/dL — ABNORMAL HIGH (ref 70–99)
Potassium: 4.4 mmol/L (ref 3.5–5.1)
Sodium: 132 mmol/L — ABNORMAL LOW (ref 135–145)

## 2024-01-29 LAB — CBC WITH DIFFERENTIAL/PLATELET
Abs Immature Granulocytes: 0.09 K/uL — ABNORMAL HIGH (ref 0.00–0.07)
Basophils Absolute: 0.1 K/uL (ref 0.0–0.1)
Basophils Relative: 1 %
Eosinophils Absolute: 1 K/uL — ABNORMAL HIGH (ref 0.0–0.5)
Eosinophils Relative: 10 %
HCT: 22.8 % — ABNORMAL LOW (ref 39.0–52.0)
Hemoglobin: 7.3 g/dL — ABNORMAL LOW (ref 13.0–17.0)
Immature Granulocytes: 1 %
Lymphocytes Relative: 13 %
Lymphs Abs: 1.3 K/uL (ref 0.7–4.0)
MCH: 30 pg (ref 26.0–34.0)
MCHC: 32 g/dL (ref 30.0–36.0)
MCV: 93.8 fL (ref 80.0–100.0)
Monocytes Absolute: 1.1 K/uL — ABNORMAL HIGH (ref 0.1–1.0)
Monocytes Relative: 11 %
Neutro Abs: 6.6 K/uL (ref 1.7–7.7)
Neutrophils Relative %: 64 %
Platelets: 358 K/uL (ref 150–400)
RBC: 2.43 MIL/uL — ABNORMAL LOW (ref 4.22–5.81)
RDW: 15.8 % — ABNORMAL HIGH (ref 11.5–15.5)
WBC: 10.2 K/uL (ref 4.0–10.5)
nRBC: 0 % (ref 0.0–0.2)

## 2024-01-29 LAB — GLUCOSE, CAPILLARY
Glucose-Capillary: 107 mg/dL — ABNORMAL HIGH (ref 70–99)
Glucose-Capillary: 91 mg/dL (ref 70–99)
Glucose-Capillary: 76 mg/dL (ref 70–99)
Glucose-Capillary: 190 mg/dL — ABNORMAL HIGH (ref 70–99)

## 2024-01-29 MED ORDER — PENTAFLUOROPROP-TETRAFLUOROETH EX AERO: 1.0000 | INHALATION_SPRAY | CUTANEOUS | Status: DC | PRN

## 2024-01-29 MED ORDER — HEPARIN SODIUM (PORCINE) 1000 UNIT/ML IJ SOLN
INTRAMUSCULAR | Status: AC
  Filled 2024-01-29: qty 4

## 2024-01-29 MED ORDER — POLYETHYLENE GLYCOL 3350 17 G PO PACK
17.0000 g | PACK | Freq: Every day | ORAL | Status: DC
  Administered 2024-01-30 – 2024-01-31 (×2): 17 g via ORAL
  Filled 2024-01-29 (×2): qty 1

## 2024-01-29 MED ORDER — HEPARIN SODIUM (PORCINE) 1000 UNIT/ML IJ SOLN
INTRAMUSCULAR | Status: AC
  Filled 2024-01-29: qty 3

## 2024-01-29 MED ORDER — HEPARIN SODIUM (PORCINE) 1000 UNIT/ML DIALYSIS
1000.0000 [IU] | INTRAMUSCULAR | Status: DC | PRN
  Administered 2024-01-29: 3200 [IU]

## 2024-01-29 MED ORDER — HEPARIN SODIUM (PORCINE) 1000 UNIT/ML DIALYSIS
20.0000 [IU]/kg | INTRAMUSCULAR | Status: DC | PRN
  Administered 2024-01-29: 2700 [IU] via INTRAVENOUS_CENTRAL

## 2024-01-29 MED ORDER — ALTEPLASE 2 MG IJ SOLR: 2.0000 mg | Freq: Once | INTRAMUSCULAR | Status: DC | PRN

## 2024-01-29 MED ORDER — MIDODRINE HCL 5 MG PO TABS
10.0000 mg | ORAL_TABLET | Freq: Three times a day (TID) | ORAL | Status: DC
  Administered 2024-01-29 – 2024-01-30 (×2): 10 mg via ORAL
  Filled 2024-01-29 (×2): qty 2

## 2024-01-29 MED ORDER — LIDOCAINE HCL (PF) 1 % IJ SOLN: 5.0000 mL | INTRAMUSCULAR | Status: DC | PRN

## 2024-01-29 MED ORDER — LIDOCAINE-PRILOCAINE 2.5-2.5 % EX CREA: 1.0000 | TOPICAL_CREAM | CUTANEOUS | Status: DC | PRN

## 2024-01-29 MED ORDER — ANTICOAGULANT SODIUM CITRATE 4% (200MG/5ML) IV SOLN: 5.0000 mL | Status: DC | PRN

## 2024-01-29 NOTE — Progress Notes (Signed)
 " [facility] Progress Note   Subjective:     Seen and examined on dialysis. Procedure supervised.  Blood pressure 147/57 and HR 53.  Tolerating goal.  RIJ tunn catheter in use.  He had 8 unmeasured urine voids over 12/28.  No hypotension during tx per HD RN - his midday dose was held per nursing report.   Review of systems:  Denies shortness of breath or chest pain  Denies n/v    Objective Vitals:   01/29/24 1600 01/29/24 1630 01/29/24 1700 01/29/24 1730  BP: (!) 146/58 (!) 141/90 (!) 151/62 (!) 162/65  Pulse: (!) 50 (!) 51 (!) 52 (!) 52  Resp: 14 14 13 12   Temp:      TempSrc:      SpO2: 100% 97% 100% 97%  Weight:      Height:       Physical Exam  General elderly male in bed in no acute distress HEENT normocephalic atraumatic extraocular movements intact sclera anicteric Neck supple trachea midline Lungs clear to auscultation bilaterally normal work of breathing at rest  Heart S1S2 no rub Abdomen soft nontender nondistended Extremities 1+ edema  Psych normal mood and affect Neuro - alert and oriented x 3 provides hx and follows commands  Access RIJ tunn catheter in use    Additional Objective Labs: Basic Metabolic Panel: Recent Labs  Lab 01/24/24 0911 01/26/24 1412 01/28/24 1057 01/29/24 0513  NA 128* 128* 132* 132*  K 4.0 4.3 4.4 4.4  CL 90* 92* 93* 95*  CO2 27 24 27 26   GLUCOSE 121* 137* 109* 107*  BUN 46* 63* 47* 56*  CREATININE 4.58* 6.72* 5.59* 6.52*  CALCIUM  8.4* 8.6* 8.9 8.7*  PHOS 4.1 4.2  --   --    Liver Function Tests: Recent Labs  Lab 01/24/24 0911 01/26/24 1412  ALBUMIN  3.2* 3.2*   CBC: Recent Labs  Lab 01/24/24 0911 01/25/24 0854 01/26/24 0609 01/27/24 0626 01/29/24 0513  WBC 10.5 10.5 10.9* 9.2 10.2  NEUTROABS 7.8* 7.5 7.4 6.2 6.6  HGB 7.1* 7.4* 8.2* 7.8* 7.3*  HCT 22.1* 22.4* 25.7* 24.4* 22.8*  MCV 94.0 90.7 92.8 92.8 93.8  PLT 411* 407* 418* 363 358   Medications:  anticoagulant sodium citrate        aspirin  EC  81 mg Oral Daily   atorvastatin   80 mg Oral Daily   Chlorhexidine  Gluconate Cloth  6 each Topical Q12H   cholecalciferol   1,000 Units Oral Daily   clopidogrel   75 mg Oral Daily   collagenase    Topical Daily   darbepoetin (ARANESP ) injection - DIALYSIS  150 mcg Subcutaneous Q Thu-1800   Gerhardt's butt cream   Topical Daily   heparin   5,000 Units Subcutaneous Q12H   insulin  aspart  5 Units Subcutaneous TID WC   insulin  glargine  20 Units Subcutaneous BID   lidocaine   2 patch Transdermal Q24H   magic mouthwash  5 mL Oral QID   midodrine   10 mg Oral TID WC   multivitamin  1 tablet Oral QHS   pantoprazole   40 mg Oral Daily   polyethylene glycol  17 g Oral Daily   senna-docusate  2 tablet Oral BID   sodium chloride  flush  10-40 mL Intracatheter Q12H   sucroferric oxyhydroxide  500 mg Oral TID WC   zolpidem   5 mg Oral QHS    Dialysis Orders MWF - [facility] 4h B350  133.5kg  TDC  Heparin  2000+ 1400u/hr Calcitriol 1.25 three times per week  Mircera 90 mcg q 2 wks (last 12/1)  CXR 12/11-> resolved vasc congestion, improved IS edema   Assessment/Plan: ESRD - usual HD schedule MWF. CRRT used from 12/10 - 12/13 for BP stability in the face of stroke-like symptoms. Now back on HD per MWF schedule  BP/volume: reduce midodrine  to 10 mg TID - dose was recently held.  Optimizing UF with HD  Anemia of ESRD: Aranesp  150mcg q Thursday. S/p 1 u prbcs 12/24.  Secondary HPTH: on Velphoro  NSTEMI - 3V CAD by LHC -> s/p CABG 01/08/24 Stroke-like symptoms: was waxing/ waning per neuro. There was no acute CVA by imaging, but due to advanced carotid disease by CTA pt underwent L carotid artery stent per [facility] 12/10.   Disposition - in rehab    Katheryn JAYSON Saba, MD [facility] 01/29/2024,6:01 PM    "

## 2024-01-29 NOTE — Progress Notes (Signed)
 Occupational Therapy Session Note  Patient Details  Name: Timothy Holloway MRN: [id number] Date of Birth: [birth year]  Today's Date: 01/29/2024 OT Individual Time: 9197-9085 OT Individual Time Calculation (min): 72 min    Short Term Goals: Week 2:  OT Short Term Goal 1 (Week 2): STG=LTG due to LOS  Skilled Therapeutic Interventions/Progress Updates:   Patient received seated at edge of bed.  Reports sleeping well last night, also indicates his BP was high yesterday - patient with chronic low BP.  Patient declined bathing and dressing, or need for toilet.  Assisted to don knee high ted hose and slipper socks.  Patient walked to main gym with supervision using rollator. Patient with shortness of breath after walk ~115ft.  5 min rest break to return to regular rate of breathing.  No light headedness or dizziness.  Patient not wearing abdominal binder.   Worked on dynamic stand balance and stand endurance.  Patient with loss of balance backward x 3 - able to notice and self correct.  Patient without any unsafe behaviors.  Worked on endurance with walking, and pacing himself to increase distance.  Discussed walking without walker in more confined spaces, e.g. home bathroom, and using it for more open spaes, e.g. car to door at dialysis center.   Patient walked back to room and sat at sink to shave. Patient walked to bathroom for continent void and to wash periarea.  Patient returned to bed at end of session to rest prior to PT session this am.  Discussed adjusting D/C date to accommodate HD schedule.  Sent secure message to team.    Therapy Documentation Precautions:  Precautions Precautions: Fall, Sternal Recall of Precautions/Restrictions: Impaired Precaution/Restrictions Comments: watch BP Restrictions Weight Bearing Restrictions Per Provider Order: No Other Position/Activity Restrictions: Sternal precautions   Vital Signs: Therapy Vitals Pulse Rate: (P) 63 BP: (!) (P) 147/55 (after  exercise) Patient Position (if appropriate): (P) Sitting Pain:  Denies pain     Therapy/Group: Individual Therapy  Dystany Duffy M 01/29/2024, 9:15 AM

## 2024-01-29 NOTE — Progress Notes (Signed)
 "                                                        PROGRESS NOTE   Subjective/Complaints:  No new complaints or concerns this morning.  Pain under control with tramadol .  Reports LBM about 2 days ago.  Plan for dialysis today.  ROS: +orthostasis, +dizziness- improved, fatigue improved Per HPI  Pt denies fevers, SOB, abd pain, CP, N/V/C/D, and vision changes    Objective:   No results found.   Recent Labs    01/27/24 0626 01/29/24 0513  WBC 9.2 10.2  HGB 7.8* 7.3*  HCT 24.4* 22.8*  PLT 363 358   Recent Labs    01/28/24 1057 01/29/24 0513  NA 132* 132*  K 4.4 4.4  CL 93* 95*  CO2 27 26  GLUCOSE 109* 107*  BUN 47* 56*  CREATININE 5.59* 6.52*  CALCIUM  8.9 8.7*     Intake/Output Summary (Last 24 hours) at 01/29/2024 1245 Last data filed at 01/29/2024 9247 Gross per 24 hour  Intake 600 ml  Output --  Net 600 ml     Wound 01/18/24 1604 Pressure Injury Sacrum Unstageable - Full thickness tissue loss in which the base of the injury is covered by slough (yellow, tan, gray, green or brown) and/or eschar (tan, brown or black) in the wound bed. (Active)    Physical Exam: Vital Signs Blood pressure (!) 147/55, pulse 63, temperature 97.6 F (36.4 C), resp. rate 18, height 6' (1.829 m), weight 133.2 kg, SpO2 96%.    General: awake, alert, appropriate, sitting up at EOB; NAD HENT: conjugate gaze; oropharynx moist CV: regular rate and rhythm; no JVD Pulmonary: CTA B/L; no W/R/R- slightly decreased at bases B/L GI: soft, NT, ND, (+)BS Psychiatric: appropriate, cooperative Neurological: alert and awake Skin- sternal incision-CDI Extremities: 2-3+ LE edema to distal calves B/L Neuro: Alert and oriented x3, decreased sensation in bilateral feet. 5/5 strength throughout-  Assessment/Plan: 1. Functional deficits which require 3+ hours per day of interdisciplinary therapy in a comprehensive inpatient rehab setting. Physiatrist is providing close team supervision  and 24 hour management of active medical problems listed below. Physiatrist and rehab team continue to assess barriers to discharge/monitor patient progress toward functional and medical goals  Care Tool:  Bathing    Body parts bathed by patient: Chest, Left arm, Right arm, Abdomen, Front perineal area, Right upper leg, Left upper leg, Face   Body parts bathed by helper: Right lower leg, Left lower leg, Buttocks     Bathing assist Assist Level: Moderate Assistance - Patient 50 - 74%     Upper Body Dressing/Undressing Upper body dressing   What is the patient wearing?: Pull over shirt    Upper body assist Assist Level: Supervision/Verbal cueing    Lower Body Dressing/Undressing Lower body dressing      What is the patient wearing?: Pants     Lower body assist Assist for lower body dressing:  (Sup for donning for toileting with accident the pt was Mod to MaxA underwear and pant for threading leg through the opening)     Toileting Toileting    Toileting assist Assist for toileting: Maximal Assistance - Patient 25 - 49%     Transfers Chair/bed transfer  Transfers assist     Chair/bed transfer assist level:  Supervision/Verbal cueing     Locomotion Ambulation   Ambulation assist      Assist level: Supervision/Verbal cueing Assistive device: Rollator Max distance: 166   Walk 10 feet activity   Assist     Assist level: Supervision/Verbal cueing Assistive device: Rollator   Walk 50 feet activity   Assist Walk 50 feet with 2 turns activity did not occur: Safety/medical concerns (fatigue, SOB, dizziness, weakness/deconditioning)  Assist level: Supervision/Verbal cueing Assistive device: Rollator    Walk 150 feet activity   Assist Walk 150 feet activity did not occur: Safety/medical concerns (fatigue, SOB, dizziness, weakness/deconditioning)  Assist level: Contact Guard/Touching assist Assistive device: Rollator    Walk 10 feet on uneven  surface  activity   Assist Walk 10 feet on uneven surfaces activity did not occur: Safety/medical concerns (fatigue, SOB, dizziness, weakness/deconditioning)         Wheelchair     Assist Is the patient using a wheelchair?: Yes Type of Wheelchair: Manual    Wheelchair assist level: Dependent - Patient 0% Max wheelchair distance: 161ft    Wheelchair 50 feet with 2 turns activity    Assist        Assist Level: Dependent - Patient 0%   Wheelchair 150 feet activity     Assist      Assist Level: Dependent - Patient 0%   Blood pressure (!) 147/55, pulse 63, temperature 97.6 F (36.4 C), resp. rate 18, height 6' (1.829 m), weight 133.2 kg, SpO2 96%.  Medical Problem List and Plan: 1. 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  2. Pain: d/c oxycodone , continue tramadol  for moderate pain.              --Continue lidocaine  patch to chest wall for local measures  12/29 reports pain is under control continue current regimen, he occasionally uses tramadol   3. Therapy: TEDs/Binder as continues to have BP drop but asymptomatic per OT notes -increase midodrine  to 15mg  TID, continue this dose, discussed that dizziness has improved - 12/29 patient has had a couple elevated BP readings, we will decrease midodrine  back to 10 mg 3 times daily for hypertension    01/29/2024    8:43 AM 01/29/2024    8:17 AM 01/29/2024    3:45 AM  Vitals with BMI  Weight   293 lbs 10 oz  BMI   39.82  Systolic 147 139 842  Diastolic 55 59 62  Pulse 63 54 70     6. 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   -sternal  incision assessed and is healing well  7.  T2DM: Hgb A1c- 9.9. Monitor BS ac/hs, Add CM restrictions to diet. Used Lantus  50 units PTA  -decrease aspart to 6U with meals due to hypoglycemia. D/c ISS   CBG (last 3)  Recent Labs    01/28/24 2050 01/29/24 0546 01/29/24 1201  GLUCAP 144* 107* 91   Will reduce lantus  to 22U, continue  this dose  12/27- will reduce Lantus  to 20 units since dropped to 68 this AM- also attempted to order night snack, although required a comment to get it in the note  12/28- Pt's BG's looking better in last 24 hours- con't regimen-   -12/29 CBG stable continue current regimen  8. ESRD: Off CRRT and back on MWF HD schedule. Continue fluid restriction --schedule therapy at the end of the day to help with tolerance of therapy.  12/28- LE edema a little worse- weight UP 1 LB- will order ACE wraps- don't think TEDs would fit-  12/29 patient scheduled for dialysis today, monitor renal note 9.  Acute on chronic anemia: Hgb reviewed and was 9 on 12/9. On Epo Reviewed stool occult- one was positive and one was negative, Hgb did not improve as much as expected with 1U PRBCs on 12/25- CT abdomen ordered and was negative for acute bleed. Hgb reviewed 12/26 and improved. Heparin  decreased to q12H  12/27- Hb 7.8- about his normal last 7 days or so- running 7.0-8.0 10. Leucocytosis: Has had leucocytosis which continues to fluctuate             --12.2 at admission    12/29 WBC 10.2 today    Latest Ref Rng & Units 01/29/2024    5:13 AM 01/27/2024    6:26 AM 01/26/2024    6:09 AM  CBC  WBC 4.0 - 10.5 K/uL 10.2  9.2  10.9   Hemoglobin 13.0 - 17.0 g/dL 7.3  7.8  8.2   Hematocrit 39.0 - 52.0 % 22.8  24.4  25.7   Platelets 150 - 400 K/uL 358  363  418      11. Constipation: Change dulcolax to senna S 2 bid. LBM 12/25- d/c suppository  12/27- LBM yesterday- type 6  12/29-hard BM documented yesterday, add MiraLAX  12.  Low protein stores: Albumin  reviewed and is  improving, d/c Juven  13. Odynophagia: continue MMW, messaged nursing to see if this is still an issue  14. Class 2 Obesity: BMI- 37.4 on 12/20 Educate on diet and exercise to promote overall health and mobility.   12/28- BMI up to 40.48 today- large jump considering his fluid status/ESRD- will d/w renal  12/28- per pt and nursing, sticking to 1200cc/day of fluid restriction- in spite of Na down to 128 15. Atelectasis: continue flutter valve.  16. Hyponatremia  12/28- Na 128- running 128- 136 in last 1 week- will d/w renal  12/28- renal felt pt might not be sticking to 1200cc/day- per staff and pt, he is- will recheck BMP today   LOS: 11 days A FACE TO FACE EVALUATION WAS PERFORMED  Timothy Holloway 01/29/2024, 12:45 PM     "

## 2024-01-29 NOTE — Progress Notes (Signed)
 Physical Therapy Session Note  Patient Details  Name: Timothy Holloway MRN: [id number] Date of Birth: 1955/08/07  Today's Date: 01/29/2024 PT Individual Time: 1000-1100 PT Individual Time Calculation (min): 60 min   Short Term Goals: Week 1:  PT Short Term Goal 1 (Week 1): pt will perform bed mobility with supervision using bed features PT Short Term Goal 1 - Progress (Week 1): Met PT Short Term Goal 2 (Week 1): pt will perform all transfers with LRAD and CGA PT Short Term Goal 2 - Progress (Week 1): Met PT Short Term Goal 3 (Week 1): pt will ambulate 12ft with LRAD and CGA PT Short Term Goal 3 - Progress (Week 1): Met Week 2:  PT Short Term Goal 1 (Week 2): STG= LTG  Skilled Therapeutic Interventions/Progress Updates:    Pt presents in bed, agreeable to session. Discussed d/c planning and pt reports discussion about extending a day to accommodate HD schedule (primary team was already notified). S for sit > stand without UE support and S for gait with rollator down to and back from therapy gym. Pt denies concerns in regards to functional mobility level upon d/c - reports he has practiced stairs and car transfer and feels comfortable with this. Focused on HEP to address overall strength, balance, and activity tolerance (see below). Education on energy conservation techniques as well as self pacing up on d/c.  After gait HR = 65 bpm and O2 = 98% - pt with increased work of breathing with activity. Also educated on option to get portable pulse ox for home to be able to monitor HR and O2.  Access Code: [email or web address] URL: [email or web address] Date: 01/29/2024 Prepared by: Donald  Exercises - Sit to Stand Without Arm Support  - 1 x daily - 7 x weekly - 2 sets - 5-10 reps - Standing Hip Abduction with Counter Support  - 1 x daily - 7 x weekly - 2 sets - 10 reps - Mini Squat with Counter Support  - 1 x daily - 7 x weekly - 3 sets - 10 reps - Heel Raises with Counter Support  - 1 x  daily - 7 x weekly - 3 sets - 10 reps - Toe Raises with Counter Support  - 1 x daily - 7 x weekly - 3 sets - 10 reps  Dynamic gait through obstacle course to simulate home environment with rollator with S overall, slow cadence. Rest break required after due to increased work of breathing.   Curb step negotiation with rollator with overall CGA - did assist with initial lift of rollator due to sternal precautions but pt performed otherwise with CGA overall.     TUG for fall risk assessment Trial 1 with Rollator = 33 sec Trial 2 without device = 31 sec    Therapy Documentation Precautions:  Precautions Precautions: Fall, Sternal Recall of Precautions/Restrictions: Impaired Precaution/Restrictions Comments: watch BP Restrictions Weight Bearing Restrictions Per Provider Order: No Other Position/Activity Restrictions: Sternal precautions  Therapy Vitals Pulse Rate: 63 BP: (!) 147/55 (after exercise) Patient Position (if appropriate): Sitting  Pain: Denies pain.     Therapy/Group: Individual Therapy  [name] Donald Sherrell Donald [name] [name], PT, DPT, CBIS  01/29/2024, 12:11 PM

## 2024-01-30 LAB — HEPATITIS B SURFACE ANTIGEN: Hepatitis B Surface Ag: NONREACTIVE

## 2024-01-30 LAB — GLUCOSE, CAPILLARY
Glucose-Capillary: 90 mg/dL (ref 70–99)
Glucose-Capillary: 72 mg/dL (ref 70–99)
Glucose-Capillary: 128 mg/dL — ABNORMAL HIGH (ref 70–99)
Glucose-Capillary: 120 mg/dL — ABNORMAL HIGH (ref 70–99)

## 2024-01-30 MED ORDER — MIDODRINE HCL 5 MG PO TABS
7.5000 mg | ORAL_TABLET | Freq: Three times a day (TID) | ORAL | Status: DC
  Administered 2024-01-30 – 2024-01-31 (×4): 7.5 mg via ORAL
  Filled 2024-01-30 (×3): qty 2

## 2024-01-30 MED ORDER — INSULIN GLARGINE 100 UNIT/ML ~~LOC~~ SOLN
17.0000 [IU] | Freq: Two times a day (BID) | SUBCUTANEOUS | Status: DC
  Administered 2024-01-30 – 2024-01-31 (×2): 17 [IU] via SUBCUTANEOUS
  Filled 2024-01-30 (×3): qty 0.17

## 2024-01-30 NOTE — Progress Notes (Signed)
 Physical Therapy Session Note  Patient Details  Name: Timothy Holloway MRN: [id number] Date of Birth: [birth year]  Today's Date: 01/30/2024 PT Individual Time: 0832-0930 PT Individual Time Calculation (min): 58 min   Short Term Goals: Week 1:  PT Short Term Goal 1 (Week 1): pt will perform bed mobility with supervision using bed features PT Short Term Goal 1 - Progress (Week 1): Met PT Short Term Goal 2 (Week 1): pt will perform all transfers with LRAD and CGA PT Short Term Goal 2 - Progress (Week 1): Met PT Short Term Goal 3 (Week 1): pt will ambulate 78ft with LRAD and CGA PT Short Term Goal 3 - Progress (Week 1): Met Week 2:  PT Short Term Goal 1 (Week 2): STG= LTG  Skilled Therapeutic Interventions/Progress Updates:    Pt presents in bed asleep but quick to awaken. Mod I to come to EOB using rail and HOB elevated. Donned tedhose total A for BP management. Pt denies any symptoms. BP taken seated 144/56 mmHg; HR = 56 bpm and then in standing (S) BP = 111/48 mmHg; HR = 67 bpm but pt denies any symptoms.  Functional gait training with rollator for functional strengthening and endurance and overall mobility training x 120' with S - pt tolerated better today with less work of breathing at the end of walk  Performed simulated car transfer to low SUV height at S level, pt managing BLE independently but with increased effort due to constraints of the simulated car.  Ramp negotiation for community re-entry and outdoor mobility at S level with good use of brakes and safety with rollator  NMR for balance retraining and standing tolerance on non compliant and then compliant surface without UE support x 2 min intervals with BITS balance system using user paced program in standing with S and then CGA overall on compliant surface (increased sway noted and increased overall cardiovascular effort).  Nustep for BLE strengthening and cardiovascular endurance training with emphasis on Pace Partner  program for interval training x 4 min on level 1 with target of 55 steps per min, then x 4 min on level 3 with same target of 55 steps per min. Pt rated as 11 on Borg. Avg 38 steps per min.  Gait back to room with rollator with S x 120' with focus on overall endurance, functional mobility, balance and strengthening.  Returned back to bed and all needs in reach.    Therapy Documentation Precautions:  Precautions Precautions: Fall, Sternal Recall of Precautions/Restrictions: Impaired Precaution/Restrictions Comments: watch BP Restrictions Weight Bearing Restrictions Per Provider Order: No Other Position/Activity Restrictions: Sternal precautions   Pain: Denies pain.    Therapy/Group: Individual Therapy  [name] [name] [name] [name], PT, DPT, CBIS  01/30/2024, 11:14 AM

## 2024-01-30 NOTE — Progress Notes (Addendum)
 " [facility] Progress Note   Subjective:   Seen in room - no new issues today. No CP/dyspnea. For HD tomorrow.  Objective Vitals:   01/29/24 1839 01/29/24 2101 01/30/24 0604 01/30/24 1345  BP: (!) 169/45 (!) 149/47 (!) 154/54 (!) 120/43  Pulse: 60 69 66 66  Resp: 16 18 18 18   Temp: 97.8 F (36.6 C) 97.9 F (36.6 C) 97.9 F (36.6 C) 97.6 F (36.4 C)  TempSrc: Oral   Oral  SpO2: 97% 92% 97% 95%  Weight:   133.9 kg   Height:       Physical Exam General: Well appearing, NAD. Room air Heart: RRR, lidocaine  patch to L chest, healing midline incision Lungs: CTA anteriorly Abdomen: soft Extremities: 1+ BLE edema (in compression hose) Dialysis Access: TDC in R chest  Additional Objective Labs: Basic Metabolic Panel: Recent Labs  Lab 01/24/24 0911 01/26/24 1412 01/28/24 1057 01/29/24 0513  NA 128* 128* 132* 132*  K 4.0 4.3 4.4 4.4  CL 90* 92* 93* 95*  CO2 27 24 27 26   GLUCOSE 121* 137* 109* 107*  BUN 46* 63* 47* 56*  CREATININE 4.58* 6.72* 5.59* 6.52*  CALCIUM  8.4* 8.6* 8.9 8.7*  PHOS 4.1 4.2  --   --    Liver Function Tests: Recent Labs  Lab 01/24/24 0911 01/26/24 1412  ALBUMIN  3.2* 3.2*   CBC: Recent Labs  Lab 01/24/24 0911 01/25/24 0854 01/26/24 0609 01/27/24 0626 01/29/24 0513  WBC 10.5 10.5 10.9* 9.2 10.2  NEUTROABS 7.8* 7.5 7.4 6.2 6.6  HGB 7.1* 7.4* 8.2* 7.8* 7.3*  HCT 22.1* 22.4* 25.7* 24.4* 22.8*  MCV 94.0 90.7 92.8 92.8 93.8  PLT 411* 407* 418* 363 358   Medications:   aspirin  EC  81 mg Oral Daily   atorvastatin   80 mg Oral Daily   Chlorhexidine  Gluconate Cloth  6 each Topical Q12H   cholecalciferol   1,000 Units Oral Daily   clopidogrel   75 mg Oral Daily   collagenase    Topical Daily   darbepoetin (ARANESP ) injection - DIALYSIS  150 mcg Subcutaneous Q Thu-1800   Gerhardt's butt cream   Topical Daily   heparin   5,000 Units Subcutaneous Q12H   insulin  aspart  5 Units Subcutaneous TID WC   insulin  glargine  17 Units  Subcutaneous BID   lidocaine   2 patch Transdermal Q24H   magic mouthwash  5 mL Oral QID   midodrine   7.5 mg Oral TID WC   multivitamin  1 tablet Oral QHS   pantoprazole   40 mg Oral Daily   polyethylene glycol  17 g Oral Daily   senna-docusate  2 tablet Oral BID   sodium chloride  flush  10-40 mL Intracatheter Q12H   sucroferric oxyhydroxide  500 mg Oral TID WC   zolpidem   5 mg Oral QHS    Dialysis Orders MWF - [facility] 4h B350  133.5kg  TDC  Heparin  2000+ 1400u/hr Calcitriol 1.25 three times per week Mircera 90 mcg q 2 wks (last 12/1)  CXR 12/11-> resolved vasc congestion, improved IS edema   Assessment/Plan: ESRD - usual HD schedule MWF. CRRT used from 12/10 - 12/13 for BP stability in the face of stroke-like symptoms. Now back on HD per MWF schedule, for HD tomorrow. BP/volume: Now on midodrine  7.5mg  TID, still has some edema - UF as tolerated. Anemia of ESRD: Continue Aranesp  150mcg q Thursday, s/p 1U PRBCs on 12/24, Hgb trending down again. Check FOBT. Secondary HPTH: Ca/Phos ok - continue Velphoro  NSTEMI -  3V CAD by LHC -> s/p CABG 01/08/24 Stroke-like symptoms: was waxing/ waning per neuro. There was no acute CVA by imaging, but due to advanced carotid disease by CTA pt underwent L carotid artery stent per [facility] 12/10.    Timothy Boehringer, PA-C 01/30/2024, 4:50 PM  [facility]   Seen and examined independently.  Agree with note and exam as documented above by physician extender and as noted here.  Patient states discharge is planned for Saturday   General adult male in bed in no acute distress HEENT normocephalic atraumatic extraocular movements intact sclera anicteric Neck supple trachea midline Lungs clear to auscultation bilaterally normal work of breathing at rest on room air Heart S1S2 midline sternal incision  Abdomen soft nontender nondistended Extremities 1-2+ edema bilateral lower extremities Psych normal mood and affect Access right internal  jugular tunn catheter  ESRD - - on HD MWF  - renal panel in AM   Anemia of CKD - on ESA.  CBC in AM.   FOBT ordered, as well.    Hypotension  - on midodrine  which is now 7.5 mg TID after HTN earlier today  Timothy JAYSON Saba, MD 01/30/2024  6:38 PM   "

## 2024-01-30 NOTE — Progress Notes (Signed)
 "                                                        PROGRESS NOTE   Subjective/Complaints:  No new complaints or concerns. Pain is controlled. LBM last night.   ROS: +orthostasis, +dizziness- improved, fatigue improved Per HPI  Pt denies headache, SOB, abd pain, CP, N/V/C/D, and vision changes    Objective:   No results found.   Recent Labs    01/29/24 0513  WBC 10.2  HGB 7.3*  HCT 22.8*  PLT 358   Recent Labs    01/28/24 1057 01/29/24 0513  NA 132* 132*  K 4.4 4.4  CL 93* 95*  CO2 27 26  GLUCOSE 109* 107*  BUN 47* 56*  CREATININE 5.59* 6.52*  CALCIUM  8.9 8.7*     Intake/Output Summary (Last 24 hours) at 01/30/2024 1031 Last data filed at 01/30/2024 0751 Gross per 24 hour  Intake 480 ml  Output 3000 ml  Net -2520 ml     Wound 01/18/24 1604 Pressure Injury Sacrum Unstageable - Full thickness tissue loss in which the base of the injury is covered by slough (yellow, tan, gray, green or brown) and/or eschar (tan, brown or black) in the wound bed. (Active)    Physical Exam: Vital Signs Blood pressure (!) 154/54, pulse 66, temperature 97.9 F (36.6 C), resp. rate 18, height 6' (1.829 m), weight 133.9 kg, SpO2 97%.    General: awake, alert, appropriate, sitting up at EOB; NAD HENT: conjugate gaze; oropharynx moist CV: regular rate and rhythm; no JVD Pulmonary: CTA B/L; no W/R/R- slightly decreased at bases B/L GI: soft, NT, ND, (+)BS Psychiatric: appropriate, cooperative Neurological: alert and awake Skin- sternal incision-CDI Extremities: 1+ LE edema b/l LEs Neuro: Alert and oriented x3, decreased sensation in bilateral feet. 5/5 strength throughout  Assessment/Plan: 1. Functional deficits which require 3+ hours per day of interdisciplinary therapy in a comprehensive inpatient rehab setting. Physiatrist is providing close team supervision and 24 hour management of active medical problems listed below. Physiatrist and rehab team continue to  assess barriers to discharge/monitor patient progress toward functional and medical goals  Care Tool:  Bathing    Body parts bathed by patient: Chest, Left arm, Right arm, Abdomen, Front perineal area, Right upper leg, Left upper leg, Face   Body parts bathed by helper: Right lower leg, Left lower leg, Buttocks     Bathing assist Assist Level: Moderate Assistance - Patient 50 - 74%     Upper Body Dressing/Undressing Upper body dressing   What is the patient wearing?: Pull over shirt    Upper body assist Assist Level: Supervision/Verbal cueing    Lower Body Dressing/Undressing Lower body dressing      What is the patient wearing?: Pants     Lower body assist Assist for lower body dressing:  (Sup for donning for toileting with accident the pt was Mod to MaxA underwear and pant for threading leg through the opening)     Toileting Toileting    Toileting assist Assist for toileting: Maximal Assistance - Patient 25 - 49%     Transfers Chair/bed transfer  Transfers assist     Chair/bed transfer assist level: Supervision/Verbal cueing     Locomotion Ambulation   Ambulation assist      Assist level: Supervision/Verbal  cueing Assistive device: Rollator Max distance: 166   Walk 10 feet activity   Assist     Assist level: Supervision/Verbal cueing Assistive device: Rollator   Walk 50 feet activity   Assist Walk 50 feet with 2 turns activity did not occur: Safety/medical concerns (fatigue, SOB, dizziness, weakness/deconditioning)  Assist level: Supervision/Verbal cueing Assistive device: Rollator    Walk 150 feet activity   Assist Walk 150 feet activity did not occur: Safety/medical concerns (fatigue, SOB, dizziness, weakness/deconditioning)  Assist level: Contact Guard/Touching assist Assistive device: Rollator    Walk 10 feet on uneven surface  activity   Assist Walk 10 feet on uneven surfaces activity did not occur: Safety/medical concerns  (fatigue, SOB, dizziness, weakness/deconditioning)         Wheelchair     Assist Is the patient using a wheelchair?: Yes Type of Wheelchair: Manual    Wheelchair assist level: Dependent - Patient 0% Max wheelchair distance: 148ft    Wheelchair 50 feet with 2 turns activity    Assist        Assist Level: Dependent - Patient 0%   Wheelchair 150 feet activity     Assist      Assist Level: Dependent - Patient 0%   Blood pressure (!) 154/54, pulse 66, temperature 97.9 F (36.6 C), resp. rate 18, height 6' (1.829 m), weight 133.9 kg, SpO2 97%.  Medical Problem List and Plan: 1. 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  2. Pain: d/c oxycodone , continue tramadol  for moderate pain.              --Continue lidocaine  patch to chest wall for local measures  12/29 reports pain is under control continue current regimen, he occasionally uses tramadol   3. Therapy: TEDs/Binder as continues to have BP drop but asymptomatic per OT notes -increase midodrine  to 15mg  TID, continue this dose, discussed that dizziness has improved - 12/29 patient has had a couple elevated BP readings, we will decrease midodrine  back to 10 mg 3 times daily for hypertension -12/30 will decrease midodrine  to 7.5mg  TID    01/30/2024    6:04 AM 01/29/2024    9:01 PM 01/29/2024    6:39 PM  Vitals with BMI  Weight 295 lbs 3 oz    BMI 40.03    Systolic 154 149 830  Diastolic 54 47 45  Pulse 66 69 60     6. 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   -sternal incision assessed and is healing well  7.  T2DM: Hgb A1c- 9.9.  Monitor BS ac/hs, Add CM restrictions to diet. Used Lantus  50 units PTA  -decrease aspart to 6U with meals due to hypoglycemia. D/c ISS   CBG (last 3)  Recent Labs    01/29/24 1830 01/29/24 2103 01/30/24 0604  GLUCAP 76 190* 90   Will reduce lantus  to 22U, continue this dose  12/27- will reduce Lantus  to 20 units since dropped to  68 this AM- also attempted to order night snack, although required a comment to get it in the note  12/28- Pt's BG's looking better in last 24 hours- con't regimen-   -12/30 decrease lantus  to 17 units to avoid hypoglycemia  8. ESRD: Off CRRT and back on MWF HD schedule. Continue fluid restriction --schedule therapy at the end of the day to help with tolerance of therapy.  12/28- LE edema a little worse- weight UP 1 LB- will order ACE wraps- don't think TEDs would fit-  12/30 Pt had dialysis yesterday, reviewed nephrology note 9.  Acute on chronic anemia: Hgb reviewed and was 9 on 12/9. On Epo Reviewed stool occult- one was positive and one was negative, Hgb did not improve as much as expected with 1U PRBCs on 12/25- CT abdomen ordered and was negative for acute bleed. Hgb reviewed 12/26 and improved. Heparin  decreased to q12H  12/27- Hb 7.8- about his normal last 7 days or so- running 7.0-8.0  -on Aranesp  10. Leucocytosis: Has had leucocytosis which continues to fluctuate             --12.2 at admission    12/29 WBC 10.2 today    Latest Ref Rng & Units 01/29/2024    5:13 AM 01/27/2024    6:26 AM 01/26/2024    6:09 AM  CBC  WBC 4.0 - 10.5 K/uL 10.2  9.2  10.9   Hemoglobin 13.0 - 17.0 g/dL 7.3  7.8  8.2   Hematocrit 39.0 - 52.0 % 22.8  24.4  25.7   Platelets 150 - 400 K/uL 358  363  418      11. Constipation: Change dulcolax to senna S 2 bid. LBM 12/25- d/c suppository  12/27- LBM yesterday- type 6  12/29-hard BM documented yesterday, add MiraLAX   12/30 LBM yesterday, monitor  12.  Low protein stores: Albumin  reviewed and is improving, d/c  Juven  13. Odynophagia: continue MMW, messaged nursing to see if this is still an issue  14. Class 2 Obesity: BMI- 37.4 on 12/20 Educate on diet and exercise to promote overall health and mobility.   12/28- BMI up to 40.48 today- large jump considering his fluid status/ESRD- will d/w renal  12/28- per pt and nursing, sticking to 1200cc/day of fluid restriction- in spite of Na down to 128 15. Atelectasis: continue flutter valve.  16. Hyponatremia  12/28- Na 128- running 128- 136 in last 1 week- will d/w renal  12/28- renal felt pt might not be sticking to 1200cc/day- per staff and pt, he is- will recheck BMP today   LOS: 12 days A FACE TO FACE EVALUATION WAS PERFORMED  Timothy Holloway 01/30/2024, 10:31 AM     "

## 2024-01-30 NOTE — Progress Notes (Addendum)
 Patient ID: Timothy Holloway, male   DOB: 1955-05-16, 68 y.o.   MRN: [id number]  DME being held for co-pay. Patient/wife aware.

## 2024-01-30 NOTE — Progress Notes (Signed)
 Occupational Therapy Session Note  Patient Details  Name: Timothy Holloway MRN: [id number] Date of Birth: May 17, 1955  Today's Date: 01/30/2024 OT Individual Time: 8566-8454 OT Individual Time Calculation (min): 72 min    Short Term Goals: Week 2:  OT Short Term Goal 1 (Week 2): STG=LTG due to LOS  Skilled Therapeutic Interventions/Progress Updates:    Patient agreeable to participate in OT session. Reports no pain level.   Patient participated in skilled OT session focusing on LE strengthening, functional mobility, dynamic balance. Patient received in room.completed functional mobility to ADL apartment with rollator SUP, then  Completed IADL task of small meal gathering/ prep in ADL apartment with rollator with SUP. Patient then completed functional mobility to gym 40 ft with SUP. Completed 1 set of 5, 3 sets of 3 sit to stands with 4.4# ball. Completed functional mobility 2x80 ft around gym. Patient had complaints of LE fatigue following functional mobility. Patient then able to complete dynamic balance activity of ball toss on rebounder with light ball. Patient then able to complete toe taps on 6 inch step to increase dynamic balance. Patient requires increased tactile cues due to small losses of balance. Patient then returned to room all needs in reach alarm on via functional mobility Called patient. Advised patient of provider's approval for requested procedure, as well as any comments/instructions from provider. Patient required increased rest breaks due to muscular fatigue and decreased activity tolerance.     Therapy Documentation Precautions:  Precautions Precautions: Fall, Sternal Recall of Precautions/Restrictions: Impaired Precaution/Restrictions Comments: watch BP Restrictions Weight Bearing Restrictions Per Provider Order: No Other Position/Activity Restrictions: Sternal precautions  Therapy/Group: Individual Therapy  D'mariea L Verdie Barrows 01/30/2024, 12:56 PM

## 2024-01-31 ENCOUNTER — Encounter (HOSPITAL_COMMUNITY): Payer: Self-pay

## 2024-01-31 ENCOUNTER — Ambulatory Visit: Admitting: Student

## 2024-01-31 ENCOUNTER — Other Ambulatory Visit (HOSPITAL_COMMUNITY): Payer: Self-pay

## 2024-01-31 DIAGNOSIS — D649 Anemia, unspecified: Secondary | ICD-10-CM

## 2024-01-31 LAB — GLUCOSE, CAPILLARY
Glucose-Capillary: 64 mg/dL — ABNORMAL LOW (ref 70–99)
Glucose-Capillary: 70 mg/dL (ref 70–99)
Glucose-Capillary: 99 mg/dL (ref 70–99)
Glucose-Capillary: 93 mg/dL (ref 70–99)

## 2024-01-31 LAB — RENAL FUNCTION PANEL
Albumin: 3.4 g/dL — ABNORMAL LOW (ref 3.5–5.0)
Anion gap: 11 (ref 5–15)
BUN: 49 mg/dL — ABNORMAL HIGH (ref 8–23)
CO2: 27 mmol/L (ref 22–32)
Calcium: 9.1 mg/dL (ref 8.9–10.3)
Chloride: 94 mmol/L — ABNORMAL LOW (ref 98–111)
Creatinine, Ser: 5.75 mg/dL — ABNORMAL HIGH (ref 0.61–1.24)
GFR, Estimated: 10 mL/min — ABNORMAL LOW
Glucose, Bld: 67 mg/dL — ABNORMAL LOW (ref 70–99)
Phosphorus: 3.9 mg/dL (ref 2.5–4.6)
Potassium: 4.5 mmol/L (ref 3.5–5.1)
Sodium: 132 mmol/L — ABNORMAL LOW (ref 135–145)

## 2024-01-31 LAB — CBC
HCT: 24.6 % — ABNORMAL LOW (ref 39.0–52.0)
Hemoglobin: 7.9 g/dL — ABNORMAL LOW (ref 13.0–17.0)
MCH: 29.7 pg (ref 26.0–34.0)
MCHC: 32.1 g/dL (ref 30.0–36.0)
MCV: 92.5 fL (ref 80.0–100.0)
Platelets: 330 K/uL (ref 150–400)
RBC: 2.66 MIL/uL — ABNORMAL LOW (ref 4.22–5.81)
RDW: 15.8 % — ABNORMAL HIGH (ref 11.5–15.5)
WBC: 9.9 K/uL (ref 4.0–10.5)
nRBC: 0 % (ref 0.0–0.2)

## 2024-01-31 MED ORDER — COLLAGENASE 250 UNIT/GM EX OINT
TOPICAL_OINTMENT | Freq: Every day | CUTANEOUS | 0 refills | Status: AC
  Filled 2024-01-31: qty 30, 30d supply, fill #0

## 2024-01-31 MED ORDER — VITAMIN D3 25 MCG PO TABS: 1000.0000 [IU] | ORAL_TABLET | Freq: Every day | ORAL | Status: AC

## 2024-01-31 MED ORDER — LIDOCAINE 5 % EX PTCH
2.0000 | MEDICATED_PATCH | CUTANEOUS | 0 refills | Status: AC
  Filled 2024-01-31: qty 60, 30d supply, fill #0

## 2024-01-31 MED ORDER — ATORVASTATIN CALCIUM 80 MG PO TABS
80.0000 mg | ORAL_TABLET | Freq: Every day | ORAL | 0 refills | Status: AC
  Filled 2024-01-31: qty 30, 30d supply, fill #0

## 2024-01-31 MED ORDER — HEPARIN SODIUM (PORCINE) 1000 UNIT/ML IJ SOLN
INTRAMUSCULAR | Status: AC
  Filled 2024-01-31: qty 4

## 2024-01-31 MED ORDER — INSULIN GLARGINE 100 UNIT/ML ~~LOC~~ SOLN
13.0000 [IU] | Freq: Two times a day (BID) | SUBCUTANEOUS | Status: DC
  Administered 2024-01-31 – 2024-02-01 (×2): 13 [IU] via SUBCUTANEOUS
  Filled 2024-01-31 (×3): qty 0.13

## 2024-01-31 MED ORDER — ASPIRIN 81 MG PO TBEC
81.0000 mg | DELAYED_RELEASE_TABLET | Freq: Every day | ORAL | 0 refills | Status: AC
  Filled 2024-01-31: qty 100, 100d supply, fill #0

## 2024-01-31 MED ORDER — ACETAMINOPHEN 325 MG PO TABS: 325.0000 mg | ORAL_TABLET | ORAL | Status: AC | PRN

## 2024-01-31 MED ORDER — MIDODRINE HCL 5 MG PO TABS
5.0000 mg | ORAL_TABLET | Freq: Three times a day (TID) | ORAL | 0 refills | Status: DC
  Filled 2024-01-31: qty 90, 30d supply, fill #0

## 2024-01-31 MED ORDER — TRAMADOL HCL 50 MG PO TABS
50.0000 mg | ORAL_TABLET | Freq: Two times a day (BID) | ORAL | 0 refills | Status: AC | PRN
  Filled 2024-01-31: qty 14, 7d supply, fill #0

## 2024-01-31 MED ORDER — POLYETHYLENE GLYCOL 3350 17 G PO PACK
17.0000 g | PACK | Freq: Two times a day (BID) | ORAL | Status: DC
  Administered 2024-01-31: 17 g via ORAL
  Filled 2024-01-31 (×5): qty 1

## 2024-01-31 MED ORDER — CLOPIDOGREL BISULFATE 75 MG PO TABS
75.0000 mg | ORAL_TABLET | Freq: Every day | ORAL | 0 refills | Status: AC
  Filled 2024-01-31: qty 30, 30d supply, fill #0

## 2024-01-31 MED ORDER — MIDODRINE HCL 5 MG PO TABS
5.0000 mg | ORAL_TABLET | Freq: Three times a day (TID) | ORAL | Status: DC
  Administered 2024-01-31 – 2024-02-02 (×5): 5 mg via ORAL
  Filled 2024-01-31 (×8): qty 1

## 2024-01-31 MED ORDER — MAGIC MOUTHWASH
5.0000 mL | Freq: Four times a day (QID) | ORAL | 0 refills | Status: AC
  Filled 2024-01-31: qty 120, 6d supply, fill #0

## 2024-01-31 MED ORDER — SUCROFERRIC OXYHYDROXIDE 500 MG PO CHEW
500.0000 mg | CHEWABLE_TABLET | Freq: Three times a day (TID) | ORAL | 0 refills | Status: AC
  Filled 2024-01-31: qty 90, 30d supply, fill #0

## 2024-01-31 MED ORDER — GERHARDT'S BUTT CREAM: 1.0000 | TOPICAL_CREAM | Freq: Every day | CUTANEOUS | Status: AC

## 2024-01-31 MED ORDER — SALINE SPRAY 0.65 % NA SOLN: 1.0000 | Freq: Every day | NASAL | Status: AC | PRN

## 2024-01-31 MED ORDER — SENNOSIDES-DOCUSATE SODIUM 8.6-50 MG PO TABS
2.0000 | ORAL_TABLET | Freq: Two times a day (BID) | ORAL | 0 refills | Status: AC
  Filled 2024-01-31: qty 120, 30d supply, fill #0

## 2024-01-31 MED ORDER — PANTOPRAZOLE SODIUM 40 MG PO TBEC
40.0000 mg | DELAYED_RELEASE_TABLET | Freq: Every day | ORAL | 0 refills | Status: AC
  Filled 2024-01-31: qty 30, 30d supply, fill #0

## 2024-01-31 MED ORDER — POLYETHYLENE GLYCOL 3350 17 GM/SCOOP PO POWD
17.0000 g | Freq: Two times a day (BID) | ORAL | 0 refills | Status: AC
  Filled 2024-01-31: qty 954, 28d supply, fill #0

## 2024-01-31 NOTE — Progress Notes (Addendum)
 " [facility] Progress Note   Subjective:    Seen and examined on dialysis.  Procedure supervised.  Blood pressure 148/50 and HR 65.  Tolerating goal.  RIJ tunn catheter in use. Feels ok.     Objective Vitals:   01/31/24 1339 01/31/24 1501 01/31/24 1509 01/31/24 1515  BP: (!) 139/50 (!) 153/45 (!) 153/65 (!) 150/47  Pulse: 66 60 62 60  Resp: 15 15 15 17   Temp: 98 F (36.7 C) 97.9 F (36.6 C)    TempSrc: Oral Oral    SpO2: 97% 90% 97% 100%  Weight:      Height:       Physical Exam General adult male in bed in no acute distress HEENT normocephalic atraumatic extraocular movements intact sclera anicteric Neck supple trachea midline Lungs clear to auscultation bilaterally normal work of breathing at rest on room air Heart S1S2 midline incision healing; lidocaine  patch on chest Abdomen soft nontender obese habitus/distended Extremities 2+ edema compression hose on  Psych normal mood and affect Dialysis Access: [facility] in R chest  Additional Objective Labs: Basic Metabolic Panel: Recent Labs  Lab 01/26/24 1412 01/28/24 1057 01/29/24 0513 01/31/24 0532  NA 128* 132* 132* 132*  K 4.3 4.4 4.4 4.5  CL 92* 93* 95* 94*  CO2 24 27 26 27   GLUCOSE 137* 109* 107* 67*  BUN 63* 47* 56* 49*  CREATININE 6.72* 5.59* 6.52* 5.75*  CALCIUM  8.6* 8.9 8.7* 9.1  PHOS 4.2  --   --  3.9   Liver Function Tests: Recent Labs  Lab 01/26/24 1412 01/31/24 0532  ALBUMIN  3.2* 3.4*   CBC: Recent Labs  Lab 01/25/24 0854 01/26/24 0609 01/27/24 0626 01/29/24 0513 01/31/24 0532  WBC 10.5 10.9* 9.2 10.2 9.9  NEUTROABS 7.5 7.4 6.2 6.6  --   HGB 7.4* 8.2* 7.8* 7.3* 7.9*  HCT 22.4* 25.7* 24.4* 22.8* 24.6*  MCV 90.7 92.8 92.8 93.8 92.5  PLT 407* 418* 363 358 330   Medications:   aspirin  EC  81 mg Oral Daily   atorvastatin   80 mg Oral Daily   Chlorhexidine  Gluconate Cloth  6 each Topical Q12H   cholecalciferol   1,000 Units Oral Daily   clopidogrel   75 mg Oral Daily    collagenase    Topical Daily   darbepoetin (ARANESP ) injection - DIALYSIS  150 mcg Subcutaneous Q Thu-1800   Gerhardt's butt cream   Topical Daily   heparin   5,000 Units Subcutaneous Q12H   insulin  aspart  5 Units Subcutaneous TID WC   insulin  glargine  13 Units Subcutaneous BID   lidocaine   2 patch Transdermal Q24H   magic mouthwash  5 mL Oral QID   midodrine   5 mg Oral TID WC   multivitamin  1 tablet Oral QHS   pantoprazole   40 mg Oral Daily   polyethylene glycol  17 g Oral BID   senna-docusate  2 tablet Oral BID   sodium chloride  flush  10-40 mL Intracatheter Q12H   sucroferric oxyhydroxide  500 mg Oral TID WC   zolpidem   5 mg Oral QHS    Dialysis Orders MWF - [facility] 4h B350  133.5kg  TDC  Heparin  2000+ 1400u/hr Calcitriol 1.25 three times per week Mircera 90 mcg q 2 wks (last 12/1)  CXR 12/11-> resolved vasc congestion, improved IS edema   Assessment/Plan: ESRD - usual HD schedule MWF. CRRT used from 12/10 - 12/13 for BP stability in the face of stroke-like symptoms. Now back on HD per MWF  schedule BP/volume: midodrine  was titrated to 5mg  TID, still has some edema - UF as tolerated. Anemia of ESRD: Continue Aranesp  150mcg q Thursday, s/p 1U PRBCs on 12/24, Hgb trending down again. Check FOBT (not yet done). Secondary HPTH: Ca/Phos ok - continue Velphoro  NSTEMI - 3V CAD by LHC -> s/p CABG 01/08/24 Stroke-like symptoms: was waxing/ waning per neuro. There was no acute CVA by imaging, but due to advanced carotid disease by CTA pt underwent L carotid artery stent per [facility] 12/10.   Patient states discharge is planned for Saturday   Katheryn JAYSON Saba, MD 01/31/2024, 3:51 PM [facility]  Addendum:  Adjusted HD schedule above to correct error - MWF schedule  "

## 2024-01-31 NOTE — Progress Notes (Signed)
 Patient ID: Timothy Holloway, male   DOB: 1956/01/23, 68 y.o.   MRN: [id number]  Have reviewed team conference with pt and family. Both aware and agreeable with targeted d/c date of 1/02 and goals of Independent with assistive device.   DME ordered: Rollator and TTB (oop)

## 2024-01-31 NOTE — Progress Notes (Signed)
 Physical Therapy Session Note  Patient Details  Name: Timothy Holloway MRN: [id number] Date of Birth: [birth year]  Today's Date: 01/31/2024 PT Individual Time: 0916-0956 PT Individual Time Calculation (min): 40 min   Short Term Goals: Week 2:  PT Short Term Goal 1 (Week 2): STG= LTG  Skilled Therapeutic Interventions/Progress Updates:      Pt resting in bed to start - agreeable to therapy treatment. He has no reports of pain. Pt now wearing knee high TED's only for BP support.   Bed mobility completed mod I. Sit<>stand to rollator with supervision assist - pt locking brakes appropriately before standing.   Pt ambulated with supervision assist from his room to main gym, ~125'. Pt without LOB or knee buckling, reporting his legs feel weak. Pt also reporting 6/10 RPE for fatigue level. Seated rest break provided for recovery.   Pt practiced curb transfers using 4 curb with rollator. Pt needing minA for rollator management to ascent the 4 curb and CGA for safely descending the curb. Pt reports his wife has been trained on this and his wife will be providing the assist needed for rollator management while navigating the threshold into his home. Pt completed curb transfer x4 reps.   with rollator: -464ft total.  -seated rest break needed at 3 minute mark for 45 seconds after 240'. -able to finish test without an additional seated rest break -gait speed 0.49m/s, indicative of limited community ambulator -Pt reporting 9/10 on RPE scale for fatigue level, needing a several minute rest break for recovery.   Pt returned to his room at completion of session, ambulating an additional 125' with supervision and rolltaor. Ended session in bed, completing bed mobility mod I. Left with needs met, pt aware of x1 more PT session before afternoon dialysis.     Therapy Documentation Precautions:  Precautions Precautions: Fall, Sternal Recall of Precautions/Restrictions:  Impaired Precaution/Restrictions Comments: watch BP Restrictions Weight Bearing Restrictions Per Provider Order: No Other Position/Activity Restrictions: Sternal precautions General:       Therapy/Group: Individual Therapy  Sherlean SHAUNNA Perks 01/31/2024, 7:41 AM

## 2024-01-31 NOTE — Progress Notes (Signed)
 Occupational Therapy Note  Patient Details  Name: Timothy Holloway MRN: [id number] Date of Birth: March 14, 1955   Occupational Therapist participated in the interdisciplinary team conference, providing clinical information regarding the patient's current status, treatment goals, and weekly focus, including any barriers that need to be addressed. Please see the Inpatient Rehabilitation Team Conference and Plan of Care Update for further details.    Marybelle Giraldo M 01/31/2024, 11:10 AM

## 2024-01-31 NOTE — Plan of Care (Signed)
" °  Problem: Education: Goal: Ability to describe self-care measures that may prevent or decrease complications (Diabetes Survival Skills Education) will improve Outcome: Progressing Goal: Individualized Educational Video(s) Outcome: Progressing   Problem: Coping: Goal: Ability to adjust to condition or change in health will improve Outcome: Progressing   Problem: Fluid Volume: Goal: Ability to maintain a balanced intake and output will improve Outcome: Progressing   Problem: Health Behavior/Discharge Planning: Goal: Ability to identify and utilize available resources and services will improve Outcome: Progressing Goal: Ability to manage health-related needs will improve Outcome: Progressing   Problem: Metabolic: Goal: Ability to maintain appropriate glucose levels will improve Outcome: Progressing   Problem: Nutritional: Goal: Maintenance of adequate nutrition will improve Outcome: Progressing Goal: Progress toward achieving an optimal weight will improve Outcome: Progressing   Problem: Skin Integrity: Goal: Risk for impaired skin integrity will decrease Outcome: Progressing   Problem: Tissue Perfusion: Goal: Adequacy of tissue perfusion will improve Outcome: Progressing   Problem: Consults Goal: RH STROKE PATIENT EDUCATION Description: See Patient Education module for education specifics  Outcome: Progressing Goal: Nutrition Consult-if indicated Outcome: Progressing Goal: Diabetes Guidelines if Diabetic/Glucose > 140 Description: If diabetic or lab glucose is > 140 mg/dl - Initiate Diabetes/Hyperglycemia Guidelines & Document Interventions  Outcome: Progressing   Problem: RH BOWEL ELIMINATION Goal: RH STG MANAGE BOWEL WITH ASSISTANCE Description: STG Manage Bowel with Assistance. Outcome: Progressing Goal: RH STG MANAGE BOWEL W/MEDICATION W/ASSISTANCE Description: STG Manage Bowel with Medication with Assistance. Outcome: Progressing   Problem: RH SAFETY Goal:  RH STG ADHERE TO SAFETY PRECAUTIONS W/ASSISTANCE/DEVICE Description: STG Adhere to Safety Precautions With Assistance/Device. Outcome: Progressing   Problem: RH PAIN MANAGEMENT Goal: RH STG PAIN MANAGED AT OR BELOW PT'S PAIN GOAL Outcome: Progressing   Problem: RH KNOWLEDGE DEFICIT Goal: RH STG INCREASE KNOWLEDGE OF DIABETES Outcome: Progressing Goal: RH STG INCREASE KNOWLEDGE OF HYPERTENSION Outcome: Progressing Goal: RH STG INCREASE KNOWLEGDE OF HYPERLIPIDEMIA Outcome: Progressing Goal: RH STG INCREASE KNOWLEDGE OF STROKE PROPHYLAXIS Outcome: Progressing   "

## 2024-01-31 NOTE — Progress Notes (Signed)
 Physical Therapy Session Note  Patient Details  Name: Timothy Holloway MRN: [id number] Date of Birth: [birth year]  Today's Date: 01/31/2024 PT Individual Time: 1102-1200 PT Individual Time Calculation (min): 58 min   Short Term Goals: Week 2:  PT Short Term Goal 1 (Week 2): STG= LTG  Skilled Therapeutic Interventions/Progress Updates: Pt presents sitting EOB and agreeable to therapy although fatigued from prior therapies.  Pt transfers sit ti stand w/ mod I.  Pt amb x 152' w/ rollator and sup/mod I, cues for posture.  Pt requires extended seated rest breaks 2/2 fatigue and increased RR.  Pt has no c/o dizziness w/ any activity.  Pt demo's HR of 71 and O2 sats decreased to 89 but quickly improved to > 93% w/ PLB.  Pt amb again of 150' to small gym and sitting on Nu-step.  Pt performed Nu-step at Level 1 x 4 and after seated rest break of 2', completed second trial of 4' for total steps of 383, averaged 42 spm and 1.4 METS.  Pt amb 150' and 152' before returning to room.  Ed given for energy conservation and monitoring of fatigue level.  Pt sitting EOB w/ all needs in reach and bed alarm on.     Therapy Documentation Precautions:  Precautions Precautions: Fall, Sternal Recall of Precautions/Restrictions: Impaired Precaution/Restrictions Comments: watch BP Restrictions Weight Bearing Restrictions Per Provider Order: No Other Position/Activity Restrictions: Sternal precautions General:   Vital Signs:   Pain:0/10 Pain Assessment Pain Scale: 0-10 Pain Score: 0-No pain    Therapy/Group: Individual Therapy  Barnett Elzey P [place]inana 01/31/2024, 12:04 PM

## 2024-01-31 NOTE — Progress Notes (Signed)
" °   01/31/24 1842  Vitals  Temp 98.3 F (36.8 C)  BP Location Left Arm  BP Method Automatic  Patient Position (if appropriate) Lying  Pulse Rate 62  ECG Heart Rate 62  Resp 14  Weight 130.6 kg  Type of Weight Post-Dialysis  Oxygen Therapy  SpO2 98 %  O2 Device Nasal Cannula  O2 Flow Rate (L/min) 2 L/min  Pulse Oximetry Type Continuous  Oximetry Probe Site Changed No  During Treatment Monitoring  Blood Flow Rate (mL/min) 400 mL/min  Arterial Pressure (mmHg) -172.52 mmHg  Venous Pressure (mmHg) 170.09 mmHg  TMP (mmHg) 14.75 mmHg  Ultrafiltration Rate (mL/min) 1015 mL/min  Dialysate Flow Rate (mL/min) 299 ml/min  Duration of HD Treatment -hour(s) 3.44 hour(s)  Cumulative Fluid Removed (mL) per Treatment  2934.83  HD Safety Checks Performed Yes  Intra-Hemodialysis Comments Tolerated well;Tx completed  Post Treatment  Dialyzer Clearance Clear  Liters Processed 83.9  Fluid Removed (mL) 3000 mL  Tolerated HD Treatment Yes  Hemodialysis Catheter Right Internal jugular Double lumen Permanent (Tunneled)  Placement Date/Time: 10/23/23 0931   Placed prior to admission: No  Serial / Lot #: [id number]  Expiration Date: 06/30/28  Time Out: Correct patient;Correct site;Correct procedure  Maximum sterile barrier precautions: Hand hygiene;Large sterile sheet;C...  Site Condition No complications  Blue Lumen Status Heparin  locked;Antimicrobial dead end cap  Red Lumen Status Heparin  locked;Antimicrobial dead end cap  Catheter fill solution Heparin  1000 units/ml  Catheter fill volume (Arterial) 1.6 cc  Catheter fill volume (Venous) 1.6  Dressing Type Transparent  Dressing Status Antimicrobial disc/dressing in place;Clean, Dry, Intact  Drainage Description None  Dressing Change Due 03/07/24  Post treatment catheter status Capped and Clamped    "

## 2024-01-31 NOTE — Progress Notes (Signed)
 "                                                        PROGRESS NOTE   Subjective/Complaints:  No new complaints this AM. LBM was about 2 days ago. Pain controlled.   ROS: +orthostasis, +dizziness- improved, fatigue improved Per HPI  Pt denies chills, fever, SOB, abd pain, CP, N/V/D, and vision changes    Objective:   No results found.   Recent Labs    01/29/24 0513 01/31/24 0532  WBC 10.2 9.9  HGB 7.3* 7.9*  HCT 22.8* 24.6*  PLT 358 330   Recent Labs    01/29/24 0513 01/31/24 0532  NA 132* 132*  K 4.4 4.5  CL 95* 94*  CO2 26 27  GLUCOSE 107* 67*  BUN 56* 49*  CREATININE 6.52* 5.75*  CALCIUM  8.7* 9.1     Intake/Output Summary (Last 24 hours) at 01/31/2024 1103 Last data filed at 01/31/2024 0700 Gross per 24 hour  Intake 1213 ml  Output --  Net 1213 ml     Wound 01/18/24 1604 Pressure Injury Sacrum Unstageable - Full thickness tissue loss in which the base of the injury is covered by slough (yellow, tan, gray, green or brown) and/or eschar (tan, brown or black) in the wound bed. (Active)    Physical Exam: Vital Signs Blood pressure (!) 150/67, pulse 66, temperature 97.6 F (36.4 C), resp. rate 18, height 6' (1.829 m), weight 133.1 kg, SpO2 100%.    General: awake, alert, appropriate, sitting in WC-appears comfortable; NAD HENT: conjugate gaze; oropharynx moist CV: regular rate and rhythm; no JVD Pulmonary: CTA B/L; non-labored GI: soft, NT, ND, (+)BS Psychiatric: appropriate, cooperative Neurological: alert and awake Skin- sternal incision-CDI Extremities: 1+ LE edema b/l LEs Neuro: Alert and oriented x3, decreased sensation in bilateral feet. 5/5 strength throughout  Assessment/Plan: 1. Functional deficits which require 3+ hours per day of interdisciplinary therapy in a comprehensive inpatient rehab setting. Physiatrist is providing close team supervision and 24 hour management of active medical problems listed below. Physiatrist and rehab  team continue to assess barriers to discharge/monitor patient progress toward functional and medical goals  Care Tool:  Bathing    Body parts bathed by patient: Chest, Left arm, Right arm, Abdomen, Front perineal area, Right upper leg, Left upper leg, Face, Buttocks, Right lower leg, Left lower leg   Body parts bathed by helper: Right lower leg, Left lower leg, Buttocks     Bathing assist Assist Level: Supervision/Verbal cueing     Upper Body Dressing/Undressing Upper body dressing   What is the patient wearing?: Pull over shirt    Upper body assist Assist Level: Set up assist    Lower Body Dressing/Undressing Lower body dressing      What is the patient wearing?: Pants, Underwear/pull up     Lower body assist Assist for lower body dressing: Contact Guard/Touching assist     Toileting Toileting    Toileting assist Assist for toileting: Independent with assistive device     Transfers Chair/bed transfer  Transfers assist     Chair/bed transfer assist level: Independent with assistive device     Locomotion Ambulation   Ambulation assist      Assist level: Supervision/Verbal cueing Assistive device: Rollator Max distance: 120'   Walk 10 feet activity  Assist     Assist level: Supervision/Verbal cueing Assistive device: Rollator   Walk 50 feet activity   Assist Walk 50 feet with 2 turns activity did not occur: Safety/medical concerns (fatigue, SOB, dizziness, weakness/deconditioning)  Assist level: Supervision/Verbal cueing Assistive device: Rollator    Walk 150 feet activity   Assist Walk 150 feet activity did not occur: Safety/medical concerns (fatigue, SOB, dizziness, weakness/deconditioning)  Assist level: Contact Guard/Touching assist Assistive device: Rollator    Walk 10 feet on uneven surface  activity   Assist Walk 10 feet on uneven surfaces activity did not occur: Safety/medical concerns (fatigue, SOB, dizziness,  weakness/deconditioning)         Wheelchair     Assist Is the patient using a wheelchair?: Yes Type of Wheelchair: Manual    Wheelchair assist level: Dependent - Patient 0% Max wheelchair distance: 11ft    Wheelchair 50 feet with 2 turns activity    Assist        Assist Level: Dependent - Patient 0%   Wheelchair 150 feet activity     Assist      Assist Level: Dependent - Patient 0%   Blood pressure (!) 150/67, pulse 66, temperature 97.6 F (36.4 C), resp. rate 18, height 6' (1.829 m), weight 133.1 kg, SpO2 100%.  Medical Problem List and Plan: 1. 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  -Team conference today please see physician documentation under team conference tab, met with team  to discuss problems,progress, and goals. Formulized individual treatment plan based on medical history, underlying problem and comorbidities.    2. Pain: d/c oxycodone , continue tramadol  for moderate pain.              --Continue lidocaine  patch to chest wall for local measures  12/29 reports pain is under control continue current regimen, he occasionally uses tramadol   3. Therapy: TEDs/Binder as continues to have BP drop but asymptomatic per OT notes -increase midodrine  to 15mg  TID, continue this dose, discussed that dizziness has improved - 12/29 patient has had a couple elevated BP readings, we will decrease midodrine  back to 10 mg 3 times daily for hypertension -12/30 will decrease midodrine  to 7.5mg  TID -12/31 decrease midodrine  to 5 mg 3 times daily    01/31/2024    5:42 AM 01/31/2024    4:19 AM 01/30/2024    8:33 PM  Vitals with BMI  Weight  293 lbs 7  oz   BMI  39.79   Systolic 150  150  Diastolic 67  50  Pulse 66  65     6. 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   -sternal incision assessed and is healing well  7.  T2DM: Hgb A1c- 9.9. Monitor BS ac/hs, Add CM restrictions to diet. Used Lantus  50 units PTA  -decrease aspart to 6U with meals due to hypoglycemia. D/c ISS   CBG (last 3)  Recent Labs    01/30/24 2034 01/31/24  0543 01/31/24 0602  GLUCAP 120* 64* 70   Will reduce lantus  to 22U, continue this dose  12/27- will reduce Lantus  to 20 units since dropped to 68 this AM- also attempted to order night snack, although required a comment to get it in the note  12/28- Pt's BG's looking better in last 24 hours- con't regimen-   -12/30 decrease lantus  to 17 units to avoid hypoglycemia  -12/31 Decrease lantus  to 13 units due to hypoglycemia  8. ESRD: Off CRRT and back on MWF HD schedule. Continue fluid restriction --schedule therapy at the end of the day to help with tolerance of therapy.  12/28- LE edema a little worse- weight UP 1 LB- will order ACE wraps- don't think TEDs would fit-  12/31 reviewed nephrology note 9.  Acute on chronic anemia: Hgb reviewed and was 9 on 12/9. On Epo Reviewed stool occult- one was positive and one was negative, Hgb did not improve as much as expected with 1U PRBCs on 12/25- CT abdomen ordered and was negative for acute bleed. Hgb reviewed 12/26 and improved. Heparin  decreased to q12H  12/27- Hb 7.8- about his normal last 7 days or so- running 7.0-8.0  -on Aranesp   FOBT ordered by nephrology-pending, will send message to nursing to check next BM 10. Leucocytosis: Has had leucocytosis which continues to fluctuate             --12.2 at admission    12/31 stable at 9.9 WBC    Latest Ref Rng & Units 01/31/2024    5:32 AM 01/29/2024    5:13 AM 01/27/2024    6:26 AM  CBC  WBC 4.0 - 10.5 K/uL 9.9  10.2  9.2   Hemoglobin 13.0 -  17.0 g/dL 7.9  7.3  7.8   Hematocrit 39.0 - 52.0 % 24.6  22.8  24.4   Platelets 150 - 400 K/uL 330  358  363      11. Constipation: Change dulcolax to senna S 2 bid. LBM 12/25- d/c suppository  12/27- LBM yesterday- type 6  12/29-hard BM documented yesterday, add MiraLAX   12/30 LBM yesterday, monitor   12/31 increase miralax  to BID 12.  Low protein stores: Albumin  reviewed and is improving, d/c Juven  13. Odynophagia: continue MMW, messaged nursing to see if this is still an issue  14. Class 2 Obesity: BMI- 37.4 on 12/20 Educate on diet and exercise to promote overall health and mobility.   12/28- BMI up to 40.48 today- large jump considering his fluid status/ESRD- will d/w renal  12/28- per pt and nursing, sticking to 1200cc/day of fluid restriction- in spite of Na down to 128 15. Atelectasis: continue flutter valve.  16. Hyponatremia  12/28- Na 128- running 128- 136 in last 1 week- will d/w renal  12/28- renal felt pt might not be sticking to 1200cc/day- per staff and pt, he is- will recheck BMP today   LOS: 13 days A FACE TO FACE EVALUATION WAS PERFORMED  Timothy Holloway 01/31/2024, 11:03 AM     "

## 2024-01-31 NOTE — Patient Care Conference (Addendum)
 Inpatient RehabilitationTeam Conference and Plan of Care Update Date: 01/31/2024   Time: 11:07 AM    Patient Name: Timothy Holloway      Medical Record Number: [id number]  Date of Birth: [birth year] Sex: Male         Room/Bed: 4M02C/4M02C-01 Payor Info: Payor: [employer] / Plan: [employer] PPO / Product Type: *No Product type* /    Admit Date/Time:  01/18/2024  4:05 PM  Primary Diagnosis:  Embolic stroke [facility])  Hospital Problems: Principal Problem:   Embolic stroke [facility])    Expected Discharge Date: Expected Discharge Date: 02/02/24  Team Members Present: Physician leading conference: Dr. Murray Collier Social Worker Present: Waverly Gentry, LCSW-A Nurse Present: Barnie Ronde, RN PT Present: Sherlean Perks, PT OT Present: Monica Peacock, OT SLP Present: Rosina Downy, SLP     Current Status/Progress Goal Weekly Team Focus  Bowel/Bladder   Pt is continent of B&B with occassional episodes of incontinence d/t urgency   Pt will remain continent of B&B   Follow bladder program and offer pt tolieting every 4 hours to maintain continent status    Swallow/Nutrition/ Hydration               ADL's   Contact guard lower body with exception of compression hose - approaching goal   Mod I   Endurance, discharge planning    Mobility   Mod I bed mobility, supervision transfers, supervision gait 150' with rollator, car transfer supervision, supervision for static standing balance, CGA for dynamic standing balance   mod I  DC planning, standing balance, general strengthening, building activity tolerance    Communication                Safety/Cognition/ Behavioral Observations               Pain   Pt c/o intermittent pain to ABD   Pt pain will be less than 3 on the pain scale   Pt will be assessed for pain Q shift and medicated with pr n analgesic to ensure pain is less than 3 on the pain scale    Skin   Pt skin has a healing surgical incision  to his chest   Pt skin will remain intact and surgical incision site will be clean and free of any signs of infection  Pt will turn and reposition for comfort, positioning and skin integrity. Surgical incision will be free of any signs of infection      Discharge Planning:  Plans to discharge home with wife providing support and care. Lives close to [place] and [place]. Therapy recommending OP follow-up. DME ordered: Rollator, TTB.   Team Discussion: Patient admitted post CVA with soft BP and dizziness (calciphylaxis) as not correlated with fluctuation in BP and poor endurance. Pain is managed. Progress limited by orthostasis; MD adjusting midodrine  dosage and monitoring Hgb.; occult blood checking with general weakness and debility.  Patient on target to meet rehab goals: yes, currently needs CGA for lower body care. Ambulates with supervision; 6 min walk test ~400' with seated rest breaks due to fatigue. Goals for discharge set for mod I assist.  *See Care Plan and progress notes for long and short-term goals.   Revisions to Treatment Plan:  N/A   Teaching Needs: Safety, medications, transfers, toileting, skin care/wound care, etc.   Current Barriers to Discharge: Decreased caregiver support, Home enviroment access/layout, and Hemodialysis  Possible Resolutions to Barriers: Family education OP follow up services DME TTB and RW  Medical Summary Current Status: CVA after CABG, anemia, ESRD on HD, anemia, hypotension,constipation, DM2  Barriers to Discharge: Electrolyte abnormality;Medical stability;Self-care education  Barriers to Discharge Comments: CVA after CABG, anemia, ESRD on HD, anemia, hypotension,constipation, DM2 Possible Resolutions to [employer] Focus: decreased insulin , monitor HGB, monitor BP and adjust midodrine    Continued Need for Acute Rehabilitation Level of Care: The patient requires daily medical management by a physician with specialized training  in physical medicine and rehabilitation for the following reasons: Direction of a multidisciplinary physical rehabilitation program to maximize functional independence : Yes Medical management of patient stability for increased activity during participation in an intensive rehabilitation regime.: Yes Analysis of laboratory values and/or radiology reports with any subsequent need for medication adjustment and/or medical intervention. : Yes   I attest that I was present, lead the team conference, and concur with the assessment and plan of the team.   Fredericka Sober B 01/31/2024, 1:19 PM

## 2024-01-31 NOTE — Discharge Summary (Addendum)
 Physician Discharge Summary  Patient ID: Timothy Holloway MRN: [id number] DOB/AGE: February 15, 1955 68 y.o.  Admit date: 01/18/2024 Discharge date: 02/03/2024  Discharge Diagnoses:  Principal Problem:   Embolic stroke [facility]) Active Problems:   Diabetes mellitus (HCC)   Anemia due to chronic kidney disease   ESRD (end stage renal disease) (HCC)   S/P CABG x 3   Orthostatic hypotension   Hemodialysis patient   Discharged Condition: stable  Significant Diagnostic Studies: CT ABDOMEN PELVIS WO CONTRAST Result Date: 01/25/2024 CLINICAL DATA:  Anemia, occult blood in stool. EXAM: CT ABDOMEN AND PELVIS WITHOUT CONTRAST TECHNIQUE: Multidetector CT imaging of the abdomen and pelvis was performed following the standard protocol without IV contrast. RADIATION DOSE REDUCTION: This exam was performed according to the departmental dose-optimization program which includes automated exposure control, adjustment of the mA and/or kV according to patient size and/or use of iterative reconstruction technique. COMPARISON:  None Available. FINDINGS: Lower chest: Small right and moderate left pleural effusions. Compressive atelectasis in both lower lobes with mild volume loss in the lingula. Heart is enlarged. Atherosclerotic calcification of the aorta, aortic valve and coronary arteries. No pericardial effusion. Gynecomastia. Distal esophagus is grossly unremarkable. Hepatobiliary: Liver and gallbladder are unremarkable. No biliary ductal dilatation. Pancreas: Negative. Spleen: Negative. Adrenals/Urinary Tract: Adrenal glands and kidneys are grossly unremarkable. Renal vascular calcifications bilaterally. Ureters are decompressed. Bladder is grossly unremarkable. Stomach/Bowel: Stomach, small bowel and colon are unremarkable. Appendix is not readily visualized. Vascular/Lymphatic: Atherosclerotic calcification of the aorta. No pathologically enlarged lymph nodes. Reproductive: Prostate is visualized. Other: Small  bilateral inguinal hernias contain fat. Small umbilical hernia contains fat. No free fluid. Mesenteries and peritoneum are unremarkable. Musculoskeletal: Osteopenia. Degenerative changes in the spine. Sarcopenia. L4-5 spinal process fixation. IMPRESSION: 1. No acute findings to explain the patient's anemia. 2. Small right and moderate left pleural effusions with compressive atelectasis in both lower lobes. 3. Aortic atherosclerosis (ICD10-I70.0). Coronary artery calcification. Electronically Signed   By: Newell Eke M.D.   On: 01/25/2024 14:36   DG Chest 2 View Result Date: 01/18/2024 EXAM: 2 VIEW(S) XRAY OF THE CHEST 01/18/2024 09:52:00 AM COMPARISON: 01/14/2024 CLINICAL HISTORY: Pleural effusion on left FINDINGS: LINES, TUBES AND DEVICES: Tunneled right IJ hemodialysis catheter stable in place. Feeding tube removed. LUNGS AND PLEURA: Linear atelectasis at left lung base. Small left pleural effusion. Resolved pulmonary edema. No pneumothorax. HEART AND MEDIASTINUM: Stable cardiomegaly. Aortic atherosclerosis (ICD10-170.0). CABG markers noted. BONES AND SOFT TISSUES: No acute osseous abnormality. IMPRESSION: 1. Small left pleural effusion. Resolved pulmonary edema 2. Stable tunneled right internal jugular hemodialysis catheter. 3. Stable cardiomegaly status post CABG. 4. Aortic atherosclerosis. Electronically signed by: Dorethia Molt MD 01/18/2024 07:48 PM EST RP Workstation: [id number]     Labs:  Basic Metabolic Panel:    Latest Ref Rng & Units 02/02/2024    5:16 AM 01/31/2024    5:32 AM 01/29/2024    5:13 AM  BMP  Glucose 70 - 99 mg/dL 885  67  892   BUN 8 - 23 mg/dL 50  49  56   Creatinine 0.61 - 1.24 mg/dL 4.29  4.24  3.47   Sodium 135 - 145 mmol/L 133  132  132   Potassium 3.5 - 5.1 mmol/L 4.6  4.5  4.4   Chloride 98 - 111 mmol/L 96  94  95   CO2 22 - 32 mmol/L 27  27  26    Calcium  8.9 - 10.3 mg/dL 9.1  9.1  8.7  CBC:    Latest Ref Rng & Units 02/02/2024    5:16 AM 01/31/2024     5:32 AM 01/29/2024    5:13 AM  CBC  WBC 4.0 - 10.5 K/uL 10.0  9.9  10.2   Hemoglobin 13.0 - 17.0 g/dL 8.0  7.9  7.3   Hematocrit 39.0 - 52.0 % 25.0  24.6  22.8   Platelets 150 - 400 K/uL 328  330  358      CBG: Recent Labs  Lab 02/02/24 1156 02/02/24 1821 02/02/24 2134 02/03/24 0616 02/03/24 1128  GLUCAP 104* 121* 188* 146* 128*    Brief HPI:   Timothy Holloway is a 68 y.o. RH-male with history of T2DM, ESRD-MWF, HTN, vertigo who was admitted from his HD center with chest pain and SOB. 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 He was taken to OR for CABG X 4 on 01/08/24 by Dr. Shyrl. Post op had sudden onset of transient aphasia and right sided weakness on 12/09. 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 He had recurrent symptoms with lethargy, left gaze preference and severe dysarthria on 12/10. 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 Gretta recommended TCAR for management of symptomatic L-CAS.   He underwent L-TCAR on 12/10 and post op placed on liquid diet due to moderate dysphagia noted on FEES. MRI brain done revealing numerous small acute infarcts scattered throughout supratentorial and infratentorial brain with greatest involvement in left cerebral hemisphere.  Dr. Jerri recommended consulted addition of Plavix  for stroke due to large vessel disease. Hospital course significant for syncope due to orthostatic hypotension. TEDs and binder were ineffective therefore midodrine  adde for BP support and CRRT used till BP improved and able to tolerate HD.  PT/OT consulted and has been working with patient who required min to max assist with ADLs and min to mod assist with mobility. He was independent PTA and CIR recommended due to functional decline.    Hospital Course: Timothy Holloway was admitted to rehab 01/18/2024 for  inpatient therapies to consist of PT and OT at least three hours five days a week. Past admission physiatrist, therapy team and rehab RN have worked together to provide customized collaborative inpatient rehab. His blood pressures were monitored on TID basis and he continued to have orthostatic changes requiring resumption of midodrine  which was titrated up to 15 mg TID to help with activity tolerance. HD has been ongoing at the end of the day on MWF schedule with close monitoring of weight which was  trending and felt to be likely due to indiscretion with fluid restriction.  By time of discharge weight is trending back down to baseline and no signs of overload noted.  Juven was added due to low protein stores and to promote wound healing. Partial thickness wound on sacrum has evolved with yellow eschar on wound bed which is being debrided by santyl  with damp to dry dressing.  Midline sternal wound is clean dry and intact and is healing well. His activity tolerance has improved and blood pressure started trending upwards.  Midodrine  was weaned off prior to discharge with recommendations to wear teds and binder as needed recurrent symptoms.  His diabetes has been monitored with ac/hs CBG checks and SSI was use prn for tighter BS control. Po intake has been good but blood sugar started trending down with  increase in activity.  His Lantus  was gradually decreased to 7 units twice daily and nighttime meal coverage was discontinued to avoid a.m. hypoglycemia.  He was advised to continue monitoring blood sugars on twice daily to 4 times daily basis after discharge and to titrate Lantus  to home dose if blood sugars are running over 120.  Follow-up CBC showed downward trend in H&H and was transfused with 1 unit PRBC on 12/28.  Iron  labs added weekly for supplementation.  Stool guaiacs ordered and he was noted to have 1 out of 2 heme positive stools.  CT abdomen pelvis was done for workup and showed no signs of bleeding.   Most recent CBC shows H&H to be 6 slowly improving.  Calcium  and phosphorus managed with use of Velphoro  3 times daily AC.  He has been tolerating hemodialysis and will be resume his home schedule of Monday Wednesday Friday after discharge.  Pain is managed with prn use of tramadol . He has made good gains during his rehab stay and is at modified independent level.  He will continue safe follow-up with home health PT and RN by [facility] after discharge.   Rehab course: During patient's stay in rehab weekly team conferences were held to monitor patient's progress, set goals and discuss barriers to discharge. At admission, patient required min assist with mobility and mod assist with basic self care tasks. He  has had improvement in activity tolerance, balance, postural control as well as ability to compensate for deficits.  He is able to complete ADL task at modified independent level.He is independent for transfers and is able to ambulate to 50 feet with use of rolling walker.  He requires contact-guard assist to climb 4 stairs.  Family education has been completed.   Discharge disposition: 06-Home-Health Care Svc  Diet: Carb modified/Renal diet/limit fluids to 1200 cc  Special Instructions: Continue sternal precautions.  2.  Santyl  with damp to dry dressing to sacral wound daily.  Continue pressure-relief measures and boost every 30 minutes when in chair. 3.  [facility] consulted to follow-up on sacral wound after discharge.    Discharge Instructions     AMB referral to wound care center   Complete by: As directed    Patient has HD on MWF   Ambulatory referral to Neurology   Complete by: As directed    An appointment is requested in approximately: weeks   Ambulatory referral to Physical Medicine Rehab   Complete by: As directed       Allergies as of 02/03/2024       Reactions   Latex Hives   Lodine [etodolac] Nausea And Vomiting        Medication List      STOP taking these medications    carvedilol  6.25 MG tablet Commonly known as: COREG    Chlorhexidine  Gluconate Cloth 2 % Pads   cloNIDine  0.1 MG tablet Commonly known as: CATAPRES    Darbepoetin Alfa  100 MCG/0.5ML Sosy injection Commonly known as: ARANESP    fenofibrate  145 MG tablet Commonly known as: TRICOR    furosemide  80 MG tablet Commonly known as: LASIX    heparin  5000 UNIT/ML injection   HumaLOG KwikPen 100 UNIT/ML KwikPen Generic drug: insulin  lispro   hydrALAZINE  100 MG tablet Commonly known as: APRESOLINE    hydrochlorothiazide  25 MG tablet Commonly known as: HYDRODIURIL    isosorbide  mononitrate 30 MG 24 hr tablet Commonly known as: IMDUR    losartan  100 MG tablet Commonly known as: COZAAR    midodrine  5 MG  tablet Commonly known as: PROAMATINE    oxyCODONE  5 MG immediate release tablet Commonly known as: Oxy IR/ROXICODONE    sertraline 50 MG tablet Commonly known as: ZOLOFT   valACYclovir 1000 MG tablet Commonly known as: VALTREX       TAKE these medications    acetaminophen  325 MG tablet Commonly known as: TYLENOL  Take 1-2 tablets (325-650 mg total) by mouth every 4 (four) hours as needed for mild pain (pain score 1-3). What changed:  how much to take when to take this   albuterol  (2.5 MG/3ML) 0.083% nebulizer solution Commonly known as: PROVENTIL  Take 3 mLs (2.5 mg total) by nebulization every 4 (four) hours as needed for wheezing or shortness of breath.   Aspirin  EC Adult Low Dose 81 MG tablet Generic drug: aspirin  EC Take 1 tablet (81 mg total) by mouth daily. Swallow whole.   atorvastatin  80 MG tablet Commonly known as: LIPITOR Take 1 tablet (80 mg total) by mouth daily.   clopidogrel  75 MG tablet Commonly known as: PLAVIX  Take 1 tablet (75 mg total) by mouth daily.   collagenase  250 UNIT/GM ointment Commonly known as: SANTYL  Apply topically daily. Notes to patient: Daily cleanse open areas on buttocks with saline.  Apply a  layer of santyl  to yellow areas on buttock. Cover with moist gauze, then dry dressing. Change dressing daily. Can cover with foam dressing for padding--can pull back for dressing change  and change this every 3-4 days if not soiled.    EPINEPHrine  0.3 mg/0.3 mL Soaj injection Commonly known as: EPI-PEN Inject 0.3 mg into the muscle as needed for anaphylaxis.   Gerhardt's butt cream Crea Apply 1 Application topically daily. Use zinc oxide mixed with nystatin cream. Notes to patient: Use desitin or butt cream to reddened areas on buttocks.    insulin  aspart 100 UNIT/ML injection Commonly known as: novoLOG  Inject 5 Units into the skin 3 (three) times daily with meals. Notes to patient: Hold evening dose if BS continue to drop in mornings.    insulin  glargine 100 UNIT/ML injection Commonly known as: LANTUS  Inject 0.07 mLs (7 Units total) into the skin 2 (two) times daily. What changed: how much to take   lidocaine  5 % Commonly known as: LIDODERM  Place 2 patches onto the skin daily. Apply at 8 am and remove at 8 pm daily. Has to be off for at least 12 hours.   magic mouthwash Soln Take 5 mLs by mouth 4 (four) times daily. Suspension contains equal amounts of Maalox Extra Strength, nystatin, and diphenhydramine . Notes to patient: Can change to as needed if mouth is no longer sore   meclizine 25 MG tablet Commonly known as: ANTIVERT Take 25 mg by mouth daily as needed for dizziness.   multivitamin Tabs tablet Take 1 tablet by mouth at bedtime.   pantoprazole  40 MG tablet Commonly known as: PROTONIX  Take 1 tablet (40 mg total) by mouth daily.   polyethylene glycol powder 17 GM/SCOOP powder Commonly known as: GLYCOLAX /MIRALAX  Take 17 g by mouth 2 (two) times daily.   sodium chloride  0.65 % Soln nasal spray Commonly known as: OCEAN Place 1 spray into both nostrils daily as needed for congestion.   Stool Softener/Laxative 50-8.6 MG tablet Generic drug: senna-docusate Take 2  tablets by mouth 2 (two) times daily.   sucroferric oxyhydroxide 500 MG chewable tablet Commonly known as: VELPHORO  Chew 1 tablet (500 mg total) by mouth 3 (three) times daily with meals.   traMADol  50 MG tablet--Rx# 14 pills Commonly known  as: ULTRAM  Take 1 tablet (50 mg total) by mouth every 12 (twelve) hours as needed (mild pain, pain score 1-3).   vitamin D3 25 MCG tablet Commonly known as: CHOLECALCIFEROL  Take 1 tablet (1,000 Units total) by mouth daily.   zolpidem  5 MG tablet Commonly known as: AMBIEN  Take 1 tablet (5 mg total) by mouth at bedtime as needed for sleep.        Follow-up Information     Hemberg, Comer GAILS, NP Follow up.   Specialty: Adult Health Nurse Practitioner Why: Call in 1-2 days for post hospital follow up Contact information: [address] [place] [address] [phone]         [facility] Follow up.   Why: office will call you with follow up appointment Contact information: [address]     [address] [place]  [address] [phone]        Lorilee Sven SQUIBB, MD Follow up.   Specialty: Physical Medicine and Rehabilitation Why: office will call you with follow up appointment Contact information: [address] [place] [address] [phone]         [facility]              Follow up.   Why: office will call you with follow up appointment Contact information: [address] [place]  [address] [phone]                Signed: Sharlet GORMAN Schmitz 02/06/2024, 12:04 PM

## 2024-01-31 NOTE — Progress Notes (Signed)
 Physical Therapy Note  Patient Details  Name: ULYESS MUTO MRN: [id number] Date of Birth: February 09, 1955 Today's Date: 01/31/2024    Physical Therapist participated in the interdisciplinary team conference, providing clinical information regarding the patient's current status, treatment goals, and weekly focus, including any barriers that need to be addressed. Please see the Inpatient Rehabilitation Team Conference and Plan of Care Update for further details.    Haruye Lainez P Layne Lebon 01/31/2024, 11:07 AM

## 2024-01-31 NOTE — Progress Notes (Signed)
 Occupational Therapy Session Note  Patient Details  Name: Timothy Holloway MRN: [id number] Date of Birth: 27-Jul-1955  Today's Date: 01/31/2024 OT Individual Time: 9267-9157 OT Individual Time Calculation (min): 70 min    Short Term Goals: Week 2:  OT Short Term Goal 1 (Week 2): STG=LTG due to LOS  Skilled Therapeutic Interventions/Progress Updates:   Patient received seated at edge of bed.  Walked to sink to wash and change into clean clothing.  Patient standing to remove and on clothing!  Patient without report of dizziness/ light headedness, wearing only compression hose - knee high.  Patient walked to bathroom and completed toileting using rollator with modified independence. Demonstrated sock aide and patient able to put socks on without further assistance.   Patient walked to main gym with supervision to modified independence.  Worked on teaching laboratory technician, working to point of fatigue and resting for shorter periods of time.  Reviewed breathing techniques to control rate / depth of inspiration / expiration.  Worked on single leg stance, and stepping up onto 3 block (simulating.)  Worked on recovering over 60-90 seconds (versus 5 min)  Returned to room and to bed to rest prior to PT session.    Grip strength:  Lue:  75lbs   RUE:  77lbs Therapy Documentation Precautions:  Precautions Precautions: Fall, Sternal Recall of Precautions/Restrictions: Impaired Precaution/Restrictions Comments: watch BP Restrictions Weight Bearing Restrictions Per Provider Order: No Other Position/Activity Restrictions: Sternal precautions General:   Vital Signs: Therapy Vitals Temp: 97.6 F (36.4 C) Pulse Rate: 66 Resp: 18 BP: (!) 150/67 Patient Position (if appropriate): Lying Oxygen Therapy SpO2: 100 % Pain:  Denies pain- reports chest pain is resolving ADL: ADL Eating: Set up Where Assessed-Eating: Edge of bed Grooming: Setup Where Assessed-Grooming: Edge of bed Upper Body Bathing:  Supervision/safety Where Assessed-Upper Body Bathing: Edge of bed Lower Body Bathing: Moderate assistance Where Assessed-Lower Body Bathing: Edge of bed Upper Body Dressing: Supervision/safety Where Assessed-Upper Body Dressing: Edge of bed Lower Body Dressing: Moderate assistance Where Assessed-Lower Body Dressing: Edge of bed Toileting: Moderate assistance Where Assessed-Toileting: Bedside Commode Toilet Transfer: Minimal assistance Toilet Transfer Method: Stand pivot Toilet Transfer Equipment: Bedside commode Tub/Shower Transfer: Unable to assess Tub/Shower Transfer Method: Unable to assess Film/video Editor Method: Unable to assess Vision   Perception    Praxis   Balance   Exercises:   Other Treatments:     Therapy/Group: Individual Therapy  Lanayah Gartley M 01/31/2024, 8:42 AM

## 2024-02-01 LAB — OCCULT BLOOD X 1 CARD TO LAB, STOOL: Fecal Occult Bld: NEGATIVE

## 2024-02-01 LAB — HEPATITIS B SURFACE ANTIBODY, QUANTITATIVE: Hep B S AB Quant (Post): <3.5 m[IU]/mL — ABNORMAL LOW

## 2024-02-01 LAB — GLUCOSE, CAPILLARY
Glucose-Capillary: 118 mg/dL — ABNORMAL HIGH (ref 70–99)
Glucose-Capillary: 120 mg/dL — ABNORMAL HIGH (ref 70–99)
Glucose-Capillary: 101 mg/dL — ABNORMAL HIGH (ref 70–99)
Glucose-Capillary: 67 mg/dL — ABNORMAL LOW (ref 70–99)
Glucose-Capillary: 84 mg/dL (ref 70–99)

## 2024-02-01 MED ORDER — INSULIN GLARGINE 100 UNIT/ML ~~LOC~~ SOLN
10.0000 [IU] | Freq: Two times a day (BID) | SUBCUTANEOUS | Status: DC
  Administered 2024-02-02: 10 [IU] via SUBCUTANEOUS
  Filled 2024-02-01 (×3): qty 0.1

## 2024-02-01 MED ORDER — CHLORHEXIDINE GLUCONATE CLOTH 2 % EX PADS
6.0000 | MEDICATED_PAD | Freq: Every day | CUTANEOUS | Status: DC
  Administered 2024-02-02: 6 via TOPICAL

## 2024-02-01 NOTE — Progress Notes (Signed)
 Physical Therapy Session Note  Patient Details  Name: Timothy Holloway MRN: [id number] Date of Birth: 05-04-1955  Today's Date: 02/01/2024 PT Individual Time: 1100-1155 + 1530-1613 PT Individual Time Calculation (min): 55 min + 43 min  Short Term Goals: Week 2:  PT Short Term Goal 1 (Week 2): STG= LTG  Skilled Therapeutic Interventions/Progress Updates:      1st session: Pt sitting EOB with his wife present at the bedside. Focused session on family education and hands on training to prepare for DC. Pt has no complaints of pain, wearing knee-high compression socks only.   Wife with questions re: DC plan - how to pay copay for DME and getting medical letter to excuse him from [employer]. Relayed concerns to CSW and PA.   Pt ambulated during session at distant supervision level while using the rollator - pt safe with management and understands when to lock/unlock the rollator. Distances > 150'.   Completed car transfer with car height set to simulate their personal vehicle. Pt completed car transfer with supervision assist. Educated and used teach back method for his wife to fold and manage the rollator into/out of the vehicle.   Reviewed curb transfer (4 curb) with the rollator - educated wife on guarding and positioning, also need to assist him with lifting the front end of rollator up the curb, pt able to complete the rest of the task without assist. Completed x4 reps total.   Completed stair training using 6 steps and 2 hand rails, pt able to complete up to x4 steps (up/down) total before feeling wiped out and needing a seated rest break before recovery. CGA for safety.   Pt returned to his room at conclusion of session, was left seated EOB with his wife present. Needs met.   2nd session: Pt in bed talking on the phone with his wife - wife requesting to speak to PT. Wife reporting that she called [employer] to pay the copay for the DME (rollator and tub bench) but received a  message that the buisness was closed for Holiday until Monday 1/5. Relayed concern to the CSW to follow up and problem solve a solution or alternative method of obtaining a rollator.   Bed mobility completed mod I. Sit<>stand to rollator mod I from bed. He ambulates with distant supervision 150' to the main gym.   Addressed standing balance, BLE strengthening, and cardiovascular endurance training: -2x1 minute high knee marching, BUE support -2x1 minute standing heel raises, BUE support -1x1 minute mini squats, BUE support  Pt returned to his room at conclusion of session, left seated EOB, needs met.   Therapy Documentation Precautions:  Precautions Precautions: Fall, Sternal Recall of Precautions/Restrictions: Impaired Precaution/Restrictions Comments: watch BP Restrictions Weight Bearing Restrictions Per Provider Order: No Other Position/Activity Restrictions: Sternal precautions General:       Therapy/Group: Individual Therapy  Sherlean SHAUNNA Perks 02/01/2024, 7:43 AM

## 2024-02-01 NOTE — Progress Notes (Signed)
 Occupational Therapy Discharge Summary  Patient Details  Name: Timothy Holloway MRN: [id number] Date of Birth: 06-Mar-1955  Date of Discharge from OT service:February 02, 2024  Patient has met 7 of 7 long term goals due to improved activity tolerance, improved balance, postural control, ability to compensate for deficits, functional use of  RIGHT upper and RIGHT lower extremity, and improved coordination.  Patient to discharge at overall Modified Independent level.  Patient's care partner is independent to provide the necessary physical assistance at discharge.    Reasons goals not met: NA  Recommendation:  No further OT warranted  Equipment: Tub transfer bench  Reasons for discharge: treatment goals met  Patient/family agrees with progress made and goals achieved: Yes  OT Discharge Precautions/Restrictions  Precautions Precautions: Fall;Sternal Precaution Booklet Issued: No Precaution/Restrictions Comments: chronic low BP Restrictions Weight Bearing Restrictions Per Provider Order: No Other Position/Activity Restrictions: Sternal precautions Pain Pain Assessment Pain Scale: 0-10 Pain Score: 4  Pain Type: Acute pain;Surgical pain Pain Location: Chest Pain Orientation: Medial Pain Descriptors / Indicators: Aching Pain Frequency: Intermittent Pain Onset: Gradual Patients Stated Pain Goal: 0 Pain Intervention(s): Medication (See eMAR);Repositioned ADL ADL Eating: Independent Where Assessed-Eating: Edge of bed Grooming: Modified independent Where Assessed-Grooming: Standing at sink, Sitting at sink Upper Body Bathing: Modified independent Where Assessed-Upper Body Bathing: Edge of bed, Chair Lower Body Bathing: Modified independent Where Assessed-Lower Body Bathing: Edge of bed Upper Body Dressing: Independent Where Assessed-Upper Body Dressing: Edge of bed Lower Body Dressing: Modified independent Where Assessed-Lower Body Dressing: Edge of bed (with sock  aide) Toileting: Modified independent Where Assessed-Toileting: Teacher, Adult Education: Engineer, Agricultural Method: Proofreader: Gaffer: Close supervison Web Designer Method: Ship Broker: Insurance Underwriter: Unable to Engineer, Technical Sales Method: Unable to assess Vision Baseline Vision/History: 0 No visual deficits Patient Visual Report: No change from baseline Vision Assessment?: Wears glasses for reading;No apparent visual deficits Eye Alignment: Within Functional Limits Tracking/Visual Pursuits: Able to track stimulus in all quads without difficulty Saccades: Within functional limits Convergence: Within functional limits Visual Fields: No apparent deficits Perception  Perception: Within Functional Limits Praxis Praxis: WFL Cognition Cognition Overall Cognitive Status: Within Functional Limits for tasks assessed Arousal/Alertness: Awake/alert Orientation Level: Person;Place;Situation Person: Oriented Place: Oriented Situation: Oriented Memory: Appears intact Attention: Selective Selective Attention: Appears intact Awareness: Appears intact Problem Solving: Appears intact Safety/Judgment: Appears intact Brief Interview for Mental Status (BIMS) Repetition of Three Words (First Attempt): 3 Temporal Orientation: Year: Correct Temporal Orientation: Month: Accurate within 5 days Temporal Orientation: Day: Correct Recall: Sock: Yes, no cue required Recall: Blue: Yes, no cue required Recall: Bed: Yes, no cue required BIMS Summary Score: 15 Sensation Sensation Light Touch: Appears Intact Hot/Cold: Appears Intact Proprioception: Appears Intact Stereognosis: Appears Intact Coordination Gross Motor Movements are Fluid and Coordinated: No Fine Motor Movements are Fluid and Coordinated: Yes Finger Nose Finger Test: Mild undershooting  with RUE Motor  Motor Motor: Within Functional Limits Motor - Discharge Observations: Generalized weakness - significantly improved from admission Mobility  Bed Mobility Bed Mobility: Rolling Right;Rolling Left;Supine to Sit;Sit to Supine Rolling Right: Independent Rolling Left: Independent Supine to Sit: Independent Sit to Supine: Independent Transfers Sit to Stand: Independent with assistive device Stand to Sit: Independent with assistive device  Trunk/Postural Assessment  Cervical Assessment Cervical Assessment: Within Functional Limits Thoracic Assessment Thoracic Assessment: Within Functional Limits Lumbar Assessment Lumbar Assessment: Within Functional Limits Postural Control Postural Control: Deficits on evaluation Righting Reactions:  mild posterior bias in unsupported standing, slightly delayed righting reaction  Balance Balance Balance Assessed: Yes Static Sitting Balance Static Sitting - Balance Support: Feet supported;No upper extremity supported Static Sitting - Level of Assistance: 7: Independent Dynamic Sitting Balance Dynamic Sitting - Balance Support: Feet supported;No upper extremity supported;During functional activity Dynamic Sitting - Level of Assistance: 7: Independent Static Standing Balance Static Standing - Balance Support: During functional activity;Right upper extremity supported;Left upper extremity supported Static Standing - Level of Assistance: 6: Modified independent (Device/Increase time) Dynamic Standing Balance Dynamic Standing - Balance Support: Right upper extremity supported;Left upper extremity supported;During functional activity Dynamic Standing - Level of Assistance: 6: Modified independent (Device/Increase time) Extremity/Trunk Assessment RUE Assessment RUE Assessment: Within Functional Limits Active Range of Motion (AROM) Comments: considering sternal precautions General Strength Comments: NT due to sternal precautions - grip 78  lb LUE Assessment LUE Assessment: Within Functional Limits Active Range of Motion (AROM) Comments: Considering sternal precautions General Strength Comments: NT due to sternal precautions, grip strength 75 lb   Timothy Holloway 02/01/2024, 12:17 PM

## 2024-02-01 NOTE — Progress Notes (Signed)
" °   02/01/24 0401  MEWS COLOR  MEWS Score Color Green  Orthostatic Lying   BP- Lying 168/72  Pulse- Lying 68  Orthostatic Sitting  BP- Sitting 158/84  Pulse- Sitting 72  Orthostatic Standing at 0 minutes  BP- Standing at 0 minutes 127/63  Pulse- Standing at 0 minutes 88   Patient alert and oriented x 4. Orthostatic vital signs completed with abdominal binder and TED hose on. Patient not showing any signs of discomfort or distress. Patient denies dizziness. "

## 2024-02-01 NOTE — Progress Notes (Signed)
 Occupational Therapy Session Note  Patient Details  Name: JAQWON MANFRED MRN: [id number] Date of Birth: 1955-11-06  Today's Date: 02/01/2024 OT Individual Time: 9149-9054 OT Individual Time Calculation (min): 55 min    Short Term Goals: Week 2:  OT Short Term Goal 1 (Week 2): STG=LTG due to LOS  Skilled Therapeutic Interventions/Progress Updates:   Patient received seated at edge of bed. Patient asking to walk - declines need to void, and has already bathed and dressed for the day.  Patient able now to walk unassisted to bathroom safely using rollator.  Discussed option of removing alarms and allowing patient to walk unassisted in room in near future.  Patient reports chest pain (musculoskeletal) this am - he had taken medication and reports pain reduced.  Worked on self pacing, and patient recognizing when he needs breaks.  Patient able to walk ` 130-140 feet multiple times, needing 2-4 min break to recover dyspnea.   Completed discharge testing with patient.  Patient left in bed for rest break prior to PT session.    Therapy Documentation Precautions:  Precautions Precautions: Fall, Sternal Precaution Booklet Issued: No Recall of Precautions/Restrictions: Impaired Precaution/Restrictions Comments: chronic low BP Restrictions Weight Bearing Restrictions Per Provider Order: No Other Position/Activity Restrictions: Sternal precautions   Pain: Pain Assessment Pain Scale: 0-10 Pain Score: 4  Pain Type: Acute pain;Surgical pain Pain Location: Chest Pain Orientation: Medial Pain Descriptors / Indicators: Aching Pain Frequency: Intermittent Pain Onset: Gradual Patients Stated Pain Goal: 0 Pain Intervention(s): Medication (See eMAR);Repositioned      Therapy/Group: Individual Therapy  Elexia Friedt M 02/01/2024, 12:17 PM

## 2024-02-01 NOTE — Progress Notes (Signed)
 Patient ID: Timothy Holloway, male   DOB: Apr 13, 1955, 69 y.o.   MRN: [id number]  Discharge date updated to 1/03 due to HD on 1/02

## 2024-02-01 NOTE — Progress Notes (Addendum)
 "                                                        PROGRESS NOTE   Subjective/Complaints:  Reports didn't sleep well, takes Ambien . He didn't try melatonin last night.  LBM today Had a little chest soreness but resolved.   ROS: dizziness- improved, fatigue improved Per HPI  Pt denies  HA, SOB, abd pain, CP, N/V/D, and vision changes A little anxious about going home   Objective:   No results found.   Recent Labs    01/31/24 0532  WBC 9.9  HGB 7.9*  HCT 24.6*  PLT 330   Recent Labs    01/31/24 0532  NA 132*  K 4.5  CL 94*  CO2 27  GLUCOSE 67*  BUN 49*  CREATININE 5.75*  CALCIUM  9.1     Intake/Output Summary (Last 24 hours) at 02/01/2024 0911 Last data filed at 02/01/2024 9097 Gross per 24 hour  Intake 1026 ml  Output 3100 ml  Net -2074 ml     Wound 01/18/24 1604 Pressure Injury Sacrum Unstageable - Full thickness tissue loss in which the base of the injury is covered by slough (yellow, tan, gray, green or brown) and/or eschar (tan, brown or black) in the wound bed. (Active)    Physical Exam: Vital Signs Blood pressure (!) 145/55, pulse 72, temperature 98 F (36.7 C), temperature source Oral, resp. rate 18, height 6' (1.829 m), weight 129.1 kg, SpO2 96%.    General: awake, alert, appropriate, sitting in wheelchair; NAD HENT: conjugate gaze; oropharynx moist CV: regular rate and rhythm; no JVD Pulmonary: CTA B/L; non-labored GI: soft, NT, ND, (+)BS Psychiatric: appropriate, cooperative Neurological: alert and awake Skin- sternal incision-CDI Extremities: 1+ LE edema b/l LEs Neuro: Alert and oriented x3, decreased sensation in bilateral feet. Moving all 4 extremities to gravity and resistance  Assessment/Plan: 1. Functional deficits which require 3+ hours per day of interdisciplinary therapy in a comprehensive inpatient rehab setting. Physiatrist is providing close team supervision and 24 hour management of active medical problems listed  below. Physiatrist and rehab team continue to assess barriers to discharge/monitor patient progress toward functional and medical goals  Care Tool:  Bathing    Body parts bathed by patient: Chest, Left arm, Right arm, Abdomen, Front perineal area, Right upper leg, Left upper leg, Face, Buttocks, Right lower leg, Left lower leg   Body parts bathed by helper: Right lower leg, Left lower leg, Buttocks     Bathing assist Assist Level: Supervision/Verbal cueing     Upper Body Dressing/Undressing Upper body dressing   What is the patient wearing?: Pull over shirt    Upper body assist Assist Level: Set up assist    Lower Body Dressing/Undressing Lower body dressing      What is the patient wearing?: Pants, Underwear/pull up     Lower body assist Assist for lower body dressing: Contact Guard/Touching assist     Toileting Toileting    Toileting assist Assist for toileting: Independent with assistive device     Transfers Chair/bed transfer  Transfers assist     Chair/bed transfer assist level: Independent with assistive device     Locomotion Ambulation   Ambulation assist      Assist level: Supervision/Verbal cueing Assistive device: Rollator Max distance: 152  Walk 10 feet activity   Assist     Assist level: Supervision/Verbal cueing Assistive device: Rollator   Walk 50 feet activity   Assist Walk 50 feet with 2 turns activity did not occur: Safety/medical concerns (fatigue, SOB, dizziness, weakness/deconditioning)  Assist level: Supervision/Verbal cueing Assistive device: Rollator    Walk 150 feet activity   Assist Walk 150 feet activity did not occur: Safety/medical concerns (fatigue, SOB, dizziness, weakness/deconditioning)  Assist level: Supervision/Verbal cueing Assistive device: Rollator    Walk 10 feet on uneven surface  activity   Assist Walk 10 feet on uneven surfaces activity did not occur: Safety/medical concerns (fatigue,  SOB, dizziness, weakness/deconditioning)         Wheelchair     Assist Is the patient using a wheelchair?: Yes Type of Wheelchair: Manual    Wheelchair assist level: Dependent - Patient 0% Max wheelchair distance: 180ft    Wheelchair 50 feet with 2 turns activity    Assist        Assist Level: Dependent - Patient 0%   Wheelchair 150 feet activity     Assist      Assist Level: Dependent - Patient 0%   Blood pressure (!) 145/55, pulse 72, temperature 98 F (36.7 C), temperature source Oral, resp. rate 18, height 6' (1.829 m), weight 129.1 kg, SpO2 96%.  Medical Problem List and Plan: 1. 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  -Expected discharge- saturday   2. Pain: d/c oxycodone , continue tramadol  for moderate pain.              --Continue lidocaine  patch to chest wall for local measures  12/31 pain controlled, reports tramadol  helping  3. Therapy: TEDs/Binder as continues to have BP drop but asymptomatic per OT notes -increase midodrine  to 15mg  TID, continue this dose, discussed that dizziness has improved - 12/29 patient has had a couple elevated BP readings, we will decrease midodrine  back to 10 mg 3 times daily for hypertension -12/30 will decrease midodrine  to 7.5mg  TID -12/31 decrease midodrine  to 5 mg 3 times daily -1/1 continue current regimen and monitor     02/01/2024    6:16 AM 02/01/2024    4:16 AM 02/01/2024    4:04 AM  Vitals with BMI  Weight  284 lbs 10 oz   BMI  38.59   Systolic 145  158  Diastolic 55  84  Pulse   72     6. 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    -sternal incision assessed and is healing well  7.  T2DM: Hgb A1c- 9.9. Monitor BS ac/hs, Add CM restrictions to diet. Used Lantus  50 units PTA  -decrease aspart to 6U with meals due to hypoglycemia. D/c ISS   CBG (last 3)  Recent Labs    01/31/24 1159 01/31/24 1952 02/01/24 0551  GLUCAP 99 93 118*   Will reduce lantus  to 22U, continue this dose  12/27- will reduce  Lantus  to 20 units since dropped to 68 this AM- also attempted to order night snack, although required a comment to get it in the note  12/28- Pt's BG's looking better in last 24 hours- con't regimen-   -12/30 decrease lantus  to 17 units to avoid hypoglycemia  -12/31 Decrease lantus  to 13 units due to hypoglycemia  -1/1 decrease lantus  to 10u to avoid hypoglycemia  8. ESRD: Off CRRT and back on MWF HD schedule. Continue fluid restriction --schedule therapy at the end of the day to help with tolerance of therapy.  12/28- LE edema a little worse- weight UP 1 LB- will order ACE wraps- don't think TEDs would fit-  12/31 reviewed nephrology note 9.  Acute on chronic anemia: Hgb reviewed and was 9 on 12/9. On Epo Reviewed stool occult- one was positive and one was negative, Hgb did not improve as much as expected with 1U PRBCs on 12/25- CT abdomen ordered and was negative for acute bleed. Hgb reviewed 12/26 and improved. Heparin  decreased to q12H  12/27- Hb 7.8- about his normal last 7 days or so- running 7.0-8.0  -on Aranesp   FOBT ordered by nephrology-pending, will send message to nursing to check next BM- negative on 02/01/24  HGB up to 7.9 12/31 10. Leucocytosis: Has had leucocytosis which continues to fluctuate             --12.2 at admission    12/31 stable at 9.9 WBC     Latest Ref Rng & Units 01/31/2024    5:32 AM 01/29/2024    5:13 AM 01/27/2024    6:26 AM  CBC  WBC 4.0 - 10.5 K/uL 9.9  10.2  9.2   Hemoglobin 13.0 - 17.0 g/dL 7.9  7.3  7.8   Hematocrit 39.0 - 52.0 % 24.6  22.8  24.4   Platelets 150 -  400 K/uL 330  358  363      11. Constipation: Change dulcolax to senna S 2 bid. LBM 12/25- d/c suppository  12/27- LBM yesterday- type 6  12/29-hard BM documented yesterday, add MiraLAX   12/30 LBM yesterday, monitor   12/31 increase miralax  to BID  1/1 LBM today 12.  Low protein stores: Albumin  reviewed and is improving, d/c Juven  13. Odynophagia: continue MMW, messaged nursing to see if this is still an issue  14. Class 2 Obesity: BMI- 37.4 on 12/20 Educate on diet and exercise to promote overall health and mobility.   12/28- BMI up to 40.48 today- large jump considering his fluid status/ESRD- will d/w renal  12/28- per pt and nursing, sticking to 1200cc/day of fluid restriction- in spite of Na down to 128 15. Atelectasis: continue flutter valve.  16. Hyponatremia  12/28- Na 128- running 128- 136 in last 1 week- will d/w renal  12/28- renal felt pt might not be sticking to 1200cc/day- per staff and pt, he is- will recheck BMP today   LOS: 14 days A FACE TO FACE EVALUATION WAS PERFORMED  Murray Collier 02/01/2024, 9:11 AM     "

## 2024-02-01 NOTE — Progress Notes (Signed)
 Patient ID: Timothy Holloway, male   DOB: [birth year], 69 y.o.   MRN: [id number]  Number to [employer] was provided to call and make co-pay.   Letter for Jury Duty to be signed tomorrow and provided.

## 2024-02-01 NOTE — Progress Notes (Signed)
 " [facility] Progress Note   Subjective:    Last HD on 12/31 with 3 kg UF.  100 mL UOP over 12/31 and one unmeasured urine void.  Confirms outpatient HD is MWF schedule.  Looking forward to Saturday.   Review of systems:  Denies shortness of breath or chest pain Denies n/v   Objective Vitals:   02/01/24 0404 02/01/24 0416 02/01/24 0616 02/01/24 1334  BP: (!) 158/84  (!) 145/55 137/60  Pulse: 72   66  Resp: 18   16  Temp:    98.2 F (36.8 C)  TempSrc:    Oral  SpO2: 96%   96%  Weight:  129.1 kg    Height:       Physical Exam   General adult male in bed in no acute distress HEENT normocephalic atraumatic extraocular movements intact sclera anicteric Neck supple trachea midline Lungs clear to auscultation bilaterally normal work of breathing at rest on room air Heart S1S2 midline incision healing;  Abdomen soft nontender obese habitus/distended Extremities 1+ edema compression hose on  Psych normal mood and affect Dialysis Access: TDC in R chest  Additional Objective Labs: Basic Metabolic Panel: Recent Labs  Lab 01/26/24 1412 01/28/24 1057 01/29/24 0513 01/31/24 0532  NA 128* 132* 132* 132*  K 4.3 4.4 4.4 4.5  CL 92* 93* 95* 94*  CO2 24 27 26 27   GLUCOSE 137* 109* 107* 67*  BUN 63* 47* 56* 49*  CREATININE 6.72* 5.59* 6.52* 5.75*  CALCIUM  8.6* 8.9 8.7* 9.1  PHOS 4.2  --   --  3.9   Liver Function Tests: Recent Labs  Lab 01/26/24 1412 01/31/24 0532  ALBUMIN  3.2* 3.4*   CBC: Recent Labs  Lab 01/26/24 0609 01/27/24 0626 01/29/24 0513 01/31/24 0532  WBC 10.9* 9.2 10.2 9.9  NEUTROABS 7.4 6.2 6.6  --   HGB 8.2* 7.8* 7.3* 7.9*  HCT 25.7* 24.4* 22.8* 24.6*  MCV 92.8 92.8 93.8 92.5  PLT 418* 363 358 330   Medications:   aspirin  EC  81 mg Oral Daily   atorvastatin   80 mg Oral Daily   Chlorhexidine  Gluconate Cloth  6 each Topical Q12H   cholecalciferol   1,000 Units Oral Daily   clopidogrel   75 mg Oral Daily   collagenase    Topical  Daily   darbepoetin (ARANESP ) injection - DIALYSIS  150 mcg Subcutaneous Q Thu-1800   Gerhardt's butt cream   Topical Daily   heparin   5,000 Units Subcutaneous Q12H   insulin  aspart  5 Units Subcutaneous TID WC   insulin  glargine  10 Units Subcutaneous BID   lidocaine   2 patch Transdermal Q24H   magic mouthwash  5 mL Oral QID   midodrine   5 mg Oral TID WC   multivitamin  1 tablet Oral QHS   pantoprazole   40 mg Oral Daily   polyethylene glycol  17 g Oral BID   senna-docusate  2 tablet Oral BID   sodium chloride  flush  10-40 mL Intracatheter Q12H   sucroferric oxyhydroxide  500 mg Oral TID WC   zolpidem   5 mg Oral QHS    Dialysis Orders MWF - [facility] 4h B350  133.5kg  TDC  Heparin  2000+ 1400u/hr Calcitriol 1.25 three times per week Mircera 90 mcg q 2 wks (last 12/1)  CXR 12/11-> resolved vasc congestion, improved IS edema   Assessment/Plan: ESRD - usual HD schedule MWF. CRRT used from 12/10 - 12/13 for BP stability in the face of stroke-like symptoms. HD  per MWF schedule Renal panel in AM BP/volume: midodrine  was titrated down to 5mg  TID.  UF as tolerated. Anemia of ESRD: Continue Aranesp  150mcg q Thursday, s/p 1U PRBCs on 12/24, Hgb trending down again.FOBT negative on 1/1 - had been positive 12/24.  Secondary HPTH: Ca/Phos ok - continue Velphoro  NSTEMI - 3V CAD by LHC -> s/p CABG 01/08/24 Stroke-like symptoms: was waxing/ waning per neuro. There was no acute CVA by imaging, but due to advanced carotid disease by CTA pt underwent L carotid artery stent per [facility] 12/10.   Patient states discharge is planned for this Saturday   Katheryn JAYSON Saba, MD 02/01/2024, 1:54 PM [facility] "

## 2024-02-02 ENCOUNTER — Other Ambulatory Visit (HOSPITAL_COMMUNITY): Payer: Self-pay

## 2024-02-02 ENCOUNTER — Encounter: Payer: Self-pay | Admitting: Physical Medicine and Rehabilitation

## 2024-02-02 ENCOUNTER — Ambulatory Visit: Payer: Self-pay | Admitting: Thoracic Surgery (Cardiothoracic Vascular Surgery)

## 2024-02-02 ENCOUNTER — Telehealth: Payer: Self-pay | Admitting: Internal Medicine

## 2024-02-02 DIAGNOSIS — S31000S Unspecified open wound of lower back and pelvis without penetration into retroperitoneum, sequela: Secondary | ICD-10-CM

## 2024-02-02 LAB — CBC
HCT: 25 % — ABNORMAL LOW (ref 39.0–52.0)
Hemoglobin: 8 g/dL — ABNORMAL LOW (ref 13.0–17.0)
MCH: 30.3 pg (ref 26.0–34.0)
MCHC: 32 g/dL (ref 30.0–36.0)
MCV: 94.7 fL (ref 80.0–100.0)
Platelets: 328 K/uL (ref 150–400)
RBC: 2.64 MIL/uL — ABNORMAL LOW (ref 4.22–5.81)
RDW: 15.7 % — ABNORMAL HIGH (ref 11.5–15.5)
WBC: 10 K/uL (ref 4.0–10.5)
nRBC: 0 % (ref 0.0–0.2)

## 2024-02-02 LAB — GLUCOSE, CAPILLARY
Glucose-Capillary: 117 mg/dL — ABNORMAL HIGH (ref 70–99)
Glucose-Capillary: 104 mg/dL — ABNORMAL HIGH (ref 70–99)
Glucose-Capillary: 121 mg/dL — ABNORMAL HIGH (ref 70–99)
Glucose-Capillary: 188 mg/dL — ABNORMAL HIGH (ref 70–99)

## 2024-02-02 LAB — RENAL FUNCTION PANEL
Albumin: 3.4 g/dL — ABNORMAL LOW (ref 3.5–5.0)
Anion gap: 11 (ref 5–15)
BUN: 50 mg/dL — ABNORMAL HIGH (ref 8–23)
CO2: 27 mmol/L (ref 22–32)
Calcium: 9.1 mg/dL (ref 8.9–10.3)
Chloride: 96 mmol/L — ABNORMAL LOW (ref 98–111)
Creatinine, Ser: 5.7 mg/dL — ABNORMAL HIGH (ref 0.61–1.24)
GFR, Estimated: 10 mL/min — ABNORMAL LOW
Glucose, Bld: 114 mg/dL — ABNORMAL HIGH (ref 70–99)
Phosphorus: 3.8 mg/dL (ref 2.5–4.6)
Potassium: 4.6 mmol/L (ref 3.5–5.1)
Sodium: 133 mmol/L — ABNORMAL LOW (ref 135–145)

## 2024-02-02 MED ORDER — ALTEPLASE 2 MG IJ SOLR: 2.0000 mg | Freq: Once | INTRAMUSCULAR | Status: DC | PRN

## 2024-02-02 MED ORDER — HEPARIN SODIUM (PORCINE) 1000 UNIT/ML DIALYSIS
1000.0000 [IU] | INTRAMUSCULAR | Status: DC | PRN
  Administered 2024-02-02: 1000 [IU]

## 2024-02-02 MED ORDER — INSULIN ASPART 100 UNIT/ML IJ SOLN
5.0000 [IU] | Freq: Two times a day (BID) | INTRAMUSCULAR | Status: DC
  Administered 2024-02-02 – 2024-02-03 (×2): 5 [IU] via SUBCUTANEOUS
  Filled 2024-02-02 (×2): qty 5

## 2024-02-02 MED ORDER — HEPARIN SODIUM (PORCINE) 1000 UNIT/ML IJ SOLN
INTRAMUSCULAR | Status: AC
  Filled 2024-02-02: qty 4

## 2024-02-02 MED ORDER — INSULIN GLARGINE 100 UNIT/ML ~~LOC~~ SOLN
7.0000 [IU] | Freq: Two times a day (BID) | SUBCUTANEOUS | Status: DC
  Administered 2024-02-02 – 2024-02-03 (×2): 7 [IU] via SUBCUTANEOUS
  Filled 2024-02-02 (×3): qty 0.07

## 2024-02-02 MED ORDER — INSULIN GLARGINE 100 UNIT/ML ~~LOC~~ SOLN: 10.0000 [IU] | Freq: Two times a day (BID) | SUBCUTANEOUS | Status: DC

## 2024-02-02 MED ORDER — INSULIN GLARGINE 100 UNIT/ML ~~LOC~~ SOLN: 7.0000 [IU] | Freq: Two times a day (BID) | SUBCUTANEOUS | Status: AC

## 2024-02-02 MED ORDER — LIDOCAINE-PRILOCAINE 2.5-2.5 % EX CREA: 1.0000 | TOPICAL_CREAM | CUTANEOUS | Status: DC | PRN

## 2024-02-02 MED ORDER — PENTAFLUOROPROP-TETRAFLUOROETH EX AERO: 1.0000 | INHALATION_SPRAY | CUTANEOUS | Status: DC | PRN

## 2024-02-02 NOTE — Progress Notes (Signed)
" °   02/02/24 1945  Vitals  Temp 98.1 F (36.7 C)  Pulse Rate 81  Resp 18  BP 131/74  SpO2 100 %  O2 Device Nasal Cannula  Weight (S)  128.1 kg (Bed Scale)  Type of Weight Post-Dialysis  Oxygen Therapy  O2 Flow Rate (L/min) 2 L/min  Patient Activity (if Appropriate) In bed  Pulse Oximetry Type Continuous  Post Treatment  Dialyzer Clearance Clear  Hemodialysis Intake (mL) 0 mL  Liters Processed 87  Fluid Removed (mL) 3500 mL  Tolerated HD Treatment Yes  Post-Hemodialysis Comments Pt. voice no complaints.Tx went well but patient clotted off the machine and new set-up was provided. UF goal maintained per order. Report call to 5M bedside Nurse.   Received patient in bed to unit.  Alert and oriented.  Informed consent signed and in chart.   TX duration: 3.5  Patient tolerated well.  Transported back to the room  Alert, without acute distress.  Hand-off given to patient's nurse.   Access used: Yes Access issues: No  Total UF removed: 3500 Medication(s) given: See MAR Post HD VS: See Above Grid Post HD weight: 128.1 kg   Zebedee DELENA [facility] "

## 2024-02-02 NOTE — Telephone Encounter (Signed)
 Pts wife calling to reschedule hospital f/u with Dr. Okey. Did not see any open slots. Please advise.

## 2024-02-02 NOTE — Consult Note (Addendum)
 WOC Nurse wound follow up Re-consult requested for sacrum wound decline, according to consult request.  Performed remotely after review of progress notes and photos in the EMR.  Sacrum with Unstageable pressure injury; 100% tightly adhered yellow slough; refer to previous WOC consult on 12/19, when Santyl  was ordered. This pressure injury was noted as present on admission in the bedside nurses' wound care flow sheet.   Wound has not made progress, despite Santyl  currently being the most effective enzymatic debridement available, according to best practice. Options are therefore limited. I will add a request for Physical therapy to begin hydrotherapy 2 times a week to assist with removal of nonviable tissue.  Consider surgical consult for possible bedside debridement.    Topical treatment orders provided as follows: PT to perform hydrotherapy to sacrum wound twice a week, then apply Santyl  and moist gauze dressing.  Please re-consult if further assistance is needed.  Thank-you,   Stephane Fought MSN, RN, CWOCN, CWCN-AP, CNS Contact Mon-Fri 0700-1500: [phone]

## 2024-02-02 NOTE — Progress Notes (Incomplete)
 Inpatient Rehabilitation Discharge Medication Review by a Pharmacist  A complete drug regimen review was completed for this patient to identify any potential clinically significant medication issues.  High Risk Drug Classes Is patient taking? Indication by Medication  Antipsychotic No   Anticoagulant No   Antibiotic No   Opioid Yes Tramadol : moderate pain  Antiplatelet Yes Aspirin , clopidogrel : CAD, s/p CABG  Hypoglycemics/insulin  Yes Aspart, glargine: DM  Vasoactive Medication Yes Midodrine : low BP  Chemotherapy No   Other Yes Atorvastatin : HLD Lidocaine  patch, acetaminophen : pain Magic mouthwash: pain, infection Sucroferric oxyhydroxide: Phos binder, anemia Pantoprazole : reflux  Miralax , senna-docusate: constipation Zolpidem : sleep Albuterol : SOB NaCl nasal: congestion Santyl , Gerhardt's butt cream: sacral wound care Reanl MVI, vit D: supplement     Type of Medication Issue Identified Description of Issue Recommendation(s)  Drug Interaction(s) (clinically significant)     Duplicate Therapy     Allergy     No Medication Administration End Date     Incorrect Dose     Additional Drug Therapy Needed     Significant med changes from prior encounter (inform family/care partners about these prior to discharge). PTA medications stopped on inpatient discharge: furosemide , hydrochlorothiazide , sertraline, lispro, meclizine, losartan , valacyclovir, carvedilol , hydralazine , clonidine , Voltaren gel, Epi pen, fenofibrate , isosorbide  MN   Other       Clinically significant medication issues were identified that warrant physician communication and completion of prescribed/recommended actions by midnight of the next day:  No  Name of provider notified for urgent issues identified:   Provider Method of Notification:     Pharmacist comments:  -Midodrine  stopped inpatient 1/2 and to continue on med rec - notified P Love needs clarification -P Love aware of all the stopped meds to  be reconciled  Time spent performing this drug regimen review (minutes):  30

## 2024-02-02 NOTE — Progress Notes (Signed)
 Patient ID: Timothy Holloway, male   DOB: 10-25-1955, 69 y.o.   MRN: [id number]  [facility] PT/SN for wound buttock with [facility]

## 2024-02-02 NOTE — Progress Notes (Signed)
 Inpatient Rehabilitation Discharge Medication Review by a Pharmacist  A complete drug regimen review was completed for this patient to identify any potential clinically significant medication issues.  High Risk Drug Classes Is patient taking? Indication by Medication  Antipsychotic No   Anticoagulant No   Antibiotic No   Opioid Yes Tramadol : moderate pain  Antiplatelet Yes Aspirin , clopidogrel : CAD, s/p CABG  Hypoglycemics/insulin  Yes Aspart, glargine: DM  Vasoactive Medication Yes   Chemotherapy No   Other Yes Atorvastatin : HLD Lidocaine  patch, acetaminophen : pain Magic mouthwash: pain, infection Sucroferric oxyhydroxide: Phos binder, anemia Pantoprazole : reflux  Miralax , senna-docusate: constipation Zolpidem : sleep Albuterol : SOB Epinephrine : allergic reaction Meclizine: dizziness NaCl nasal: congestion Santyl , Gerhardt's butt cream: sacral wound care Renal MVI, vit D: supplement     Type of Medication Issue Identified Description of Issue Recommendation(s)  Drug Interaction(s) (clinically significant)     Duplicate Therapy     Allergy     No Medication Administration End Date     Incorrect Dose     Additional Drug Therapy Needed     Significant med changes from prior encounter (inform family/care partners about these prior to discharge). PTA medications stopped on inpatient discharge: furosemide , hydrochlorothiazide , sertraline, lispro, meclizine, losartan , valacyclovir, carvedilol , hydralazine , clonidine , Voltaren gel, fenofibrate , isosorbide  MN   Other       Clinically significant medication issues were identified that warrant physician communication and completion of prescribed/recommended actions by midnight of the next day:  No  Name of provider notified for urgent issues identified:   Provider Method of Notification:     Pharmacist comments:    Time spent performing this drug regimen review (minutes):  30    Rocky Slade, PharmD, BCPS 02/02/2024  7:45 PM

## 2024-02-02 NOTE — Progress Notes (Addendum)
 "                                                        PROGRESS NOTE   Subjective/Complaints:  No new complaints or concerns this morning.  Reports he slept better yesterday.  LBM yesterday.  Seen by [facility] nurse for sacral wound.  ROS: dizziness- improved, fatigue improved Per HPI  Pt denies  fevers, HA, SOB, abd pain, CP, N/V/D, and vision changes    Objective:   No results found.   Recent Labs    01/31/24 0532 02/02/24 0516  WBC 9.9 10.0  HGB 7.9* 8.0*  HCT 24.6* 25.0*  PLT 330 328   Recent Labs    01/31/24 0532 02/02/24 0516  NA 132* 133*  K 4.5 4.6  CL 94* 96*  CO2 27 27  GLUCOSE 67* 114*  BUN 49* 50*  CREATININE 5.75* 5.70*  CALCIUM  9.1 9.1     Intake/Output Summary (Last 24 hours) at 02/02/2024 1110 Last data filed at 02/02/2024 0700 Gross per 24 hour  Intake 710 ml  Output 800 ml  Net -90 ml     Wound 01/18/24 1604 Pressure Injury Sacrum Unstageable - Full thickness tissue loss in which the base of the injury is covered by slough (yellow, tan, gray, green or brown) and/or eschar (tan, brown or black) in the wound bed. (Active)    Physical Exam: Vital Signs Blood pressure (!) 143/44, pulse 68, temperature 97.7 F (36.5 C), resp. rate 18, height 6' (1.829 m), weight 131.7 kg, SpO2 95%.    General: awake, alert, appropriate, sitting up in bed, no acute distress HENT: conjugate gaze; oropharynx moist CV: regular rate and rhythm; no JVD Pulmonary: CTA B/L; non-labored GI: soft, NT, ND, (+)BS Psychiatric: appropriate, cooperative Neurological: alert and awake, pleasant Skin- sternal incision-CDI Extremities: 1+ LE edema b/l LEs Neuro: Alert and oriented x3, decreased sensation in bilateral feet. Moving all 4 extremities to gravity and resistance  Assessment/Plan: 1. Functional deficits which require 3+ hours per day of interdisciplinary therapy in a comprehensive inpatient rehab setting. Physiatrist is providing close team supervision and 24 hour  management of active medical problems listed below. Physiatrist and rehab team continue to assess barriers to discharge/monitor patient progress toward functional and medical goals  Care Tool:  Bathing    Body parts bathed by patient: Chest, Left arm, Right arm, Abdomen, Front perineal area, Right upper leg, Left upper leg, Face, Buttocks, Right lower leg, Left lower leg   Body parts bathed by helper: Right lower leg, Left lower leg, Buttocks     Bathing assist Assist Level: Independent with assistive device     Upper Body Dressing/Undressing Upper body dressing   What is the patient wearing?: Pull over shirt    Upper body assist Assist Level: Independent with assistive device    Lower Body Dressing/Undressing Lower body dressing      What is the patient wearing?: Pants, Underwear/pull up     Lower body assist Assist for lower body dressing: Independent with assitive device     Toileting Toileting    Toileting assist Assist for toileting: Independent with assistive device     Transfers Chair/bed transfer  Transfers assist     Chair/bed transfer assist level: Independent with assistive device Chair/bed transfer assistive device: Armrests, Environmental Health Practitioner  Ambulation   Ambulation assist      Assist level: Independent with assistive device Assistive device: Rollator Max distance: 250'   Walk 10 feet activity   Assist     Assist level: Independent with assistive device Assistive device: Rollator   Walk 50 feet activity   Assist Walk 50 feet with 2 turns activity did not occur: Safety/medical concerns (fatigue, SOB, dizziness, weakness/deconditioning)  Assist level: Independent with assistive device Assistive device: Rollator    Walk 150 feet activity   Assist Walk 150 feet activity did not occur: Safety/medical concerns (fatigue, SOB, dizziness, weakness/deconditioning)  Assist level: Independent with assistive device Assistive device:  Rollator    Walk 10 feet on uneven surface  activity   Assist Walk 10 feet on uneven surfaces activity did not occur: Safety/medical concerns (fatigue, SOB, dizziness, weakness/deconditioning)   Assist level: Independent with assistive device Assistive device: Rollator   Wheelchair     Assist Is the patient using a wheelchair?: No Type of Wheelchair: Manual    Wheelchair assist level: Dependent - Patient 0% Max wheelchair distance: 180ft    Wheelchair 50 feet with 2 turns activity    Assist        Assist Level: Dependent - Patient 0%   Wheelchair 150 feet activity     Assist      Assist Level: Dependent - Patient 0%   Blood pressure (!) 143/44, pulse 68, temperature 97.7 F (36.5 C), resp. rate 18, height 6' (1.829 m), weight 131.7 kg, SpO2 95%.  Medical Problem List and Plan: 1. 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  - Expected discharge tomorrow   2. Pain: d/c oxycodone , continue tramadol  for moderate pain.              --Continue lidocaine  patch to chest wall for local measures  12/31 pain controlled, reports tramadol  helping  3. Therapy: TEDs/Binder as continues to have BP drop but asymptomatic per OT notes -increase midodrine  to 15mg  TID, continue this dose, discussed that dizziness has improved - 12/29 patient has had a couple elevated BP readings, we will decrease midodrine  back to 10 mg 3 times daily for hypertension -12/30 will decrease midodrine  to 7.5mg  TID -12/31 decrease midodrine  to 5 mg 3 times daily -1/2 discontinue midodrine     02/02/2024    9:08 AM 02/02/2024    8:00 AM 02/02/2024    6:30 AM  Vitals with BMI  Systolic  143 167 188  Diastolic 44 55 61  Pulse 68 61 73     6. 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   -sternal incision assessed and is healing well  7.  T2DM: Hgb A1c- 9.9. Monitor BS ac/hs, Add CM restrictions to diet. Used Lantus  50 units PTA  -decrease aspart to 6U with meals due to hypoglycemia. D/c ISS   CBG (last 3)  Recent Labs    02/01/24 2044 02/01/24 2145 02/02/24 0632  GLUCAP 67*  84 117*   Will reduce lantus  to 22U, continue this dose  12/27- will reduce Lantus  to 20 units since dropped to 68 this AM- also attempted to order night snack, although required a comment to get it in the note  12/28- Pt's BG's looking better in last 24 hours- con't regimen-   -12/30 decrease lantus  to 17 units to avoid hypoglycemia  -12/31 Decrease lantus  to 13 units due to hypoglycemia  -1/1 decrease lantus  to 10u to avoid hypoglycemia  -1/2 episode of mild hypoglycemia last night.  Mealtime insulin  dinner dose discontinued.  Will also decrease Lantus  to 7 units twice daily.  8. ESRD: Off CRRT and back on MWF HD schedule. Continue fluid restriction --schedule therapy at the end of the day to help with tolerance of therapy.  12/28- LE edema a little worse- weight UP 1 LB- will order ACE wraps- don't think TEDs would fit-  Nephrology note reviewed, they are aware of discharge tomorrow 9.  Acute on chronic anemia: Hgb reviewed and was 9 on 12/9. On Epo Reviewed stool occult- one was positive and one was negative, Hgb did not improve as much as expected with 1U PRBCs on 12/25- CT abdomen ordered and was negative for acute bleed. Hgb reviewed 12/26 and improved. Heparin  decreased to q12H  12/27- Hb 7.8- about his normal last 7 days or so- running 7.0-8.0  -on Aranesp   FOBT ordered by nephrology-pending, will send message to nursing to check next BM- negative on 02/01/24  HGB 8.0 today 1/2 10. Leucocytosis: Has had leucocytosis which  continues to fluctuate             --12.2 at admission    1/2 within normal limits at 10.0     Latest Ref Rng & Units 02/02/2024    5:16 AM 01/31/2024    5:32 AM 01/29/2024    5:13 AM  CBC  WBC 4.0 - 10.5 K/uL 10.0  9.9  10.2   Hemoglobin 13.0 - 17.0 g/dL 8.0  7.9  7.3   Hematocrit 39.0 - 52.0 % 25.0  24.6  22.8   Platelets 150 - 400 K/uL 328  330  358      11. Constipation: Change dulcolax to senna S 2 bid. LBM 12/25- d/c suppository  12/27- LBM yesterday- type 6  12/29-hard BM documented yesterday, add MiraLAX   12/30 LBM yesterday, monitor   12/31 increase miralax  to BID  1/2 LBM yesterday 12.  Low protein stores: Albumin  reviewed and is improving, d/c Juven  13. Odynophagia: continue MMW, messaged nursing to see if this is still an issue  14. Class 2 Obesity: BMI- 37.4 on 12/20 Educate on diet and exercise to promote overall health and mobility.   12/28- BMI up to 40.48 today- large jump considering his fluid status/ESRD- will d/w renal  12/28- per pt and nursing, sticking to 1200cc/day of fluid restriction- in spite of Na down to 128 15. Atelectasis: continue flutter valve.  16. Hyponatremia  12/28- Na 128- running 128- 136 in last 1 week- will d/w renal  12/28- renal felt pt might not be sticking to 1200cc/day- per staff and pt, he is- will recheck BMP today  17.  Sacral wound.  - Seen by WOC today.Recommending hydrotherapy with PT-Will message WOC regarding patient discharge tomorrow/recommendations.-Recommending surgical consult for debridement-Will contact surgery regarding this and follow-up with outpatient wound care center-social work notified.   Addendum Spoke with surgery team-did not think surgical debridement beneficial at this time- f/u wound clinic.  LOS: 15 days A FACE TO FACE EVALUATION WAS PERFORMED  Murray Collier 02/02/2024, 11:10 AM     "

## 2024-02-02 NOTE — Progress Notes (Addendum)
 Pt unavailable for AM line care [phone]- pt returning to room- AM line care done

## 2024-02-02 NOTE — Plan of Care (Signed)
" °  Problem: Education: Goal: Ability to describe self-care measures that may prevent or decrease complications (Diabetes Survival Skills Education) will improve Outcome: Progressing Goal: Individualized Educational Video(s) Outcome: Progressing   Problem: Coping: Goal: Ability to adjust to condition or change in health will improve Outcome: Progressing   Problem: Fluid Volume: Goal: Ability to maintain a balanced intake and output will improve Outcome: Progressing   Problem: Health Behavior/Discharge Planning: Goal: Ability to identify and utilize available resources and services will improve Outcome: Progressing Goal: Ability to manage health-related needs will improve Outcome: Progressing   Problem: Metabolic: Goal: Ability to maintain appropriate glucose levels will improve Outcome: Progressing   Problem: Nutritional: Goal: Maintenance of adequate nutrition will improve Outcome: Progressing Goal: Progress toward achieving an optimal weight will improve Outcome: Progressing   Problem: Skin Integrity: Goal: Risk for impaired skin integrity will decrease Outcome: Progressing   Problem: Tissue Perfusion: Goal: Adequacy of tissue perfusion will improve Outcome: Progressing   Problem: Consults Goal: RH STROKE PATIENT EDUCATION Description: See Patient Education module for education specifics  Outcome: Progressing Goal: Nutrition Consult-if indicated Outcome: Progressing Goal: Diabetes Guidelines if Diabetic/Glucose > 140 Description: If diabetic or lab glucose is > 140 mg/dl - Initiate Diabetes/Hyperglycemia Guidelines & Document Interventions  Outcome: Progressing   Problem: RH BOWEL ELIMINATION Goal: RH STG MANAGE BOWEL WITH ASSISTANCE Description: STG Manage Bowel with Assistance. Outcome: Progressing Goal: RH STG MANAGE BOWEL W/MEDICATION W/ASSISTANCE Description: STG Manage Bowel with Medication with Assistance. Outcome: Progressing   Problem: RH SAFETY Goal:  RH STG ADHERE TO SAFETY PRECAUTIONS W/ASSISTANCE/DEVICE Description: STG Adhere to Safety Precautions With Assistance/Device. Outcome: Progressing   Problem: RH PAIN MANAGEMENT Goal: RH STG PAIN MANAGED AT OR BELOW PT'S PAIN GOAL Outcome: Progressing   Problem: RH KNOWLEDGE DEFICIT Goal: RH STG INCREASE KNOWLEDGE OF DIABETES Outcome: Progressing Goal: RH STG INCREASE KNOWLEDGE OF HYPERTENSION Outcome: Progressing Goal: RH STG INCREASE KNOWLEGDE OF HYPERLIPIDEMIA Outcome: Progressing Goal: RH STG INCREASE KNOWLEDGE OF STROKE PROPHYLAXIS Outcome: Progressing   "

## 2024-02-02 NOTE — Progress Notes (Addendum)
 Patient ID: Timothy Holloway, male   DOB: August 14, 1955, 69 y.o.   MRN: [id number]  SW reached out to [employer] so they can contact patient/family to collect the co-pay for items.   UPDATE:  No response from [employer] - put in a request to cancel order  Reached out to [employer] to see if they could provide items for patient   UPDATE:   [employer] able to provide DME

## 2024-02-02 NOTE — Plan of Care (Signed)
°  Problem: RH Balance °Goal: LTG Patient will maintain dynamic standing with ADLs (OT) °Description: LTG:  Patient will maintain dynamic standing balance with assist during activities of daily living (OT)  °Outcome: Completed/Met °  °Problem: Sit to Stand °Goal: LTG:  Patient will perform sit to stand in prep for activites of daily living with assistance level (OT) °Description: LTG:  Patient will perform sit to stand in prep for activites of daily living with assistance level (OT) °Outcome: Completed/Met °  °Problem: RH Bathing °Goal: LTG Patient will bathe all body parts with assist levels (OT) °Description: LTG: Patient will bathe all body parts with assist levels (OT) °Outcome: Completed/Met °  °Problem: RH Dressing °Goal: LTG Patient will perform upper body dressing (OT) °Description: LTG Patient will perform upper body dressing with assist, with/without cues (OT). °Outcome: Completed/Met °Goal: LTG Patient will perform lower body dressing w/assist (OT) °Description: LTG: Patient will perform lower body dressing with assist, with/without cues in positioning using equipment (OT) °Outcome: Completed/Met °  °Problem: RH Toileting °Goal: LTG Patient will perform toileting task (3/3 steps) with assistance level (OT) °Description: LTG: Patient will perform toileting task (3/3 steps) with assistance level (OT)  °Outcome: Completed/Met °  °Problem: RH Toilet Transfers °Goal: LTG Patient will perform toilet transfers w/assist (OT) °Description: LTG: Patient will perform toilet transfers with assist, with/without cues using equipment (OT) °Outcome: Completed/Met °  °

## 2024-02-02 NOTE — Telephone Encounter (Signed)
 Attempted to contact pt's wife 1/2. Voicemail not set up. Unable to leave voicemail.

## 2024-02-02 NOTE — Progress Notes (Signed)
 Occupational Therapy Session Note  Patient Details  Name: Timothy Holloway MRN: [id number] Date of Birth: 02-Jan-1956  Today's Date: 02/02/2024 OT Individual Time: 9054-8944 OT Individual Time Calculation (min): 70 min    Short Term Goals: Week 2:  OT Short Term Goal 1 (Week 2): STG=LTG due to LOS  Skilled Therapeutic Interventions/Progress Updates:     Pt received resting in bed, dressed and ready for the day upon OT arrival politely declining need for ADLs. Pt presenting to be in good spirits receptive to skilled OT session reporting 0/10 pain- OT offering intermittent rest breaks, repositioning, and therapeutic support to optimize participation in therapy session. Focused this session on endurance training, Pt education, and rollator management skills.   Pt completed functional mobility to therapy gym using rollator MOD I for endurance training completing ~120ft with seated rest break following.   Engaged Pt in completing simulated household navigation activity using rollator to challenge endurance, safety awareness with rollator, and application to sternal precautions. Pt able to ambulate ~6 min during activity and retrieve items from low/high surfaces while adhering to sternal precautions and recalling need to lock breaks on rollator prior to reach with 100% accuracy MOD I. Prolonged seated rest break following 2/2 fatigue.   Educated Pt on energy conservation technique and fall prevention to increase safety at d/c. Engaged Pt in discussion applying taught techniques to Pt's daily life with Pt engaged in conversation and demonstrating teach back as evidence of learning.   Pt participated in bean bag toss dynamic standing balance activity to challenge endurance, reaching outside BOS, dynamic standing balance, and B LE strength to increase overall safety and independence in ADLs and IADLs. Blocked practice of sit<>stands incorporated into activity with Pt instructed to complete sit<>stand  between each rep. He completed total of 3x10 reps with distant SUP provided for safety and prolonged seated rest breaks provided between trials.    Engaged Pt in completing 4 bouts of functional mobility using rollator to simulate navigating in the community using rollator, navigating around obstacles, and taking rest breaks PRN. Pt demonstrating appropriate insight into his deficits and able to demonstrate safe technique for turning to sit on rollator for rest breaks between each bout of functional mobility.   Pt fatigued at end of session, requesting to return to bed. Pt was left resting in bed with call bell in reach and all needs met. No alarm placed as therapy team has made Pt MOD I in the room and he is safe to ambulate independently.   Therapy Documentation Precautions:  Precautions Precautions: Fall, Sternal Precaution Booklet Issued: No Recall of Precautions/Restrictions: Impaired Precaution/Restrictions Comments: chronic low BP Restrictions Weight Bearing Restrictions Per Provider Order: No Other Position/Activity Restrictions: Sternal precautions   Therapy/Group: Individual Therapy  Katheryn SHAUNNA [place]ines 02/02/2024, 7:24 AM

## 2024-02-02 NOTE — Progress Notes (Signed)
 Physical Therapy Session Note  Patient Details  Name: Timothy Holloway MRN: [id number] Date of Birth: 07-Oct-1955  Today's Date: 02/02/2024 PT Individual Time: 9199-9143 + 8884-8842 PT Individual Time Calculation (min): 56 min  + 42 min  Short Term Goals: Week 2:  PT Short Term Goal 1 (Week 2): STG= LTG  Skilled Therapeutic Interventions/Progress Updates:      1st session: Pt sitting EOB to start session. Agreeable to therapy treatment. Pt made modified independent in the room - nursing made aware via secure chat and sign hung outside his door. Pt has no c/o pain.   BP checked due to elevated BP from earlier this AM. 167/55 HR 61. Deferred donning compression socks given elevated BP.  Pt requesting to use the bathroom before leaving his room. Sit<>stand to rollator mod I from EOB. Ambulates mod I in his room to the bathroom where he prefers to sit to urinate, continent of bladder void.   Pt ambulates mod I using the rollator from his room to the main gym 150'. Seated rest break provided before beginning 6 Minute Walk Test. Pt covered a total distance of 475' in the 6 minutes. He needed a 1 minute seated rest break with 2:30 minutes left.   Practiced mADLs in apartment suite - furniture transfers, navigating around tight spaces with rollator, and through the kitchen. Completed activities at mod I level while educating him on safety techniques to prevent falls risk and better maximize his functional independence. We also discussed DC planning and follow up therapy recommendations OP PT vs cardiac rehab. Determined patient would best be fitted for cardiac rehab to build cardiovascular endurance, build heart health, improve well-being, and promote lifestyle change. Pt agreeable to this so relayed to CSW and MD.   Provided stair training for endurance building and improving activity tolerance. He completed 2x4 (6) steps with CGA using 2 hand rails. Pt reporting feeling exhausted after  completing 1 set to seated rest break provided b/w bouts.   Pt returned to his room and was left sitting EOB, needs met.    2nd session: Pt sitting EOB to start - reports high fatigue level from busy morning of therapy sessions, requests light activity only.   Sit<>stand to rollator mod I from bed. Ambulates mod I level to ortho gym, ~125' with the rollator.   Pt provided with handout of HEP. Verbally reviewed these with patient and he voiced understanding. HEP handout placed in his patient binder in his room .  Access Code: [email or web address] URL: [email or web address] Date: 02/02/2024 Prepared by: Sherlean Perks  Exercises - Sit to Stand with Counter Support  - 1 x daily - 7 x weekly - 3 sets - 10 reps - Standing March with Counter Support  - 1 x daily - 7 x weekly - 3 sets - 10 reps - Standing Hip Abduction with Counter Support  - 1 x daily - 7 x weekly - 3 sets - 10 reps - Heel Raises with Counter Support  - 1 x daily - 7 x weekly - 3 sets - 10 reps - Mini Squat with Counter Support  - 1 x daily - 7 x weekly - 3 sets - 10 reps - Side Stepping with Counter Support  - 1 x daily - 7 x weekly - 3 sets - 10 reps  Pt assisted onto Nustep to work on his cardiovascular endurance and strength building. He completed 2x5 minutes at L1 resistance using the visual path program to help distract him from  his fatigue. Pt reporting feeling exhausted after completion, needing several minute rest break for recovery.   Pt returned to his room at completion of session, was left sitting EOB with needs met.    Therapy Documentation Precautions:  Precautions Precautions: Fall, Sternal Precaution Booklet Issued: No Recall of Precautions/Restrictions: Impaired Precaution/Restrictions Comments: chronic low BP Restrictions Weight Bearing Restrictions Per Provider Order: No Other Position/Activity Restrictions: Sternal precautions General:       Therapy/Group: Individual  Therapy  Sherlean SHAUNNA Perks 02/02/2024, 7:42 AM

## 2024-02-02 NOTE — Progress Notes (Signed)
 Physical Therapy Discharge Summary  Patient Details  Name: Timothy Holloway MRN: [id number] Date of Birth: 29-Sep-1955  Date of Discharge from PT service:February 02, 2024   Patient has met 7 of 8 long term goals due to improved activity tolerance, improved balance, improved postural control, increased strength, and ability to compensate for deficits.  Patient to discharge at an ambulatory level Modified Independent.   Patient's care partner is independent to provide the necessary physical assistance at discharge.  Reasons goals not met: Pt needing minA to navigate threshold/step due to rollator management  Recommendation:  Patient will benefit from ongoing skilled PT services in Cardiac Rehab  to continue to advance safe functional mobility, address ongoing impairments in BLE weakness, activity tolerance, dynamic standing balance, and minimize fall risk. Pt will benefit from Cardiac Rehab to improve cardiovascular endurance, build heart health, promote lifestyle change and improve overall well-being.  Equipment: Rollator  Reasons for discharge: treatment goals met and discharge from hospital  Patient/family agrees with progress made and goals achieved: Yes  PT Discharge Precautions/Restrictions Precautions Precautions: Fall;Sternal Precaution/Restrictions Comments: chronic low BP; dialysis MWF Restrictions Weight Bearing Restrictions Per Provider Order: No Pain Interference Pain Interference Pain Effect on Sleep: 1. Rarely or not at all Pain Interference with Therapy Activities: 1. Rarely or not at all Pain Interference with Day-to-Day Activities: 1. Rarely or not at all Vision/Perception  Vision - History Ability to See in Adequate Light: 0 Adequate Vision - Assessment Eye Alignment: Within Functional Limits Perception Perception: Within Functional Limits Praxis Praxis: WFL  Cognition Overall Cognitive Status: Within Functional Limits for tasks  assessed Arousal/Alertness: Awake/alert Orientation Level: Oriented X4 Attention: Selective Selective Attention: Appears intact Memory: Appears intact Awareness: Appears intact Problem Solving: Appears intact Safety/Judgment: Appears intact Comments: flat affect Sensation Sensation Light Touch: Appears Intact Hot/Cold: Appears Intact Proprioception: Appears Intact Stereognosis: Appears Intact Coordination Gross Motor Movements are Fluid and Coordinated: No Coordination and Movement Description: global weakness/deconditioning, fatigue/SOB, decreased balance/coordination, and difficulty adhering to sternal precautions Motor  Motor Motor: Within Functional Limits Motor - Discharge Observations: Generalized weakness - significantly improved from admission  Mobility Bed Mobility Bed Mobility: Rolling Right;Rolling Left;Supine to Sit;Sit to Supine Rolling Right: Independent Rolling Left: Independent Supine to Sit: Independent Sit to Supine: Independent Transfers Transfers: Sit to Stand;Stand to Sit;Stand Pivot Transfers Sit to Stand: Independent with assistive device Stand to Sit: Independent with assistive device Stand Pivot Transfers: Independent with assistive device Transfer (Assistive device): 4-wheeled walker Locomotion  Gait Ambulation: Yes Gait Assistance: Independent with assistive device Gait Distance (Feet): 250 Feet Assistive device: 4-wheeled walker Gait Gait: Yes Gait Pattern: Within Functional Limits Gait velocity: reduced - 0.67m/s Stairs / Additional Locomotion Stairs: Yes Stairs Assistance: Contact Guard/Touching assist Stair Management Technique: Two rails;Step to pattern;Forwards Number of Stairs: 4 Height of Stairs: 6 Pick up small object from the floor assist level: Minimal Assistance - Patient > 75% Wheelchair Mobility Wheelchair Mobility: No  Trunk/Postural Assessment  Cervical Assessment Cervical Assessment: Within Functional Limits Thoracic  Assessment Thoracic Assessment: Within Functional Limits Lumbar Assessment Lumbar Assessment: Within Functional Limits Postural Control Postural Control: Deficits on evaluation Righting Reactions: mild posterior bias in unsupported standing, slightly delayed righting reaction  Balance Balance Balance Assessed: Yes Static Sitting Balance Static Sitting - Balance Support: Feet supported;No upper extremity supported Static Sitting - Level of Assistance: 7: Independent Dynamic Sitting Balance Dynamic Sitting - Balance Support: Feet supported;No upper extremity supported;During functional activity Dynamic Sitting - Level of Assistance: 7: Independent Static Standing Balance  Static Standing - Balance Support: During functional activity;Right upper extremity supported;Left upper extremity supported Static Standing - Level of Assistance: 6: Modified independent (Device/Increase time) Dynamic Standing Balance Dynamic Standing - Balance Support: Right upper extremity supported;Left upper extremity supported;During functional activity Dynamic Standing - Level of Assistance: 6: Modified independent (Device/Increase time) Extremity Assessment      RLE Assessment RLE Assessment: Within Functional Limits LLE Assessment LLE Assessment: Within Functional Limits   Romir Klimowicz P Krzysztof Reichelt  PT, DPT, CSRS  02/02/2024, 7:48 AM

## 2024-02-02 NOTE — Progress Notes (Signed)
 " [facility] Progress Note   Subjective:    Last HD on 12/31 with 3 kg UF.  800 mL UOP over 1/1 and four unmeasured urine voids.  Pt states plan is for home tomorrow.  His midodrine  was stopped due to HTN.     Review of systems:   Denies shortness of breath or chest pain Denies n/v  Denies dizziness or cramping  Objective Vitals:   02/02/24 0500 02/02/24 0630 02/02/24 0800 02/02/24 0908  BP:  (!) 188/61 (!) 167/55 (!) 143/44  Pulse:  73 61 68  Resp:  17 18   Temp:  97.7 F (36.5 C)    TempSrc:      SpO2:  98%  95%  Weight: 131.7 kg     Height:       Physical Exam    General adult male in bed in no acute distress HEENT normocephalic atraumatic extraocular movements intact sclera anicteric Neck supple trachea midline Lungs clear to auscultation bilaterally normal work of breathing at rest on room air Heart S1S2 midline incision healing;  Abdomen soft nontender obese habitus/distended Extremities trace to 1+ edema   Psych normal mood and affect Dialysis Access: [facility] in R chest  Additional Objective Labs: Basic Metabolic Panel: Recent Labs  Lab 01/26/24 1412 01/28/24 1057 01/29/24 0513 01/31/24 0532 02/02/24 0516  NA 128*   < > 132* 132* 133*  K 4.3   < > 4.4 4.5 4.6  CL 92*   < > 95* 94* 96*  CO2 24   < > 26 27 27   GLUCOSE 137*   < > 107* 67* 114*  BUN 63*   < > 56* 49* 50*  CREATININE 6.72*   < > 6.52* 5.75* 5.70*  CALCIUM  8.6*   < > 8.7* 9.1 9.1  PHOS 4.2  --   --  3.9 3.8   < > = values in this interval not displayed.   Liver Function Tests: Recent Labs  Lab 01/26/24 1412 01/31/24 0532 02/02/24 0516  ALBUMIN  3.2* 3.4* 3.4*   CBC: Recent Labs  Lab 01/27/24 0626 01/29/24 0513 01/31/24 0532 02/02/24 0516  WBC 9.2 10.2 9.9 10.0  NEUTROABS 6.2 6.6  --   --   HGB 7.8* 7.3* 7.9* 8.0*  HCT 24.4* 22.8* 24.6* 25.0*  MCV 92.8 93.8 92.5 94.7  PLT 363 358 330 328   Medications:   aspirin  EC  81 mg Oral Daily   atorvastatin   80 mg Oral  Daily   Chlorhexidine  Gluconate Cloth  6 each Topical Q12H   Chlorhexidine  Gluconate Cloth  6 each Topical Q0600   cholecalciferol   1,000 Units Oral Daily   clopidogrel   75 mg Oral Daily   collagenase    Topical Daily   darbepoetin (ARANESP ) injection - DIALYSIS  150 mcg Subcutaneous Q Thu-1800   Gerhardt's butt cream   Topical Daily   heparin   5,000 Units Subcutaneous Q12H   insulin  aspart  5 Units Subcutaneous BID WC   insulin  glargine  7 Units Subcutaneous BID   lidocaine   2 patch Transdermal Q24H   magic mouthwash  5 mL Oral QID   multivitamin  1 tablet Oral QHS   pantoprazole   40 mg Oral Daily   polyethylene glycol  17 g Oral BID   senna-docusate  2 tablet Oral BID   sodium chloride  flush  10-40 mL Intracatheter Q12H   sucroferric oxyhydroxide  500 mg Oral TID WC   zolpidem   5 mg Oral QHS  Dialysis Orders MWF - [facility] 4h B350  133.5kg  TDC  Heparin  2000+ 1400u/hr Calcitriol 1.25 three times per week Mircera 90 mcg q 2 wks (last 12/1)  CXR 12/11-> resolved vasc congestion, improved IS edema   Assessment/Plan: ESRD - usual HD schedule MWF. CRRT used from 12/10 - 12/13 for BP stability in the face of stroke-like symptoms. HD per MWF schedule BP/volume - midodrine  now off due to hypertension.  UF as tolerated. Anemia of ESRD: Continue Aranesp  150mcg q Thursday, s/p 1U PRBCs on 12/24, Hgb trending down again.FOBT negative on 1/1 - had been positive 12/24.  Secondary HPTH: Ca/Phos ok - continue Velphoro  NSTEMI - 3V CAD by LHC -> s/p CABG 01/08/24 Stroke-like symptoms: was waxing/ waning per neuro. There was no acute CVA by imaging, but due to advanced carotid disease by CTA pt underwent L carotid artery stent per [facility] 12/10.   Disposition per primary team.  Patient states discharge is planned for this Saturday   Katheryn JAYSON Saba, MD 02/02/2024, 2:19 PM [facility] "

## 2024-02-03 ENCOUNTER — Other Ambulatory Visit (HOSPITAL_COMMUNITY): Payer: Self-pay

## 2024-02-03 DIAGNOSIS — I10 Essential (primary) hypertension: Secondary | ICD-10-CM

## 2024-02-03 LAB — GLUCOSE, CAPILLARY
Glucose-Capillary: 146 mg/dL — ABNORMAL HIGH (ref 70–99)
Glucose-Capillary: 128 mg/dL — ABNORMAL HIGH (ref 70–99)

## 2024-02-03 NOTE — Plan of Care (Signed)

## 2024-02-03 NOTE — Progress Notes (Addendum)
 " [facility] Progress Note   Subjective:   Seen in room. Plan is for discharge today. No CP/dizziness/dyspnea.  Objective Vitals:   02/02/24 1945 02/02/24 2008 02/03/24 0500 02/03/24 0617  BP: 131/74 (!) 143/66  (!) 109/38  Pulse: 81 76  68  Resp: 18 17  18   Temp: 98.1 F (36.7 C) 98.5 F (36.9 C)  98.7 F (37.1 C)  TempSrc:  Oral  Oral  SpO2: 100% 100%  95%  Weight: (S) 128.1 kg  127.7 kg   Height:       Physical Exam General: Well appearing, NAD. Room air Heart: RRR Lungs: CTAB, no rales Abdomen: soft Extremities: 1+ BLE edema Dialysis Access: [facility]  Additional Objective Labs: Basic Metabolic Panel: Recent Labs  Lab 01/29/24 0513 01/31/24 0532 02/02/24 0516  NA 132* 132* 133*  K 4.4 4.5 4.6  CL 95* 94* 96*  CO2 26 27 27   GLUCOSE 107* 67* 114*  BUN 56* 49* 50*  CREATININE 6.52* 5.75* 5.70*  CALCIUM  8.7* 9.1 9.1  PHOS  --  3.9 3.8   Liver Function Tests: Recent Labs  Lab 01/31/24 0532 02/02/24 0516  ALBUMIN  3.4* 3.4*   CBC: Recent Labs  Lab 01/29/24 0513 01/31/24 0532 02/02/24 0516  WBC 10.2 9.9 10.0  NEUTROABS 6.6  --   --   HGB 7.3* 7.9* 8.0*  HCT 22.8* 24.6* 25.0*  MCV 93.8 92.5 94.7  PLT 358 330 328   Medications:   aspirin  EC  81 mg Oral Daily   atorvastatin   80 mg Oral Daily   Chlorhexidine  Gluconate Cloth  6 each Topical Q12H   Chlorhexidine  Gluconate Cloth  6 each Topical Q0600   cholecalciferol   1,000 Units Oral Daily   clopidogrel   75 mg Oral Daily   collagenase    Topical Daily   darbepoetin (ARANESP ) injection - DIALYSIS  150 mcg Subcutaneous Q Thu-1800   Gerhardt's butt cream   Topical Daily   heparin   5,000 Units Subcutaneous Q12H   insulin  aspart  5 Units Subcutaneous BID WC   insulin  glargine  7 Units Subcutaneous BID   lidocaine   2 patch Transdermal Q24H   magic mouthwash  5 mL Oral QID   multivitamin  1 tablet Oral QHS   pantoprazole   40 mg Oral Daily   polyethylene glycol  17 g Oral BID    senna-docusate  2 tablet Oral BID   sodium chloride  flush  10-40 mL Intracatheter Q12H   sucroferric oxyhydroxide  500 mg Oral TID WC   zolpidem   5 mg Oral QHS    Dialysis Orders MWF - [facility] 4h B350  133.5kg  TDC  Heparin  2000+ 1400u/hr Calcitriol 1.25 three times per week Mircera 90 mcg q 2 wks (last 12/1)  CXR 12/11-> resolved vasc congestion, improved IS edema   Assessment/Plan: ESRD - Continue HD on MWF. CRRT used from 12/10 - 12/13 for BP stability in the face of stroke-like symptoms. BP/volume: Was on mido, now on hold due to HTN, follow. Below prior EDW - change to 127.5kg.  Anemia of ESRD: Continue Aranesp  150mcg q Thursday, s/p 1U PRBCs on 12/24, Hgb 8 - stable. Secondary HPTH: Ca/Phos ok - continue Velphoro  NSTEMI - 3V CAD by LHC -> s/p CABG 01/08/24 Stroke-like symptoms: was waxing/ waning per neuro. There was no acute CVA by imaging, but due to advanced carotid disease by CTA pt underwent L carotid artery stent per [facility] 12/10.  Dispo: Being discharged home today - to return to  his home dialysis unit on Monday 02/05/24.   Izetta Boehringer, PA-C 02/03/2024, 11:32 AM  [facility]    "

## 2024-02-03 NOTE — Progress Notes (Signed)
 "                                                        PROGRESS NOTE   Subjective/Complaints:  Pt doing well, slept well, denies pain, LBM 2d ago which is normal for him when he has a large BM (which he did, the last time he had one). Urinating per his normal amount, dialysis yesterday went ok but definitely tired today--normal. No other complaints or concerns.   ROS: dizziness- improved, fatigue improved Per HPI  Pt denies  fevers, HA, SOB, abd pain, CP, N/V/D, and vision changes    Objective:   No results found.   Recent Labs    02/02/24 0516  WBC 10.0  HGB 8.0*  HCT 25.0*  PLT 328   Recent Labs    02/02/24 0516  NA 133*  K 4.6  CL 96*  CO2 27  GLUCOSE 114*  BUN 50*  CREATININE 5.70*  CALCIUM  9.1     Intake/Output Summary (Last 24 hours) at 02/03/2024 1013 Last data filed at 02/03/2024 0800 Gross per 24 hour  Intake 622 ml  Output 3500 ml  Net -2878 ml     Wound 01/18/24 1604 Pressure Injury Sacrum Unstageable - Full thickness tissue loss in which the base of the injury is covered by slough (yellow, tan, gray, green or brown) and/or eschar (tan, brown or black) in the wound bed. (Active)    Physical Exam: Vital Signs Blood pressure (!) 109/38, pulse 68, temperature 98.7 F (37.1 C), temperature source Oral, resp. rate 18, height 6' (1.829 m), weight 127.7 kg, SpO2 95%.    General: awake, alert, appropriate, resting comfortably in bed, no acute distress HENT: conjugate gaze; oropharynx moist CV: regular rate and rhythm; no JVD Pulmonary: CTA B/L; non-labored GI: soft, NT, ND, (+)BS Psychiatric: appropriate, cooperative Neurological: alert and awake, pleasant Skin- sternal incision-CDI Extremities: trace LE edema b/l LEs Neuro: Alert and oriented x3, decreased sensation in bilateral feet. Moving all 4 extremities to gravity and resistance  Assessment/Plan: 1. Functional deficits which require 3+ hours per day of interdisciplinary therapy in a  comprehensive inpatient rehab setting. Physiatrist is providing close team supervision and 24 hour management of active medical problems listed below. Physiatrist and rehab team continue to assess barriers to discharge/monitor patient progress toward functional and medical goals  Care Tool:  Bathing    Body parts bathed by patient: Chest, Left arm, Right arm, Abdomen, Front perineal area, Right upper leg, Left upper leg, Face, Buttocks, Right lower leg, Left lower leg   Body parts bathed by helper: Right lower leg, Left lower leg, Buttocks     Bathing assist Assist Level: Independent with assistive device     Upper Body Dressing/Undressing Upper body dressing   What is the patient wearing?: Pull over shirt    Upper body assist Assist Level: Independent with assistive device    Lower Body Dressing/Undressing Lower body dressing      What is the patient wearing?: Pants, Underwear/pull up     Lower body assist Assist for lower body dressing: Independent with assitive device     Toileting Toileting    Toileting assist Assist for toileting: Independent with assistive device     Transfers Chair/bed transfer  Transfers assist     Chair/bed transfer assist level: Independent  with assistive device Chair/bed transfer assistive device: Armrests, Geologist, Engineering   Ambulation assist      Assist level: Independent with assistive device Assistive device: Rollator Max distance: 250'   Walk 10 feet activity   Assist     Assist level: Independent with assistive device Assistive device: Rollator   Walk 50 feet activity   Assist Walk 50 feet with 2 turns activity did not occur: Safety/medical concerns (fatigue, SOB, dizziness, weakness/deconditioning)  Assist level: Independent with assistive device Assistive device: Rollator    Walk 150 feet activity   Assist Walk 150 feet activity did not occur: Safety/medical concerns (fatigue, SOB,  dizziness, weakness/deconditioning)  Assist level: Independent with assistive device Assistive device: Rollator    Walk 10 feet on uneven surface  activity   Assist Walk 10 feet on uneven surfaces activity did not occur: Safety/medical concerns (fatigue, SOB, dizziness, weakness/deconditioning)   Assist level: Independent with assistive device Assistive device: Rollator   Wheelchair     Assist Is the patient using a wheelchair?: No Type of Wheelchair: Manual    Wheelchair assist level: Dependent - Patient 0% Max wheelchair distance: 184ft    Wheelchair 50 feet with 2 turns activity    Assist        Assist Level: Dependent - Patient 0%   Wheelchair 150 feet activity     Assist      Assist Level: Dependent - Patient 0%   Blood pressure (!) 109/38, pulse 68, temperature 98.7 F (37.1 C), temperature source Oral, resp. rate 18, height 6' (1.829 m), weight 127.7 kg, SpO2 95%.  Medical Problem List and Plan: 1. 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  - 02/03/24 d/c home today, pt aware of need to cancel cardiology appt Monday, and that wound clinic will call him. Paperwork reviewed with pt, nurse to review with family once they arrive.    2. Pain: d/c oxycodone , continue tramadol  for moderate pain.              --Continue lidocaine  patch to chest wall for local measures  12/31 pain controlled, reports tramadol  helping  3. Therapy: TEDs/Binder as continues to have BP drop but asymptomatic per OT notes -increase midodrine  to 15mg  TID, continue this dose, discussed that dizziness has improved - 12/29 patient has had a couple elevated BP readings, we  will decrease midodrine  back to 10 mg 3 times daily for hypertension -12/30 will decrease midodrine  to 7.5mg  TID -12/31 decrease midodrine  to 5 mg 3 times daily -1/2 discontinue midodrine -- BP stable 1/3    02/03/2024    6:17 AM 02/03/2024    5:00 AM 02/02/2024    8:08 PM  Vitals with BMI  Weight  281 lbs 8 oz   BMI  38.17   Systolic 109  143  Diastolic 38  66  Pulse 68  76     6. 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   -  sternal incision assessed and is healing well  7.  T2DM: Hgb A1c- 9.9. Monitor BS ac/hs, Add CM restrictions to diet. Used Lantus  50 units PTA  -decrease aspart to 6U with meals due to hypoglycemia. D/c ISS   CBG (last 3)  Recent Labs    02/02/24 1821 02/02/24 2134 02/03/24 0616  GLUCAP 121* 188* 146*   Will reduce lantus  to 22U, continue this dose  12/27- will reduce Lantus  to 20 units since dropped to 68 this AM- also attempted to order night snack, although required a comment to get it in the note  12/28- Pt's BG's looking better in last 24 hours- con't regimen-   -12/30 decrease lantus  to 17 units to avoid hypoglycemia  -12/31 Decrease lantus  to 13 units due to hypoglycemia  -1/1 decrease lantus  to 10u to avoid hypoglycemia  -1/2 episode of mild hypoglycemia last night.  Mealtime insulin  dinner dose discontinued.  Will also decrease Lantus  to 7 units twice daily.  -02/03/23 CBGs better, monitor outpatient  8. ESRD: Off CRRT and back on MWF HD schedule. Continue fluid restriction --schedule therapy at the end of the day to help with tolerance of therapy.  12/28- LE edema a little worse- weight UP 1 LB- will order ACE wraps- don't think TEDs would fit-  Nephrology note reviewed, they are aware of discharge tomorrow  9.  Acute on chronic anemia: Hgb reviewed and was 9 on 12/9. On Epo Reviewed stool occult- one was positive and one was negative, Hgb did not improve as much as expected with 1U  PRBCs on 12/25- CT abdomen ordered and was negative for acute bleed. Hgb reviewed 12/26 and improved. Heparin  decreased to q12H  12/27- Hb 7.8- about his normal last 7 days or so- running 7.0-8.0  -on Aranesp   FOBT ordered by nephrology-pending, will send message to nursing to check next BM- negative on 02/01/24  HGB 8.0 today 1/2 10. Leucocytosis: Has had leucocytosis which continues to fluctuate             --12.2 at admission    1/2 within normal limits at 10.0     Latest Ref Rng & Units 02/02/2024    5:16 AM 01/31/2024    5:32 AM 01/29/2024    5:13 AM  CBC  WBC 4.0 - 10.5 K/uL 10.0  9.9  10.2   Hemoglobin 13.0 - 17.0 g/dL 8.0  7.9  7.3   Hematocrit 39.0 - 52.0 % 25.0  24.6  22.8   Platelets 150 - 400 K/uL 328  330  358      11. Constipation: Change dulcolax to senna S 2 bid. LBM 12/25- d/c suppository  12/27- LBM yesterday- type 6  12/29-hard BM documented yesterday, add MiraLAX   12/30 LBM yesterday, monitor   12/31 increase miralax  to BID  1/3 LBM 2d ago but large one 1/1, feels this is normal, f/up outpatient 12.  Low protein stores: Albumin  reviewed and is improving, d/c Juven  13. Odynophagia: continue MMW, messaged nursing to see if this is still an issue  14. Class 2 Obesity: BMI- 37.4 on 12/20 Educate on diet and exercise to promote overall health and mobility.   12/28- BMI up to 40.48 today- large jump considering his fluid status/ESRD- will d/w renal  12/28- per pt and nursing, sticking to 1200cc/day of fluid restriction- in spite of Na down to 128  15. Atelectasis: continue flutter valve.  16. Hyponatremia  12/28- Na 128- running 128- 136 in last 1 week- will  d/w renal  12/28- renal felt pt might not be sticking to 1200cc/day- per staff and pt, he is- will recheck BMP today  17.  Sacral wound.  - Seen by WOC today.Recommending hydrotherapy with PT-Will message WOC regarding patient discharge tomorrow/recommendations.-Recommending surgical consult for  debridement-Will contact surgery regarding this and follow-up with outpatient wound care center-social work notified.   Addendum Spoke with surgery team-did not think surgical debridement beneficial at this time- f/u wound clinic.       LOS: 16 days A FACE TO FACE EVALUATION WAS PERFORMED  [address] 02/03/2024, 10:13 AM     "

## 2024-02-05 ENCOUNTER — Inpatient Hospital Stay: Admitting: Internal Medicine

## 2024-02-05 NOTE — Progress Notes (Addendum)
 Late note entry 1011am 1/5  D/c over weekend noted. [name] and anticipated arrival back to clinic this am. last nephrology note have been faxed over at this time. No d/c summary available, have requested this. Will fax once available.    Timothy Holloway Dialysis Navigator [phone]   Late note entry [phone] 1/6 D/c summary competed and faxed to [facility] at this time. No further support needed.

## 2024-02-05 NOTE — Progress Notes (Unsigned)
 Pt cancelled appt:

## 2024-02-05 NOTE — Telephone Encounter (Signed)
 Spoke with pt wife, aware of follow up appointment.

## 2024-02-05 NOTE — Progress Notes (Signed)
 Inpatient Rehabilitation Care Coordinator Discharge Note   Patient Details  Name: Timothy Holloway MRN: [id number] Date of Birth: March 02, 1955   Discharge location: Home with spouse  Length of Stay: 15 days  Discharge activity level: Independent with assistive device  Home/community participation: Active in the community  Patient response un:Yzjouy Literacy - How often do you need to have someone help you when you read instructions, pamphlets, or other written material from your doctor or pharmacy?: Never  Patient response un:Dnrpjo Isolation - How often do you feel lonely or isolated from those around you?: Never  Services provided included: SW, Pharmacy, TR, RN, CM, SLP, PT, OT, RD, MD  Financial Services:  Financial Services Utilized: Private Insurance [employer] / [employer] PPO  Choices offered to/list presented to: Patient/Spouse  Follow-up services arranged:  Home Health, DME Home Health Agency: [facility]    DME : Rollator, TTB    Patient response to transportation need: Is the patient able to respond to transportation needs?: Yes In the past 12 months, has lack of transportation kept you from medical appointments or from getting medications?: No In the past 12 months, has lack of transportation kept you from meetings, work, or from getting things needed for daily living?: No   Patient/Family verbalized understanding of follow-up arrangements:  Yes  Individual responsible for coordination of the follow-up plan: Patient/Spouse  Confirmed correct DME delivered: Timothy  Rollins Holloway 02/05/2024    Comments (or additional information): Patient's wife participated in family education and able to address 24/7 care needs. Patient refused TTB from [facility] and took bariatric rollator.   Summary of Stay    Date/Time Discharge Planning CSW  01/30/24 1426 Plans to discharge home with wife providing support and care. Lives close to [place] and [place].  Therapy recommending OP follow-up. DME ordered: Rollator, TTB. DS  01/30/24 1423 Plans to discharge home with wife providing support and care. Lives close to [place] and [place]. Therapy recommending OP follow-up. DS  01/30/24 0906 Plans to discharge home with wife providing support and care. Lives close to [place] and [place]. Therapy recommending OP follow-up DS  01/23/24 1029 Plans to discharge home with wife providing support and care. Lives close to [place] and [place].  Awaiting therapy follow up recommendations. DS       Timothy  Holloway

## 2024-02-06 ENCOUNTER — Encounter (HOSPITAL_COMMUNITY): Payer: Self-pay

## 2024-02-06 DIAGNOSIS — Z992 Dependence on renal dialysis: Secondary | ICD-10-CM

## 2024-02-06 DIAGNOSIS — I951 Orthostatic hypotension: Secondary | ICD-10-CM | POA: Insufficient documentation

## 2024-02-08 NOTE — Progress Notes (Signed)
 Patient ID: Timothy Holloway, male   DOB: 1955/04/04, 69 y.o.   MRN: [id number]  Patient's spouse [name] informed SW that [facility] had not followed up with them yet.   There was a change in staffing at [facility] and documents that were sent over on 02/02/2024 were not sent over.  SW faxed over orders and other documentation [facility] asked for when she called. It was faxed to [phone] - awaiting confirmation.

## 2024-02-09 ENCOUNTER — Ambulatory Visit (HOSPITAL_COMMUNITY)
Admission: RE | Admit: 2024-02-09 | Discharge: 2024-02-09 | Disposition: A | Source: Ambulatory Visit | Attending: Nurse Practitioner | Admitting: Nurse Practitioner

## 2024-02-09 ENCOUNTER — Other Ambulatory Visit (HOSPITAL_COMMUNITY): Payer: Self-pay | Admitting: Nurse Practitioner

## 2024-02-09 ENCOUNTER — Ambulatory Visit: Payer: Self-pay | Admitting: Thoracic Surgery (Cardiothoracic Vascular Surgery)

## 2024-02-09 DIAGNOSIS — R0989 Other specified symptoms and signs involving the circulatory and respiratory systems: Secondary | ICD-10-CM | POA: Diagnosis present

## 2024-02-12 NOTE — Progress Notes (Signed)
 Patient ID: DURANTE VIOLETT, male   DOB: 1955-02-07, 69 y.o.   MRN: [id number]  Reached out to [facility] to follow-up on referral that was sent on 1/02 - was re-directed to [facility]..The patient left the office before the visit was finished. A voicemail with him  Spoke with [name] to inform her that about the change in leadership at [facility] and informed her that another referral to [facility] will be sent as a back up. The referral included the patient's land line number for better contact.

## 2024-02-13 ENCOUNTER — Ambulatory Visit

## 2024-02-13 ENCOUNTER — Ambulatory Visit (HOSPITAL_COMMUNITY)
Admission: RE | Admit: 2024-02-13 | Discharge: 2024-02-13 | Disposition: A | Discharge status: Home / Self Care | Source: Ambulatory Visit | Attending: Vascular Surgery | Admitting: Vascular Surgery

## 2024-02-13 VITALS — BP 172/72 | HR 87 | Temp 97.8°F | Wt 285.3 lb

## 2024-02-13 DIAGNOSIS — Z9862 Peripheral vascular angioplasty status: Secondary | ICD-10-CM | POA: Insufficient documentation

## 2024-02-13 DIAGNOSIS — I6523 Occlusion and stenosis of bilateral carotid arteries: Secondary | ICD-10-CM | POA: Diagnosis present

## 2024-02-13 NOTE — Progress Notes (Signed)
" °  TCAR post operative follow up    CC:  follow up. Requesting Provider:  Baird Comer GAILS, NP  HPI: This is a 69 y.o. male here for follow up for carotid artery stenosis.  Pt is s/p left TCAR for symptomatic carotid artery stenosis on 01/10/2024 by Dr. Gretta.  He had difficulty with word finding and right sided weakness with known 80% left ICA stenosis.   He has history of diabetes, hypertension, hyperlipidemia, ESRD admitted with an NSTEMI now status post CABG.  He dialyzes via TDC.  He had ligation of fistula for steal syndrome.  He is catheter dependent for bilateral steal syndrome.   Pt returns today for follow up and here with his wife.    Pt denies any amaurosis fugax, speech difficulties, weakness, numbness, paralysis or clumsiness or facial droop.  His word finding has improved as well as his right sided weakness.  He denies any trouble swallowing.    His wife states they are still waiting on [facility] and PCP office is working on this.    The pt is on a statin for cholesterol management.  The pt is on a daily aspirin .   Other AC:  Plavix  The pt is not on medication for hypertension.   The pt is  on medication for diabetes Tobacco hx:  never   PHYSICAL EXAMINATION:  Today's Vitals   02/13/24 0912  BP: (!) 172/72  Pulse: 87  Temp: 97.8 F (36.6 C)  Weight: 285 lb 4.8 oz (129.4 kg)  PainSc: 0-No pain   Body mass index is 38.69 kg/m.   General:  WDWN in NAD; vital signs documented above Gait: Not observed HENT: WNL, normocephalic Pulmonary: normal non-labored breathing Cardiac: regular HR, left carotid bruit Skin: without rashes Vascular Exam/Pulses: Palpable radial pulses bilaterally Extremities: without open wounds Neurologic: A&O X 3; moving all extremities equally; speech is fluent/normal Psychiatric:  The pt has Normal affect.   Non-Invasive Vascular Imaging:   Carotid Duplex on 02/13/2024 Right:  patent stent Left:  40-59% ICA stenosis   Previous  Carotid duplex on 01/07/2024: Right: 40-59% ICA stenosis Left:   80-99% ICA stenosis    ASSESSMENT/PLAN:: 69 y.o. male here for follow up carotid artery stenosis and is s/p  left TCAR for symptomatic carotid artery stenosis on 01/10/2024 by Dr. Gretta.  He had difficulty with word finding and right sided weakness with known 80% left ICA stenosis.     -duplex today reveals the left carotid stent is patent.  Right ICA is 40-59%.  Pt's word finding and right sided weakness much improved.  -discussed s/s of stroke with pt and he understands should he develop any of these sx, he will go to the nearest ER or call 911. -pt will f/u in 9 months with carotid duplex -pt will call sooner should he have any issues. -continue statin/asa/plavix    Lucie Apt, [facility] [phone]  Clinic MD:  Gretta  "

## 2024-02-14 ENCOUNTER — Ambulatory Visit (HOSPITAL_COMMUNITY)

## 2024-02-14 ENCOUNTER — Telehealth: Payer: Self-pay | Admitting: Internal Medicine

## 2024-02-14 NOTE — Telephone Encounter (Signed)
 Spoke with Timothy Holloway. Saw pt last week and ordered a Chest xray. Impression was : Increased left basilar opacity is noted concerning for worsening atelectasis or infiltrate with possible small pleural effusion. Timothy Holloway would like Dr Okey to be aware. (Imaging 02/09/24)  Pt has appointment with [facility] on 02/27/24.

## 2024-02-14 NOTE — Telephone Encounter (Signed)
 Vernell, NP is requesting a callback regarding this pt having an abnormal chest x ray. Please advise

## 2024-02-15 ENCOUNTER — Ambulatory Visit: Admitting: Internal Medicine

## 2024-02-16 NOTE — Telephone Encounter (Signed)
 WOuld follow clinical presentation of patient   Not just CXR.   Can be added on to my schedule Mon or Tues

## 2024-02-19 NOTE — Telephone Encounter (Signed)
 Called and spoke to pt. He denies any Cardiology r/t symptoms. He states he just finished his antibiotics on 02/18/24.   Added him to Dr. Nada schedule for 02/20/24 at 11:00 am. Advised him that Dr. Okey will let him know if he needs to keep the 1/27 appt with Rana, NP.

## 2024-02-20 ENCOUNTER — Ambulatory Visit: Attending: Cardiology | Admitting: Internal Medicine

## 2024-02-20 VITALS — BP 110/60 | HR 83 | Ht 72.0 in | Wt 289.2 lb

## 2024-02-20 DIAGNOSIS — Z951 Presence of aortocoronary bypass graft: Secondary | ICD-10-CM

## 2024-02-20 NOTE — Progress Notes (Signed)
 "    Cardiology Office Note   Date:  02/20/2024   ID:  Timothy Holloway, DOB [birth year], MRN [id number]  PCP:  Baird Comer GAILS, NP  Cardiologist:   Vina Gull, MD   Pt presents for follow up of CAD      History of Present Illness: Timothy Holloway is a 69 y.o. male with a history of CAD, HTN, HL, DM, ESRD (on dialysis)     Dec 2025  Pt noted episodes of chest pressure with and without activity   Go away on own.   Attrib to GI Went to dialysis   Developed chest pressure/SOB  Sent to [facility] ER   I saw him in consult   Trop elevated at 1121   Pt transferred to [facility] and underwent LHC  Dec 2025  LHC  90% ostial/prx LAD; 90% prox LCx; 70% prox RCA  LVEF 30 to 35%     Jan 06, 2024  Pt underwent CABG (LIMA to LAD; SVG to OM; SVG to d RCA   Jan 09, 2024 pt had truple with words, R sided weakness  COde stroke called   Work up showed 995 L ICA  Pt underwent PTA/stent. Pt admitted to rehab service for inpatient rehab   Sent on home 02/03/24  Since D/C he is feeling some better   Denies CP/pressure    Breathing is OK   Dialsysis sessions are going good    No pallpitations    Active Medications[1]   Allergies:   Latex and Lodine [etodolac]   Past Medical History:  Diagnosis Date   Anemia    Cataract    Chronic kidney disease (CKD), stage III (moderate) (HCC)    dr Faythe   Depression    Diabetes mellitus without complication (HCC)    DVT (deep venous thrombosis) (HCC)    as a child   ESRD on dialysis [facility])    M-W-F   Hyperlipidemia    Hypertension    Impotence    Insomnia    Obesity    Steal syndrome as complication of dialysis access     Past Surgical History:  Procedure Laterality Date   A/V FISTULAGRAM Right 10/11/2022   Procedure: A/V Fistulagram;  Surgeon: Serene Gaile ORN, MD;  Location: [facility] INVASIVE CV LAB;  Service: Cardiovascular;  Laterality: Right;   AV FISTULA PLACEMENT Left 01/11/2022   Procedure: LEFT ARM ARTERIOVENOUS (AV) FISTULA CREATION;  Surgeon: Lanis Fonda BRAVO, MD;  Location: [facility] OR;  Service: Vascular;  Laterality: Left;  PERIPHERAL NERVE BLOCK   AV FISTULA PLACEMENT Right 07/26/2022   Procedure: RIGHT UPPER EXTREMITY ARTERIOVENOUS (AV) FISTULA CREATION;  Surgeon: Oris Krystal FALCON, MD;  Location: [facility] ORS;  Service: Vascular;  Laterality: Right;   CORONARY ARTERY BYPASS GRAFT N/A 01/08/2024   Procedure: CORONARY ARTERY BYPASS GRAFTING (CABG) TIMES 3 USING LEFT INTERNAL MAMMARY ARTERY AND ENDOSCOPICALLY HARVESTED RIGHT GREATER SAPHENOUS VEIN;  Surgeon: Shyrl Linnie KIDD, MD;  Location: [facility] OR;  Service: Open Heart Surgery;  Laterality: N/A;  second case   DIALYSIS/PERMA CATHETER INSERTION Right 10/23/2023   Procedure: DIALYSIS/PERMA CATHETER INSERTION;  Surgeon: Magda Debby SAILOR, MD;  Location: [facility] PV LAB;  Service: Cardiovascular;  Laterality: Right;   INSERTION OF DIALYSIS CATHETER Right 06/16/2022   Procedure: INSERTION OF RIGHT iNTERNAL Jugular TUNNELED DIALYSIS CATHETER;  Surgeon: Serene Gaile ORN, MD;  Location: [facility] OR;  Service: Vascular;  Laterality: Right;   KNEE SURGERY Left 01-31-2002   LEFT HEART CATH AND CORONARY ANGIOGRAPHY  N/A 01/05/2024   Procedure: LEFT HEART CATH AND CORONARY ANGIOGRAPHY;  Surgeon: Jordan, Peter M, MD;  Location: [facility] INVASIVE CV LAB;  Service: Cardiovascular;  Laterality: N/A;   LIGATION OF ARTERIOVENOUS  FISTULA Left 06/16/2022   Procedure: LEFT ARM FISTULA LIGATION;  Surgeon: Serene Gaile ORN, MD;  Location: [facility] OR;  Service: Vascular;  Laterality: Left;   LIGATION OF ARTERIOVENOUS  FISTULA Right 11/08/2022   Procedure: LIGATION of RIGHT RADIOCEPHALIC FISTULA;  Surgeon: Sheree Penne Bruckner, MD;  Location: [facility] OR;  Service: Vascular;  Laterality: Right;   SPINE SURGERY     fusion of L4-L5   TEE WITHOUT CARDIOVERSION N/A 01/08/2024   Procedure: ECHOCARDIOGRAM, TRANSESOPHAGEAL;  Surgeon: Shyrl Linnie KIDD, MD;  Location: [facility] OR;  Service: Open Heart Surgery;  Laterality: N/A;   TRANSCAROTID ARTERY REVASCULARIZATION   Left 01/10/2024   Procedure: TRANSCAROTID ARTERY REVASCULARIZATION (TCAR);  Surgeon: Gretta Bruckner PARAS, MD;  Location[facility] OR;  Service: Vascular;  Laterality: Left;   UPPER EXTREMITY ANGIOGRAPHY Right 11/03/2022   Procedure: Upper Extremity Angiography;  Surgeon: Gretta Bruckner PARAS, MD;  Location: [facility] INVASIVE CV LAB;  Service: Cardiovascular;  Laterality: Right;     Family History:  The patient's family history includes Cancer in his father; Diabetes in his mother.    ROS:  Please see the history of present illness. All other systems are reviewed and  Negative to the above problem except as noted.    PHYSICAL EXAM: VS:  BP 110/60 (Cuff Size: Large)   Pulse 83   Ht 6' (1.829 m)   Wt 289 lb 3.2 oz (131.2 kg)   SpO2 95%   BMI 39.22 kg/m   GEN: Obese 69 yo in no acute distress  HEENT: normal  Neck: no JVD, carotid bruits Cardiac: RRR; no murmur    Respiratory:  clear to auscultation bilaterally,  GI: soft, nontender, no masses  Ext  Tr LE edema  R greater than L       EKG:  EKG is not ordered today.   Lipid Panel    Component Value Date/Time   CHOL 88 01/06/2024 0417   CHOL 106 08/06/2012 0931   TRIG 103 01/14/2024 0431   TRIG 116 11/09/2012 0938   TRIG 92 08/06/2012 0931   HDL 34 (L) 01/06/2024 0417   HDL 35 (L) 11/09/2012 0938   HDL 40 08/06/2012 0931   CHOLHDL 2.6 01/06/2024 0417   VLDL 21 01/06/2024 0417   LDLCALC 33 01/06/2024 0417   LDLCALC 35 11/09/2012 0938   LDLCALC 48 08/06/2012 0931      Wt Readings from Last 3 Encounters:  02/20/24 289 lb 3.2 oz (131.2 kg)  02/13/24 285 lb 4.8 oz (129.4 kg)  02/03/24 281 lb 8.4 oz (127.7 kg)      ASSESSMENT AND PLAN:  1  CAD  Pt presented to [facility] in Dec 2025 with CP/NSTEMI   LHC showed severe 3 V CAD with LV dysfunctrion.   Pt underwent CABG    Post procedure complicated by CVA (see below)  Currently doing much better   Volume status is OK  BP is controlled   Keep on same meds   Keep on ASA and PLavix     Follow CBC   2  Hx HFrEF  Echo prior to surgery showed LVEF 40 to 45%    With renal dysfunction/dialyssi would not change meds now  Repeat echo at end of February to look for recovery  3  Neuro  s/p PTA/stent to L  ICA   Will need t obe followed over time   4  HL  LDL 33 HDL 34  Trig 103 In December   Keep on current meds   5  DM  Longstanding     A1C 9.9     Needs to be much lower   Working on diet  Discussed    Control per PCP  6  ESRD  Keep on dialyssi MWF   Current medicines are reviewed at length with the patient today.  The patient does not have concerns regarding medicines.  Signed, Vina Gull, MD        [1]  Current Meds  Medication Sig   acetaminophen  (TYLENOL ) 325 MG tablet Take 1-2 tablets (325-650 mg total) by mouth every 4 (four) hours as needed for mild pain (pain score 1-3).   albuterol  (PROVENTIL ) (2.5 MG/3ML) 0.083% nebulizer solution Take 3 mLs (2.5 mg total) by nebulization every 4 (four) hours as needed for wheezing or shortness of breath.   aspirin  EC 81 MG tablet Take 1 tablet (81 mg total) by mouth daily. Swallow whole.   atorvastatin  (LIPITOR) 80 MG tablet Take 1 tablet (80 mg total) by mouth daily.   cholecalciferol  (CHOLECALCIFEROL ) 25 MCG tablet Take 1 tablet (1,000 Units total) by mouth daily.   clopidogrel  (PLAVIX ) 75 MG tablet Take 1 tablet (75 mg total) by mouth daily.   Coenzyme Q10 (COQ-10) 30 MG CAPS daily.   collagenase  (SANTYL ) 250 UNIT/GM ointment Apply topically daily.   EPINEPHrine  0.3 mg/0.3 mL IJ SOAJ injection Inject 0.3 mg into the muscle as needed for anaphylaxis.   Glucosamine-Chondroitin 500-400 MG CAPS daily.   insulin  aspart (NOVOLOG ) 100 UNIT/ML injection Inject 5 Units into the skin 3 (three) times daily with meals.   insulin  glargine (LANTUS ) 100 UNIT/ML injection Inject 0.07 mLs (7 Units total) into the skin 2 (two) times daily.   magic mouthwash SOLN Take 5 mLs by mouth 4 (four) times daily. Suspension contains equal amounts  of Maalox Extra Strength, nystatin, and diphenhydramine .   meclizine (ANTIVERT) 25 MG tablet Take 25 mg by mouth daily as needed for dizziness.   multivitamin (RENA-VIT) TABS tablet Take 1 tablet by mouth at bedtime.   Nystatin (GERHARDT'S BUTT CREAM) CREA Apply 1 Application topically daily. Use zinc oxide mixed with nystatin cream.   Omega-3 Fatty Acids (FISH OIL) 1000 MG CAPS Take by mouth daily.   pantoprazole  (PROTONIX ) 40 MG tablet Take 1 tablet (40 mg total) by mouth daily.   sodium chloride  (OCEAN) 0.65 % SOLN nasal spray Place 1 spray into both nostrils daily as needed for congestion.   sucroferric oxyhydroxide (VELPHORO ) 500 MG chewable tablet Chew 1 tablet (500 mg total) by mouth 3 (three) times daily with meals.   traMADol  (ULTRAM ) 50 MG tablet Take 1 tablet (50 mg total) by mouth every 12 (twelve) hours as needed (mild pain, pain score 1-3).   zolpidem  (AMBIEN ) 5 MG tablet Take 1 tablet (5 mg total) by mouth at bedtime as needed for sleep.   "

## 2024-02-20 NOTE — Patient Instructions (Signed)
 Medication Instructions:  Your physician recommends that you continue on your current medications as directed. Please refer to the Current Medication list given to you today.  *If you need a refill on your cardiac medications before your next appointment, please call your pharmacy*  Lab Work: NONE  If you have labs (blood work) drawn today and your tests are completely normal, you will receive your results only by: MyChart Message (if you have MyChart) OR A paper copy in the mail If you have any lab test that is abnormal or we need to change your treatment, we will call you to review the results.  Testing/Procedures: LATE FEB- EARLY MARCH- - - Your physician has requested that you have an echocardiogram. Echocardiography is a painless test that uses sound waves to create images of your heart. It provides your doctor with information about the size and shape of your heart and how well your hearts chambers and valves are working. This procedure takes approximately one hour. There are no restrictions for this procedure. Please do NOT wear cologne, perfume, aftershave, or lotions (deodorant is allowed). Please arrive 15 minutes prior to your appointment time.  Please note: We ask at that you not bring children with you during ultrasound (echo/ vascular) testing. Due to room size and safety concerns, children are not allowed in the ultrasound rooms during exams. Our front office staff cannot provide observation of children in our lobby area while testing is being conducted. An adult accompanying a patient to their appointment will only be allowed in the ultrasound room at the discretion of the ultrasound technician under special circumstances. We apologize for any inconvenience.   Follow-Up: At [facility], you and your health needs are our priority.  As part of our continuing mission to provide you with exceptional heart care, our providers are all part of one team.  This team includes your  primary Cardiologist (physician) and Advanced Practice Providers or APPs (Physician Assistants and Nurse Practitioners) who all work together to provide you with the care you need, when you need it.  Your next appointment:   2 month(s)  Provider:   Vina Gull, MD    We recommend signing up for the patient portal called MyChart.  Sign up information is provided on this After Visit Summary.  MyChart is used to connect with patients for Virtual Visits (Telemedicine).  Patients are able to view lab/test results, encounter notes, upcoming appointments, etc.  Non-urgent messages can be sent to your provider as well.   To learn more about what you can do with MyChart, go to [email or web address].   Other Instructions

## 2024-02-22 ENCOUNTER — Encounter: Admitting: Physical Medicine and Rehabilitation

## 2024-02-22 ENCOUNTER — Encounter (HOSPITAL_BASED_OUTPATIENT_CLINIC_OR_DEPARTMENT_OTHER): Attending: Internal Medicine | Admitting: Internal Medicine

## 2024-02-22 DIAGNOSIS — N186 End stage renal disease: Secondary | ICD-10-CM | POA: Diagnosis not present

## 2024-02-22 DIAGNOSIS — E11622 Type 2 diabetes mellitus with other skin ulcer: Secondary | ICD-10-CM | POA: Insufficient documentation

## 2024-02-22 DIAGNOSIS — L89153 Pressure ulcer of sacral region, stage 3: Secondary | ICD-10-CM | POA: Insufficient documentation

## 2024-02-22 DIAGNOSIS — E1122 Type 2 diabetes mellitus with diabetic chronic kidney disease: Secondary | ICD-10-CM | POA: Diagnosis not present

## 2024-02-23 ENCOUNTER — Ambulatory Visit
Payer: Self-pay | Attending: Thoracic Surgery (Cardiothoracic Vascular Surgery) | Admitting: Thoracic Surgery (Cardiothoracic Vascular Surgery)

## 2024-02-23 DIAGNOSIS — Z951 Presence of aortocoronary bypass graft: Secondary | ICD-10-CM

## 2024-02-23 NOTE — Progress Notes (Signed)
" °   °  [address] [place] [address]             [phone]      Patient: Home Provider: Office Consent for Telemedicine visit obtained.  Todays visit was completed via a real-time telehealth (see specific modality noted below). The patient/authorized person provided oral consent at the time of the visit to engage in a telemedicine encounter with the present provider at [facility]. The patient/authorized person was informed of the potential benefits, limitations, and risks of telemedicine. The patient/authorized person expressed understanding that the laws that protect confidentiality also apply to telemedicine. The patient/authorized person acknowledged understanding that telemedicine does not provide emergency services and that he or she would need to call 911 or proceed to the nearest hospital for help if such a need arose.   Total time spent in the clinical discussion 10 minutes.  Telehealth Modality: Phone visit (audio only)  I had a telephone visit with  Timothy Holloway who is s/p CABG.  Overall doing well.  Pain is minimal.  Ambulating well. Vitals have been stable.  He was recently discharged from rehab at the beginning of January and has been home doing well  Timothy Holloway will see us  back in 2 weeks with a chest x-ray for cardiac rehab clearance.  [name] [name]  "

## 2024-02-27 ENCOUNTER — Encounter: Admitting: Physical Medicine and Rehabilitation

## 2024-02-27 ENCOUNTER — Other Ambulatory Visit: Payer: Self-pay

## 2024-02-27 ENCOUNTER — Ambulatory Visit: Admitting: Emergency Medicine

## 2024-02-27 NOTE — Progress Notes (Signed)
 Referral placed per Dr. Lang request to start outpatient Cardiac Rehab at [facility]. F/u in office with Manuelita Rough, PA on 03/07/24.

## 2024-03-06 ENCOUNTER — Other Ambulatory Visit: Payer: Self-pay

## 2024-03-06 DIAGNOSIS — Z951 Presence of aortocoronary bypass graft: Secondary | ICD-10-CM

## 2024-03-07 ENCOUNTER — Encounter (HOSPITAL_BASED_OUTPATIENT_CLINIC_OR_DEPARTMENT_OTHER): Admitting: Internal Medicine

## 2024-03-07 ENCOUNTER — Other Ambulatory Visit (HOSPITAL_COMMUNITY)

## 2024-03-07 ENCOUNTER — Inpatient Hospital Stay: Admitting: Adult Health

## 2024-03-07 ENCOUNTER — Ambulatory Visit: Payer: Self-pay

## 2024-03-08 ENCOUNTER — Telehealth (HOSPITAL_COMMUNITY): Payer: Self-pay

## 2024-03-08 NOTE — Telephone Encounter (Signed)
 Called patient regarding referral to cardiac rehab with NSTEMI/CABGx3 and spoke with patient's wife [name]. Patient has hemodialysis MWF and she is not sure if he will be able to participate at this time. He has his follow up with cardiothoracic surgery 2/10. We will wait for this consultation and clearance for CR. Patient is also waiting for [facility] PT consultation. I will contact patient after follow up.

## 2024-03-12 ENCOUNTER — Ambulatory Visit: Payer: Self-pay

## 2024-03-28 ENCOUNTER — Other Ambulatory Visit (HOSPITAL_COMMUNITY)

## 2024-04-04 ENCOUNTER — Inpatient Hospital Stay: Admitting: Adult Health

## 2024-05-09 ENCOUNTER — Ambulatory Visit: Admitting: Internal Medicine
